# Patient Record
Sex: Female | Born: 1935 | Hispanic: No | Marital: Married | Smoking: Never smoker
Health system: Southern US, Community
[De-identification: ages and names within clinical notes are randomized; demographics above are authoritative.]

## PROBLEM LIST (undated history)

## (undated) DIAGNOSIS — F0390 Unspecified dementia without behavioral disturbance: Secondary | ICD-10-CM

## (undated) DIAGNOSIS — I639 Cerebral infarction, unspecified: Secondary | ICD-10-CM

---

## 2010-07-14 ENCOUNTER — Emergency Department (HOSPITAL_COMMUNITY): Admission: EM | Admit: 2010-07-14 | Discharge: 2010-07-14 | Payer: Self-pay | Admitting: Family Medicine

## 2010-08-04 ENCOUNTER — Ambulatory Visit: Payer: Self-pay | Admitting: Internal Medicine

## 2010-08-04 DIAGNOSIS — M1990 Unspecified osteoarthritis, unspecified site: Secondary | ICD-10-CM

## 2010-08-04 HISTORY — DX: Unspecified osteoarthritis, unspecified site: M19.90

## 2010-08-04 LAB — CONVERTED CEMR LAB
ALT: 12 units/L (ref 0–35)
AST: 23 units/L (ref 0–37)
Albumin: 4 g/dL (ref 3.5–5.2)
Alkaline Phosphatase: 99 units/L (ref 39–117)
Basophils Absolute: 0.1 10*3/uL (ref 0.0–0.1)
Basophils Relative: 1 % (ref 0–1)
Blood in Urine, dipstick: NEGATIVE
Calcium: 9.9 mg/dL (ref 8.4–10.5)
Chloride: 107 meq/L (ref 96–112)
Eosinophils Absolute: 0.4 10*3/uL (ref 0.0–0.7)
Glucose, Urine, Semiquant: NEGATIVE
MCHC: 33.7 g/dL (ref 30.0–36.0)
MCV: 86.1 fL (ref 78.0–100.0)
Neutro Abs: 3.5 10*3/uL (ref 1.7–7.7)
Neutrophils Relative %: 52 % (ref 43–77)
Nitrite: NEGATIVE
Platelets: 269 10*3/uL (ref 150–400)
Potassium: 4.5 meq/L (ref 3.5–5.3)
Protein, U semiquant: NEGATIVE
RBC: 4.59 M/uL (ref 3.87–5.11)
RDW: 13.5 % (ref 11.5–15.5)
Sodium: 139 meq/L (ref 135–145)
Urobilinogen, UA: 0.2
pH: 6.5

## 2010-11-21 NOTE — Assessment & Plan Note (Signed)
Summary: NEW PT LEG PAIN///MC   Vital Signs:  Patient profile:   77 year old female Height:      55.5 inches Weight:      114 pounds Temp:     97.7 degrees F Pulse rate:   75 / minute Pulse rhythm:   regular Resp:     18 per minute BP sitting:   132 / 72  (left arm) Cuff size:   regular  Vitals Entered By: Michelle Nasuti (August 04, 2010 10:15 AM) CC: PT C/O BILATERAL ANKLE PAIN Pain Assessment Patient in pain? yes       Does patient need assistance? Ambulation Normal   CC:  PT C/O BILATERAL ANKLE PAIN.  History of Present Illness: 77 yo female here to establish.  Son interprets.  Concerns:  1.  Bilateral ankle pain:  Has had for 5 years.  No history of injury from what information can be gathered.  See below.  2.  Bilateral Knee Pain:  Also for past 5 years.  Again, no history of acute injury.  Both knees and ankles swell if she walks after a bit.  No erythema.  No other joints bother her.  Has been to Urgent Care--in September.  No xrays done.  Not clear what diagnosis was given.  Was intiated on Tramadol and Naproxen--helps.  Tramadol, however, causes nausea and vomiting--occurs with each dose.  Naproxen alone does not control her pain.  3.  Knot on her head:  Has had for many years--has not enlarged with time.    Current Medications (verified): 1)  Naproxen 500 Mg Tabs (Naproxen) .Marland Kitchen.. 1i Two Times A Day 2)  Tramadol Hcl 50 Mg Tabs (Tramadol Hcl) .Marland Kitchen.. 1 Tab By Mouth Three Times A Day  Allergies (verified): No Known Drug Allergies  Past History:  Past Surgical History: None  Family History: Mother, died 65 yo, febrile illness Father, died 63 yo--no known health concerns Siblings: 3 Sisters and 1 Brother, all living:  one sister with arthritis 7 children:  Son,  71:  Hypertension--he sees Jesse Fall, FNP  Social History: Originally from [place] Lived in [place] for 20 years [place]oved [place] Has not yet been to PHD from what son states--but waiting  to hear about her appt. 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 3 children still in [place], rest of children here in [place]. Tobacco Use:  Hx of smoking--stopped before coming to [place]  Started smoking when 77 yo.  3 cigarettes daily previously Alcohol use:  none Drug use:  none.  Physical Exam  General:  NAD Lungs:  Normal respiratory effort, chest expands symmetrically. Lungs are clear to auscultation, no crackles or wheezes. Heart:  Normal rate and regular rhythm. S1 and S2 normal without gallop, murmur, click, rub or other extra sounds.  Radial pulses normal and equal Abdomen:  soft and non-tender.   Extremities:  Varus deformity of bilateral knees with hypertrophic changes bilaterally  Mild antermedial joint line pain, just adjacent to patella on left.  No effusion.  Full ROM.  No ligamentous laxity  NT about ankles with full ROM and no effusion.   Impression & Recommendations:  Problem # 1:  OSTEOARTHRITIS (ICD-715.90) Stop Tramadol with GI upset. To take Naproxen with food. May take Tylenol in between doses for breakthrough pain The following medications were removed from the medication list:    Tramadol Hcl 50 Mg Tabs (Tramadol hcl) .Marland Kitchen... 1 tab by mouth three times  a day Her updated medication list for this problem includes:    Naproxen 500 Mg Tabs (Naproxen) .Marland Kitchen... 1 tab by mouth two times a day with food.  Problem # 2:  Preventive Health Care (ICD-V70.0) Flu vaccine  Complete Medication List: 1)  Naproxen 500 Mg Tabs (Naproxen) .Marland Kitchen.. 1 tab by mouth two times a day with food.  Other Orders: UA Dipstick w/o Micro (manual) (81191) T-Comprehensive Metabolic Panel [phone]) T-CBC No Diff ([id number]) Influenza Vaccine NON MCR (29528)  Patient Instructions: 1)  May take Tylenol 500 mg up to 3 times daily in between Naproxen if increased pain.  Should keep Tylenol 4 hours apart with dosing. 2)  CPP with Dr. Delrae Alfred in next 4  months Prescriptions: NAPROXEN 500 MG TABS (NAPROXEN) 1 tab by mouth two times a day with food.  #60 x 4   Entered and Authorized by:   Julieanne Manson MD   Signed by:   Julieanne Manson MD on 08/04/2010   Method used:   Electronically to        [facility] [phone]* (retail)       [address]       [place], [place]  [address]       Ph: [phone]       Fax: [phone]   RxID:   [id number]   Laboratory Results   Urine Tests  Date/Time Received: August 04, 2010 Date/Time Reported: 12:35 PM  Routine Urinalysis   Color: lt. yellow Appearance: Clear Glucose: negative   (Normal Range: Negative) Bilirubin: negative   (Normal Range: Negative) Ketone: negative   (Normal Range: Negative) Spec. Gravity: <1.005   (Normal Range: 1.003-1.035) Blood: negative   (Normal Range: Negative) pH: 6.5   (Normal Range: 5.0-8.0) Protein: negative   (Normal Range: Negative) Urobilinogen: 0.2   (Normal Range: 0-1) Nitrite: negative   (Normal Range: Negative) Leukocyte Esterace: negative   (Normal Range: Negative)         Influenza Vaccine    Vaccine Type: Fluvax Non-MCR    Site: right deltoid    Mfr: GlaxoSmithKline    Dose: 0.5 ml    Route: IM    Given by: Michelle Nasuti    Exp. Date: 04/21/2011    Lot #: [id number]    VIS given: 05/16/10 version given August 04, 2010.  Flu Vaccine Consent Questions    Do you have a history of severe allergic reactions to this vaccine? no    Any prior history of allergic reactions to egg and/or gelatin? no    Do you have a sensitivity to the preservative Thimersol? no    Do you have a past history of Guillan-Barre Syndrome? no    Do you currently have an acute febrile illness? no    Have you ever had a severe reaction to latex? no    Vaccine information given and explained to patient? yes    Are you currently pregnant? no

## 2011-02-24 ENCOUNTER — Inpatient Hospital Stay (INDEPENDENT_AMBULATORY_CARE_PROVIDER_SITE_OTHER)
Admission: RE | Admit: 2011-02-24 | Discharge: 2011-02-24 | Disposition: A | Discharge status: Home / Self Care | Payer: Medicaid Other | Source: Ambulatory Visit | Attending: Family Medicine | Admitting: Family Medicine

## 2011-02-24 DIAGNOSIS — M79609 Pain in unspecified limb: Secondary | ICD-10-CM

## 2011-11-23 ENCOUNTER — Emergency Department (INDEPENDENT_AMBULATORY_CARE_PROVIDER_SITE_OTHER)
Admission: EM | Admit: 2011-11-23 | Discharge: 2011-11-23 | Disposition: A | Discharge status: Home / Self Care | Payer: Medicaid Other | Source: Home / Self Care | Attending: Family Medicine | Admitting: Family Medicine

## 2011-11-23 ENCOUNTER — Emergency Department (INDEPENDENT_AMBULATORY_CARE_PROVIDER_SITE_OTHER): Payer: Medicaid Other

## 2011-11-23 ENCOUNTER — Encounter (HOSPITAL_COMMUNITY): Payer: Self-pay | Admitting: Emergency Medicine

## 2011-11-23 DIAGNOSIS — J069 Acute upper respiratory infection, unspecified: Secondary | ICD-10-CM

## 2011-11-23 DIAGNOSIS — M7551 Bursitis of right shoulder: Secondary | ICD-10-CM

## 2011-11-23 DIAGNOSIS — M67919 Unspecified disorder of synovium and tendon, unspecified shoulder: Secondary | ICD-10-CM

## 2011-11-23 DIAGNOSIS — M719 Bursopathy, unspecified: Secondary | ICD-10-CM

## 2011-11-23 MED ORDER — GUAIFENESIN-CODEINE 100-10 MG/5ML PO SYRP: 5.0000 mL | ORAL_SOLUTION | Freq: Three times a day (TID) | ORAL | Status: AC | PRN

## 2011-11-23 MED ORDER — DICLOFENAC SODIUM 1 % TD GEL: 1.0000 "application " | Freq: Four times a day (QID) | TRANSDERMAL | Status: DC

## 2011-11-23 NOTE — ED Provider Notes (Signed)
History     CSN: [id number]  Arrival date & time 11/23/11  1025   First MD Initiated Contact with Patient 11/23/11 1109      Chief Complaint  Patient presents with  . Cough  . Shoulder Pain    (Consider location/radiation/quality/duration/timing/severity/associated sxs/prior treatment) Patient is a 78 y.o. female presenting with cough and shoulder pain. The history is provided by the patient. The history is limited by a language barrier. A language interpreter was used.  Cough This is a new problem. The current episode started more than 1 week ago. The problem occurs constantly. The problem has not changed since onset.The cough is non-productive. There has been no fever. Associated symptoms include rhinorrhea, sore throat and myalgias. Pertinent negatives include no chest pain. She is not a smoker.  Shoulder Pain This is a chronic problem. The current episode started more than 1 week ago (5 months). The problem occurs constantly. The problem has not changed since onset.Pertinent negatives include no chest pain and no abdominal pain.    History reviewed. No pertinent past medical history.  History reviewed. No pertinent past surgical history.  No family history on file.  History  Substance Use Topics  . Smoking status: Never Smoker   . Smokeless tobacco: Not on file  . Alcohol Use: No    OB History    Grav Para Term Preterm Abortions TAB SAB Ect Mult Living                  Review of Systems  Constitutional: Negative.   HENT: Positive for congestion, sore throat, rhinorrhea and postnasal drip.   Respiratory: Positive for cough.   Cardiovascular: Negative for chest pain.  Gastrointestinal: Negative.  Negative for abdominal pain.  Musculoskeletal: Positive for myalgias and joint swelling.    Allergies  Review of patient's allergies indicates no known allergies.  Home Medications   Current Outpatient Rx  Name Route Sig Dispense Refill  . DICLOFENAC SODIUM 1 % TD  GEL Topical Apply 1 application topically 4 (four) times daily. 4gm per dose 100 g 3  . GUAIFENESIN-CODEINE 100-10 MG/5ML PO SYRP Oral Take 5 mLs by mouth 3 (three) times daily as needed for cough. 120 mL 0    BP 153/90  Pulse 79  Temp(Src) 98.1 F (36.7 C) (Oral)  Resp 20  SpO2 96%  Physical Exam  Nursing note and vitals reviewed. Constitutional: She is oriented to person, place, and time. She appears well-developed and well-nourished.  HENT:  Head: Normocephalic.  Right Ear: External ear normal.  Left Ear: External ear normal.  Mouth/Throat: Oropharynx is clear and moist.  Eyes: Pupils are equal, round, and reactive to light.  Neck: Normal range of motion. Neck supple.  Cardiovascular: Normal rate, regular rhythm, normal heart sounds and intact distal pulses.   Pulmonary/Chest: Effort normal and breath sounds normal.  Musculoskeletal: She exhibits tenderness. She exhibits no edema.       Right shoulder: She exhibits decreased range of motion and tenderness. She exhibits no swelling, no effusion and no crepitus.  Lymphadenopathy:    She has no cervical adenopathy.  Neurological: She is alert and oriented to person, place, and time.  Skin: Skin is warm and dry.    ED Course  Procedures (including critical care time)  Labs Reviewed - No data to display Dg Chest 2 View  11/23/2011  *RADIOLOGY REPORT*  Clinical Data: Cough for 1 month.  No shortness of breath or fever.  CHEST - 2 VIEW  Comparison: None.  Findings: Low volume chest.    No airspace disease or effusion identified.  No cavitary lesions are identified.  Tortuous thoracic aorta.  Cardiomegaly is present.  Age indeterminant thoracolumbar junction compression fracture identified on the lateral view. Per CMS PQRS reporting requirements (PQRS Measure 24): Given the patient's age of greater than 50 and the fracture site (hip, distal radius, or spine), the patient should be tested for osteoporosis using DXA, and the appropriate  treatment considered based on the DXA results.  IMPRESSION: Cardiomegaly and low volume chest.  No acute cardiopulmonary disease.  Original Report Authenticated By: Andreas Newport, M.D.   Dg Shoulder Right  11/23/2011  *RADIOLOGY REPORT*  Clinical Data: Right shoulder pain for 5 months.  No injury.  RIGHT SHOULDER - 2+ VIEW  Comparison: None.  Findings: Visualized right chest demonstrates basilar atelectasis. AC joint appears within normal limits.  Internal and external rotation views of the right shoulder appear normal.  The scapular Y view is oblique.  There is no fracture identified.  IMPRESSION: No acute osseous abnormality.  Original Report Authenticated By: Andreas Newport, M.D.     1. URI (upper respiratory infection)   2. Bursitis of shoulder, right       MDM  X-rays reviewed and report per radiologist.         Barkley Bruns, MD 11/23/11 [phone]

## 2011-11-23 NOTE — ED Notes (Signed)
Pt here with dry cough,chills and sore throat that started x 1 mnth and right shoulder pain x 5 mnths according to [place] translation line for [place] language.pt has been in [place] since June 2012

## 2012-01-01 ENCOUNTER — Emergency Department (HOSPITAL_COMMUNITY): Payer: Medicaid Other

## 2012-01-01 ENCOUNTER — Other Ambulatory Visit: Payer: Self-pay

## 2012-01-01 ENCOUNTER — Emergency Department (HOSPITAL_COMMUNITY)
Admission: EM | Admit: 2012-01-01 | Discharge: 2012-01-01 | Disposition: A | Discharge status: Home / Self Care | Payer: Medicaid Other | Attending: Emergency Medicine | Admitting: Emergency Medicine

## 2012-01-01 ENCOUNTER — Encounter (HOSPITAL_COMMUNITY): Payer: Self-pay | Admitting: Family Medicine

## 2012-01-01 DIAGNOSIS — W19XXXA Unspecified fall, initial encounter: Secondary | ICD-10-CM | POA: Insufficient documentation

## 2012-01-01 DIAGNOSIS — S20229A Contusion of unspecified back wall of thorax, initial encounter: Secondary | ICD-10-CM

## 2012-01-01 DIAGNOSIS — M549 Dorsalgia, unspecified: Secondary | ICD-10-CM | POA: Insufficient documentation

## 2012-01-01 DIAGNOSIS — J3489 Other specified disorders of nose and nasal sinuses: Secondary | ICD-10-CM | POA: Insufficient documentation

## 2012-01-01 DIAGNOSIS — Z79899 Other long term (current) drug therapy: Secondary | ICD-10-CM | POA: Insufficient documentation

## 2012-01-01 DIAGNOSIS — N390 Urinary tract infection, site not specified: Secondary | ICD-10-CM | POA: Insufficient documentation

## 2012-01-01 DIAGNOSIS — R229 Localized swelling, mass and lump, unspecified: Secondary | ICD-10-CM | POA: Insufficient documentation

## 2012-01-01 DIAGNOSIS — R55 Syncope and collapse: Secondary | ICD-10-CM | POA: Insufficient documentation

## 2012-01-01 DIAGNOSIS — S5000XA Contusion of unspecified elbow, initial encounter: Secondary | ICD-10-CM | POA: Insufficient documentation

## 2012-01-01 LAB — URINALYSIS, ROUTINE W REFLEX MICROSCOPIC
Bilirubin Urine: NEGATIVE
Glucose, UA: NEGATIVE mg/dL
Hgb urine dipstick: NEGATIVE
Ketones, ur: NEGATIVE mg/dL
Nitrite: NEGATIVE
Protein, ur: 30 mg/dL — AB
Specific Gravity, Urine: 1.01 (ref 1.005–1.030)
Urobilinogen, UA: 0.2 mg/dL (ref 0.0–1.0)
pH: 6.5 (ref 5.0–8.0)

## 2012-01-01 LAB — URINE MICROSCOPIC-ADD ON

## 2012-01-01 MED ORDER — OXYCODONE-ACETAMINOPHEN 5-325 MG PO TABS
2.0000 | ORAL_TABLET | Freq: Once | ORAL | Status: AC
  Administered 2012-01-01: 2 via ORAL
  Filled 2012-01-01: qty 2

## 2012-01-01 MED ORDER — NITROFURANTOIN MONOHYD MACRO 100 MG PO CAPS
100.0000 mg | ORAL_CAPSULE | Freq: Once | ORAL | Status: AC
  Administered 2012-01-01: 100 mg via ORAL
  Filled 2012-01-01: qty 1

## 2012-01-01 MED ORDER — NITROFURANTOIN MONOHYD MACRO 100 MG PO CAPS: 100.0000 mg | ORAL_CAPSULE | Freq: Two times a day (BID) | ORAL | Status: AC

## 2012-01-01 MED ORDER — SODIUM CHLORIDE 0.9 % IV SOLN
INTRAVENOUS | Status: DC
  Administered 2012-01-01: 11:00:00 via INTRAVENOUS

## 2012-01-01 MED ORDER — OXYCODONE-ACETAMINOPHEN 5-325 MG PO TABS: 1.0000 | ORAL_TABLET | Freq: Four times a day (QID) | ORAL | Status: AC | PRN

## 2012-01-01 MED ORDER — MORPHINE SULFATE 4 MG/ML IJ SOLN
4.0000 mg | Freq: Once | INTRAMUSCULAR | Status: AC
  Administered 2012-01-01: 4 mg via INTRAVENOUS
  Filled 2012-01-01: qty 1

## 2012-01-01 MED ORDER — NAPROXEN 500 MG PO TABS: 500.0000 mg | ORAL_TABLET | Freq: Two times a day (BID) | ORAL | Status: AC

## 2012-01-01 NOTE — ED Notes (Signed)
Placed on  bedpan

## 2012-01-01 NOTE — ED Provider Notes (Signed)
History     CSN: [id number]  Arrival date & time 01/01/12  7846   First MD Initiated Contact with Patient 01/01/12 1000      Chief Complaint  Patient presents with  . Fall    (Consider location/radiation/quality/duration/timing/severity/associated sxs/prior treatment) HPI Comments: Also a nonthrombosed. Sounds like it was a mechanical fall. There was question as to whether she had some mild near-syncope and mild headache prior to fall. No chest pain, shortness breath, palpitations.  Patient is a 78 y.o. female presenting with fall. The history is provided by the patient. The history is limited by a language barrier. A language interpreter was used.  Fall The accident occurred less than 1 hour ago. The fall occurred while walking (boarding a bus). She fell from a height of 1 to 2 ft. She landed on concrete. There was no blood loss. The point of impact was the right elbow (back). Pain location: back and R elbow. The pain is moderate. She was ambulatory at the scene. There was no entrapment after the fall. There was no drug use involved in the accident. There was no alcohol use involved in the accident. Pertinent negatives include no visual change, no fever, no numbness, no abdominal pain, no bowel incontinence, no nausea, no vomiting, no headaches and no loss of consciousness. Treatment on scene includes a backboard.    History reviewed. No pertinent past medical history.  History reviewed. No pertinent past surgical history.  History reviewed. No pertinent family history.  History  Substance Use Topics  . Smoking status: Never Smoker   . Smokeless tobacco: Not on file  . Alcohol Use: No    OB History    Grav Para Term Preterm Abortions TAB SAB Ect Mult Living                  Review of Systems  Constitutional: Negative for fever, activity change, appetite change and fatigue.  HENT: Negative for congestion, sore throat, rhinorrhea, neck pain and neck stiffness.     Respiratory: Negative for cough and shortness of breath.   Cardiovascular: Negative for chest pain and palpitations.  Gastrointestinal: Negative for nausea, vomiting, abdominal pain and bowel incontinence.  Genitourinary: Negative for dysuria, urgency, frequency and flank pain.  Musculoskeletal: Positive for back pain and arthralgias. Negative for myalgias.  Neurological: Negative for dizziness, loss of consciousness, syncope, weakness, light-headedness, numbness and headaches.  All other systems reviewed and are negative.    Allergies  Review of patient's allergies indicates no known allergies.  Home Medications   Current Outpatient Rx  Name Route Sig Dispense Refill  . DICLOFENAC SODIUM 1 % TD GEL Topical Apply 1 application topically 4 (four) times daily. 4gm per dose 100 g 3  . NAPROXEN 500 MG PO TABS Oral Take 1 tablet (500 mg total) by mouth 2 (two) times daily. 30 tablet 0  . NITROFURANTOIN MONOHYD MACRO 100 MG PO CAPS Oral Take 1 capsule (100 mg total) by mouth 2 (two) times daily. 10 capsule 0  . OXYCODONE-ACETAMINOPHEN 5-325 MG PO TABS Oral Take 1-2 tablets by mouth every 6 (six) hours as needed for pain. 15 tablet 0    BP 143/77  Pulse 79  Temp(Src) 98.3 F (36.8 C) (Oral)  Resp 24  SpO2 96%  Physical Exam  Nursing note and vitals reviewed. Constitutional: She is oriented to person, place, and time. She appears well-developed and well-nourished.       Appears uncomfortable  HENT:  Head: Normocephalic and atraumatic.  Mouth/Throat: Oropharynx is clear and moist.  Eyes: Conjunctivae and EOM are normal. Pupils are equal, round, and reactive to light.  Neck: Normal range of motion. Neck supple.  Cardiovascular: Normal rate, regular rhythm, normal heart sounds and intact distal pulses.  Exam reveals no gallop and no friction rub.   No murmur heard. Pulmonary/Chest: Effort normal and breath sounds normal. No respiratory distress. She exhibits no tenderness.   Abdominal: Soft. Bowel sounds are normal. There is no tenderness. There is no rebound and no guarding.  Musculoskeletal:       Right elbow: She exhibits decreased range of motion. She exhibits no swelling and no deformity. tenderness (diffusely) found.       Thoracic back: She exhibits tenderness, bony tenderness and pain. She exhibits no spasm.       Lumbar back: She exhibits tenderness, bony tenderness and pain. She exhibits no spasm.       Full rom distally.  Strength 5/5 in all ext.  Reduced rom R elbow.  Strong radial pulses  Neurological: She is alert and oriented to person, place, and time. No cranial nerve deficit.  Skin: Skin is warm and dry. No rash noted.    ED Course  Procedures (including critical care time)   Date: 01/01/2012  Rate: 68  Rhythm: normal sinus rhythm  QRS Axis: normal  Intervals: normal  ST/T Wave abnormalities: normal  Conduction Disutrbances:none  Narrative Interpretation:   Old EKG Reviewed: none available  Labs Reviewed  URINALYSIS, ROUTINE W REFLEX MICROSCOPIC - Abnormal; Notable for the following:    Protein, ur 30 (*)    Leukocytes, UA LARGE (*)    All other components within normal limits  URINE MICROSCOPIC-ADD ON - Abnormal; Notable for the following:    Squamous Epithelial / LPF FEW (*)    Bacteria, UA FEW (*)    All other components within normal limits   Dg Thoracic Spine 2 View  01/01/2012  *RADIOLOGY REPORT*  Clinical Data: Larey Seat.  No reported spine symptoms.  THORACIC SPINE - 2 VIEW  Comparison: Chest dated 11/23/2011.  Findings: Stable 50% T12 vertebral compression deformity.  No acute fracture or subluxation.  Diffuse osteopenia.  IMPRESSION: Stable 50% T12 compression deformity.  No acute fracture.  Per CMS PQRS reporting requirements (PQRS Measure 24): Given the patient's age of greater than 50 and the fracture site (hip, distal radius, or spine), the patient should be tested for osteoporosis using DXA, and the appropriate treatment  considered based on the DXA results.  Original Report Authenticated By: Darrol Angel, M.D.   Dg Lumbar Spine Complete  01/01/2012  *RADIOLOGY REPORT*  Clinical Data: Larey Seat.  No reported spine symptoms.  LUMBAR SPINE - COMPLETE 4+ VIEW  Comparison: Thoracic spine radiographs obtained at the same time.  Findings: Five non-rib bearing lumbar vertebrae.  Minimal dextroconvex rotary scoliosis.  Previously described old T12 compression deformity.  No acute fracture or subluxation.  Mild spur formation at multiple levels.  Atheromatous aortic calcifications.  IMPRESSION:  1.  Previously described old T12 compression deformity. 2.  No acute fracture or subluxation. 3.  Mild degenerative changes.  Original Report Authenticated By: Darrol Angel, M.D.   Dg Elbow Complete Right  01/01/2012  *RADIOLOGY REPORT*  Clinical Data: Fall  RIGHT ELBOW - COMPLETE 3+ VIEW  Comparison: 11/23/2011  Findings: There is no joint effusion.  There is no fracture or subluxation identified.  There are no radio-opaque foreign bodies or soft tissue calcification identified.  No significant arthropathy.  IMPRESSION:  1.  No acute findings.  Original Report Authenticated By: Rosealee Albee, M.D.   Ct Head Wo Contrast  01/01/2012  *RADIOLOGY REPORT*  Clinical Data: Status post fall  CT HEAD WITHOUT CONTRAST  Technique:  Contiguous axial images were obtained from the base of the skull through the vertex without contrast.  Comparison: None  Findings: The brain has a normal appearance without evidence for hemorrhage, infarction, hydrocephalus, or mass lesion.  There is no extra axial fluid collection.  Bubbly opacification of the sphenoid sinus is identified.  The mastoid air cells are clear. The skull is intact.  Within the right scalp there is a subcutaneous nodule which is benign appearing and partially calcified measuring 1 cm, image 21.  IMPRESSION:  1.  No acute intracranial abnormalities. 2.  Mild sphenoid sinus opacification.   Original Report Authenticated By: Rosealee Albee, M.D.     1. UTI (lower urinary tract infection)   2. Fall   3. Elbow contusion   4. Back contusion       MDM  Imaging negative. Consistent with a contusion to the back. She has an old T12 deformity nothing acute. Elbows negative for fracture. Improved after pain medication. Mild urinary tract infection. Will be discharged home with instructions to followup with her primary care physician. Discharged home with Macrobid and pain control. Given apply ice the affected areas.  Provided signs for return        Dayton Bailiff, MD 01/01/12 1420

## 2012-01-01 NOTE — Progress Notes (Signed)
No pcp listed spoke with pt's female family member at bedside.  No listed PCP on pt Medicaid card.  Pt unable to recall name of pcp seen.  Cm discussed importance of pcp with need to have DSS assist with entering chosen pcp in Medicaid system so pt can be seen Female voiced understanding provided with a list of medicaid local pcps

## 2012-01-01 NOTE — ED Notes (Signed)
Off floor for testing 

## 2012-01-01 NOTE — ED Notes (Signed)
Family at bedside for interpretation

## 2012-01-01 NOTE — ED Notes (Signed)
Followed up with patient. Patient still unable to void. RN Stacy aware.

## 2012-01-01 NOTE — ED Notes (Addendum)
Per EMS: Pt was getting off the bus and slipped and fell onto the concrete.  Denies loc. Reports right hand and back pain. Pt on LSB and head blocks. EMS states they could not find a c-collar that would fit the pt.  NAD noted

## 2012-01-01 NOTE — Discharge Instructions (Signed)

## 2012-01-01 NOTE — ED Notes (Signed)
Pt. and family aware need urine sample. Unable to void at this time; advised to call once able to.

## 2012-01-01 NOTE — ED Notes (Signed)
AVW:[id number]<XB> Expected date:01/01/12<BR> Expected time: 9:51 AM<BR> Means of arrival:Ambulance<BR> Comments:<BR> Fall

## 2012-04-06 ENCOUNTER — Encounter (HOSPITAL_COMMUNITY): Payer: Self-pay | Admitting: *Deleted

## 2012-04-06 ENCOUNTER — Emergency Department (HOSPITAL_COMMUNITY)
Admission: EM | Admit: 2012-04-06 | Discharge: 2012-04-06 | Disposition: A | Discharge status: Home / Self Care | Payer: Medicaid Other | Source: Home / Self Care | Attending: Family Medicine | Admitting: Family Medicine

## 2012-04-06 ENCOUNTER — Emergency Department (INDEPENDENT_AMBULATORY_CARE_PROVIDER_SITE_OTHER): Payer: Medicaid Other

## 2012-04-06 DIAGNOSIS — J069 Acute upper respiratory infection, unspecified: Secondary | ICD-10-CM

## 2012-04-06 LAB — POCT RAPID STREP A: Streptococcus, Group A Screen (Direct): NEGATIVE

## 2012-04-06 MED ORDER — BENZONATATE 100 MG PO CAPS: 100.0000 mg | ORAL_CAPSULE | Freq: Three times a day (TID) | ORAL | Status: AC

## 2012-04-06 MED ORDER — GUAIFENESIN-CODEINE 400-10 MG PO TABS: 1.0000 | ORAL_TABLET | Freq: Four times a day (QID) | ORAL | Status: DC | PRN

## 2012-04-06 NOTE — Discharge Instructions (Signed)
No signs of pneumonia in the x-rays. Rapid strep test is negative. Is likely you have a viral respiratory infection. Is very important top keep well hydrated. Take the prescribed medications as instructed. Take ibuprofen over-the-counter 200 mg scheduled every 8 hours for the next 24-48 hours take with food and plenty of liquids as it can upset your stomach, can Tylenol over-the-counter 500 mg every 6 hours as needed for fever or pain Use nasal saline spray at least 3 times a day. (simply saline is over the counter) Return if difficulty breathing or not keeping fluids down.

## 2012-04-06 NOTE — ED Notes (Signed)
TRANSLATED PER SON, PT CO COUGH WITH HA, SORETHROAT SINCE YESTERDAY, DENIES FEVER, HAS NOT TAKEN ANY OTC MEDS.

## 2012-04-06 NOTE — ED Provider Notes (Signed)
History     CSN: [id number]  Arrival date & time 04/06/12  1525   First MD Initiated Contact with Patient 04/06/12 1543      Chief Complaint  Patient presents with  . Cough    (Consider location/radiation/quality/duration/timing/severity/associated sxs/prior treatment) HPI Comments: 78 y/o female no significant PMH here c/o persistent cough, nasal congestion, sore throat , headache and general malaise for 2 days. Subjective fever. Denies shortness of breath or chest pain. Denies chills or rigors. No nausea vomiting or diarrhea. Decreased appetite but keeping fluids well.   History reviewed. No pertinent past medical history.  History reviewed. No pertinent past surgical history.  History reviewed. No pertinent family history.  History  Substance Use Topics  . Smoking status: Never Smoker   . Smokeless tobacco: Not on file  . Alcohol Use: No    OB History    Grav Para Term Preterm Abortions TAB SAB Ect Mult Living                  Review of Systems  Constitutional: Positive for fever and appetite change. Negative for chills.  HENT: Positive for sore throat and rhinorrhea. Negative for ear pain, trouble swallowing, neck pain and neck stiffness.   Respiratory: Positive for cough. Negative for shortness of breath.   Cardiovascular: Negative for chest pain and leg swelling.  Gastrointestinal: Negative for nausea, vomiting, abdominal pain and diarrhea.  Genitourinary: Negative for dysuria.  Skin: Negative for rash.  Neurological: Negative for dizziness and headaches.    Allergies  Review of patient's allergies indicates no known allergies.  Home Medications   Current Outpatient Rx  Name Route Sig Dispense Refill  . BENZONATATE 100 MG PO CAPS Oral Take 1 capsule (100 mg total) by mouth every 8 (eight) hours. 21 capsule 0  . DICLOFENAC SODIUM 1 % TD GEL Topical Apply 1 application topically 4 (four) times daily. 4gm per dose 100 g 3  . GUAIFENESIN-CODEINE 400-10 MG PO  TABS Oral Take 1 tablet by mouth 4 (four) times daily as needed. 20 each 0  . NAPROXEN 500 MG PO TABS Oral Take 1 tablet (500 mg total) by mouth 2 (two) times daily. 30 tablet 0    BP 140/93  Pulse 108  Temp 99.5 F (37.5 C) (Oral)  Resp 18  SpO2 97%  Physical Exam  Nursing note and vitals reviewed. Constitutional: She is oriented to person, place, and time. She appears well-developed and well-nourished. No distress.  HENT:  Head: Normocephalic and atraumatic.  Right Ear: External ear normal.  Left Ear: External ear normal.       Nasal Congestion with erythema and swelling of nasal turbinates, clear rhinorrhea. Pharyngeal erythema no exudates. No uvula deviation. No trismus. TM's with increased vascular markings and some dullness bilaterally no swelling or bulging   Eyes: Conjunctivae and EOM are normal. Pupils are equal, round, and reactive to light. Right eye exhibits no discharge. Left eye exhibits no discharge.  Neck: Normal range of motion. Neck supple. No JVD present.  Cardiovascular: Normal rate, regular rhythm and normal heart sounds.  Exam reveals no friction rub.   No murmur heard. Pulmonary/Chest: Effort normal. No respiratory distress. She has no wheezes. She has no rales. She exhibits no tenderness.       Decreased breath sound at bases bilaterally.  Lymphadenopathy:    She has no cervical adenopathy.  Neurological: She is alert and oriented to person, place, and time.  Skin: No rash noted.  ED Course  Procedures (including critical care time)   Labs Reviewed  POCT RAPID STREP A ([facility] CARE ONLY)  LAB REPORT - SCANNED   Dg Chest 2 View  04/06/2012  *RADIOLOGY REPORT*  Clinical Data: History of cough.  CHEST - 2 VIEW  Comparison: Chest x-ray 11/23/2011.  Findings: Lung volumes are low.  There are bibasilar opacities favored to represent areas of subsegmental atelectasis.  No definite acute consolidative airspace disease.  No pleural effusions.  Mild diffuse  interstitial prominence is similar to the prior study appears to be chronic in this patient.  Mild pulmonary vascular crowding, accentuated by the patient's low lung volumes. No frank pulmonary edema.  Heart size is borderline enlarged. Mediastinal contours are unremarkable.  Atherosclerotic calcifications within the arch of the aorta.  Lateral view again demonstrates two lower thoracic spine compression fractures (likely T11 and T12), which appear unchanged compared to prior study from 11/23/2011.  This is associated with a mild acute kyphotic deformity of the lower thoracic spine.  IMPRESSION: 1.  Low lung volumes with bibasilar subsegmental atelectasis. 2.  Borderline cardiomegaly. 3.  Atherosclerosis. 4.  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  Original Report Authenticated By: Florencia Reasons, M.D.     1. URI, acute       MDM  Rapid strep negative. No signs of consolidation on X rays. Treated symptomatically. Asked to return if worsening or persistent symptoms despite following treatment.          Sharin Grave, MD 04/07/12 1056

## 2019-04-04 DIAGNOSIS — R058 Other specified cough: Secondary | ICD-10-CM | POA: Insufficient documentation

## 2019-04-04 DIAGNOSIS — J4520 Mild intermittent asthma, uncomplicated: Secondary | ICD-10-CM

## 2019-04-04 HISTORY — DX: Mild intermittent asthma, uncomplicated: J45.20

## 2020-03-02 ENCOUNTER — Ambulatory Visit: Payer: Medicare Other

## 2020-03-02 NOTE — Progress Notes (Signed)
   Covid-19 Vaccination Clinic  Name:  Nikitia Asbill    MRN: [id number] DOB: 10-Dec-1932  03/02/2020  Ms. Santiago was observed post Covid-19 immunization for 15 minutes without incident. She was provided with Vaccine Information Sheet and instruction to access the V-Safe system.   Ms. Teagarden was instructed to call 911 with any severe reactions post vaccine: Marland Kitchen Difficulty breathing  . Swelling of face and throat  . A fast heartbeat  . A bad rash all over body  . Dizziness and weakness   Immunizations Administered    Name Date Dose VIS Date Route   Pfizer COVID-19 Vaccine 03/02/2020 12:24 PM 0.3 mL 12/16/2018 Intramuscular   Manufacturer: [employer], [employer]   Lot: [id number]   NDC: [address]-4

## 2020-03-28 ENCOUNTER — Ambulatory Visit: Payer: Medicare Other | Attending: Internal Medicine

## 2020-03-28 DIAGNOSIS — Z23 Encounter for immunization: Secondary | ICD-10-CM

## 2020-03-28 NOTE — Progress Notes (Signed)
   Covid-19 Vaccination Clinic  Name:  Yolanda [name]    MRN: [id number] DOB: 24-Sep-1933  03/28/2020  Yolanda [name] was observed post Covid-19 immunization for 15 minutes without incident. She was provided with Vaccine Information Sheet and instruction to access the V-Safe system.   Yolanda [name] was instructed to call 911 with any severe reactions post vaccine: Marland Kitchen Difficulty breathing  . Swelling of face and throat  . A fast heartbeat  . A bad rash all over body  . Dizziness and weakness   Immunizations Administered    Name Date Dose VIS Date Route   Pfizer COVID-19 Vaccine 03/28/2020  1:59 PM 0.3 mL 12/16/2018 Intramuscular   Manufacturer: [employer], [employer]   Lot: [id number]   NDC: [address]-7

## 2020-10-31 DIAGNOSIS — S52502D Unspecified fracture of the lower end of left radius, subsequent encounter for closed fracture with routine healing: Secondary | ICD-10-CM

## 2020-10-31 HISTORY — DX: Unspecified fracture of the lower end of left radius, subsequent encounter for closed fracture with routine healing: S52.502D

## 2021-03-20 ENCOUNTER — Emergency Department (HOSPITAL_COMMUNITY): Payer: Medicare Other

## 2021-03-20 ENCOUNTER — Emergency Department (HOSPITAL_COMMUNITY)
Admission: EM | Admit: 2021-03-20 | Discharge: 2021-03-21 | Disposition: A | Discharge status: Home / Self Care | Payer: Medicare Other | Attending: Emergency Medicine | Admitting: Emergency Medicine

## 2021-03-20 ENCOUNTER — Encounter (HOSPITAL_COMMUNITY): Payer: Self-pay

## 2021-03-20 ENCOUNTER — Other Ambulatory Visit: Payer: Self-pay

## 2021-03-20 DIAGNOSIS — F0390 Unspecified dementia without behavioral disturbance: Secondary | ICD-10-CM | POA: Diagnosis not present

## 2021-03-20 DIAGNOSIS — S42212A Unspecified displaced fracture of surgical neck of left humerus, initial encounter for closed fracture: Secondary | ICD-10-CM | POA: Diagnosis not present

## 2021-03-20 DIAGNOSIS — Y92009 Unspecified place in unspecified non-institutional (private) residence as the place of occurrence of the external cause: Secondary | ICD-10-CM | POA: Insufficient documentation

## 2021-03-20 DIAGNOSIS — W108XXA Fall (on) (from) other stairs and steps, initial encounter: Secondary | ICD-10-CM | POA: Diagnosis not present

## 2021-03-20 DIAGNOSIS — I671 Cerebral aneurysm, nonruptured: Secondary | ICD-10-CM | POA: Diagnosis not present

## 2021-03-20 DIAGNOSIS — W19XXXA Unspecified fall, initial encounter: Secondary | ICD-10-CM

## 2021-03-20 DIAGNOSIS — S0083XA Contusion of other part of head, initial encounter: Secondary | ICD-10-CM | POA: Insufficient documentation

## 2021-03-20 DIAGNOSIS — S42202A Unspecified fracture of upper end of left humerus, initial encounter for closed fracture: Secondary | ICD-10-CM

## 2021-03-20 DIAGNOSIS — S0990XA Unspecified injury of head, initial encounter: Secondary | ICD-10-CM | POA: Diagnosis present

## 2021-03-20 HISTORY — DX: Unspecified dementia, unspecified severity, without behavioral disturbance, psychotic disturbance, mood disturbance, and anxiety: F03.90

## 2021-03-20 LAB — CBC WITH DIFFERENTIAL/PLATELET
Abs Immature Granulocytes: 0.05 10*3/uL (ref 0.00–0.07)
Basophils Absolute: 0 10*3/uL (ref 0.0–0.1)
Basophils Relative: 1 %
Eosinophils Absolute: 0.4 10*3/uL (ref 0.0–0.5)
Eosinophils Relative: 5 %
HCT: 42.4 % (ref 36.0–46.0)
Hemoglobin: 13.8 g/dL (ref 12.0–15.0)
Immature Granulocytes: 1 %
Lymphocytes Relative: 23 %
Lymphs Abs: 1.9 10*3/uL (ref 0.7–4.0)
MCH: 29 pg (ref 26.0–34.0)
MCHC: 32.5 g/dL (ref 30.0–36.0)
MCV: 89.1 fL (ref 80.0–100.0)
Monocytes Absolute: 0.6 10*3/uL (ref 0.1–1.0)
Monocytes Relative: 7 %
Neutro Abs: 5.5 10*3/uL (ref 1.7–7.7)
Neutrophils Relative %: 63 %
Platelets: 194 10*3/uL (ref 150–400)
RBC: 4.76 MIL/uL (ref 3.87–5.11)
RDW: 13.5 % (ref 11.5–15.5)
WBC: 8.5 10*3/uL (ref 4.0–10.5)
nRBC: 0 % (ref 0.0–0.2)

## 2021-03-20 LAB — BASIC METABOLIC PANEL
Anion gap: 8 (ref 5–15)
BUN: 11 mg/dL (ref 8–23)
CO2: 24 mmol/L (ref 22–32)
Calcium: 9.3 mg/dL (ref 8.9–10.3)
Chloride: 104 mmol/L (ref 98–111)
Creatinine, Ser: 0.76 mg/dL (ref 0.44–1.00)
GFR, Estimated: >60 mL/min (ref >60)
Glucose, Bld: 113 mg/dL — ABNORMAL HIGH (ref 70–99)
Potassium: 3.9 mmol/L (ref 3.5–5.1)
Sodium: 136 mmol/L (ref 135–145)

## 2021-03-20 MED ORDER — FENTANYL CITRATE (PF) 100 MCG/2ML IJ SOLN
50.0000 ug | Freq: Once | INTRAMUSCULAR | Status: AC
  Administered 2021-03-21: 50 ug via INTRAVENOUS
  Filled 2021-03-20: qty 2

## 2021-03-20 NOTE — ED Notes (Signed)
Ortho tech called and paged several time by this RN and Diplomatic Services operational officer, family made aware pending discharge post sling placement with verbalized understanding.

## 2021-03-20 NOTE — ED Notes (Signed)
Audio issues with hospital interpreter daughter-in-law to aid with translation at this time with pt consent.

## 2021-03-20 NOTE — ED Notes (Signed)
Daughter-in-law at bedside at this time, denies pt taking blood thinners.

## 2021-03-20 NOTE — ED Triage Notes (Signed)
EMS arrival from home following fall from porch, down 3 concrete steps. Hit L side, hit hit, blood thinners unknown. Co pain to L upper arm, previous break to upper L arm, possible closed fracture to L wrist, splint to L wrist and C-collar in place.  20 R hand 50 Fentanyl  A&Ox4, per family on scene pt baseline  172/100 100HR 16R 93% cbg 109

## 2021-03-20 NOTE — ED Provider Notes (Signed)
[facility] EMERGENCY DEPARTMENT Provider Note   CSN: [id number] Arrival date & time: 03/20/21  2059     History Chief Complaint  Patient presents with  . Fall    Yolanda [name] is a 87 y.o. female.  Yolanda [name] is a 87 y.o. female with a history of osteoarthritis and dementia, who presents to the emergency department via EMS after unwitnessed fall.  Patient's daughter-in-law is at bedside, difficulty using audio or video interpreter so she aided in interpretation to obtain history.  Patient reported that she tripped falling down 3 concrete steps outside of her house.  Daughter thinks she may have felt a bit dizzy.  Patient struck the left side of her face and head and has swelling and 2 abrasions noted.  Is not sure if she lost consciousness.  Denies jaw pain.  Was placed in c-collar by EMS.  Denies numbness tingling or weakness in extremities.  Complaining primarily of pain throughout her left arm, primarily in her left upper arm and left wrist.  History of previous wrist fracture with surgery after prior fall.  No chest pain, shortness of breath or abdominal pain.  No pain over her hips or lower extremities.  Aside from abrasions to the face, no other lacerations or wounds.  The history is provided by the patient, the EMS personnel and a relative. The history is limited by a language barrier.       Past Medical History:  Diagnosis Date  . Dementia [facility])     Patient Active Problem List   Diagnosis Date Noted  . OSTEOARTHRITIS 08/04/2010    No past surgical history on file.   OB History   No obstetric history on file.     No family history on file.  Social History   Tobacco Use  . Smoking status: Never Smoker  Substance Use Topics  . Alcohol use: No  . Drug use: No    Home Medications Prior to Admission medications   Medication Sig Start Date End Date Taking? Authorizing Provider  HYDROcodone-acetaminophen (NORCO) 5-325 MG tablet Take 1 tablet  by mouth every 6 (six) hours as needed. 03/21/21  Yes Dartha Lodge, PA-C  diclofenac sodium (VOLTAREN) 1 % GEL Apply 1 application topically 4 (four) times daily. 4gm per dose 11/23/11   Linna Hoff, MD  Guaifenesin-Codeine 400-10 MG TABS Take 1 tablet by mouth 4 (four) times daily as needed. 04/06/12   Moreno-Coll, Adlih, MD    Allergies    Patient has no known allergies.  Review of Systems   Review of Systems  Constitutional: Negative for chills and fever.  HENT: Positive for facial swelling.   Eyes: Negative for pain and visual disturbance.  Respiratory: Negative for shortness of breath.   Cardiovascular: Negative for chest pain.  Gastrointestinal: Negative for abdominal pain, nausea and vomiting.  Genitourinary: Negative for dysuria and frequency.  Musculoskeletal: Positive for arthralgias and neck pain. Negative for back pain and joint swelling.  Skin: Positive for wound.  Neurological: Positive for dizziness. Negative for seizures, syncope, weakness and numbness.  All other systems reviewed and are negative.   Physical Exam Updated Vital Signs BP (!) 167/84   Pulse (!) 101   Temp 97.6 F (36.4 C) (Oral)   Resp 17   SpO2 96%   Physical Exam Vitals and nursing note reviewed.  Constitutional:      General: She is not in acute distress.    Appearance: Normal appearance. She is well-developed. She is  not ill-appearing or diaphoretic.  HENT:     Head: Normocephalic.     Comments: Obvious trauma to the left side of the face and scalp with swelling above the left eye and 2 superficial abrasions, no step-off or deformity over the scalp.    Nose:     Comments:   No tenderness, deformity or epistaxis    Mouth/Throat:     Comments: No evidence of trauma to the mouth or jaw, no malocclusion or tenderness, no broken teeth or bleeding Eyes:     General:        Right eye: No discharge.        Left eye: No discharge.     Pupils: Pupils are equal, round, and reactive to light.   Neck:     Comments: C-collar in place, with some mild tenderness, no step off or deformity Cardiovascular:     Rate and Rhythm: Normal rate and regular rhythm.     Heart sounds: Normal heart sounds. No murmur heard. No friction rub. No gallop.   Pulmonary:     Effort: Pulmonary effort is normal. No respiratory distress.     Breath sounds: Normal breath sounds. No wheezing or rales.     Comments: Chest wall without tenderness or deformity, no ecchymosis, breath sounds present and equal bilaterally Abdominal:     General: Bowel sounds are normal. There is no distension.     Palpations: Abdomen is soft. There is no mass.     Tenderness: There is no abdominal tenderness. There is no guarding.     Comments: Abdomen soft, nondistended, nontender to palpation in all quadrants without guarding or peritoneal signs   Musculoskeletal:        General: Swelling and tenderness present.     Cervical back: Neck supple.     Comments: No midline thoracic or lumbar spine tenderness, no step off No tenderness or instability over the pelvis There is tenderness over the proximal left humerus without deformity. Pt able to move shoulder with some pain. Also has pain at the left elbow, wrist and hand with some swelling No tenderness or deformity over the right arm or bilateral lower extremities  Skin:    General: Skin is warm and dry.     Capillary Refill: Capillary refill takes less than 2 seconds.  Neurological:     Mental Status: She is alert and oriented to person, place, and time.     Coordination: Coordination normal.     Comments: Speech is clear, able to follow commands CN III-XII intact Normal strength in upper and lower extremities bilaterally including dorsiflexion and plantar flexion, strong and equal grip strength Sensation normal to light and sharp touch Moves extremities without ataxia, coordination intact  Psychiatric:        Mood and Affect: Mood normal.        Behavior: Behavior normal.      ED Results / Procedures / Treatments   Labs (all labs ordered are listed, but only abnormal results are displayed) Labs Reviewed  BASIC METABOLIC PANEL - Abnormal; Notable for the following components:      Result Value   Glucose, Bld 113 (*)    All other components within normal limits  CBC WITH DIFFERENTIAL/PLATELET    EKG EKG Interpretation  Date/Time:  Monday Mar 20 2021 21:12:26 EDT Ventricular Rate:  97 PR Interval:  164 QRS Duration: 92 QT Interval:  368 QTC Calculation: 468 R Axis:   72 Text Interpretation: Sinus rhythm No significant  change since last tracing Confirmed by Linwood Dibbles [phone]) on 03/20/2021 9:16:45 PM Also confirmed by Linwood Dibbles [phone]), editor Wilson, LaVerne ([id number])  on 03/21/2021 8:38:03 AM   Radiology DG Chest 2 View  Result Date: 03/20/2021 CLINICAL DATA:  87 year old female with fall. EXAM: CHEST - 2 VIEW COMPARISON:  Chest radiograph dated 08/07/2020. FINDINGS: Mild elevation of the right hemidiaphragm. There is diffuse interstitial coarsening. Right lung base atelectasis. No focal consolidation, pleural effusion or pneumothorax. There is stable cardiomegaly. Severe osteopenia with degenerative changes of the spine. Multilevel lower thoracic compression fractures and anterior wedging as seen on the prior radiograph. No definite acute fracture. IMPRESSION: 1. No acute cardiopulmonary process. 2. Stable cardiomegaly. Electronically Signed   By: Elgie Collard M.D.   On: 03/20/2021 22:30   DG Elbow Complete Left  Result Date: 03/20/2021 CLINICAL DATA:  Pain after fall EXAM: LEFT ELBOW - COMPLETE 3+ VIEW; LEFT SHOULDER - 2+ VIEW; LEFT WRIST - COMPLETE 3+ VIEW COMPARISON:  None. FINDINGS: Left shoulder: Frontal and transscapular views of the left shoulder demonstrate a mildly impacted left humeral neck fracture. The medial extent of the fracture line is obscured on the frontal view by overlying clothing artifact. No dislocation. Visualized portions  of the left chest are clear. Left elbow: Frontal, bilateral oblique, lateral views are obtained. Bones are diffusely osteopenic. No acute fracture, subluxation, or dislocation. Mild joint space narrowing. No joint effusion. Left wrist: Frontal, oblique, and lateral views are obtained. Postsurgical changes are seen from previous volar plate and screw fixation across a healed distal radial fracture. 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 Bones are diffusely osteopenic. No acute fracture, subluxation, or dislocation. There is diffuse osteoarthritis throughout the carpus, most pronounced at the radiocarpal joint. Soft tissues are unremarkable. IMPRESSION: 1. Mildly impacted left humeral neck fracture. 2. Diffuse osteopenia. 3. Osteoarthritis of the left elbow and wrist. 4. Chronic posttraumatic and postsurgical changes of the left wrist. No acute bony abnormality. Electronically Signed   By: Sharlet Salina M.D.   On: 03/20/2021 22:33   DG Wrist Complete Left  Result Date: 03/20/2021 CLINICAL DATA:  Pain after fall EXAM: LEFT ELBOW - COMPLETE 3+ VIEW; LEFT SHOULDER - 2+ VIEW; LEFT WRIST - COMPLETE 3+ VIEW COMPARISON:  None. FINDINGS: Left shoulder: Frontal and transscapular views of the left shoulder demonstrate a mildly impacted left humeral neck fracture. The medial extent of the fracture line is obscured on the frontal view by overlying clothing artifact. No dislocation. Visualized portions of the left chest are clear. Left elbow: Frontal, bilateral oblique, lateral views are obtained. Bones are diffusely osteopenic. No acute fracture, subluxation, or dislocation. Mild joint space narrowing. No joint effusion. Left wrist: Frontal, oblique, and lateral views are obtained. Postsurgical changes are seen from previous volar plate and screw fixation across a healed distal radial fracture. 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 Bones are diffusely osteopenic. No acute fracture, subluxation, or  dislocation. There is diffuse osteoarthritis throughout the carpus, most pronounced at the radiocarpal joint. Soft tissues are unremarkable. IMPRESSION: 1. Mildly impacted left humeral neck fracture. 2. Diffuse osteopenia. 3. Osteoarthritis of the left elbow and wrist. 4. Chronic posttraumatic and postsurgical changes of the left wrist. No acute bony abnormality. Electronically Signed   By: Sharlet Salina M.D.   On: 03/20/2021 22:33   CT Head Wo Contrast  Result Date: 03/20/2021 CLINICAL DATA:  Larey Seat downstairs, hit left side of head EXAM: CT HEAD WITHOUT CONTRAST TECHNIQUE: Contiguous axial images were obtained from the  base of the skull through the vertex without intravenous contrast. COMPARISON:  01/01/2012 FINDINGS: Brain: No acute infarct or hemorrhage. Lateral ventricles and midline structures are unremarkable. No acute extra-axial fluid collection. No mass effect. Vascular: There is a 1.3 x 1.1 x 1.1 cm hyperdense structure along the course of the left MCA, consistent with aneurysm. Correlation with CT angiography of the circle of Willis is recommended for further evaluation. Skull: There is significant left supraorbital soft tissue swelling and left frontal scalp hematoma. No underlying skull fracture. The remainder of the calvarium is unremarkable. Sinuses/Orbits: Minimal fluid within the left maxillary sinus. Remaining sinuses are clear. Other: None. IMPRESSION: 1. Left supraorbital soft tissue swelling and left frontal scalp hematoma. 2. No acute intracranial trauma. 3. Left MCA aneurysm, measuring up to 1.3 cm. CT angiography of the circle Willis is recommended for further evaluation. Critical Value/emergent results were called by telephone at the time of interpretation on 03/20/2021 at 10:22 pm to provider [facility] , who verbally acknowledged these results. Electronically Signed   By: Sharlet Salina M.D.   On: 03/20/2021 22:26   CT Cervical Spine Wo Contrast  Result Date: 03/20/2021 CLINICAL  DATA:  Larey Seat, hit left side of head EXAM: CT CERVICAL SPINE WITHOUT CONTRAST TECHNIQUE: Multidetector CT imaging of the cervical spine was performed without intravenous contrast. Multiplanar CT image reconstructions were also generated. COMPARISON:  None. FINDINGS: Alignment: Alignment is grossly anatomic. Skull base and vertebrae: No acute fracture. No primary bone lesion or focal pathologic process. Soft tissues and spinal canal: No prevertebral fluid or swelling. No visible canal hematoma. Disc levels:  No significant spondylosis or facet hypertrophy. Upper chest: Airway is patent.  Lung apices are clear. Other: Reconstructed images demonstrate no additional findings. IMPRESSION: 1. No acute cervical spine fracture. Electronically Signed   By: Sharlet Salina M.D.   On: 03/20/2021 22:25   DG Shoulder Left  Result Date: 03/20/2021 CLINICAL DATA:  Pain after fall EXAM: LEFT ELBOW - COMPLETE 3+ VIEW; LEFT SHOULDER - 2+ VIEW; LEFT WRIST - COMPLETE 3+ VIEW COMPARISON:  None. FINDINGS: Left shoulder: Frontal and transscapular views of the left shoulder demonstrate a mildly impacted left humeral neck fracture. The medial extent of the fracture line is obscured on the frontal view by overlying clothing artifact. No dislocation. Visualized portions of the left chest are clear. Left elbow: Frontal, bilateral oblique, lateral views are obtained. Bones are diffusely osteopenic. No acute fracture, subluxation, or dislocation. Mild joint space narrowing. No joint effusion. Left wrist: Frontal, oblique, and lateral views are obtained. Postsurgical changes are seen from previous volar plate and screw fixation across a healed distal radial fracture. 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 Bones are diffusely osteopenic. No acute fracture, subluxation, or dislocation. There is diffuse osteoarthritis throughout the carpus, most pronounced at the radiocarpal joint. Soft tissues are unremarkable. IMPRESSION: 1. Mildly  impacted left humeral neck fracture. 2. Diffuse osteopenia. 3. Osteoarthritis of the left elbow and wrist. 4. Chronic posttraumatic and postsurgical changes of the left wrist. No acute bony abnormality. Electronically Signed   By: Sharlet Salina M.D.   On: 03/20/2021 22:33   DG Hand Complete Left  Result Date: 03/20/2021 CLINICAL DATA:  87 year old female with fall. EXAM: LEFT HAND - COMPLETE 3+ VIEW COMPARISON:  Left wrist radiograph dated 03/20/2021. FINDINGS: There is a nondisplaced fracture of the base of the proximal phalanx of the fifth digit. No other acute fracture identified. Evaluation for fracture however is limited due to advanced osteopenia. There  is no dislocation. Distal radial fixation hardware. The hardware is intact. The soft tissue swelling of the hand. No soft tissue gas. IMPRESSION: Nondisplaced fracture of the base of the proximal phalanx of the fifth digit. Electronically Signed   By: Elgie CollardArash  Radparvar M.D.   On: 03/20/2021 23:17   CT Maxillofacial Wo Contrast  Result Date: 03/20/2021 CLINICAL DATA:  Larey SeatFell, hit left side of head EXAM: CT MAXILLOFACIAL WITHOUT CONTRAST TECHNIQUE: Multidetector CT imaging of the maxillofacial structures was performed. Multiplanar CT image reconstructions were also generated. COMPARISON:  None. FINDINGS: Osseous: No fracture or mandibular dislocation. No destructive process. Orbits: There is significant left periorbital soft tissue swelling and preseptal edema, consistent with acute trauma. The globes, extraocular muscles, and optic nerve are intact. The right orbit is unremarkable. Sinuses: Minimal fluid within the left maxillary sinus. Mild mucoperiosteal thickening within the anterior ethmoid air cells and right frontal sinus. Soft tissues: There is significant left periorbital soft tissue swelling, as well as a large left frontal scalp hematoma. Limited intracranial: Please refer to CT head report describing probable left MCA aneurysm. No acute  intracranial trauma. IMPRESSION: 1. No acute facial bone fracture. 2. Significant left periorbital and supraorbital soft tissue swelling. 3. Please refer to CT head report describing probable left MCA aneurysm. CT angiography of the circle of Willis recommended. Electronically Signed   By: Sharlet SalinaMichael  Brown M.D.   On: 03/20/2021 22:20    Procedures Procedures   Medications Ordered in ED Medications  fentaNYL (SUBLIMAZE) injection 50 mcg (50 mcg Intravenous Given 03/21/21 0025)    ED Course  I have reviewed the triage vital signs and the nursing notes.  Pertinent labs & imaging results that were available during my care of the patient were reviewed by me and considered in my medical decision making (see chart for details).    MDM Rules/Calculators/A&P                         87 year old female presents after fall down 3 steps on her porch at home.  Trauma to the left side of the face and head with hematoma and 2 superficial abrasions, no obvious deformity of the skull.  Patient alert, oriented with GCS 15.  Placed in c-collar by EMS.  Also complaining of pain throughout the left arm.  Some swelling noted at the left wrist and hand, patient previously had wrist fracture and surgery.  Also reporting pain at the proximal humerus and elbow.  Daughter-in-law is present at bedside, audio and video translator was not connecting so daughter-in-law assisted in translation.  Will evaluate with basic labs, EKG, CT of the head, C-spine and maxillofacial bones and x-rays of the chest, left shoulder, elbow, wrist and hand.  No tenderness or signs of trauma over the chest abdomen or pelvis.  Aside from the neck no midline spinal tenderness.  Patient not on blood thinners.  I have independently ordered, reviewed and interpreted all labs and imaging: CBC: No leukocytosis, normal hemoglobin BMP: No significant electrolyte derangements, normal renal function  EKG: Sinus rhythm with no concerning changes when  compared to previous tracing  CT head/C-spine/maxilofacial: Called by radiologist regarding CT results, no significant traumatic injury, no skull fracture or intracranial bleed, no C-spine fracture or traumatic malalignment, no fracture to the orbits or facial bones.  Radiologist does note a 1.3 cm left MCA aneurysm.  This is chronic and likely unrelated to patient's trauma today, and there are no signs of bleeding from  this aneurysm.  Radiology recommends discussing with patient and family and assessing functional status to determine if they would want further evaluation or intervention on this aneurysm.  X-rays of the left upper extremity show mildly impacted left humeral neck fracture without displacement, as well as a nondisplaced fracture at the base of the fifth proximal phalanx, postsurgical changes noted in the left wrist but no new fracture.  Patient will be placed in a ulnar gutter splint and sling and will follow up with orthopedics.  She may follow-up with me orthopedist at [facility] who did her prior wrist surgery, also given information to follow-up with Dr. Charlann Boxer.  I discussed at length with the patient and her daughter-in-law at bedside the aneurysm noted on her head CT today.  Discussed further evaluation with CTA and potential options for intervention with neurosurgery or interventional radiology.  Patient, daughter-in-law and additional family members discussed this at length over the phone, and the patient very specifically stated that she would not want to intervene on this and she does not want further evaluation.  Given her age and underlying dementia feel this is reasonable.  We will not proceed with CTA today but will provide neurosurgery follow-up if patient and family decide after further discussion they would like further intervention.  Also discussed signs and symptoms of aneurysm rupture that should warrant immediate return to the emergency department.  Discussed at length with  patient and family that if this aneurysm ruptures it can be life-threatening or lead to significant disability, and they understand this.  Will discharge with short course of pain medication which patient has tolerated well in the past with prior injury.  Discussed caution with this medicine and additional supportive care for fractures.  Discharged home in good condition.  Final Clinical Impression(s) / ED Diagnoses Final diagnoses:  Fall, initial encounter  Facial hematoma, initial encounter  Closed fracture of proximal end of left humerus, unspecified fracture morphology, initial encounter  Brain aneurysm    Rx / DC Orders ED Discharge Orders         Ordered    HYDROcodone-acetaminophen (NORCO) 5-325 MG tablet  Every 6 hours PRN        03/21/21 0010           Dartha Lodge, PA-C 03/21/21 1156    Linwood Dibbles, MD 03/21/21 1506

## 2021-03-21 DIAGNOSIS — S0083XA Contusion of other part of head, initial encounter: Secondary | ICD-10-CM | POA: Diagnosis not present

## 2021-03-21 MED ORDER — HYDROCODONE-ACETAMINOPHEN 5-325 MG PO TABS: 1.0000 | ORAL_TABLET | Freq: Four times a day (QID) | ORAL | 0 refills | Status: DC | PRN

## 2021-03-21 NOTE — Progress Notes (Signed)
Orthopedic Tech Progress Note Patient Details:  Yolanda [name] 1932/10/26 [id number]  Ortho Devices Type of Ortho Device: Arm sling,Ulna gutter splint Ortho Device/Splint Location: lue. Ortho Device/Splint Interventions: Ordered,Application,Adjustment   Post Interventions Patient Tolerated: Well Instructions Provided: Care of device,Adjustment of device   Trinna Post 03/21/2021, 1:29 AM

## 2021-03-21 NOTE — Discharge Instructions (Addendum)
You have fractures of your proximal humerus and of your fifth finger.  You will need to remain in splint and sling until you follow-up with orthopedics.  You can follow-up with your orthopedist who previously did your wrist surgery or you can follow-up with Dr. Charlann Boxer.  Please call tomorrow to schedule follow-up appointment.  For pain you can take Tylenol 650 mg every 6 hours, for breakthrough pain you can use prescribed hydrocodone 1 tablet every 6 hours as needed.  This medication can cause drowsiness please use with caution.  You can also apply ice.  Scans of the head, face and neck do not show any fractures or traumatic injury from fall.  Apply ice to swelling over the face and apply antibiotic ointment to abrasions on the face.  Your CT scan did show a 1.3 cm aneurysm in the brain.  After discussing this today you did not want to pursue further evaluation or intervention, but if you would like to follow-up regarding this you can follow-up with Dr. Johnsie Cancel with neurosurgery.  Return for severe headache, seizures, syncope, vomiting, numbness, weakness or any other new or concerning symptoms.  Brain aneurysms can rupture and this can be life-threatening.

## 2021-03-27 DIAGNOSIS — S42202A Unspecified fracture of upper end of left humerus, initial encounter for closed fracture: Secondary | ICD-10-CM

## 2021-03-27 HISTORY — DX: Unspecified fracture of upper end of left humerus, initial encounter for closed fracture: S42.202A

## 2022-04-04 ENCOUNTER — Emergency Department (HOSPITAL_BASED_OUTPATIENT_CLINIC_OR_DEPARTMENT_OTHER)
Admission: EM | Admit: 2022-04-04 | Discharge: 2022-04-04 | Disposition: A | Discharge status: Home / Self Care | Payer: Medicare Other | Attending: Emergency Medicine | Admitting: Emergency Medicine

## 2022-04-04 ENCOUNTER — Encounter (HOSPITAL_BASED_OUTPATIENT_CLINIC_OR_DEPARTMENT_OTHER): Payer: Self-pay | Admitting: Pediatrics

## 2022-04-04 ENCOUNTER — Emergency Department (HOSPITAL_BASED_OUTPATIENT_CLINIC_OR_DEPARTMENT_OTHER): Payer: Medicare Other

## 2022-04-04 ENCOUNTER — Other Ambulatory Visit: Payer: Self-pay

## 2022-04-04 DIAGNOSIS — F0390 Unspecified dementia without behavioral disturbance: Secondary | ICD-10-CM | POA: Insufficient documentation

## 2022-04-04 DIAGNOSIS — K5900 Constipation, unspecified: Secondary | ICD-10-CM | POA: Insufficient documentation

## 2022-04-04 DIAGNOSIS — R1013 Epigastric pain: Secondary | ICD-10-CM

## 2022-04-04 DIAGNOSIS — R079 Chest pain, unspecified: Secondary | ICD-10-CM | POA: Insufficient documentation

## 2022-04-04 LAB — CBC
HCT: 39.5 % (ref 36.0–46.0)
Hemoglobin: 13.2 g/dL (ref 12.0–15.0)
MCH: 29 pg (ref 26.0–34.0)
MCHC: 33.4 g/dL (ref 30.0–36.0)
MCV: 86.8 fL (ref 80.0–100.0)
Platelets: 195 10*3/uL (ref 150–400)
RBC: 4.55 MIL/uL (ref 3.87–5.11)
RDW: 13.5 % (ref 11.5–15.5)
WBC: 5.6 10*3/uL (ref 4.0–10.5)
nRBC: 0 % (ref 0.0–0.2)

## 2022-04-04 LAB — BASIC METABOLIC PANEL
Anion gap: 5 (ref 5–15)
BUN: 10 mg/dL (ref 8–23)
CO2: 24 mmol/L (ref 22–32)
Calcium: 9.1 mg/dL (ref 8.9–10.3)
Chloride: 108 mmol/L (ref 98–111)
Creatinine, Ser: 0.7 mg/dL (ref 0.44–1.00)
GFR, Estimated: >60 mL/min (ref >60)
Glucose, Bld: 95 mg/dL (ref 70–99)
Potassium: 4 mmol/L (ref 3.5–5.1)
Sodium: 137 mmol/L (ref 135–145)

## 2022-04-04 LAB — URINALYSIS, ROUTINE W REFLEX MICROSCOPIC
Bilirubin Urine: NEGATIVE
Glucose, UA: NEGATIVE mg/dL
Hgb urine dipstick: NEGATIVE
Ketones, ur: NEGATIVE mg/dL
Leukocytes,Ua: NEGATIVE
Nitrite: NEGATIVE
Protein, ur: NEGATIVE mg/dL
Specific Gravity, Urine: 1.01 (ref 1.005–1.030)
pH: 6.5 (ref 5.0–8.0)

## 2022-04-04 LAB — D-DIMER, QUANTITATIVE: D-Dimer, Quant: 0.63 ug/mL-FEU — ABNORMAL HIGH (ref 0.00–0.50)

## 2022-04-04 LAB — LIPASE, BLOOD: Lipase: 24 U/L (ref 11–51)

## 2022-04-04 LAB — HEPATIC FUNCTION PANEL
ALT: 23 U/L (ref 0–44)
AST: 27 U/L (ref 15–41)
Albumin: 3.8 g/dL (ref 3.5–5.0)
Alkaline Phosphatase: 69 U/L (ref 38–126)
Bilirubin, Direct: 0.1 mg/dL (ref 0.0–0.2)
Indirect Bilirubin: 1.1 mg/dL — ABNORMAL HIGH (ref 0.3–0.9)
Total Bilirubin: 1.2 mg/dL (ref 0.3–1.2)
Total Protein: 7.6 g/dL (ref 6.5–8.1)

## 2022-04-04 LAB — BRAIN NATRIURETIC PEPTIDE: B Natriuretic Peptide: 16.3 pg/mL (ref 0.0–100.0)

## 2022-04-04 LAB — TROPONIN I (HIGH SENSITIVITY)
Troponin I (High Sensitivity): 3 ng/L (ref <18)
Troponin I (High Sensitivity): 3 ng/L (ref <18)

## 2022-04-04 MED ORDER — ALUM & MAG HYDROXIDE-SIMETH 200-200-20 MG/5ML PO SUSP
30.0000 mL | Freq: Once | ORAL | Status: AC
  Administered 2022-04-04: 30 mL via ORAL
  Filled 2022-04-04: qty 30

## 2022-04-04 MED ORDER — SUCRALFATE 1 G PO TABS: 1.0000 g | ORAL_TABLET | Freq: Three times a day (TID) | ORAL | 0 refills | Status: DC

## 2022-04-04 MED ORDER — PANTOPRAZOLE SODIUM 20 MG PO TBEC: 20.0000 mg | DELAYED_RELEASE_TABLET | Freq: Every day | ORAL | 0 refills | Status: DC

## 2022-04-04 MED ORDER — POLYETHYLENE GLYCOL 3350 17 GM/SCOOP PO POWD: 17.0000 g | Freq: Every day | ORAL | 0 refills | Status: AC

## 2022-04-04 MED ORDER — IOHEXOL 300 MG/ML  SOLN
100.0000 mL | Freq: Once | INTRAMUSCULAR | Status: AC | PRN
  Administered 2022-04-04: 100 mL via INTRAVENOUS

## 2022-04-04 MED ORDER — SODIUM CHLORIDE 0.9 % IV BOLUS
1000.0000 mL | Freq: Once | INTRAVENOUS | Status: AC
  Administered 2022-04-04: 1000 mL via INTRAVENOUS

## 2022-04-04 MED ORDER — FAMOTIDINE IN NACL 20-0.9 MG/50ML-% IV SOLN
20.0000 mg | Freq: Once | INTRAVENOUS | Status: AC
  Administered 2022-04-04: 20 mg via INTRAVENOUS
  Filled 2022-04-04: qty 50

## 2022-04-04 MED ORDER — LIDOCAINE VISCOUS HCL 2 % MT SOLN
15.0000 mL | Freq: Once | OROMUCOSAL | Status: AC
  Administered 2022-04-04: 15 mL via ORAL
  Filled 2022-04-04: qty 15

## 2022-04-04 NOTE — ED Notes (Signed)
Patient transported to CT 

## 2022-04-04 NOTE — ED Triage Notes (Signed)
C/O back & chest pain for the past 2 days; worst today.

## 2022-04-04 NOTE — ED Notes (Signed)
Patient transported to X-ray 

## 2022-04-04 NOTE — ED Provider Notes (Signed)
[place] EMERGENCY DEPARTMENT Provider Note   CSN: [id number] Arrival date & time: 04/04/22  0931     History  Chief Complaint  Patient presents with   Chest Pain    Yolanda [name] is a 88 y.o. female.  Patient as above with significant medical history as below, including dementia, cholecystectomy who presents to the ED with complaint of abdominal complaint.  Patient reports has been having epigastric abdominal discomfort radiation to her back over the past 3 days.  She was seen by PCP at [facility]  Symptoms ongoing over the past 3 days, mildly worsening.  No medications prior to arrival.  Denies alcohol use, no tobacco use, no excessive use of NSAIDs.  No change in bowel or bladder function, no nausea or vomiting.  No BRBPR or melena.  No fevers or chills.  No dyspnea; symptoms worsened with twisting torso or certain positions.  Patient is also concerned that her right ankle has been hurting over the past 2 years.  No recent ankle injuries.  Patient is also concerned that she might be constipated.    Past Medical History:  Diagnosis Date   Dementia ([place])     History reviewed. No pertinent surgical history.   The history is provided by the patient and a relative. The history is limited by a language barrier. A language interpreter was used Art therapist, [place]).  Chest Pain Associated symptoms: abdominal pain   Associated symptoms: no cough, no dysphagia, no fever, no headache, no nausea, no palpitations, no shortness of breath and no vomiting        Home Medications Prior to Admission medications   Medication Sig Start Date End Date Taking? Authorizing Provider  pantoprazole (PROTONIX) 20 MG tablet Take 1 tablet (20 mg total) by mouth daily for 14 days. 04/04/22 04/18/22 Yes Wynona Dove A, DO  polyethylene glycol powder (GLYCOLAX/MIRALAX) 17 GM/SCOOP powder Take 17 g by mouth daily for 7 doses. Stop if you develop diarrhea 04/04/22 04/11/22 Yes  Wynona Dove A, DO  sucralfate (CARAFATE) 1 g tablet Take 1 tablet (1 g total) by mouth 4 (four) times daily -  with meals and at bedtime for 7 days. 04/04/22 04/11/22 Yes Wynona Dove A, DO  diclofenac sodium (VOLTAREN) 1 % GEL Apply 1 application topically 4 (four) times daily. 4gm per dose 11/23/11   Billy Fischer, MD  Guaifenesin-Codeine 400-10 MG TABS Take 1 tablet by mouth 4 (four) times daily as needed. 04/06/12   Moreno-Coll, Adlih, MD  HYDROcodone-acetaminophen (NORCO) 5-325 MG tablet Take 1 tablet by mouth every 6 (six) hours as needed. 03/21/21   Jacqlyn Larsen, PA-C      Allergies    Patient has no known allergies.    Review of Systems   Review of Systems  Constitutional:  Negative for chills and fever.  HENT:  Negative for facial swelling and trouble swallowing.   Eyes:  Negative for photophobia and visual disturbance.  Respiratory:  Negative for cough and shortness of breath.   Cardiovascular:  Positive for chest pain. Negative for palpitations.  Gastrointestinal:  Positive for abdominal pain and constipation. Negative for nausea and vomiting.  Endocrine: Negative for polydipsia and polyuria.  Genitourinary:  Negative for difficulty urinating and hematuria.  Musculoskeletal:  Positive for arthralgias. Negative for gait problem and joint swelling.  Skin:  Negative for pallor and rash.  Neurological:  Negative for syncope and headaches.  Psychiatric/Behavioral:  Negative for agitation and confusion.     Physical  Exam Updated Vital Signs BP (!) 153/106   Pulse 68   Temp 97.8 F (36.6 C) (Oral)   Resp 20   SpO2 98%  Physical Exam Vitals and nursing note reviewed.  Constitutional:      General: She is not in acute distress.    Appearance: Normal appearance. She is well-developed. She is not ill-appearing, toxic-appearing or diaphoretic.  HENT:     Head: Normocephalic and atraumatic. No raccoon eyes, Battle's sign, right periorbital erythema or left periorbital erythema.      Jaw: There is normal jaw occlusion.     Comments: No external evidence of head trauma    Right Ear: External ear normal.     Left Ear: External ear normal.     Nose: Nose normal.     Mouth/Throat:     Mouth: Mucous membranes are moist.  Eyes:     General: No scleral icterus.       Right eye: No discharge.        Left eye: No discharge.  Cardiovascular:     Rate and Rhythm: Normal rate and regular rhythm.     Pulses: Normal pulses.     Heart sounds: Normal heart sounds.  Pulmonary:     Effort: Pulmonary effort is normal. No tachypnea, accessory muscle usage or respiratory distress.     Breath sounds: Normal breath sounds.  Abdominal:     General: Abdomen is flat.     Palpations: Abdomen is soft.     Tenderness: There is abdominal tenderness in the right upper quadrant. There is no guarding or rebound.       Comments: Not peritoneal No external evidence of trauma at site of discomfort  Musculoskeletal:        General: Normal range of motion.     Cervical back: Full passive range of motion without pain and normal range of motion.     Right lower leg: No edema.     Left lower leg: No edema.  Skin:    General: Skin is warm and dry.     Capillary Refill: Capillary refill takes less than 2 seconds.  Neurological:     Mental Status: She is alert and oriented to person, place, and time. Mental status is at baseline.     GCS: GCS eye subscore is 4. GCS verbal subscore is 5. GCS motor subscore is 6.     Comments: At baseline per family at bedside  Psychiatric:        Mood and Affect: Mood normal.        Behavior: Behavior normal.     ED Results / Procedures / Treatments   Labs (all labs ordered are listed, but only abnormal results are displayed) Labs Reviewed  HEPATIC FUNCTION PANEL - Abnormal; Notable for the following components:      Result Value   Indirect Bilirubin 1.1 (*)    All other components within normal limits  D-DIMER, QUANTITATIVE (NOT AT [facility]) - Abnormal;  Notable for the following components:   D-Dimer, Quant 0.63 (*)    All other components within normal limits  BASIC METABOLIC PANEL  CBC  LIPASE, BLOOD  BRAIN NATRIURETIC PEPTIDE  URINALYSIS, ROUTINE W REFLEX MICROSCOPIC  TROPONIN I (HIGH SENSITIVITY)  TROPONIN I (HIGH SENSITIVITY)    EKG EKG Interpretation  Date/Time:  Wednesday April 04 2022 09:43:04 EDT Ventricular Rate:  81 PR Interval:  173 QRS Duration: 91 QT Interval:  407 QTC Calculation: 473 R Axis:   63 Text  Interpretation: Sinus rhythm similar to prior no stemi Interpretation limited secondary to artifact Confirmed by Wynona Dove ([id number]) on 04/04/2022 9:57:18 AM  Radiology No results found.  Procedures Procedures    Medications Ordered in ED Medications  famotidine (PEPCID) IVPB 20 mg premix (0 mg Intravenous Stopped 04/04/22 1227)  sodium chloride 0.9 % bolus 1,000 mL (0 mLs Intravenous Stopped 04/04/22 1227)  alum & mag hydroxide-simeth (MAALOX/MYLANTA) 200-200-20 MG/5ML suspension 30 mL (30 mLs Oral Given 04/04/22 1052)    And  lidocaine (XYLOCAINE) 2 % viscous mouth solution 15 mL (15 mLs Oral Given 04/04/22 1052)  iohexol (OMNIPAQUE) 300 MG/ML solution 100 mL (100 mLs Intravenous Contrast Given 04/04/22 1108)    ED Course/ Medical Decision Making/ A&P                           Medical Decision Making Amount and/or Complexity of Data Reviewed Labs: ordered. Radiology: ordered.  Risk OTC drugs. Prescription drug management.    CC: Epigastric pain, right upper quadrant pain, chest pain  This patient presents to the Emergency Department for the above complaint. This involves an extensive number of treatment options and is a complaint that carries with it a high risk of complications and morbidity. Vital signs were reviewed. Serious etiologies considered.  Differential diagnosis includes but is not exclusive to acute cholecystitis, intrathoracic causes for epigastric abdominal pain, gastritis, duodenitis,  pancreatitis, small bowel or large bowel obstruction, abdominal aortic aneurysm, hernia, gastritis, acs, pe etc.   Record review:  Previous records obtained and reviewed prior visits, prior labs and imaging, home medications  Additional history obtained from family at bedside  Medical and surgical history as noted above.   Work up as above, notable for:  Labs & imaging results that were available during my care of the patient were visualized by me and considered in my medical decision making.   Low risk Wells score, will get D-dimer- this returned at 0.63, age adjusted d-dimer is not positive.   I ordered imaging studies which included chest x-ray, ankle x-ray. CT A/p I visualized the imaging, interpreted images, and I agree with radiologist interpretation.  CT with concern for constipation, aortic atherosclerosis,?  Lobulated hepatic contour with fissural widening, liver enzymes are stable.  sHe is not jaundiced.  Cardiac monitoring reviewed and interpreted personally which shows nsr  Labs reviewed otherwise are stable  Management: GI cocktail, bolus of IV fluids  Reassessment:  Patient is feeling much better after intervention.  She is able to tolerate p.o. intake.  No nausea or vomiting.  No fevers or chills.  She is speaking comfortably on ambient air.  HDS  Admission was considered.   Suspicion for gastritis as etiology of her discomfort.   The patient's chest/epigastric pain is not suggestive of pulmonary embolus, cardiac ischemia, aortic dissection, pericarditis, myocarditis, pulmonary embolism, pneumothorax, pneumonia, Zoster, or esophageal perforation, or other serious etiology.  Historically not abrupt in onset, tearing or ripping, pulses symmetric. EKG nonspecific for ischemia/infarction. No dysrhythmias, brugada, WPW, prolonged QT noted. Trop negative x2, CXR non-acute.   Given the extremely low risk of these diagnoses further testing and evaluation for these possibilities  does not appear to be indicated at this time. Patient in no distress and overall condition improved here in the ED. Detailed discussions were had with the patient regarding current findings, and need for close f/u with PCP or on call doctor. The patient has been instructed to return immediately if the  symptoms worsen in any way for re-evaluation. Patient verbalized understanding and is in agreement with current care plan. All questions answered prior to discharge.           Social determinants of health include -  Social History   Socioeconomic History   Marital status: Married    Spouse name: Not on file   Number of children: Not on file   Years of education: Not on file   Highest education level: Not on file  Occupational History   Not on file  Tobacco Use   Smoking status: Never   Smokeless tobacco: Not on file  Substance and Sexual Activity   Alcohol use: No   Drug use: No   Sexual activity: Not on file  Other Topics Concern   Not on file  Social History Narrative   Not on file   Social Determinants of Health   Financial Resource Strain: Not on file  Food Insecurity: Not on file  Transportation Needs: Not on file  Physical Activity: Not on file  Stress: Not on file  Social Connections: Not on file  Intimate Partner Violence: Not on file      This chart was dictated using voice recognition software.  Despite best efforts to proofread,  errors can occur which can change the documentation meaning.         Final Clinical Impression(s) / ED Diagnoses Final diagnoses:  Epigastric pain  Constipation, unspecified constipation type    Rx / DC Orders ED Discharge Orders          Ordered    sucralfate (CARAFATE) 1 g tablet  3 times daily with meals & bedtime        04/04/22 1439    pantoprazole (PROTONIX) 20 MG tablet  Daily        04/04/22 1439    polyethylene glycol powder (GLYCOLAX/MIRALAX) 17 GM/SCOOP powder  Daily        04/04/22 1440               Jeanell Sparrow, DO 04/06/22 1605

## 2022-04-04 NOTE — ED Notes (Signed)
Patient is triaged using [employer] interpreter: Nepali. Family, daughter-in-law and grandson at bedside. Denies any past medical history and daily medications when asked.

## 2022-04-04 NOTE — Discharge Instructions (Addendum)
??? ?????? ???? ??????? ???? ??????? ????? ?????? ?????? ? ?????, ?? ???? ????? ??? ???? ?????, ???????? ?????, ?? ??????? ????? ??????? ??????? ?????????? ?? ??? ????? (?? ??????? ???????? ?????) ????? ????? ???? ??? ????? ?????????? ??? ???????? ??????? ????????? ??????????:   1. ????? ??? ???? ????? ?? ???? ?? ?????? ????? 2. ?????? ?? ???????? ????? ????? ??? ????? 3. ?????? ?? ????? ?? ????? ?????? 4. ????? ??? 24 ??????? ???? ??????? 5. ??? ????????? ????? ?????? (??????) - ????? ??? ??? ????? ?? ????? ????? ????????? ???? 6. ????? ????? 7. ?????? ?? ?????? ???? 8. ???? ????? ?? ???? ????? 9. ??? ????????? 10. ???? ??? ???? ?? ???????? ???? ???????     ?????? ??????????? ????? ?-? ????? ????? ????? ?????? ????? ????? ???????? ?????? ?????????? ??????????? ED ?? ???????? ??? ????? ?????????? ?? ??? ??????? ????? ?????? ??????????   ???????? ??????????? ????? ???? ???? ???????, ????????, ?? ???????   ????? ???? ??? ???? ?? ????????? ?????????? ????, ?? ??? ????? ???? ??????? ??? ???????? ???????? ??????? ???????????    ?kasmika vibh?gam? ?ja tap?'??k? h?rac?ha garna p?'um?d? khus? l?gy?Philomena Course pani bigran? v? cint?janaka lak?a?ahar?k? l?gi kr?pay? ?p?tak?l?na vibh?gam? pharkanuh?s.

## 2022-04-04 NOTE — ED Notes (Signed)
Pt ambulated to bathroom with stand by assist.  

## 2022-04-29 ENCOUNTER — Other Ambulatory Visit: Payer: Self-pay

## 2022-04-29 ENCOUNTER — Emergency Department (HOSPITAL_BASED_OUTPATIENT_CLINIC_OR_DEPARTMENT_OTHER)
Admission: EM | Admit: 2022-04-29 | Discharge: 2022-04-29 | Disposition: A | Discharge status: Home / Self Care | Payer: Medicare Other | Attending: Emergency Medicine | Admitting: Emergency Medicine

## 2022-04-29 ENCOUNTER — Encounter (HOSPITAL_BASED_OUTPATIENT_CLINIC_OR_DEPARTMENT_OTHER): Payer: Self-pay | Admitting: Emergency Medicine

## 2022-04-29 ENCOUNTER — Emergency Department (HOSPITAL_BASED_OUTPATIENT_CLINIC_OR_DEPARTMENT_OTHER): Payer: Medicare Other

## 2022-04-29 DIAGNOSIS — R079 Chest pain, unspecified: Secondary | ICD-10-CM | POA: Insufficient documentation

## 2022-04-29 DIAGNOSIS — R0602 Shortness of breath: Secondary | ICD-10-CM | POA: Insufficient documentation

## 2022-04-29 LAB — CBC
HCT: 42.7 % (ref 36.0–46.0)
Hemoglobin: 14.2 g/dL (ref 12.0–15.0)
MCH: 29 pg (ref 26.0–34.0)
MCHC: 33.3 g/dL (ref 30.0–36.0)
MCV: 87.1 fL (ref 80.0–100.0)
Platelets: 260 10*3/uL (ref 150–400)
RBC: 4.9 MIL/uL (ref 3.87–5.11)
RDW: 13.6 % (ref 11.5–15.5)
WBC: 7.1 10*3/uL (ref 4.0–10.5)
nRBC: 0 % (ref 0.0–0.2)

## 2022-04-29 LAB — BASIC METABOLIC PANEL
Anion gap: 6 (ref 5–15)
BUN: 11 mg/dL (ref 8–23)
CO2: 25 mmol/L (ref 22–32)
Calcium: 9.8 mg/dL (ref 8.9–10.3)
Chloride: 107 mmol/L (ref 98–111)
Creatinine, Ser: 0.85 mg/dL (ref 0.44–1.00)
GFR, Estimated: >60 mL/min (ref >60)
Glucose, Bld: 101 mg/dL — ABNORMAL HIGH (ref 70–99)
Potassium: 4.1 mmol/L (ref 3.5–5.1)
Sodium: 138 mmol/L (ref 135–145)

## 2022-04-29 LAB — D-DIMER, QUANTITATIVE: D-Dimer, Quant: 0.58 ug/mL-FEU — ABNORMAL HIGH (ref 0.00–0.50)

## 2022-04-29 LAB — TROPONIN I (HIGH SENSITIVITY)
Troponin I (High Sensitivity): 4 ng/L (ref <18)
Troponin I (High Sensitivity): 4 ng/L (ref <18)

## 2022-04-29 NOTE — ED Provider Notes (Signed)
[facility] EMERGENCY DEPARTMENT Provider Note   CSN: [id number] Arrival date & time: 04/29/22  1524     History  Chief Complaint  Patient presents with   Chest Pain    Lynnlee Grothe is a 88 y.o. female.  Presented to department due to concern for chest pain.  Patient reports that she has been having chest pain for about 3 weeks.  In triage had reported that it was radiating however patient denies any radiation to me.  Says that at times it is associated with shortness of breath.  The shortness of breath is worse with exertion.  The pain occurs at times at rest and with exertion.  Currently not having any pain.  Patient worse with certain positions.  She has not noted any other alleviating or aggravating factors.  Has not tried taking any medications for this.  States that she has been to her primary care doctor a few times for the symptoms.  Utilizing Translator throughout visit.  Additional history obtained from granddaughter at bedside who speaks [place].  HPI     Home Medications Prior to Admission medications   Medication Sig Start Date End Date Taking? Authorizing Provider  diclofenac sodium (VOLTAREN) 1 % GEL Apply 1 application topically 4 (four) times daily. 4gm per dose 11/23/11   Linna Hoff, MD  Guaifenesin-Codeine 400-10 MG TABS Take 1 tablet by mouth 4 (four) times daily as needed. 04/06/12   Moreno-Coll, Adlih, MD  HYDROcodone-acetaminophen (NORCO) 5-325 MG tablet Take 1 tablet by mouth every 6 (six) hours as needed. 03/21/21   Dartha Lodge, PA-C  pantoprazole (PROTONIX) 20 MG tablet Take 1 tablet (20 mg total) by mouth daily for 14 days. 04/04/22 04/18/22  Sloan Leiter, DO  sucralfate (CARAFATE) 1 g tablet Take 1 tablet (1 g total) by mouth 4 (four) times daily -  with meals and at bedtime for 7 days. 04/04/22 04/11/22  Sloan Leiter, DO      Allergies    Patient has no known allergies.    Review of Systems   Review of Systems  Constitutional:  Negative  for chills and fever.  HENT:  Negative for ear pain and sore throat.   Eyes:  Negative for pain and visual disturbance.  Respiratory:  Positive for shortness of breath. Negative for cough.   Cardiovascular:  Positive for chest pain. Negative for palpitations.  Gastrointestinal:  Negative for abdominal pain and vomiting.  Genitourinary:  Negative for dysuria and hematuria.  Musculoskeletal:  Negative for arthralgias and back pain.  Skin:  Negative for color change and rash.  Neurological:  Negative for seizures and syncope.  All other systems reviewed and are negative.   Physical Exam Updated Vital Signs BP 136/69   Pulse 78   Temp 98.8 F (37.1 C) (Oral)   Resp 16   Ht 4\' 8"  (1.422 m)   Wt 57.2 kg   SpO2 96%   BMI 28.25 kg/m  Physical Exam Vitals and nursing note reviewed.  Constitutional:      General: She is not in acute distress.    Appearance: She is well-developed.  HENT:     Head: Normocephalic and atraumatic.  Eyes:     Conjunctiva/sclera: Conjunctivae normal.  Cardiovascular:     Rate and Rhythm: Normal rate and regular rhythm.     Heart sounds: No murmur heard. Pulmonary:     Effort: Pulmonary effort is normal. No respiratory distress.     Breath sounds: Normal breath  sounds.  Abdominal:     Palpations: Abdomen is soft.     Tenderness: There is no abdominal tenderness.  Musculoskeletal:        General: No swelling.     Cervical back: Neck supple.  Skin:    General: Skin is warm and dry.     Capillary Refill: Capillary refill takes less than 2 seconds.  Neurological:     General: No focal deficit present.     Mental Status: She is alert.  Psychiatric:        Mood and Affect: Mood normal.     ED Results / Procedures / Treatments   Labs (all labs ordered are listed, but only abnormal results are displayed) Labs Reviewed  BASIC METABOLIC PANEL - Abnormal; Notable for the following components:      Result Value   Glucose, Bld 101 (*)    All other  components within normal limits  D-DIMER, QUANTITATIVE - Abnormal; Notable for the following components:   D-Dimer, Quant 0.58 (*)    All other components within normal limits  CBC  TROPONIN I (HIGH SENSITIVITY)  TROPONIN I (HIGH SENSITIVITY)    EKG EKG Interpretation  Date/Time:  Sunday April 29 2022 15:42:27 EDT Ventricular Rate:  96 PR Interval:  156 QRS Duration: 84 QT Interval:  370 QTC Calculation: 467 R Axis:   68 Text Interpretation: Normal sinus rhythm Normal ECG When compared with ECG of 04-Apr-2022 09:43, no acute changes Confirmed by ,  ([id number]) on 04/29/2022 4:18:14 PM  Radiology DG Chest 2 View  Result Date: 04/29/2022 CLINICAL DATA:  Chest pain EXAM: CHEST - 2 VIEW COMPARISON:  08/07/2020 FINDINGS: Cardiomegaly. Unchanged eventration the anterior right hemidiaphragm both lungs are clear. Multiple wedge deformities of the mid to lower thoracic spine, unchanged, with exaggerated kyphosis. IMPRESSION: Cardiomegaly without acute abnormality of the lungs. Electronically Signed   By: Alex D Bibbey M.D.   On: 04/29/2022 16:23    Procedures Procedures    Medications Ordered in ED Medications - No data to display  ED Course/ Medical Decision Making/ A&P                           Medical Decision Making Amount and/or Complexity of Data Reviewed Labs: ordered. Radiology: ordered.   89 year old lady presented to ER due to concern for chest pain and shortness of breath.  On physical exam she is well-appearing in no distress and has not had any ongoing chest pain or difficulty in breathing.  Her vital signs are within normal limits.  EKG is without acute ischemic change.  Initial troponin normal, D-dimer within normal limits, doubt PE.  Will check repeat troponin.  CXR per my review does not show any acute infiltrate or edema.  Radiologist did comment on some degree of cardiomegaly.  Patient denies any prior history of cardiac disease.  Given age, symptoms today,  feel she would benefit from cardiology follow-up.  Given reassuring work-up, no ongoing pain, do not feel that she needs admission and feel she can follow-up in the outpatient setting.  Will refer to cardiology, additionally advised follow-up with primary care.        Final Clinical Impression(s) / ED Diagnoses Final diagnoses:  Chest pain, unspecified type    Rx / DC Orders ED Discharge Orders          Ordered    Ambulatory referral to Cardiology        07 /09/23 1913  Lucrezia Starch, MD 04/29/22 [id number]

## 2022-04-29 NOTE — ED Notes (Signed)
Patient transported to X-ray 

## 2022-04-29 NOTE — ED Triage Notes (Signed)
Pt speaks Nepali, translator used # [phone] Pt arrives pov, slow gait, c/o left side CP,  radiating to back x 2-3 weeks.  Increased shob with ambulation. Also reports left breast pain.

## 2022-04-29 NOTE — Discharge Instructions (Signed)
Follow-up both with your primary care doctor and with cardiology.  Come back to ER for having worsening chest pain, difficulty breathing or other concerning symptom.  For pain control can try over-the-counter medications such as Tylenol or as needed.

## 2022-05-02 ENCOUNTER — Encounter: Payer: Self-pay | Admitting: *Deleted

## 2022-05-02 ENCOUNTER — Encounter: Payer: Self-pay | Admitting: Cardiology

## 2022-06-29 DIAGNOSIS — F0390 Unspecified dementia without behavioral disturbance: Secondary | ICD-10-CM | POA: Insufficient documentation

## 2022-07-02 NOTE — Progress Notes (Deleted)
Cardiology Office Note:    Date:  07/02/2022   ID:  Yolanda [name], DOB [birth year], MRN [id number]  PCP:  Pcp, No  Cardiologist:  Norman Herrlich, MD   Referring MD: Milagros Loll, MD  ASSESSMENT:    No diagnosis found. PLAN:    In order of problems listed above:  ***  Next appointment   Medication Adjustments/Labs and Tests Ordered: Current medicines are reviewed at length with the patient today.  Concerns regarding medicines are outlined above.  No orders of the defined types were placed in this encounter.  No orders of the defined types were placed in this encounter.    No chief complaint on file. ***  History of Present Illness:    Yolanda [name] is a 88 y.o. female who is being seen today for the evaluation of chest pain at the request of Milagros Loll, MD.  Chest pain was described as nonexertional and positional in nature. She was seen in the emergency room [facility] for chest pain 04/29/2022 her D-dimer was normal for age high-sensitivity troponin initial and repeat both normal with no significant delta and EKG showed sinus rhythm and was normal.  She exhibited no findings of acute coronary syndrome was felt to be appropriate for discharge and outpatient cardiac evaluation.  Chest x-ray 04/04/2022 and 0 04/29/2022 which was normal.  Had no previous cardiology evaluation or diagnostic imaging performed Past Medical History:  Diagnosis Date   Closed fracture of left proximal humerus 03/27/2021   Closed fracture of lower end of left radius with routine healing 10/31/2020   Dementia ([facility])    Mild intermittent asthma without complication 04/04/2019   OSTEOARTHRITIS 08/04/2010   Annotation: Bilateral knees and ankles Qualifier: Diagnosis of  By: Delrae Alfred MD, Elizabeth      No past surgical history on file.  Current Medications: No outpatient medications have been marked as taking for the 07/03/22 encounter (Appointment) with Baldo Daub, MD.      Allergies:   Patient has no known allergies.   Social History   Socioeconomic History   Marital status: Married    Spouse name: Not on file   Number of children: Not on file   Years of education: Not on file   Highest education level: Not on file  Occupational History   Not on file  Tobacco Use   Smoking status: Never   Smokeless tobacco: Not on file  Substance and Sexual Activity   Alcohol use: No   Drug use: No   Sexual activity: Not on file  Other Topics Concern   Not on file  Social History Narrative   Not on file   Social Determinants of Health   Financial Resource Strain: Not on file  Food Insecurity: Not on file  Transportation Needs: Not on file  Physical Activity: Not on file  Stress: Not on file  Social Connections: Not on file     Family History: The patient's ***family history is not on file.  ROS:   ROS Please see the history of present illness.    *** All other systems reviewed and are negative.  EKGs/Labs/Other Studies Reviewed:    The following studies were reviewed today: ***  EKG:  EKG is *** ordered today.  The ekg ordered today is personally reviewed and demonstrates ***  Recent Labs: 04/04/2022: ALT 23; B Natriuretic Peptide 16.3 04/29/2022: BUN 11; Creatinine, Ser 0.85; Hemoglobin 14.2; Platelets 260; Potassium 4.1; Sodium 138  Recent Lipid Panel No results  found for: "CHOL", "TRIG", "HDL", "CHOLHDL", "VLDL", "LDLCALC", "LDLDIRECT"  Physical Exam:    VS:  There were no vitals taken for this visit.    Wt Readings from Last 3 Encounters:  04/29/22 126 lb (57.2 kg)     GEN: *** Well nourished, well developed in no acute distress HEENT: Normal NECK: No JVD; No carotid bruits LYMPHATICS: No lymphadenopathy CARDIAC: ***RRR, no murmurs, rubs, gallops RESPIRATORY:  Clear to auscultation without rales, wheezing or rhonchi  ABDOMEN: Soft, non-tender, non-distended MUSCULOSKELETAL:  No edema; No deformity  SKIN: Warm and  dry NEUROLOGIC:  Alert and oriented x 3 PSYCHIATRIC:  Normal affect     Signed, Norman Herrlich, MD  07/02/2022 11:53 AM    [facility]

## 2022-07-03 ENCOUNTER — Ambulatory Visit: Payer: Medicare Other | Attending: Cardiology | Admitting: Cardiology

## 2022-09-20 IMAGING — DX DG ANKLE COMPLETE 3+V*R*
3 series · 3 of 3 positions shown · non-contrast
Comparison: None Available.

CLINICAL DATA: Pain without trauma

EXAM:
RIGHT ANKLE - COMPLETE 3+ VIEW

[ankle ap]
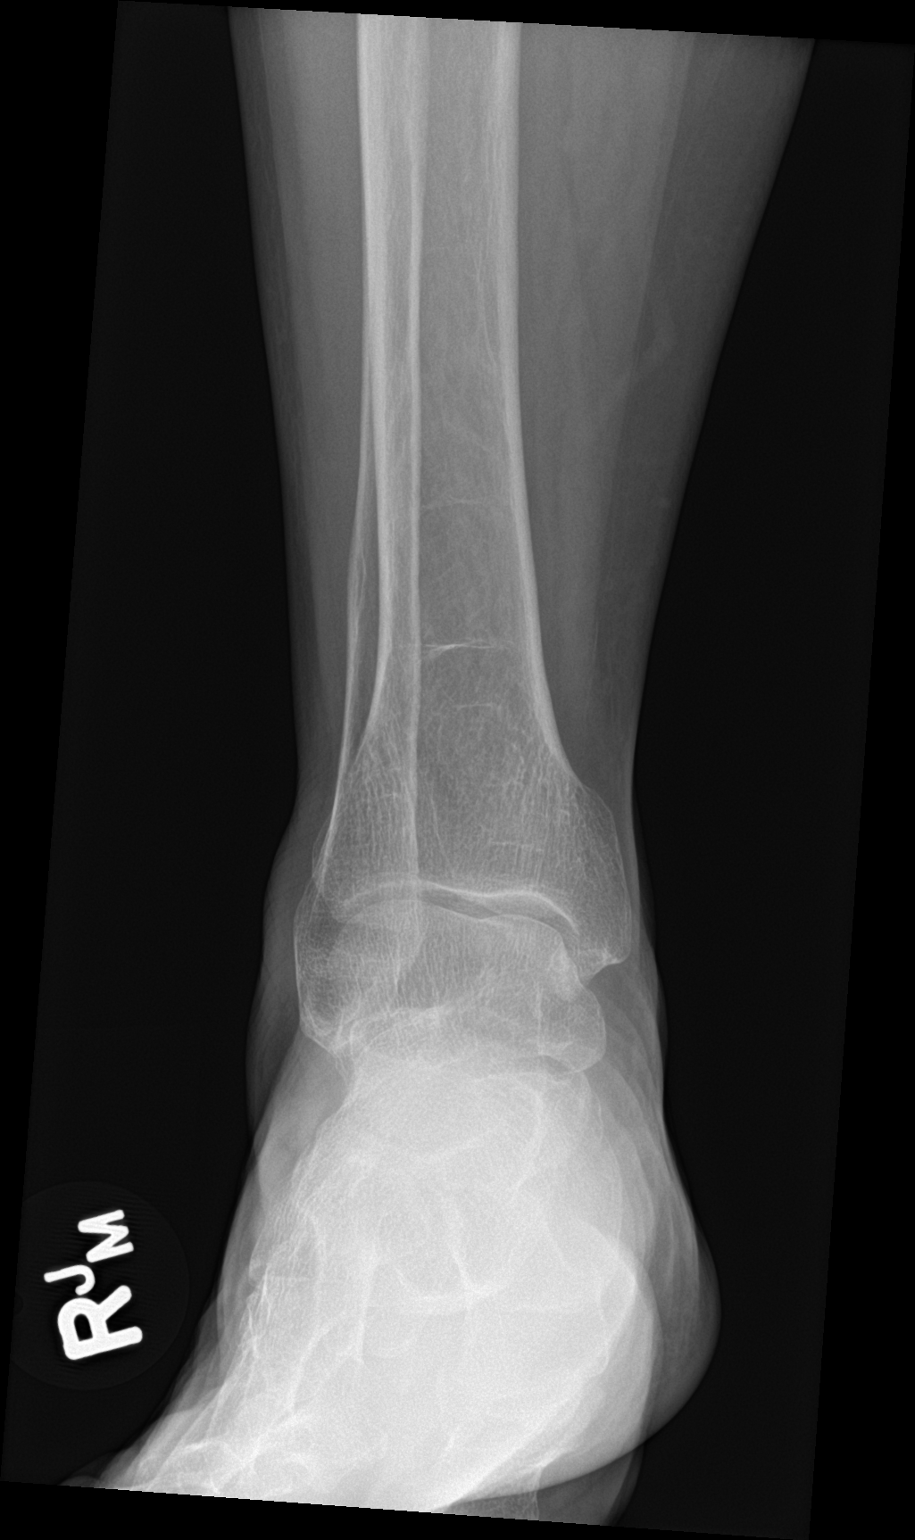

[ankle obl]
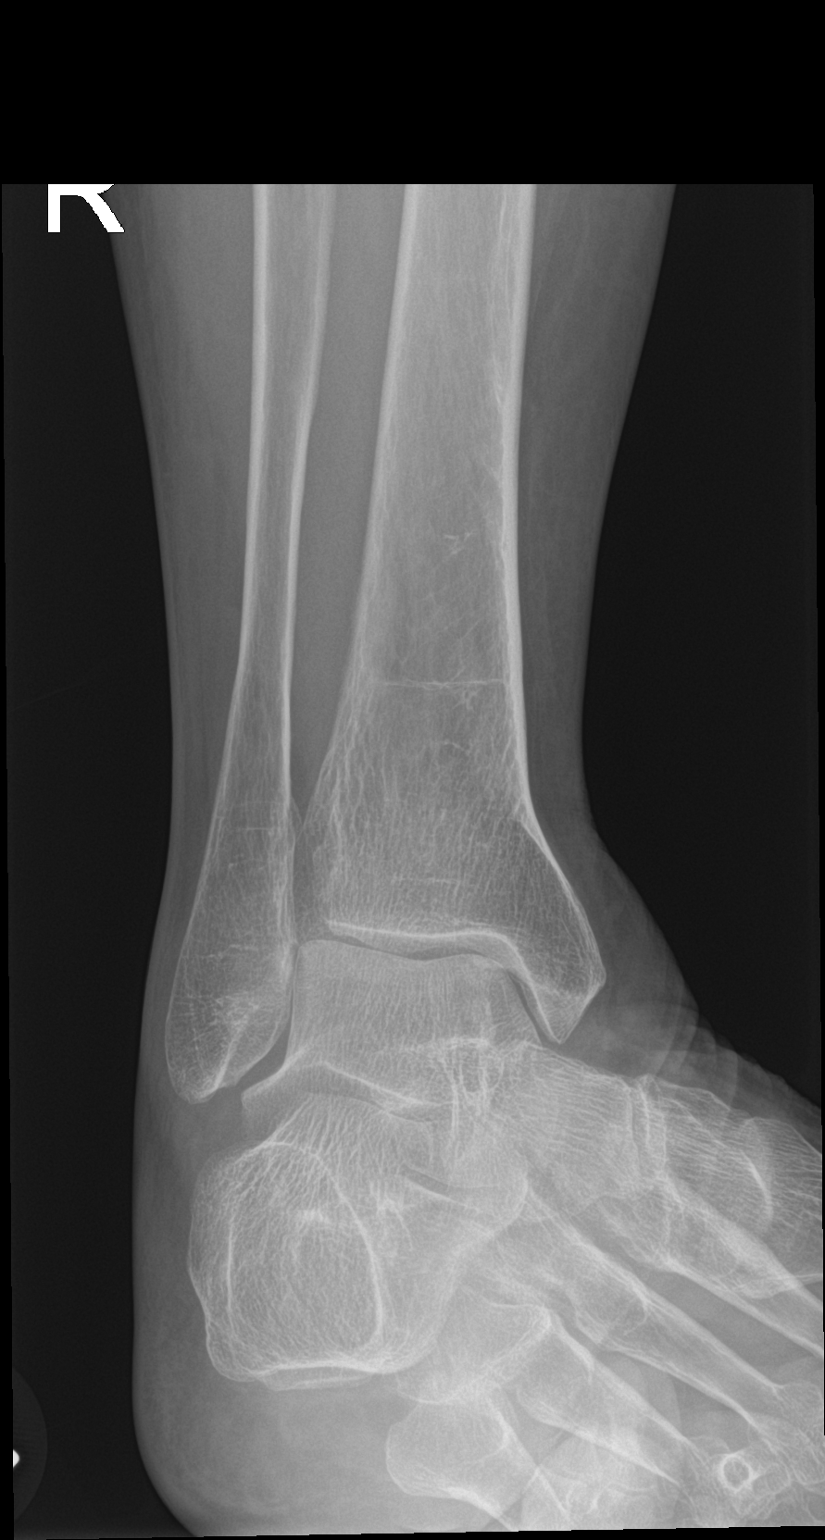

[ankle lat]
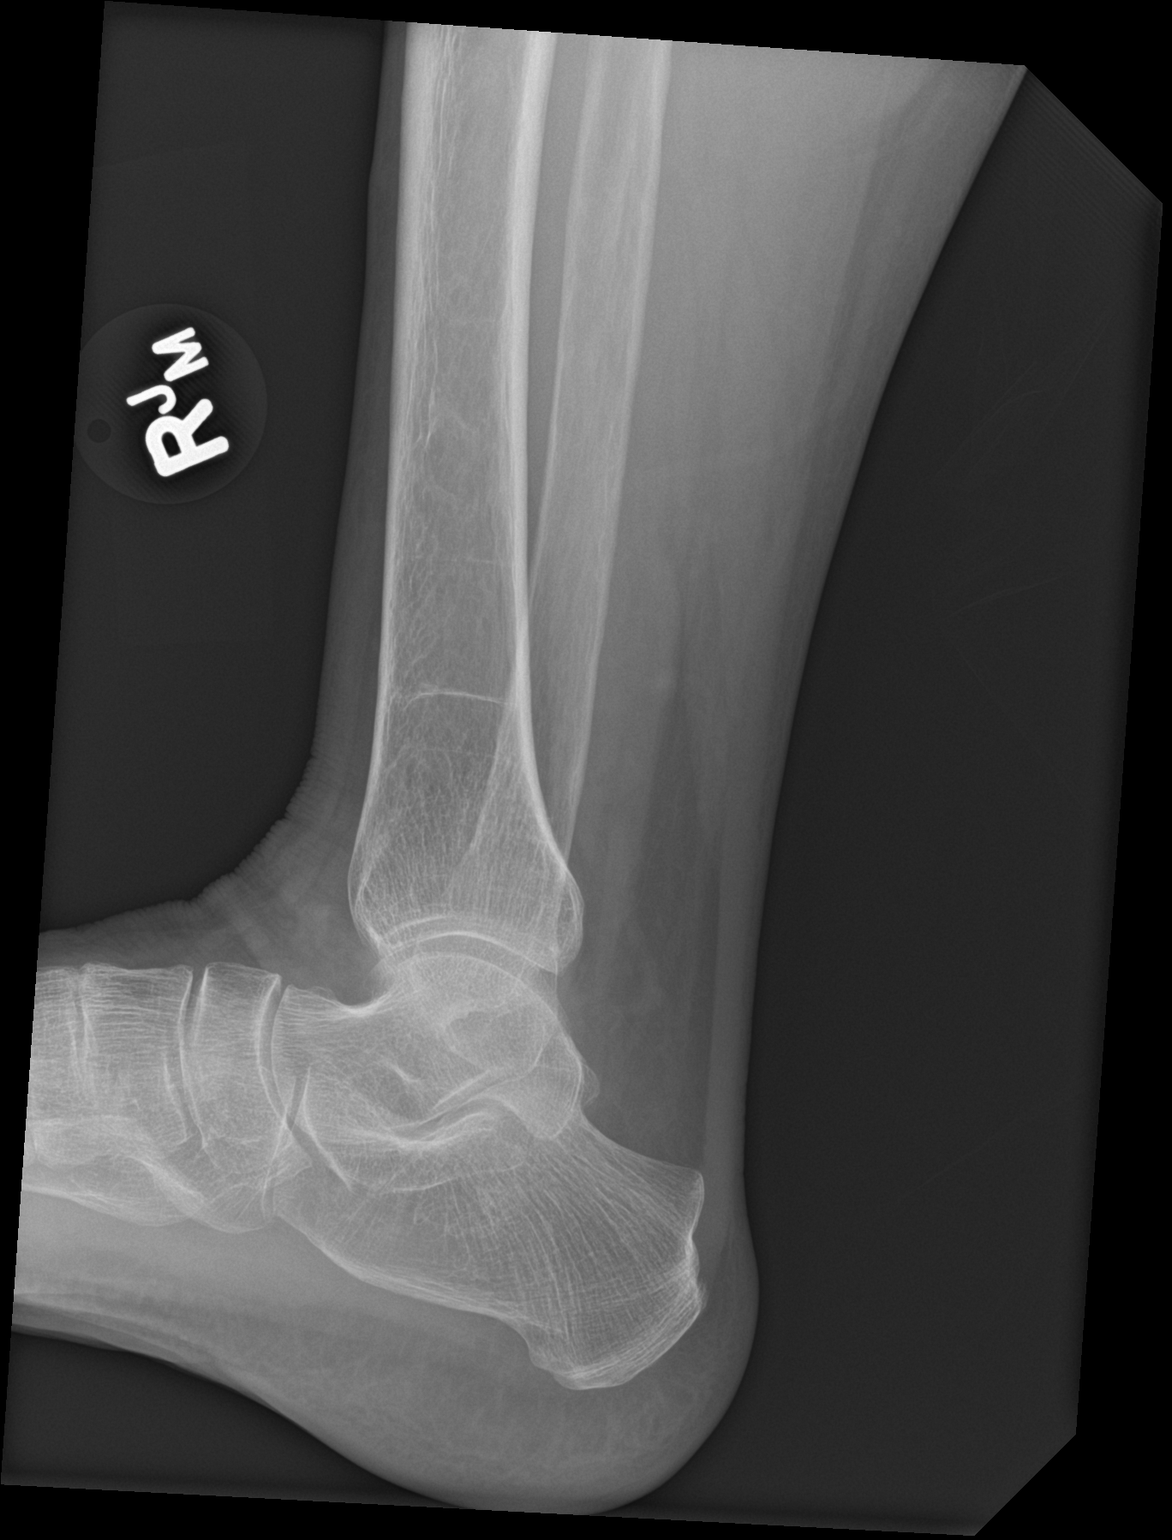

[3 of 3 positions shown; findings below may reference images not displayed]

FINDINGS: No acute fracture or dislocation. Base of fifth metatarsal and talar
dome intact. No significant soft tissue swelling.
IMPRESSION: No acute osseous abnormality.

## 2022-09-20 IMAGING — CT CT ABD-PELV W/ CM
2 of 5 series · 15 of 46 positions shown, 17 images · IV contrast (Omnipaque)
Comparison: None available.

CLINICAL DATA: Epigastric pain to the epigastrium and RIGHT upper
quadrant for 3 days.

EXAM:
CT ABDOMEN AND PELVIS WITH CONTRAST
TECHNIQUE: Multidetector CT imaging of the abdomen and pelvis was performed
using the standard protocol following bolus administration of
intravenous contrast.

[Series 2: axial st · axial · 0.82mm/px · z∈[-427,-47]mm · 12 of 88 slices shown, 14 images]
[im 6/88  soft-tissue]
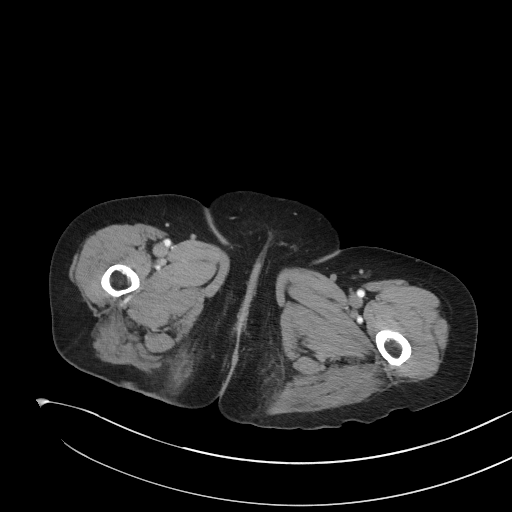
[im 6/88  bone]
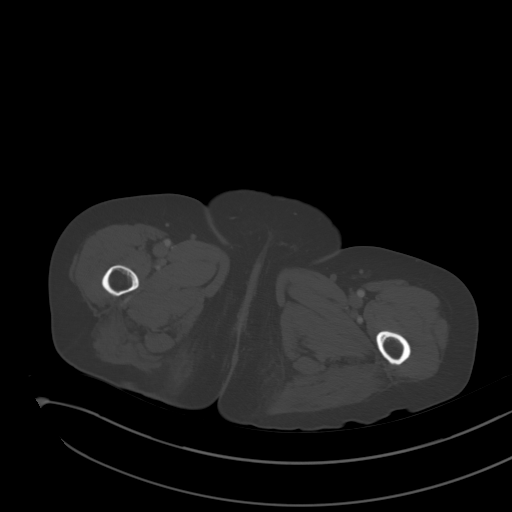
[im 16/88  soft-tissue]
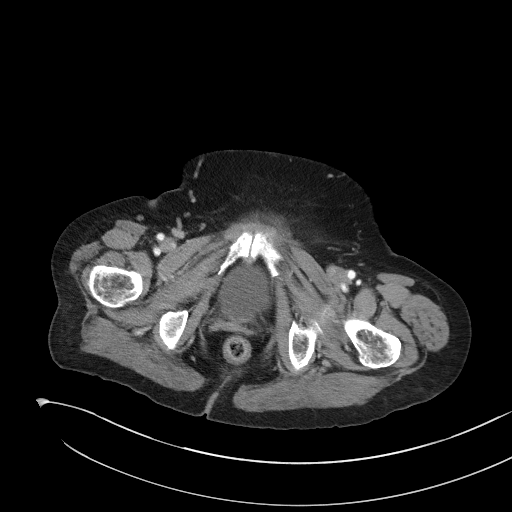
[im 21/88  soft-tissue]
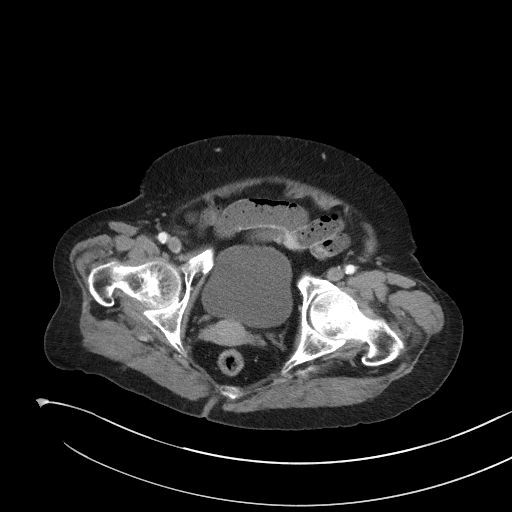
[im 26/88  soft-tissue]
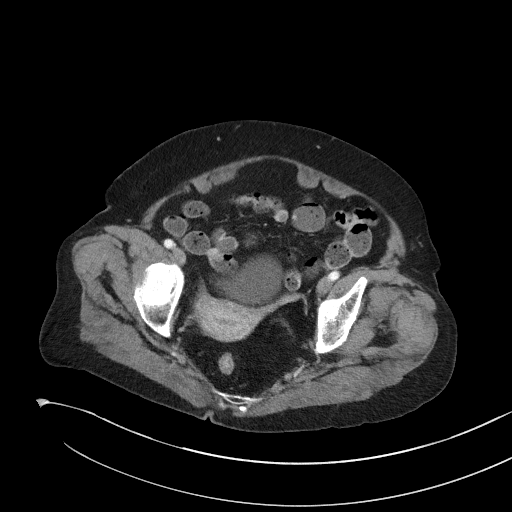
[im 36/88  soft-tissue]
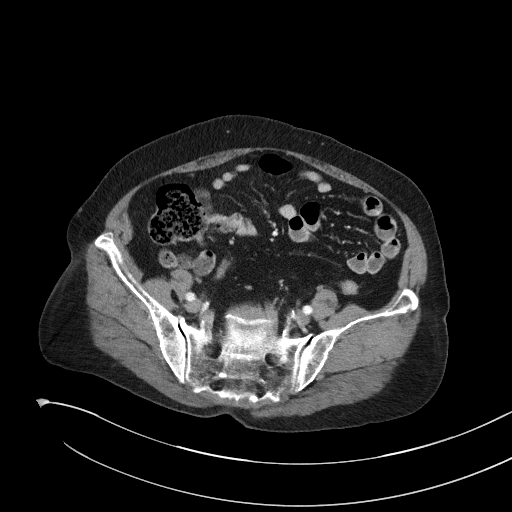
[im 41/88  soft-tissue]
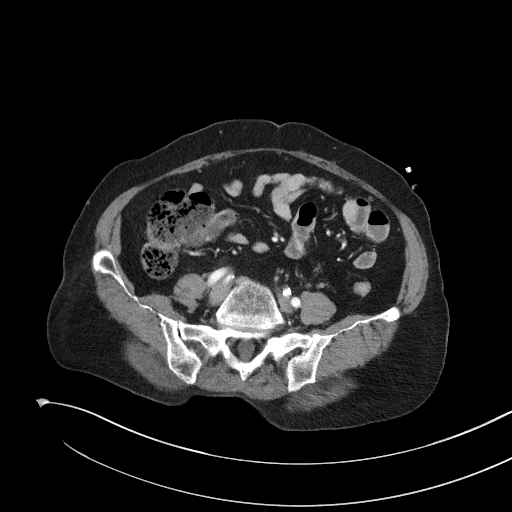
[im 47/88  soft-tissue]
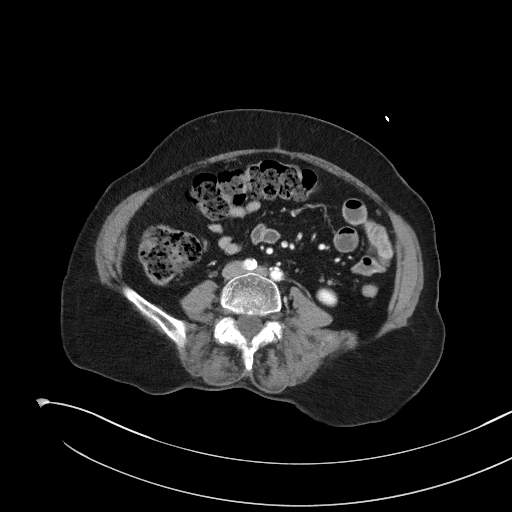
[im 57/88  soft-tissue]
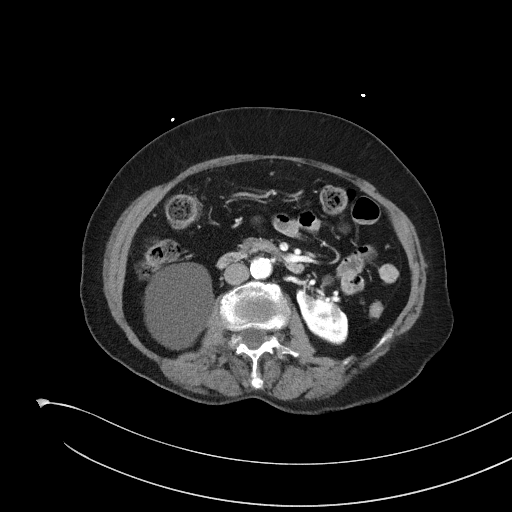
[im 62/88  soft-tissue]
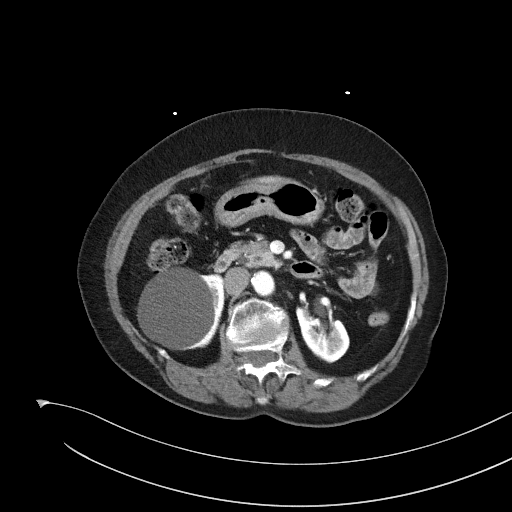
[im 62/88  bone]
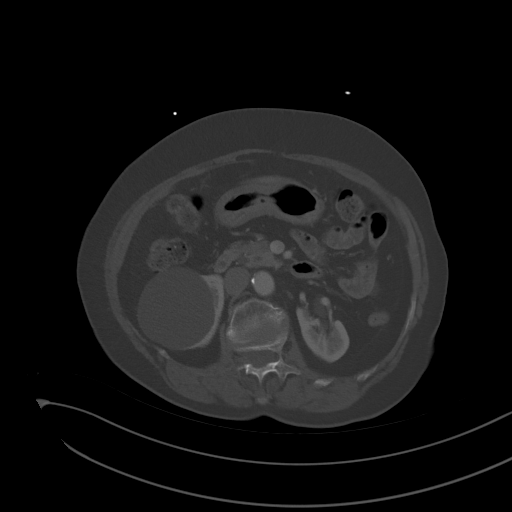
[im 67/88  soft-tissue]
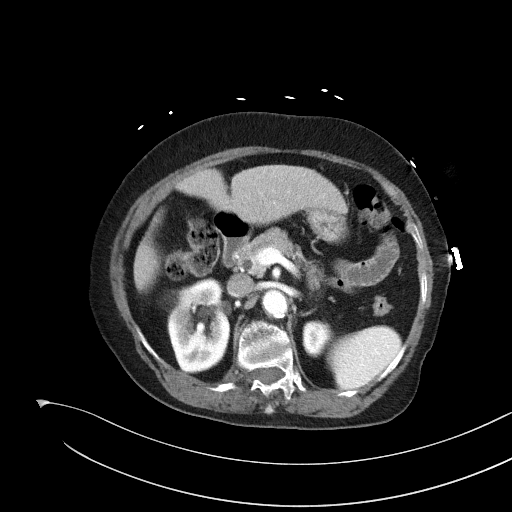
[im 77/88  soft-tissue]
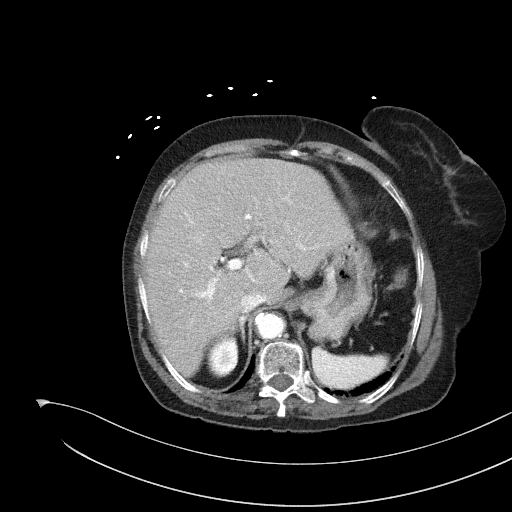
[im 82/88  soft-tissue]
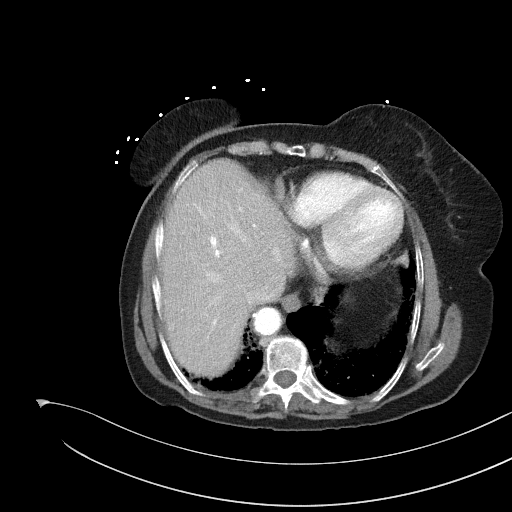

[Series 5: coronal st · coronal · 0.78mm/px · 3 of 90 slices shown]
[im 30/90  soft-tissue]
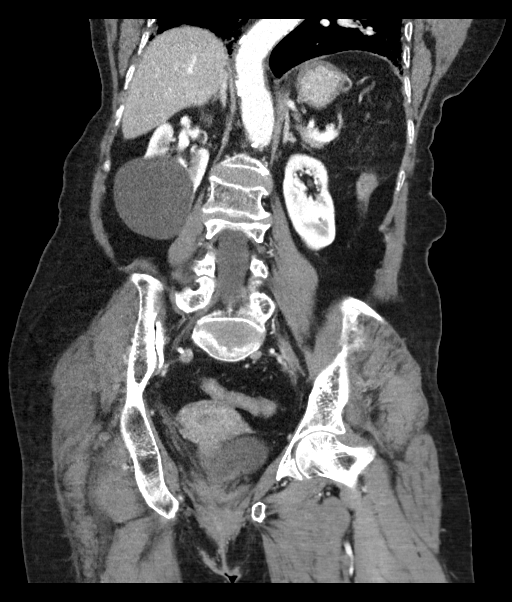
[im 40/90  soft-tissue]
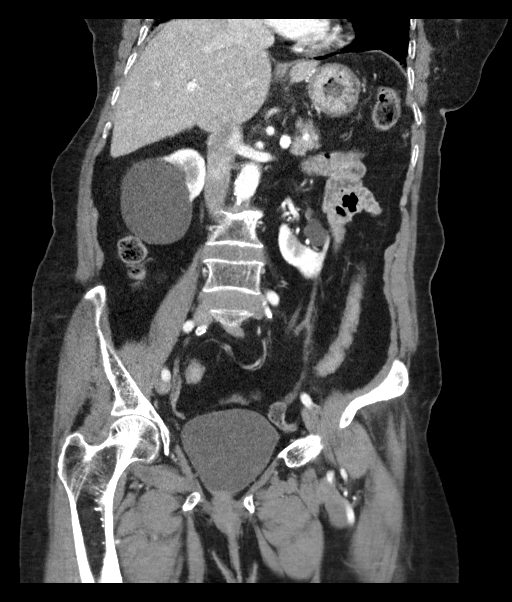
[im 50/90  soft-tissue]
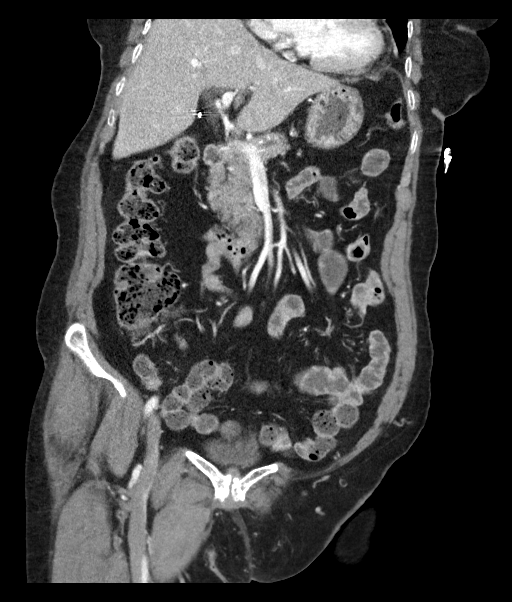

[15 of 46 positions shown; findings below may reference images not displayed]

RADIATION DOSE REDUCTION: This exam was performed according to the
departmental dose-optimization program which includes automated
exposure control, adjustment of the mA and/or kV according to
patient size and/or use of iterative reconstruction technique.

CONTRAST:  100mL OMNIPAQUE IOHEXOL 300 MG/ML  SOLN
FINDINGS: Lower chest: Basilar atelectasis with mild elevation of the RIGHT
hemidiaphragm. Heart size mildly enlarged without pericardial
effusion.

Hepatobiliary: Lobulated hepatic contours with fissural widening.
Post cholecystectomy without biliary duct distension. Portal vein is
patent.

Pancreas: Mild pancreatic atrophy. No ductal dilation or sign of
inflammation.

Spleen: Normal.

Adrenals/Urinary Tract: Adrenal glands are normal.

Symmetric renal enhancement. Large renal cyst, Bosniak category I
arises from the lower pole the RIGHT kidney 7.0 x 6.4 cm, a benign
cyst for which no additional follow-up is recommended in the absence
of localizing symptoms.

Malrotated LEFT kidney with moderate cortical loss. No LEFT-sided
hydronephrosis. No perivesical stranding or bladder wall thickening.

Stomach/Bowel: No perigastric stranding. No small bowel dilation or
signs of inflammation adjacent to small bowel loops. Signs of stool
like material in distal small bowel loops in the ileum. Mild fecal
burden in the ascending colon. No pericolonic stranding. No colonic
dilation. Appendix is normal.

Vascular/Lymphatic:

Aortic atherosclerosis. No sign of aneurysm. Smooth contour of the
IVC. There is no gastrohepatic or hepatoduodenal ligament
lymphadenopathy. No retroperitoneal or mesenteric lymphadenopathy.

No pelvic sidewall lymphadenopathy.

Atherosclerotic changes are moderate at and a mixture of calcified
and noncalcified plaque.

Reproductive: Unremarkable by CT.

Other: No ascites.  No free air.

Musculoskeletal: Osteopenia and spinal degenerative changes.
Compression fractures in the lower thoracic spine are similar to
chest radiograph from Wednesday July, 2020. Also similar to CT imaging
from Saturday March, 2019.
IMPRESSION: 1. No acute abnormality in the abdomen or pelvis.
2. Signs of stool like material in distal small bowel loops in the
ileum. This can be seen in the setting of delayed enteric transit
and or constipation. Only moderate stool burden seen in the proximal
colon on today's study.
3. Lobulated hepatic contours with fissural widening, this may be
indicative of liver disease. Correlate clinically and with
laboratory values.
4. Post cholecystectomy without biliary duct distension.
5. Aortic atherosclerosis.

Aortic Atherosclerosis (M2HJP-FPS.S).

## 2023-06-10 ENCOUNTER — Other Ambulatory Visit: Payer: Self-pay

## 2023-06-10 ENCOUNTER — Inpatient Hospital Stay (HOSPITAL_COMMUNITY): Payer: Medicare Other | Admitting: Registered Nurse

## 2023-06-10 ENCOUNTER — Emergency Department (HOSPITAL_COMMUNITY): Payer: Medicare Other

## 2023-06-10 ENCOUNTER — Encounter (HOSPITAL_COMMUNITY)
Admission: EM | Disposition: A | Discharge status: Rehabilitation Facility | Payer: Self-pay | Source: Home / Self Care | Attending: Internal Medicine

## 2023-06-10 ENCOUNTER — Inpatient Hospital Stay (HOSPITAL_COMMUNITY): Payer: Medicare Other

## 2023-06-10 ENCOUNTER — Inpatient Hospital Stay (HOSPITAL_COMMUNITY)
Admission: EM | Admit: 2023-06-10 | Discharge: 2023-06-27 | DRG: 020 | Disposition: A | Discharge status: Rehabilitation Facility | Payer: Medicare Other | Attending: Internal Medicine | Admitting: Internal Medicine

## 2023-06-10 DIAGNOSIS — R7303 Prediabetes: Secondary | ICD-10-CM | POA: Diagnosis present

## 2023-06-10 DIAGNOSIS — E872 Acidosis, unspecified: Secondary | ICD-10-CM | POA: Diagnosis present

## 2023-06-10 DIAGNOSIS — E876 Hypokalemia: Secondary | ICD-10-CM | POA: Diagnosis present

## 2023-06-10 DIAGNOSIS — R739 Hyperglycemia, unspecified: Secondary | ICD-10-CM | POA: Diagnosis present

## 2023-06-10 DIAGNOSIS — R197 Diarrhea, unspecified: Secondary | ICD-10-CM | POA: Diagnosis not present

## 2023-06-10 DIAGNOSIS — R578 Other shock: Secondary | ICD-10-CM | POA: Diagnosis not present

## 2023-06-10 DIAGNOSIS — D72829 Elevated white blood cell count, unspecified: Secondary | ICD-10-CM | POA: Diagnosis not present

## 2023-06-10 DIAGNOSIS — R339 Retention of urine, unspecified: Secondary | ICD-10-CM | POA: Diagnosis not present

## 2023-06-10 DIAGNOSIS — Z781 Physical restraint status: Secondary | ICD-10-CM | POA: Diagnosis not present

## 2023-06-10 DIAGNOSIS — G9349 Other encephalopathy: Secondary | ICD-10-CM | POA: Diagnosis present

## 2023-06-10 DIAGNOSIS — Z603 Acculturation difficulty: Secondary | ICD-10-CM | POA: Diagnosis present

## 2023-06-10 DIAGNOSIS — R9431 Abnormal electrocardiogram [ECG] [EKG]: Secondary | ICD-10-CM | POA: Diagnosis present

## 2023-06-10 DIAGNOSIS — I5033 Acute on chronic diastolic (congestive) heart failure: Secondary | ICD-10-CM | POA: Diagnosis not present

## 2023-06-10 DIAGNOSIS — I609 Nontraumatic subarachnoid hemorrhage, unspecified: Secondary | ICD-10-CM

## 2023-06-10 DIAGNOSIS — Y92239 Unspecified place in hospital as the place of occurrence of the external cause: Secondary | ICD-10-CM | POA: Diagnosis present

## 2023-06-10 DIAGNOSIS — J4520 Mild intermittent asthma, uncomplicated: Secondary | ICD-10-CM | POA: Diagnosis present

## 2023-06-10 DIAGNOSIS — I6012 Nontraumatic subarachnoid hemorrhage from left middle cerebral artery: Principal | ICD-10-CM | POA: Diagnosis present

## 2023-06-10 DIAGNOSIS — J9601 Acute respiratory failure with hypoxia: Secondary | ICD-10-CM | POA: Diagnosis not present

## 2023-06-10 DIAGNOSIS — J9600 Acute respiratory failure, unspecified whether with hypoxia or hypercapnia: Secondary | ICD-10-CM | POA: Diagnosis not present

## 2023-06-10 DIAGNOSIS — G4700 Insomnia, unspecified: Secondary | ICD-10-CM | POA: Diagnosis not present

## 2023-06-10 DIAGNOSIS — D6489 Other specified anemias: Secondary | ICD-10-CM | POA: Diagnosis present

## 2023-06-10 DIAGNOSIS — D696 Thrombocytopenia, unspecified: Secondary | ICD-10-CM | POA: Diagnosis not present

## 2023-06-10 DIAGNOSIS — R29810 Facial weakness: Secondary | ICD-10-CM | POA: Diagnosis present

## 2023-06-10 DIAGNOSIS — T380X5A Adverse effect of glucocorticoids and synthetic analogues, initial encounter: Secondary | ICD-10-CM | POA: Diagnosis present

## 2023-06-10 DIAGNOSIS — I10 Essential (primary) hypertension: Secondary | ICD-10-CM | POA: Diagnosis not present

## 2023-06-10 DIAGNOSIS — I509 Heart failure, unspecified: Secondary | ICD-10-CM | POA: Diagnosis not present

## 2023-06-10 DIAGNOSIS — I5031 Acute diastolic (congestive) heart failure: Secondary | ICD-10-CM | POA: Diagnosis not present

## 2023-06-10 DIAGNOSIS — R1310 Dysphagia, unspecified: Secondary | ICD-10-CM | POA: Diagnosis present

## 2023-06-10 DIAGNOSIS — R519 Headache, unspecified: Secondary | ICD-10-CM | POA: Diagnosis not present

## 2023-06-10 DIAGNOSIS — I69018 Other symptoms and signs involving cognitive functions following nontraumatic subarachnoid hemorrhage: Secondary | ICD-10-CM | POA: Diagnosis not present

## 2023-06-10 DIAGNOSIS — J4540 Moderate persistent asthma, uncomplicated: Secondary | ICD-10-CM | POA: Diagnosis not present

## 2023-06-10 DIAGNOSIS — J9589 Other postprocedural complications and disorders of respiratory system, not elsewhere classified: Secondary | ICD-10-CM | POA: Diagnosis not present

## 2023-06-10 DIAGNOSIS — I110 Hypertensive heart disease with heart failure: Secondary | ICD-10-CM | POA: Diagnosis present

## 2023-06-10 DIAGNOSIS — R410 Disorientation, unspecified: Secondary | ICD-10-CM | POA: Diagnosis not present

## 2023-06-10 DIAGNOSIS — G8321 Monoplegia of upper limb affecting right dominant side: Secondary | ICD-10-CM | POA: Diagnosis present

## 2023-06-10 DIAGNOSIS — J690 Pneumonitis due to inhalation of food and vomit: Secondary | ICD-10-CM | POA: Diagnosis not present

## 2023-06-10 DIAGNOSIS — Z9181 History of falling: Secondary | ICD-10-CM

## 2023-06-10 DIAGNOSIS — Z79899 Other long term (current) drug therapy: Secondary | ICD-10-CM | POA: Diagnosis not present

## 2023-06-10 DIAGNOSIS — R29722 NIHSS score 22: Secondary | ICD-10-CM | POA: Diagnosis present

## 2023-06-10 DIAGNOSIS — F03A18 Unspecified dementia, mild, with other behavioral disturbance: Secondary | ICD-10-CM | POA: Diagnosis present

## 2023-06-10 DIAGNOSIS — Z9049 Acquired absence of other specified parts of digestive tract: Secondary | ICD-10-CM

## 2023-06-10 HISTORY — PX: RADIOLOGY WITH ANESTHESIA: SHX6223

## 2023-06-10 HISTORY — PX: IR NEURO EACH ADD'L AFTER BASIC UNI LEFT (MS): IMG5373

## 2023-06-10 HISTORY — PX: IR TRANSCATH/EMBOLIZ: IMG695

## 2023-06-10 HISTORY — PX: IR 3D INDEPENDENT WKST: IMG2385

## 2023-06-10 HISTORY — PX: IR ANGIOGRAM FOLLOW UP STUDY: IMG697

## 2023-06-10 LAB — URINALYSIS, ROUTINE W REFLEX MICROSCOPIC
Bilirubin Urine: NEGATIVE
Glucose, UA: 50 mg/dL — AB
Hgb urine dipstick: NEGATIVE
Ketones, ur: NEGATIVE mg/dL
Leukocytes,Ua: NEGATIVE
Nitrite: NEGATIVE
Protein, ur: NEGATIVE mg/dL
Specific Gravity, Urine: 1.033 — ABNORMAL HIGH (ref 1.005–1.030)
pH: 7 (ref 5.0–8.0)

## 2023-06-10 LAB — CBC
HCT: 39.9 % (ref 36.0–46.0)
HCT: 40.7 % (ref 36.0–46.0)
Hemoglobin: 13 g/dL (ref 12.0–15.0)
Hemoglobin: 13.1 g/dL (ref 12.0–15.0)
MCH: 28.4 pg (ref 26.0–34.0)
MCH: 28.7 pg (ref 26.0–34.0)
MCHC: 32.6 g/dL (ref 30.0–36.0)
MCHC: 32.2 g/dL (ref 30.0–36.0)
MCV: 87.3 fL (ref 80.0–100.0)
MCV: 89.1 fL (ref 80.0–100.0)
Platelets: 206 10*3/uL (ref 150–400)
Platelets: 208 10*3/uL (ref 150–400)
RBC: 4.57 MIL/uL (ref 3.87–5.11)
RBC: 4.57 MIL/uL (ref 3.87–5.11)
RDW: 13.4 % (ref 11.5–15.5)
RDW: 13.3 % (ref 11.5–15.5)
WBC: 7.6 10*3/uL (ref 4.0–10.5)
WBC: 11.1 10*3/uL — ABNORMAL HIGH (ref 4.0–10.5)
nRBC: 0 % (ref 0.0–0.2)
nRBC: 0 % (ref 0.0–0.2)

## 2023-06-10 LAB — DIFFERENTIAL
Abs Immature Granulocytes: 0.05 10*3/uL (ref 0.00–0.07)
Basophils Absolute: 0 10*3/uL (ref 0.0–0.1)
Basophils Relative: 1 %
Eosinophils Absolute: 0.5 10*3/uL (ref 0.0–0.5)
Eosinophils Relative: 7 %
Immature Granulocytes: 1 %
Lymphocytes Relative: 49 %
Lymphs Abs: 3.8 10*3/uL (ref 0.7–4.0)
Monocytes Absolute: 0.7 10*3/uL (ref 0.1–1.0)
Monocytes Relative: 9 %
Neutro Abs: 2.6 10*3/uL (ref 1.7–7.7)
Neutrophils Relative %: 33 %

## 2023-06-10 LAB — CK TOTAL AND CKMB (NOT AT ARMC)
CK, MB: 1.9 ng/mL (ref 0.5–5.0)
Total CK: 32 U/L — ABNORMAL LOW (ref 38–234)

## 2023-06-10 LAB — CBG MONITORING, ED: Glucose-Capillary: 191 mg/dL — ABNORMAL HIGH (ref 70–99)

## 2023-06-10 LAB — COMPREHENSIVE METABOLIC PANEL
ALT: 32 U/L (ref 0–44)
AST: 34 U/L (ref 15–41)
Albumin: 3.7 g/dL (ref 3.5–5.0)
Alkaline Phosphatase: 60 U/L (ref 38–126)
Anion gap: 12 (ref 5–15)
BUN: 10 mg/dL (ref 8–23)
CO2: 20 mmol/L — ABNORMAL LOW (ref 22–32)
Calcium: 8.6 mg/dL — ABNORMAL LOW (ref 8.9–10.3)
Chloride: 103 mmol/L (ref 98–111)
Creatinine, Ser: 0.95 mg/dL (ref 0.44–1.00)
GFR, Estimated: 58 mL/min — ABNORMAL LOW (ref >60)
Glucose, Bld: 155 mg/dL — ABNORMAL HIGH (ref 70–99)
Potassium: 4 mmol/L (ref 3.5–5.1)
Sodium: 135 mmol/L (ref 135–145)
Total Bilirubin: 1.3 mg/dL — ABNORMAL HIGH (ref 0.3–1.2)
Total Protein: 7.2 g/dL (ref 6.5–8.1)

## 2023-06-10 LAB — RAPID URINE DRUG SCREEN, HOSP PERFORMED
Amphetamines: NOT DETECTED
Barbiturates: NOT DETECTED
Benzodiazepines: NOT DETECTED
Cocaine: NOT DETECTED
Opiates: NOT DETECTED
Tetrahydrocannabinol: NOT DETECTED

## 2023-06-10 LAB — I-STAT CHEM 8, ED
BUN: 12 mg/dL (ref 8–23)
Calcium, Ion: 1.02 mmol/L — ABNORMAL LOW (ref 1.15–1.40)
Chloride: 104 mmol/L (ref 98–111)
Creatinine, Ser: 0.7 mg/dL (ref 0.44–1.00)
Glucose, Bld: 189 mg/dL — ABNORMAL HIGH (ref 70–99)
HCT: 39 % (ref 36.0–46.0)
Hemoglobin: 13.3 g/dL (ref 12.0–15.0)
Potassium: 4.8 mmol/L (ref 3.5–5.1)
Sodium: 137 mmol/L (ref 135–145)
TCO2: 22 mmol/L (ref 22–32)

## 2023-06-10 LAB — APTT: aPTT: 26 seconds (ref 24–36)

## 2023-06-10 LAB — PROTIME-INR
INR: 1 (ref 0.8–1.2)
Prothrombin Time: 13.4 seconds (ref 11.4–15.2)

## 2023-06-10 LAB — ETHANOL: Alcohol, Ethyl (B): <10 mg/dL (ref <10)

## 2023-06-10 LAB — PHOSPHORUS: Phosphorus: 3.2 mg/dL (ref 2.5–4.6)

## 2023-06-10 LAB — MAGNESIUM: Magnesium: 1.7 mg/dL (ref 1.7–2.4)

## 2023-06-10 SURGERY — IR WITH ANESTHESIA
Anesthesia: General

## 2023-06-10 MED ORDER — CEFAZOLIN SODIUM-DEXTROSE 2-4 GM/100ML-% IV SOLN
INTRAVENOUS | Status: AC
  Filled 2023-06-10: qty 100

## 2023-06-10 MED ORDER — NIMODIPINE 30 MG PO CAPS
60.0000 mg | ORAL_CAPSULE | ORAL | Status: DC
  Filled 2023-06-10 (×6): qty 2

## 2023-06-10 MED ORDER — ACETAMINOPHEN 160 MG/5ML PO SOLN: 650.0000 mg | ORAL | Status: DC | PRN

## 2023-06-10 MED ORDER — ACETAMINOPHEN 10 MG/ML IV SOLN
1000.0000 mg | Freq: Four times a day (QID) | INTRAVENOUS | Status: AC
  Administered 2023-06-10 – 2023-06-11 (×3): 1000 mg via INTRAVENOUS
  Filled 2023-06-10 (×6): qty 100

## 2023-06-10 MED ORDER — IOHEXOL 350 MG/ML SOLN
75.0000 mL | Freq: Once | INTRAVENOUS | Status: AC | PRN
  Administered 2023-06-10: 75 mL via INTRAVENOUS

## 2023-06-10 MED ORDER — LEVETIRACETAM IN NACL 500 MG/100ML IV SOLN
500.0000 mg | Freq: Two times a day (BID) | INTRAVENOUS | Status: DC
  Administered 2023-06-11 – 2023-06-12 (×4): 500 mg via INTRAVENOUS
  Filled 2023-06-10 (×4): qty 100

## 2023-06-10 MED ORDER — ONDANSETRON HCL 4 MG/2ML IJ SOLN
INTRAMUSCULAR | Status: AC
  Administered 2023-06-10: 4 mg
  Filled 2023-06-10: qty 2

## 2023-06-10 MED ORDER — CLEVIDIPINE BUTYRATE 0.5 MG/ML IV EMUL
0.0000 mg/h | INTRAVENOUS | Status: DC
  Administered 2023-06-10 (×2): 2 mg/h via INTRAVENOUS
  Filled 2023-06-10: qty 50
  Filled 2023-06-10: qty 100

## 2023-06-10 MED ORDER — DOCUSATE SODIUM 100 MG PO CAPS
100.0000 mg | ORAL_CAPSULE | Freq: Two times a day (BID) | ORAL | Status: DC
  Filled 2023-06-10: qty 1

## 2023-06-10 MED ORDER — CHLORHEXIDINE GLUCONATE CLOTH 2 % EX PADS
6.0000 | MEDICATED_PAD | Freq: Every day | CUTANEOUS | Status: DC
  Administered 2023-06-11 – 2023-06-26 (×16): 6 via TOPICAL

## 2023-06-10 MED ORDER — PHENYLEPHRINE HCL-NACL 20-0.9 MG/250ML-% IV SOLN
INTRAVENOUS | Status: DC | PRN
  Administered 2023-06-10: 30 ug/min via INTRAVENOUS

## 2023-06-10 MED ORDER — ORAL CARE MOUTH RINSE: 15.0000 mL | OROMUCOSAL | Status: DC | PRN

## 2023-06-10 MED ORDER — ACETAMINOPHEN 325 MG PO TABS: 650.0000 mg | ORAL_TABLET | ORAL | Status: DC | PRN

## 2023-06-10 MED ORDER — LEVETIRACETAM IN NACL 1000 MG/100ML IV SOLN
1000.0000 mg | Freq: Once | INTRAVENOUS | Status: AC
  Administered 2023-06-10: 1000 mg via INTRAVENOUS
  Filled 2023-06-10: qty 100

## 2023-06-10 MED ORDER — ONDANSETRON HCL 4 MG/2ML IJ SOLN: 4.0000 mg | Freq: Four times a day (QID) | INTRAMUSCULAR | Status: DC | PRN

## 2023-06-10 MED ORDER — DEXAMETHASONE SODIUM PHOSPHATE 10 MG/ML IJ SOLN
INTRAMUSCULAR | Status: DC | PRN
  Administered 2023-06-10: 10 mg via INTRAVENOUS

## 2023-06-10 MED ORDER — STROKE: EARLY STAGES OF RECOVERY BOOK
Freq: Once | Status: AC
  Filled 2023-06-10: qty 1

## 2023-06-10 MED ORDER — ONDANSETRON 4 MG PO TBDP: 4.0000 mg | ORAL_TABLET | Freq: Four times a day (QID) | ORAL | Status: DC | PRN

## 2023-06-10 MED ORDER — SODIUM CHLORIDE 0.9 % IV SOLN
INTRAVENOUS | Status: DC | PRN
Start: 2023-06-10 — End: 2023-06-10

## 2023-06-10 MED ORDER — IOHEXOL 300 MG/ML  SOLN
150.0000 mL | Freq: Once | INTRAMUSCULAR | Status: AC | PRN
  Administered 2023-06-10: 75 mL via INTRA_ARTERIAL

## 2023-06-10 MED ORDER — CLEVIDIPINE BUTYRATE 0.5 MG/ML IV EMUL
INTRAVENOUS | Status: AC
  Filled 2023-06-10: qty 50

## 2023-06-10 MED ORDER — PROPOFOL 10 MG/ML IV BOLUS
INTRAVENOUS | Status: DC | PRN
  Administered 2023-06-10: 90 mg via INTRAVENOUS

## 2023-06-10 MED ORDER — FENTANYL CITRATE (PF) 250 MCG/5ML IJ SOLN
INTRAMUSCULAR | Status: DC | PRN
  Administered 2023-06-10: 100 ug via INTRAVENOUS

## 2023-06-10 MED ORDER — PANTOPRAZOLE SODIUM 40 MG PO TBEC
40.0000 mg | DELAYED_RELEASE_TABLET | Freq: Every day | ORAL | Status: DC
  Administered 2023-06-15 – 2023-06-17 (×2): 40 mg via ORAL
  Filled 2023-06-10 (×2): qty 1

## 2023-06-10 MED ORDER — PANTOPRAZOLE SODIUM 40 MG IV SOLR
40.0000 mg | Freq: Every day | INTRAVENOUS | Status: DC
  Administered 2023-06-10 – 2023-06-16 (×6): 40 mg via INTRAVENOUS
  Filled 2023-06-10 (×8): qty 10

## 2023-06-10 MED ORDER — PHENYLEPHRINE 80 MCG/ML (10ML) SYRINGE FOR IV PUSH (FOR BLOOD PRESSURE SUPPORT)
PREFILLED_SYRINGE | INTRAVENOUS | Status: DC | PRN
  Administered 2023-06-10: 160 ug via INTRAVENOUS

## 2023-06-10 MED ORDER — ONDANSETRON HCL 4 MG/2ML IJ SOLN: 4.0000 mg | Freq: Once | INTRAMUSCULAR | Status: DC | PRN

## 2023-06-10 MED ORDER — FENTANYL CITRATE (PF) 100 MCG/2ML IJ SOLN: 25.0000 ug | INTRAMUSCULAR | Status: DC | PRN

## 2023-06-10 MED ORDER — SUGAMMADEX SODIUM 200 MG/2ML IV SOLN
INTRAVENOUS | Status: DC | PRN
  Administered 2023-06-10: 125 mg via INTRAVENOUS

## 2023-06-10 MED ORDER — FENTANYL CITRATE PF 50 MCG/ML IJ SOSY
12.5000 ug | PREFILLED_SYRINGE | Freq: Four times a day (QID) | INTRAMUSCULAR | Status: DC | PRN
  Administered 2023-06-11 (×2): 12.5 ug via INTRAVENOUS
  Filled 2023-06-10 (×2): qty 1

## 2023-06-10 MED ORDER — SODIUM CHLORIDE 0.9 % IV SOLN: INTRAVENOUS | Status: DC

## 2023-06-10 MED ORDER — CLEVIDIPINE BUTYRATE 0.5 MG/ML IV EMUL
INTRAVENOUS | Status: DC | PRN
  Administered 2023-06-10: 2 mg/h via INTRAVENOUS

## 2023-06-10 MED ORDER — NIMODIPINE 6 MG/ML PO SOLN
60.0000 mg | ORAL | Status: DC
  Administered 2023-06-10 – 2023-06-11 (×2): 60 mg
  Filled 2023-06-10 (×6): qty 10

## 2023-06-10 MED ORDER — ONDANSETRON HCL 4 MG/2ML IJ SOLN
INTRAMUSCULAR | Status: DC | PRN
Start: 2023-06-10 — End: 2023-06-10
  Administered 2023-06-10: 4 mg via INTRAVENOUS

## 2023-06-10 MED ORDER — ACETAMINOPHEN 650 MG RE SUPP: 650.0000 mg | RECTAL | Status: DC | PRN

## 2023-06-10 MED ORDER — ROCURONIUM BROMIDE 10 MG/ML (PF) SYRINGE
PREFILLED_SYRINGE | INTRAVENOUS | Status: DC | PRN
  Administered 2023-06-10: 10 mg via INTRAVENOUS
  Administered 2023-06-10: 60 mg via INTRAVENOUS

## 2023-06-10 NOTE — Progress Notes (Signed)
OT Cancellation Note  Patient Details Name: Yolanda [name] MRN: [id number] DOB: [birth year]   Cancelled Treatment:    Reason Eval/Treat Not Completed: Patient not medically ready (new sAH with Bedrest)  Mateo Flow 06/10/2023, 1:09 PM

## 2023-06-10 NOTE — ED Notes (Signed)
Pt had episode of vomiting with this nurse and MD Madilyn Hook at bedside. Zofran given from verbal order at bedside

## 2023-06-10 NOTE — Progress Notes (Signed)
eLink Physician-Brief Progress Note Patient Name: Yolanda [name] DOB: 1933-01-19 MRN: [id number]   Date of Service  06/10/2023  HPI/Events of Note  89 year old female that initially presented with an aneurysmal subarachnoid hemorrhage after code stroke with severe headache and right facial droop that is now status post coiling.  In the ICU for her subarachnoid hemorrhage.  Presents to the ICU on 3 L of oxygen to maintain saturation of 98%.  Briefly on phenylephrine to maintain SBP 100-1 40.  Family at bedside.  Metabolic panel consistent with mild metabolic acidosis and minimal electrolyte derangements.  Mild leukocytosis.  Imaging reviewed consistent with subarachnoid hemorrhage  eICU Interventions  Standard subarachnoid hemorrhage precautions in place-strict blood pressure control, nimotop, hourly neurochecks  Acetaminophen and fentanyl for analgesia as needed  DVT prophylaxis with SCDs GI prophylaxis with famotidine   0255 BP out of range with sleep and nimodipine, will initiate phenylephrine for SBP goal 100-140  Intervention Category Evaluation Type: New Patient Evaluation  [name] 06/10/2023, 11:52 PM

## 2023-06-10 NOTE — Transfer of Care (Signed)
Immediate Anesthesia Transfer of Care Note  Patient: Yolanda [name]  Procedure(s) Performed: IR WITH ANESTHESIA  Patient Location: PACU  Anesthesia Type:General  Level of Consciousness: drowsy  Airway & Oxygen Therapy: Patient Spontanous Breathing and Patient connected to face mask oxygen  Post-op Assessment: Report given to RN and Post -op Vital signs reviewed and stable  Post vital signs: Reviewed and stable  Last Vitals:  Vitals Value Taken Time  BP 135/50 06/10/23 1956  Temp    Pulse 76 06/10/23 1956  Resp 14 06/10/23 1956  SpO2 98 % 06/10/23 1956  Vitals shown include unfiled device data.  Last Pain:  Vitals:   06/10/23 1100  TempSrc: Oral         Complications: No notable events documented.

## 2023-06-10 NOTE — Anesthesia Procedure Notes (Signed)
Procedure Name: Intubation Date/Time: 06/10/2023 5:28 PM  Performed by: Darlina Guys, CRNAPre-anesthesia Checklist: Patient identified, Emergency Drugs available, Suction available, Patient being monitored and Timeout performed Patient Re-evaluated:Patient Re-evaluated prior to induction Oxygen Delivery Method: Circle system utilized Preoxygenation: Pre-oxygenation with 100% oxygen Induction Type: IV induction Ventilation: Mask ventilation without difficulty Laryngoscope Size: Glidescope and 3 Grade View: Grade I Tube type: Oral Tube size: 7.0 mm Number of attempts: 1 Airway Equipment and Method: Stylet and Video-laryngoscopy Placement Confirmation: ETT inserted through vocal cords under direct vision, breath sounds checked- equal and bilateral and CO2 detector Secured at: 21 cm Tube secured with: Tape Dental Injury: Teeth and Oropharynx as per pre-operative assessment  Comments: Upper dentures came out during VL. RN secured them with patient chart. Lower dentures still in mouth. Anesthesia unaware of dentures d/t language barrier

## 2023-06-10 NOTE — Op Note (Signed)
  NEUROSURGERY BRIEF OPERATIVE  NOTE   PREOP DX: Subarachnoid Hemorrhage  POSTOP DX: Same  PROCEDURE: Diagnostic cerebral angiogram  SURGEON: Dr. Lisbeth Renshaw, MD  ANESTHESIA: GETA  APPROACH: Right trans-femoral  EBL: Minimal  SPECIMENS: None  COMPLICATIONS: None  CONDITION: Stable to recovery  FINDINGS (Full report in [facility]): 1. 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

## 2023-06-10 NOTE — Progress Notes (Signed)
PT Cancellation Note  Patient Details Name: Yolanda [name] MRN: [id number] DOB: [birth year]   Cancelled Treatment:    Reason Eval/Treat Not Completed: Medical issues which prohibited therapy. Pt with new SAH, awaiting possible diagnostic angiogram. PT will hold at this time.   Arlyss Gandy 06/10/2023, 10:33 AM

## 2023-06-10 NOTE — ED Notes (Signed)
Per MD Agarwala, Cleviprex drip can be stopped due to recent BP systolic 105-120 at lowest dose

## 2023-06-10 NOTE — Sedation Documentation (Signed)
Pt under care of anesthesia. Please see charting and vitals of CRNA

## 2023-06-10 NOTE — Consult Note (Signed)
Reason for Consult: Subarachnoid hemorrhage Referring Physician: Emergency department  Yolanda [name] is an 89 y.o. female.  HPI: 89 year old female with acute onset of severe headache.  No history of trauma or fall.  Patient with 1 episode of emesis.  Continues to have severe headache and photophobia.  Patient speaks [place].  Her son is available for translation.  CT scan demonstrates evidence of a diffuse subarachnoid hemorrhage more prominent in her left sylvian fissure.  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  Past Medical History:  Diagnosis Date   Closed fracture of left proximal humerus 03/27/2021   Closed fracture of lower end of left radius with routine healing 10/31/2020   Dementia (HCC)    Mild intermittent asthma without complication 04/04/2019   OSTEOARTHRITIS 08/04/2010   Annotation: Bilateral knees and ankles Qualifier: Diagnosis of  By: Delrae Alfred MD, Elizabeth      No past surgical history on file.  No family history on file.  Social History:  reports that she has never smoked. She does not have any smokeless tobacco history on file. She reports that she does not drink alcohol and does not use drugs.  Allergies: No Known Allergies  Medications: I have reviewed the patient's current medications.  Results for orders placed or performed during the hospital encounter of 06/10/23 (from the past 48 hour(s))  CBG monitoring, ED     Status: Abnormal   Collection Time: 06/10/23  6:32 AM  Result Value Ref Range   Glucose-Capillary 191 (H) 70 - 99 mg/dL    Comment: Glucose reference range applies only to samples taken after fasting for at least 8 hours.  Ethanol     Status: None   Collection Time: 06/10/23  6:35 AM  Result Value Ref Range   Alcohol, Ethyl (B) <10 <10 mg/dL    Comment: (NOTE) Lowest detectable limit for serum alcohol is 10 mg/dL.  For medical purposes only. Performed  at [facility], [address], [place], [place] [address]   Protime-INR     Status: None   Collection Time: 06/10/23  6:35 AM  Result Value Ref Range   Prothrombin Time 13.4 11.4 - 15.2 seconds   INR 1.0 0.8 - 1.2    Comment: (NOTE) INR goal varies based on device and disease states. Performed at [facility], [address], [place], [place] [address]   APTT     Status: None   Collection Time: 06/10/23  6:35 AM  Result Value Ref Range   aPTT 26 24 - 36 seconds    Comment: Performed at [facility], [address], [place], [place] [address]  CBC     Status: None   Collection Time: 06/10/23  6:35 AM  Result Value Ref Range   WBC 7.6 4.0 - 10.5 K/uL   RBC 4.57 3.87 - 5.11 MIL/uL   Hemoglobin 13.0 12.0 - 15.0 g/dL   HCT 91.4 78.2 - 95.6 %   MCV 87.3 80.0 - 100.0 fL   MCH 28.4 26.0 - 34.0 pg   MCHC 32.6 30.0 - 36.0 g/dL   RDW 21.3 08.6 - 57.8 %   Platelets 206 150 - 400 K/uL   nRBC 0.0 0.0 - 0.2 %    Comment: Performed at [facility], [address], [place], [place] [address]  Differential     Status: None   Collection Time: 06/10/23  6:35  AM  Result Value Ref Range   Neutrophils Relative % 33 %   Neutro Abs 2.6 1.7 - 7.7 K/uL   Lymphocytes Relative 49 %   Lymphs Abs 3.8 0.7 - 4.0 K/uL   Monocytes Relative 9 %   Monocytes Absolute 0.7 0.1 - 1.0 K/uL   Eosinophils Relative 7 %   Eosinophils Absolute 0.5 0.0 - 0.5 K/uL   Basophils Relative 1 %   Basophils Absolute 0.0 0.0 - 0.1 K/uL   Immature Granulocytes 1 %   Abs Immature Granulocytes 0.05 0.00 - 0.07 K/uL    Comment: Performed at [facility], [address] [place], [place] [address]  I-stat chem 8, ED     Status: Abnormal   Collection Time: 06/10/23  6:36 AM  Result Value Ref Range   Sodium 137 135 - 145 mmol/L   Potassium 4.8 3.5 - 5.1 mmol/L   Chloride 104 98 - 111 mmol/L   BUN 12 8 - 23 mg/dL   Creatinine, Ser 6.04 0.44 - 1.00 mg/dL   Glucose, Bld 540 (H) 70 - 99 mg/dL     Comment: Glucose reference range applies only to samples taken after fasting for at least 8 hours.   Calcium, Ion 1.02 (L) 1.15 - 1.40 mmol/L   TCO2 22 22 - 32 mmol/L   Hemoglobin 13.3 12.0 - 15.0 g/dL   HCT 98.1 19.1 - 47.8 %  Urine rapid drug screen (hosp performed)     Status: None   Collection Time: 06/10/23  7:42 AM  Result Value Ref Range   Opiates NONE DETECTED NONE DETECTED   Cocaine NONE DETECTED NONE DETECTED   Benzodiazepines NONE DETECTED NONE DETECTED   Amphetamines NONE DETECTED NONE DETECTED   Tetrahydrocannabinol NONE DETECTED NONE DETECTED   Barbiturates NONE DETECTED NONE DETECTED    Comment: (NOTE) DRUG SCREEN FOR MEDICAL PURPOSES ONLY.  IF CONFIRMATION IS NEEDED FOR ANY PURPOSE, NOTIFY LAB WITHIN 5 DAYS.  LOWEST DETECTABLE LIMITS FOR URINE DRUG SCREEN Drug Class                     Cutoff (ng/mL) Amphetamine and metabolites    1000 Barbiturate and metabolites    200 Benzodiazepine                 200 Opiates and metabolites        300 Cocaine and metabolites        300 THC                            50 Performed at [facility], [address] [place], [place] [address]   Urinalysis, Routine w reflex microscopic -Urine, Clean Catch     Status: Abnormal   Collection Time: 06/10/23  7:42 AM  Result Value Ref Range   Color, Urine STRAW (A) YELLOW   APPearance CLEAR CLEAR   Specific Gravity, Urine 1.033 (H) 1.005 - 1.030   pH 7.0 5.0 - 8.0   Glucose, UA 50 (A) NEGATIVE mg/dL   Hgb urine dipstick NEGATIVE NEGATIVE   Bilirubin Urine NEGATIVE NEGATIVE   Ketones, ur NEGATIVE NEGATIVE mg/dL   Protein, ur NEGATIVE NEGATIVE mg/dL   Nitrite NEGATIVE NEGATIVE   Leukocytes,Ua NEGATIVE NEGATIVE    Comment: Performed at [facility], [address] [place], [place] [address]    CT ANGIO HEAD NECK W WO CM (CODE STROKE)  Result Date: 06/10/2023  CLINICAL DATA:  Neuro deficit with acute stroke suspected EXAM: CT ANGIOGRAPHY HEAD AND NECK WITH AND  WITHOUT CONTRAST TECHNIQUE: Multidetector CT imaging of the head and neck was performed using the standard protocol during bolus administration of intravenous contrast. Multiplanar CT image reconstructions and MIPs were obtained to evaluate the vascular anatomy. Carotid stenosis measurements (when applicable) are obtained utilizing NASCET criteria, using the distal internal carotid diameter as the denominator. RADIATION DOSE REDUCTION: This exam was performed according to the departmental dose-optimization program which includes automated exposure control, adjustment of the mA and/or kV according to patient size and/or use of iterative reconstruction technique. CONTRAST:  75mL OMNIPAQUE IOHEXOL 350 MG/ML SOLN COMPARISON:  Head CT from earlier today FINDINGS: CTA NECK FINDINGS Aortic arch: Atheromatous plaque with 3 vessel branching. Right carotid system: Atheromatous calcification at the bifurcation without stenosis or ulceration. Left carotid system: Atheromatous calcification at the bifurcation without stenosis or ulceration Vertebral arteries: Proximal subclavian atherosclerosis. The vertebrals are widely patent and smoothly contoured. Skeleton: No acute finding Other neck: No acute or worrisome finding for age. Upper chest: Negative Review of the MIP images confirms the above findings CTA HEAD FINDINGS Anterior circulation: Cerebral aneurysm: *19 mm left MCA bifurcation aneurysm which incorporates the parent vessels. The sac is fairly smooth. *8 mm right MCA bifurcation aneurysm incorporating M2 origins, with mild sac lobulation and calcification. *5 mm right PCOM origin aneurysm, elongated with fairly smooth sac. No flow reducing stenosis or generalized beading. Posterior circulation: Small vertebral and basilar arteries in the setting of fetal type left PCA. There is atheromatous irregularity bilaterally. Some turbulent flow in the left MCA aneurysm with diminished enhancement of left MCA branches compared to  the right. Venous sinuses: As permitted by contrast timing, patent. Anatomic variants: As above Review of the MIP images confirms the above findings IMPRESSION: Three cerebral aneurysms - 19 mm of the left MCA bifurcation, 8 mm at the right MCA bifurcation, and 5 mm at the right PCOM origin. The source of subarachnoid hemorrhage is not certain based on the pattern of blood or sac irregularity. Electronically Signed   By: Tiburcio Pea M.D.   On: 06/10/2023 07:20   CT HEAD CODE STROKE WO CONTRAST  Result Date: 06/10/2023 CLINICAL DATA:  Code stroke.  Awoke with terrible headache. EXAM: CT HEAD WITHOUT CONTRAST TECHNIQUE: Contiguous axial images were obtained from the base of the skull through the vertex without intravenous contrast. RADIATION DOSE REDUCTION: This exam was performed according to the departmental dose-optimization program which includes automated exposure control, adjustment of the mA and/or kV according to patient size and/or use of iterative reconstruction technique. COMPARISON:  03/20/2021 FINDINGS: Brain: Large volume of subarachnoid hemorrhage centered at the basal cisterns and inter hemispheric/sylvian fissures. No hydrocephalus or visible infarct. Vascular: Left MCA aneurysm as isodense defect central to hemorrhage along the lower left sylvian fissure. Skull: Negative for acute finding Sinuses/Orbits: Negative Other: Critical Value/emergent results were called by telephone at the time of interpretation on 06/10/2023 at 6:52 am to provider Dr Derry Lory, who verbally acknowledged these results. IMPRESSION: Large volume aneurysmal pattern subarachnoid hemorrhage which is diffuse. Known large left MCA bifurcation aneurysm. No hydrocephalus. Electronically Signed   By: Tiburcio Pea M.D.   On: 06/10/2023 06:53    Pertinent items noted in HPI and remainder of comprehensive ROS otherwise negative. Blood pressure (!) 123/58, pulse 90, resp. rate 20, height 4\' 8"  (1.422 m), weight 58 kg, SpO2  97%. Patient is somnolent but awakens easily.  She is  oriented to person and place and time.  Her speech is reasonably fluent.  Pupils are 2 mm and symmetric bilaterally.  Gaze is conjugate.  Extraocular movements appear full.  Facial movement and sensation normal bilaterally.  Tongue protrudes to the midline.  Palate elevates symmetrically.  Motor examination of her extremities with 5/5 strength bilaterally.  No pronator drift.  Examination head ears eyes nose throat demonstrates no evidence of trauma.  Oropharynx, nasopharynx and external auditory canals are clear.  Chest and abdomen are benign appearing.  Extremities free from injury or deformity.  Assessment/Plan: Grade 3 subarachnoid hemorrhage likely secondary to MCA aneurysm rupture.  I discussed situation with Dr. Conchita Paris.  He will evaluate for possible cerebral arteriogram and possible intravascular intervention.  Recommend ICU admission.  Begin nimodipine and blood pressure control.  Sherilyn Cooter A Danise Dehne 06/10/2023, 8:26 AM

## 2023-06-10 NOTE — ED Notes (Signed)
Pt to IR with IR staff

## 2023-06-10 NOTE — Consult Note (Addendum)
NEUROLOGY CONSULTATION NOTE   Date of service: June 10, 2023 Patient Name: Yolanda [name] MRN:  [id number] DOB:  26-Sep-1933 Reason for consult: "R facial droop, splitting headache, vomiting" Requesting Provider: No att. providers found _ _ _   _ __   _ __ _ _  __ __   _ __   __ _  History of Present Illness  Yolanda [name] is a 89 y.o. female with PMH significant for dementia, prior hx of falls and fractures, osteoarthritis, who per EMS went to bed at 2200 on 06/10/23 and woke up with splitting headache, R facial droop. Family does not speak english but were able to call 911. She threw up when loading into EMS truck. SBP was in 180s. Brought in as a code stroke.  CT Head with diffuse SAH. 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  LKW: 2200 on 06/10/23 mRS: unclear. tNKASE: not offered 2/2 SAH Thrombectomy: not offered 2/2 SAH NIHSS components Score: Comment  1a Level of Conscious 0[x]  1[]  2[]  3[]      1b LOC Questions 0[]  1[]  2[x]       1c LOC Commands 0[]  1[]  2[x]       2 Best Gaze 0[x]  1[]  2[]       3 Visual 0[x]  1[]  2[]  3[]      4 Facial Palsy 0[]  1[]  2[x]  3[]      5a Motor Arm - left 0[]  1[]  2[x]  3[]  4[]  UN[]    5b Motor Arm - Right 0[]  1[]  2[x]  3[]  4[]  UN[]    6a Motor Leg - Left 0[]  1[]  2[]  3[x]  4[]  UN[]    6b Motor Leg - Right 0[]  1[]  2[]  3[x]  4[]  UN[]    7 Limb Ataxia 0[x]  1[]  2[]  3[]  UN[]     8 Sensory 0[x]  1[]  2[]  UN[]      9 Best Language 0[]  1[]  2[]  3[x]      10 Dysarthria 0[]  1[]  2[x]  UN[]      11 Extinct. and Inattention 0[x]  1[]  2[]       TOTAL:          ROS   Unable to obtain 2/2 language barrier and no nepali interpretor available.  Past History   Past Medical History:  Diagnosis Date   Closed fracture of left proximal humerus 03/27/2021   Closed fracture of lower end of left radius with routine healing 10/31/2020   Dementia (HCC)    Mild intermittent asthma without complication 04/04/2019   OSTEOARTHRITIS 08/04/2010   Annotation:  Bilateral knees and ankles Qualifier: Diagnosis of  By: Delrae Alfred MD, Elizabeth     No past surgical history on file. No family history on file. Social History   Socioeconomic History   Marital status: Married    Spouse name: Not on file   Number of children: Not on file   Years of education: Not on file   Highest education level: Not on file  Occupational History   Not on file  Tobacco Use   Smoking status: Never   Smokeless tobacco: Not on file  Substance and Sexual Activity   Alcohol use: No   Drug use: No   Sexual activity: Not on file  Other Topics Concern   Not on file  Social History Narrative   Not on file   Social Determinants of Health   Financial Resource Strain: Not on file  Food Insecurity: Not on file  Transportation Needs: Not on file  Physical Activity: Not on file  Stress: Not on file  Social Connections:  Not on file   No Known Allergies  Medications  (Not in a hospital admission)    Vitals   Vitals:   06/10/23 0600  Weight: 58 kg     Body mass index is 28.67 kg/m.  Physical Exam   General: appears in pain, eyes closed. HENT: Normal oropharynx and mucosa. Normal external appearance of ears and nose.  Neck: Supple, no pain or tenderness  CV: No JVD. No peripheral edema.  Pulmonary: Symmetric Chest rise. Normal respiratory effort.  Abdomen: Soft to touch, non-tender.  Ext: No cyanosis, edema, or deformity  Skin: No rash. Normal palpation of skin.   Musculoskeletal: Normal digits and nails by inspection. No clubbing.   Neurologic Examination  Mental status/Cognition: Alert, does not make eye contact to voice. Does not answer orientation questions. This could be due to language barrier or due to confusion. Speech/language: no speech, will say ouch to moving her head. Cranial nerves:   CN II Pupils equal and reactive to light, blinks to threat BL   CN III,IV,VI No gaze preference, EOMI intact to dolls eyes   CN V Corneals intact BL   CN  VII R facial asymmetry/droop noted on facial grimace.   CN VIII Does not make eye contact to speech   CN IX & X Unable to assess. Seems to be protecting her airway.   CN XI Head is midline   CN XII Unable to assess.   Sensory/Motor:  Muscle bulk: poor, tone normal BL upper extremities drift down to the bed when held up off the bed. BL lower ext withdraws to pinch.   Coordination/Complex Motor:  Unable to assess.  Labs   CBC:  Recent Labs  Lab 06/10/23 0635 06/10/23 0636  WBC 7.6  --   NEUTROABS 2.6  --   HGB 13.0 13.3  HCT 39.9 39.0  MCV 87.3  --   PLT 206  --     Basic Metabolic Panel:  Lab Results  Component Value Date   NA 137 06/10/2023   K 4.8 06/10/2023   CO2 25 04/29/2022   GLUCOSE 189 (H) 06/10/2023   BUN 12 06/10/2023   CREATININE 0.70 06/10/2023   CALCIUM 9.8 04/29/2022   GFRNONAA >60 04/29/2022   Lipid Panel: No results found for: "LDLCALC" HgbA1c: No results found for: "HGBA1C" Urine Drug Screen: No results found for: "LABOPIA", "COCAINSCRNUR", "LABBENZ", "AMPHETMU", "THCU", "LABBARB"  Alcohol Level No results found for: "ETH"  CT Head without contrast(Personally reviewed): Diffuse SAH  CT angio Head and Neck with contrast(Personally reviewed): L MCA bifurcation aneurysm, R MCA aneurysm along with a Pcomm aneurysm.   Impression   Yolanda [name] is a 89 y.o. female with PMH significant for dementia, prior hx of falls and fractures, osteoarthritis who presents with splitting headache and R facial droop. Went to bed at 2200 and woke up at 0600 with these symptoms. Found to have diffuse SAH and mulitple aneurysm with the largest at L MCA bifurcation. Unclear which aneurysm ruptured with the noted extensive diffuse SAH. Recommendations  - SBP below 140 - consult Neurosurgery for cerebral angiogram and potential clipping/coiling. - Zofran PRN for nausea. - we will be available on an as needed basis but do not plan to actively follow up. Please reach  out to [place] with any questions or concerns or if you would like neurology team to reengage and re-evaluate the patient. ______________________________________________________________________  Plan discussed with Dr. Madilyn Hook with the ED team.  This patient is critically ill and at  significant risk of neurological worsening, death and care requires constant monitoring of vital signs, hemodynamics,respiratory and cardiac monitoring, neurological assessment, discussion with family, other specialists and medical decision making of high complexity. I spent 40 minutes of neurocritical care time  in the care of  this patient. This was time spent independent of any time provided by nurse practitioner or PA.  Erick Blinks [facility] 06/10/2023  7:07 AM  Thank you for the opportunity to take part in the care of this patient. If you have any further questions, please contact the neurology consultation attending.  Signed,  Erick Blinks [facility] _ _ _   _ __   _ __ _ _  __ __   _ __   __ _

## 2023-06-10 NOTE — ED Triage Notes (Signed)
Pt BIBGEMS from home after waking up this AM at 0600 with a sudden severe headache. Last known well 2200 06/09/2023. Pt had one episode of emesis en route. Pt was cool, pale, and diaphoretic upon EMS arrival. Pt has decreased sensation on the right sided facial droop, and weak on the right side.  170/90  Bilateral 20 g IVs

## 2023-06-10 NOTE — ED Provider Notes (Signed)
Patient signed out to me at 0700 pending neurosurgery consultation. In short this is an 89 year old Nepali speaking female with mild dementia who presented to the emergency department with headache, R-sided facial droop and nausea. LKW 10 PM. Initially made a stroke alert and found to have large SAH with multiple aneurysms. Neurology recommended neurosurgery consult for management. Cleviprex ordered for BP control, she was given Zofran for nausea and Keppra for seizure prophylaxis. Son at bedside now.  7:40 AM I spoke with Meghan PA with neurosurgery who will evaluate the patient. Recommended admission to ICU.  Clinical Course as of 06/10/23 0824  Mon Jun 10, 2023  [id number] I spoke with Huntley Dec from [facility] who will evaluate the patient for admission. [VK]    Clinical Course User Index [VK] Rexford Maus, DO      Rexford Maus, [place] 06/10/23 [phone]

## 2023-06-10 NOTE — Sedation Documentation (Signed)
Pt still intubated, unable to assess neuro status

## 2023-06-10 NOTE — Anesthesia Preprocedure Evaluation (Addendum)
Anesthesia Evaluation  Patient identified by MRN, date of birth, ID band Patient unresponsive  General Assessment Comment:Language barrier, son consented  Reviewed: Allergy & Precautions, H&P , NPO status , Patient's Chart, lab work & pertinent test results  History of Anesthesia Complications Negative for: history of anesthetic complications  Airway Mallampati: III  TM Distance: >3 FB Neck ROM: Full    Dental no notable dental hx.    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  Family did not notice any associated facial droop or weakness however upon admission, patient was noted to have right-sided weakness and right-sided facial droop.    GI/Hepatic negative GI ROS, Neg liver ROS,,,  Endo/Other  negative endocrine ROS    Renal/GU negative Renal ROS  negative genitourinary   Musculoskeletal  (+) Arthritis ,    Abdominal   Peds negative pediatric ROS (+)  Hematology negative hematology ROS (+) Hb 13.3   Anesthesia Other Findings   Reproductive/Obstetrics negative OB ROS                             Anesthesia Physical Anesthesia Plan  ASA: 4  Anesthesia Plan: General   Post-op Pain Management: Minimal or no pain anticipated   Induction: Intravenous  PONV Risk Score and Plan: Treatment may vary due to age or medical condition and Ondansetron  Airway Management Planned: Oral ETT  Additional Equipment: Arterial line  Intra-op Plan:   Post-operative Plan: Possible Post-op intubation/ventilation  Informed Consent: I have reviewed the patients History and Physical, chart, labs and discussed the procedure including the risks, benefits and alternatives for the proposed  anesthesia with the patient or authorized representative who has indicated his/her understanding and acceptance.     Dental advisory given and Consent reviewed with POA  Plan Discussed with: CRNA  Anesthesia Plan Comments:         Anesthesia Quick Evaluation

## 2023-06-10 NOTE — H&P (Signed)
NAMELenora [name], MRN:  [id number], DOB:  10-Jun-1933, LOS: 0 ADMISSION DATE:  06/10/2023, CONSULTATION DATE:  06/10/2023 REFERRING MD:  Ivan Croft, CHIEF COMPLAINT:  Subarachnoid Hemorrhage with multiple aneurysms/ HTN   History of Present Illness:  89 year old female with history of mild asthma and HTN presents 06/10/2023 to the ED with acute onset of severe headache. Pt. Was last known normal at 10 PM 8/18. She awoke at 6 am 06/10/2023 with severe HA. No history of trauma or fall. Patient with several  episodes of emesis requiring antiemetics. Initial NIH score on arrival to the ED was 22, since arriving to the ED patient is much more alert, following commands. She initially had right sided weakness , that is less evident on my exam. Right facial droop.  Initial Hunt Hess Score is 3. Continues to have severe headache and photophobia. Patient speaks [place]., her son translates.   CT scan demonstrates evidence of a diffuse Grade 3 subarachnoid hemorrhage more prominent in her left sylvian fissure. 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   Neurosurgery has been consulted to manage subarachnoid bleed and aneurysm management. PCCM have been asked to admit and manage ICU care.   Pertinent  Medical History   Past Medical History:  Diagnosis Date   Closed fracture of left proximal humerus 03/27/2021   Closed fracture of lower end of left radius with routine healing 10/31/2020   Dementia (HCC)    Mild intermittent asthma without complication 04/04/2019   OSTEOARTHRITIS 08/04/2010   Annotation: Bilateral knees and ankles Qualifier: Diagnosis of  By: Delrae Alfred MD, [facility] Events: Including procedures, antibiotic start and stop dates in addition to other pertinent events   06/10/2023 Admission   Interim History / Subjective:  Pt. Is stable , on Cleviprex gtt.  BP is 129/54, RR 22 Pt.  Has blanket over her head and she is   Objective   Blood pressure (!) 129/54, pulse 88, resp. rate (!) 22, height 4\' 8"  (1.422 m), weight 58 kg, SpO2 94%.        Intake/Output Summary (Last 24 hours) at 06/10/2023 0844 Last data filed at 06/10/2023 0813 Gross per 24 hour  Intake 100 ml  Output --  Net 100 ml   Filed Weights   06/10/23 0600 06/10/23 0725  Weight: 58 kg 58 kg    Examination: General: Elderly female supine on stretcher with blanket pulled over her head. She is in NAD HENT:  NCAT, MM pink and dry, no LAD, Pupils 2 mm and reactive, right facial droop.   Lungs:  Bilateral chest excursion , Clear, diminished per bases Cardiovascular: S1, S2, RRR No MRG Abdomen:  Soft, NT, ND, BS +, Body mass index is 28.67 kg/m.  Extremities:  No obvious deformities, Brisk cap refill  Neuro:  MAE x 4, No drift , equal strength, no obvious deformities, Follows commands, answers questions appropriately GU:  Not assessed  Labs assessed in ED 8/19 WBC 7.6/ HGB 13/ PLT 206 PT 13.4/ INR 1.0 Ethanol < 10, CBG 191 Na 137/ K 4.8/ Cl 104/BUN 12/ Creatinine 0.70/ Calcium 1.02 Urine Drug Screen negative UA > Straw colored, SG 1.033, Glucose 50, Negative Nitrites  Resolved Hospital Problem list     Assessment & Plan:  Grade 3 subarachnoid hemorrhage likely secondary to MCA aneurysm rupture.  High risk vasospasm  HTN Plan Continue Cleviprex BP Goal per Neurosurgery <  140 systolic Cerebral arteriogram  and possible Intravascular intervention per neurosurgery who have seen patient  Trend Mag and Phos Q 1 Neuro exams  Seizure precautions  Aspiration precautions  Continue Keppra BID CBC daily  Fentanyl low dose prn severe pain  IV Acetaminophen for mild pain   Code discussion by Dr. Ivan Croft in ED with Son, Full Code  Airway Management  Currently on RA with adequate sats Potential for intubation for surgery Plan Implement Vent orders as needed   Nausea  Plan  Zofran as needed      Best Practice (right click and "Reselect all SmartList Selections" daily)   Diet/type: NPO DVT prophylaxis: SCD GI prophylaxis: PPI Lines: PIV Foley:  N/A Code Status:  full code Last date of multidisciplinary goals of care discussion [8/19]  Labs   CBC: Recent Labs  Lab 06/10/23 0635 06/10/23 0636  WBC 7.6  --   NEUTROABS 2.6  --   HGB 13.0 13.3  HCT 39.9 39.0  MCV 87.3  --   PLT 206  --     Basic Metabolic Panel: Recent Labs  Lab 06/10/23 0636  NA 137  K 4.8  CL 104  GLUCOSE 189*  BUN 12  CREATININE 0.70   GFR: Estimated Creatinine Clearance: 34.5 mL/min (by C-G formula based on SCr of 0.7 mg/dL). Recent Labs  Lab 06/10/23 0635  WBC 7.6    Liver Function Tests: No results for input(s): "AST", "ALT", "ALKPHOS", "BILITOT", "PROT", "ALBUMIN" in the last 168 hours. No results for input(s): "LIPASE", "AMYLASE" in the last 168 hours. No results for input(s): "AMMONIA" in the last 168 hours.  ABG    Component Value Date/Time   TCO2 22 06/10/2023 0636     Coagulation Profile: Recent Labs  Lab 06/10/23 0635  INR 1.0    Cardiac Enzymes: No results for input(s): "CKTOTAL", "CKMB", "CKMBINDEX", "TROPONINI" in the last 168 hours.  HbA1C: No results found for: "HGBA1C"  CBG: Recent Labs  Lab 06/10/23 0632  GLUCAP 191*    Review of Systems:   Through her son as Nurse, learning disability, headache with light sensitivity, nausea.  Past Medical History:  She,  has a past medical history of Closed fracture of left proximal humerus (03/27/2021), Closed fracture of lower end of left radius with routine healing (10/31/2020), Dementia (HCC), Mild intermittent asthma without complication (04/04/2019), and OSTEOARTHRITIS (08/04/2010).   Surgical History:  No past surgical history on file.   Social History:   reports that she has never smoked. She does not have any smokeless tobacco history on file. She reports that she does not drink alcohol and does not use drugs.    Family History:  Her family history is not on file.   Allergies No Known Allergies   Home Medications  Prior to Admission medications   Medication Sig Start Date End Date Taking? Authorizing Provider  Cholecalciferol (VITAMIN D3) 1.25 MG (50000 UT) CAPS Take 1 capsule by mouth once a week. 11/08/21   [provider]  diclofenac sodium (VOLTAREN) 1 % GEL Apply 1 application topically 4 (four) times daily. 4gm per dose 11/23/11   Linna Hoff, MD  Guaifenesin-Codeine 400-10 MG TABS Take 1 tablet by mouth 4 (four) times daily as needed. 04/06/12   Moreno-Coll, Adlih, MD  HYDROcodone-acetaminophen (NORCO) 5-325 MG tablet Take 1 tablet by mouth every 6 (six) hours as needed. 03/21/21   Dartha Lodge, PA-C  pantoprazole (PROTONIX) 20 MG tablet Take 1 tablet (20 mg total) by mouth daily for  14 days. 04/04/22 05/02/22  Sloan Leiter, DO  sucralfate (CARAFATE) 1 g tablet Take 1 tablet (1 g total) by mouth 4 (four) times daily -  with meals and at bedtime for 7 days. 04/04/22 05/02/22  Sloan Leiter, DO     Critical care time: 45 minutes   Bevelyn Ngo, MSN, AGACNP-BC [facility] Pulmonary/Critical Care Medicine See Amion for personal pager PCCM on call pager [phone]  06/10/2023 9:46 AM

## 2023-06-10 NOTE — ED Provider Notes (Signed)
[place] EMERGENCY DEPARTMENT AT [facility] Provider Note   CSN: [id number] Arrival date & time: 06/10/23  0630  An emergency department physician performed an initial assessment on this suspected stroke patient at [phone].  History  Chief Complaint  Patient presents with   Code Stroke    Oleva Kipp is a 89 y.o. female.  The history is provided by the EMS personnel and medical records. A language interpreter was used.  Anquanette Biondo is a 89 y.o. female who presents to the Emergency Department complaining of code stroke.  Level 5 caveat due to language barrier and altered mental status.  History is provided by EMS.  She presents to the emergency department as a code stroke, last known well at 10 PM.  She speaks [place] and lives with family who also do not speak [place].  History was very limited by EMS.  Code stroke was activated due to right-sided weakness.  She did have 1 episode of emesis for EMS on transfer to the truck.     Home Medications Prior to Admission medications   Medication Sig Start Date End Date Taking? Authorizing Provider  Cholecalciferol (VITAMIN D3) 1.25 MG (50000 UT) CAPS Take 1 capsule by mouth once a week. 11/08/21   [provider]  diclofenac sodium (VOLTAREN) 1 % GEL Apply 1 application topically 4 (four) times daily. 4gm per dose 11/23/11   Linna Hoff, MD  Guaifenesin-Codeine 400-10 MG TABS Take 1 tablet by mouth 4 (four) times daily as needed. 04/06/12   Moreno-Coll, Adlih, MD  HYDROcodone-acetaminophen (NORCO) 5-325 MG tablet Take 1 tablet by mouth every 6 (six) hours as needed. 03/21/21   Dartha Lodge, PA-C  pantoprazole (PROTONIX) 20 MG tablet Take 1 tablet (20 mg total) by mouth daily for 14 days. 04/04/22 05/02/22  Sloan Leiter, DO  sucralfate (CARAFATE) 1 g tablet Take 1 tablet (1 g total) by mouth 4 (four) times daily -  with meals and at bedtime for 7 days. 04/04/22 05/02/22  Sloan Leiter, DO      Allergies    Patient has no  known allergies.    Review of Systems   Review of Systems  Unable to perform ROS: Mental status change    Physical Exam Updated Vital Signs BP (!) 166/81   Pulse 69   Resp (!) 22   Ht 4\' 8"  (1.422 m)   Wt 58 kg   SpO2 95%   BMI 28.67 kg/m  Physical Exam Vitals and nursing note reviewed.  Constitutional:      Appearance: She is well-developed.  HENT:     Head: Normocephalic and atraumatic.  Cardiovascular:     Rate and Rhythm: Normal rate and regular rhythm.  Pulmonary:     Effort: Pulmonary effort is normal. No respiratory distress.  Abdominal:     Palpations: Abdomen is soft.     Tenderness: There is no abdominal tenderness. There is no guarding or rebound.  Musculoskeletal:        General: No tenderness.  Skin:    General: Skin is warm and dry.  Neurological:     Mental Status: She is alert.     Comments: Right-sided facial droop.  Moaning.  Looks around the room.  Moves all 4 extremities purposefully.     ED Results / Procedures / Treatments   Labs (all labs ordered are listed, but only abnormal results are displayed) Labs Reviewed  I-STAT CHEM 8, ED - Abnormal; Notable for the following  components:      Result Value   Glucose, Bld 189 (*)    Calcium, Ion 1.02 (*)    All other components within normal limits  CBG MONITORING, ED - Abnormal; Notable for the following components:   Glucose-Capillary 191 (*)    All other components within normal limits  CBC  DIFFERENTIAL  ETHANOL  PROTIME-INR  APTT  COMPREHENSIVE METABOLIC PANEL  RAPID URINE DRUG SCREEN, HOSP PERFORMED  URINALYSIS, ROUTINE W REFLEX MICROSCOPIC    EKG EKG Interpretation Date/Time:  Monday June 10 2023 07:02:55 EDT Ventricular Rate:  69 PR Interval:  200 QRS Duration:  100 QT Interval:  456 QTC Calculation: 489 R Axis:   53  Text Interpretation: Sinus rhythm Borderline ST elevation, inferior leads Borderline prolonged QT interval No significant change since last tracing Confirmed  by Elayne Snare ([id number]) on 06/10/2023 7:25:43 AM  Radiology CT ANGIO HEAD NECK W WO CM (CODE STROKE)  Result Date: 06/10/2023 CLINICAL DATA:  Neuro deficit with acute stroke suspected EXAM: CT ANGIOGRAPHY HEAD AND NECK WITH AND WITHOUT CONTRAST TECHNIQUE: Multidetector CT imaging of the head and neck was performed using the standard protocol during bolus administration of intravenous contrast. Multiplanar CT image reconstructions and MIPs were obtained to evaluate the vascular anatomy. Carotid stenosis measurements (when applicable) are obtained utilizing NASCET criteria, using the distal internal carotid diameter as the denominator. RADIATION DOSE REDUCTION: This exam was performed according to the departmental dose-optimization program which includes automated exposure control, adjustment of the mA and/or kV according to patient size and/or use of iterative reconstruction technique. CONTRAST:  75mL OMNIPAQUE IOHEXOL 350 MG/ML SOLN COMPARISON:  Head CT from earlier today FINDINGS: CTA NECK FINDINGS Aortic arch: Atheromatous plaque with 3 vessel branching. Right carotid system: Atheromatous calcification at the bifurcation without stenosis or ulceration. Left carotid system: Atheromatous calcification at the bifurcation without stenosis or ulceration Vertebral arteries: Proximal subclavian atherosclerosis. The vertebrals are widely patent and smoothly contoured. Skeleton: No acute finding Other neck: No acute or worrisome finding for age. Upper chest: Negative Review of the MIP images confirms the above findings CTA HEAD FINDINGS Anterior circulation: Cerebral aneurysm: *19 mm left MCA bifurcation aneurysm which incorporates the parent vessels. The sac is fairly smooth. *8 mm right MCA bifurcation aneurysm incorporating M2 origins, with mild sac lobulation and calcification. *5 mm right PCOM origin aneurysm, elongated with fairly smooth sac. No flow reducing stenosis or generalized beading. Posterior  circulation: Small vertebral and basilar arteries in the setting of fetal type left PCA. There is atheromatous irregularity bilaterally. Some turbulent flow in the left MCA aneurysm with diminished enhancement of left MCA branches compared to the right. Venous sinuses: As permitted by contrast timing, patent. Anatomic variants: As above Review of the MIP images confirms the above findings IMPRESSION: Three cerebral aneurysms - 19 mm of the left MCA bifurcation, 8 mm at the right MCA bifurcation, and 5 mm at the right PCOM origin. The source of subarachnoid hemorrhage is not certain based on the pattern of blood or sac irregularity. Electronically Signed   By: Tiburcio Pea M.D.   On: 06/10/2023 07:20   CT HEAD CODE STROKE WO CONTRAST  Result Date: 06/10/2023 CLINICAL DATA:  Code stroke.  Awoke with terrible headache. EXAM: CT HEAD WITHOUT CONTRAST TECHNIQUE: Contiguous axial images were obtained from the base of the skull through the vertex without intravenous contrast. RADIATION DOSE REDUCTION: This exam was performed according to the departmental dose-optimization program which includes automated exposure control, adjustment of  the mA and/or kV according to patient size and/or use of iterative reconstruction technique. COMPARISON:  03/20/2021 FINDINGS: Brain: Large volume of subarachnoid hemorrhage centered at the basal cisterns and inter hemispheric/sylvian fissures. No hydrocephalus or visible infarct. Vascular: Left MCA aneurysm as isodense defect central to hemorrhage along the lower left sylvian fissure. Skull: Negative for acute finding Sinuses/Orbits: Negative Other: Critical Value/emergent results were called by telephone at the time of interpretation on 06/10/2023 at 6:52 am to provider Dr Derry Lory, who verbally acknowledged these results. IMPRESSION: Large volume aneurysmal pattern subarachnoid hemorrhage which is diffuse. Known large left MCA bifurcation aneurysm. No hydrocephalus. Electronically  Signed   By: Tiburcio Pea M.D.   On: 06/10/2023 06:53    Procedures Procedures   CRITICAL CARE Performed by: Tilden Fossa   Total critical care time: 40 minutes  Critical care time was exclusive of separately billable procedures and treating other patients.  Critical care was necessary to treat or prevent imminent or life-threatening deterioration.  Critical care was time spent personally by me on the following activities: development of treatment plan with patient and/or surrogate as well as nursing, discussions with consultants, evaluation of patient's response to treatment, examination of patient, obtaining history from patient or surrogate, ordering and performing treatments and interventions, ordering and review of laboratory studies, ordering and review of radiographic studies, pulse oximetry and re-evaluation of patient's condition.  Medications Ordered in ED Medications  clevidipine (CLEVIPREX) infusion 0.5 mg/mL (2 mg/hr Intravenous New Bag/Given 06/10/23 0732)  levETIRAcetam (KEPPRA) IVPB 1000 mg/100 mL premix (has no administration in time range)  iohexol (OMNIPAQUE) 350 MG/ML injection 75 mL (75 mLs Intravenous Contrast Given 06/10/23 0700)  ondansetron (ZOFRAN) 4 MG/2ML injection (4 mg  Given 06/10/23 0865)    ED Course/ Medical Decision Making/ A&P                                 Medical Decision Making Amount and/or Complexity of Data Reviewed Labs: ordered. Radiology: ordered.  Risk Prescription drug management. Decision regarding hospitalization.   Patient here as a code stroke activated by EMS.  She does have right-sided facial droop.  She does have a large subarachnoid hemorrhage.  CTA demonstrates multiple large aneurysms.  She is currently protecting her airway.  Language barrier does make her examination challenging.  After patient's initial assessment patient's son arrived to the bedside.  With patient's son at the bedside she is able to follow commands  better.  She did state to him that she is cold but does not speak much.  Neurosurgery consulted for additional management of subarachnoid hemorrhage and aneurysms.  Patient started on Keppra for seizure prophylaxis, Cleviprex for blood pressure control.        Final Clinical Impression(s) / ED Diagnoses Final diagnoses:  SAH (subarachnoid hemorrhage) (HCC)    Rx / DC Orders ED Discharge Orders     None         Tilden Fossa, MD 06/10/23 [phone]

## 2023-06-10 NOTE — Progress Notes (Addendum)
NEUROSURGERY PROGRESS NOTE   History reviewed in the EMR and with patient's family at bedside.  Briefly, patient had sudden onset of severe headache this morning.  Family did not notice any associated facial droop or weakness however upon admission, patient was noted to have right-sided weakness and right-sided facial droop.  Per the patient's son, she does not have a known history of hypertension or diabetes.  No history of previous heart attack or stroke.  She does have some orthopedic issues and history of cholecystectomy.  He reports a history of dementia.  No other major surgeries.  There is no known family history of intracranial aneurysms.  EXAM:  BP (!) 140/74   Pulse 77   Resp 17   Ht 4\' 8"  (1.422 m)   Wt 58 kg   SpO2 94%   BMI 28.67 kg/m   Awake, alert,  Speech fluent, non-English speaker CN grossly intact, no obvious facial droop MAE well  IMAGING: CT head as well as CT angiogram were both personally reviewed.  CT head does demonstrate diffuse basal subarachnoid hemorrhage although there is slightly asymmetric subarachnoid hemorrhage over the left cerebral convexity.  No hydrocephalus.  CT angiogram reveals the presence of a partially calcified large left middle cerebral artery aneurysm.  There is also a wider based right middle cerebral artery aneurysm measuring 7 or 8 mm incorporating the origin of one of the M2 segments.  There is a smaller inferiorly directed right posterior communicating artery aneurysm as well.  IMPRESSION:  89 y.o. 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  While the patient does have bilateral MCA aneurysms, there is slightly asymmetric subarachnoid hemorrhage over the left hemisphere, and her initial presenting exam with right-sided facial droop and mild right weakness suggest left-sided pathology.  PLAN: - Admit to neuro ICU - SBP control < - Begin routine SAH pathway including Nimotop 60mg  Q 4 Hrs -  Will make arrangements for diagnostic angiogram possible endovascular treatment of LMCA aneurysm later this pm  I have reviewed the situation with the patient's son who does speak [place].  I have reviewed the imaging findings.  We discussed treatment options moving forward including the possibility of observation and supportive care without active aneurysm treatment given the age of this patient.  I did review with him the risk of aneurysm rerupture however without aneurysm treatment.  We also discussed the option for attempted endovascular treatment of her aneurysm.  I reviewed with them the potential for serious morbidity/mortality with endovascular treatment however treatment of her aneurysm if successful can obviate the possibility of aneurysm rerupture.  Questions from her family were answered.  They would like some time to review our discussion before agreeing to treatment of her aneurysm.  In the meantime I will make arrangements for possible treatment.    ADDENDUM: I have had another lengthy discussion with the patient's family through our online medical interpreter.  We have again reviewed the treatment options moving forward.  Ultimately, I did recommend proceeding with attempt at endovascular treatment of her ruptured left middle cerebral aneurysm as the overall risk of periprocedural serious morbidity or mortality is somewhere in the single digit percent range, while the risk of aneurysm rerupture which would likely be fatal in this 89 year old patient is up to 50% in the next 2 weeks.  All of their questions were answered.  The patient son did provide consent to proceed as above.   Lisbeth Renshaw, MD [facility]

## 2023-06-10 NOTE — Code Documentation (Signed)
Responded to Code Stroke called at 0617 for headache and R sided FD, [id number]. Pt arrived at 0630, CBG-191, NIH-21, CT head-SAH. TNK not given-SAH. CTA pending. Plan neurosurgery consult.

## 2023-06-10 NOTE — Anesthesia Procedure Notes (Signed)
Arterial Line Insertion Start/End8/19/2024 5:34 PM, 06/10/2023 5:42 PM Performed by: Val Eagle, MD, anesthesiologist  Patient location: OOR procedure area. Preanesthetic checklist: patient identified, IV checked, site marked, risks and benefits discussed, surgical consent, monitors and equipment checked, pre-op evaluation, timeout performed and anesthesia consent Left, radial was placed Catheter size: 20 G Hand hygiene performed  and maximum sterile barriers used   Attempts: 1 Procedure performed without using ultrasound guided technique. Following insertion, dressing applied and Biopatch. Post procedure assessment: normal and unchanged  Patient tolerated the procedure well with no immediate complications.

## 2023-06-11 ENCOUNTER — Inpatient Hospital Stay (HOSPITAL_COMMUNITY): Payer: Medicare Other

## 2023-06-11 ENCOUNTER — Encounter (HOSPITAL_COMMUNITY): Payer: Self-pay | Admitting: Neurosurgery

## 2023-06-11 ENCOUNTER — Other Ambulatory Visit: Payer: Self-pay

## 2023-06-11 DIAGNOSIS — I609 Nontraumatic subarachnoid hemorrhage, unspecified: Secondary | ICD-10-CM | POA: Diagnosis not present

## 2023-06-11 DIAGNOSIS — J9601 Acute respiratory failure with hypoxia: Secondary | ICD-10-CM

## 2023-06-11 HISTORY — PX: IR ANGIO INTRA EXTRACRAN SEL INTERNAL CAROTID UNI L MOD SED: IMG5361

## 2023-06-11 LAB — BASIC METABOLIC PANEL
Anion gap: 10 (ref 5–15)
Anion gap: 8 (ref 5–15)
BUN: 10 mg/dL (ref 8–23)
BUN: 12 mg/dL (ref 8–23)
CO2: 20 mmol/L — ABNORMAL LOW (ref 22–32)
CO2: 17 mmol/L — ABNORMAL LOW (ref 22–32)
Calcium: 8 mg/dL — ABNORMAL LOW (ref 8.9–10.3)
Calcium: 7.2 mg/dL — ABNORMAL LOW (ref 8.9–10.3)
Chloride: 109 mmol/L (ref 98–111)
Chloride: 114 mmol/L — ABNORMAL HIGH (ref 98–111)
Creatinine, Ser: 0.88 mg/dL (ref 0.44–1.00)
Creatinine, Ser: 0.94 mg/dL (ref 0.44–1.00)
GFR, Estimated: >60 mL/min (ref >60)
GFR, Estimated: 58 mL/min — ABNORMAL LOW (ref >60)
Glucose, Bld: 173 mg/dL — ABNORMAL HIGH (ref 70–99)
Glucose, Bld: 180 mg/dL — ABNORMAL HIGH (ref 70–99)
Potassium: 3.3 mmol/L — ABNORMAL LOW (ref 3.5–5.1)
Potassium: 3.5 mmol/L (ref 3.5–5.1)
Sodium: 139 mmol/L (ref 135–145)
Sodium: 139 mmol/L (ref 135–145)

## 2023-06-11 LAB — MAGNESIUM
Magnesium: 1.8 mg/dL (ref 1.7–2.4)
Magnesium: 2.5 mg/dL — ABNORMAL HIGH (ref 1.7–2.4)

## 2023-06-11 LAB — CBC
HCT: 38.2 % (ref 36.0–46.0)
Hemoglobin: 12.7 g/dL (ref 12.0–15.0)
MCH: 28.8 pg (ref 26.0–34.0)
MCHC: 33.2 g/dL (ref 30.0–36.0)
MCV: 86.6 fL (ref 80.0–100.0)
Platelets: 241 10*3/uL (ref 150–400)
RBC: 4.41 MIL/uL (ref 3.87–5.11)
RDW: 13.8 % (ref 11.5–15.5)
WBC: 13.2 10*3/uL — ABNORMAL HIGH (ref 4.0–10.5)
nRBC: 0 % (ref 0.0–0.2)

## 2023-06-11 LAB — LIPID PANEL
Cholesterol: 188 mg/dL (ref 0–200)
HDL: 27 mg/dL — ABNORMAL LOW (ref >40)
LDL Cholesterol: 132 mg/dL — ABNORMAL HIGH (ref 0–99)
Total CHOL/HDL Ratio: 7 RATIO
Triglycerides: 147 mg/dL (ref <150)
VLDL: 29 mg/dL (ref 0–40)

## 2023-06-11 LAB — GLUCOSE, CAPILLARY
Glucose-Capillary: 171 mg/dL — ABNORMAL HIGH (ref 70–99)
Glucose-Capillary: 215 mg/dL — ABNORMAL HIGH (ref 70–99)
Glucose-Capillary: 196 mg/dL — ABNORMAL HIGH (ref 70–99)

## 2023-06-11 LAB — BRAIN NATRIURETIC PEPTIDE: B Natriuretic Peptide: 672.5 pg/mL — ABNORMAL HIGH (ref 0.0–100.0)

## 2023-06-11 LAB — PHOSPHORUS: Phosphorus: 2.8 mg/dL (ref 2.5–4.6)

## 2023-06-11 LAB — MRSA NEXT GEN BY PCR, NASAL: MRSA by PCR Next Gen: NOT DETECTED

## 2023-06-11 MED ORDER — MIDAZOLAM HCL 2 MG/2ML IJ SOLN: 4.0000 mg | INTRAMUSCULAR | Status: AC

## 2023-06-11 MED ORDER — MIDAZOLAM HCL 2 MG/2ML IJ SOLN
INTRAMUSCULAR | Status: AC
  Filled 2023-06-11: qty 2

## 2023-06-11 MED ORDER — FUROSEMIDE 10 MG/ML IJ SOLN
20.0000 mg | Freq: Once | INTRAMUSCULAR | Status: AC
  Administered 2023-06-11: 20 mg via INTRAVENOUS
  Filled 2023-06-11: qty 2

## 2023-06-11 MED ORDER — QUETIAPINE FUMARATE 25 MG PO TABS
50.0000 mg | ORAL_TABLET | Freq: Once | ORAL | Status: AC
  Administered 2023-06-11: 50 mg via ORAL
  Filled 2023-06-11: qty 2

## 2023-06-11 MED ORDER — FENTANYL CITRATE PF 50 MCG/ML IJ SOSY: 100.0000 ug | PREFILLED_SYRINGE | INTRAMUSCULAR | Status: AC

## 2023-06-11 MED ORDER — ACETAMINOPHEN 325 MG PO TABS
ORAL_TABLET | ORAL | Status: AC
  Filled 2023-06-11: qty 2

## 2023-06-11 MED ORDER — QUETIAPINE FUMARATE 25 MG PO TABS: 25.0000 mg | ORAL_TABLET | Freq: Every day | ORAL | Status: DC

## 2023-06-11 MED ORDER — NIMODIPINE 6 MG/ML PO SOLN
60.0000 mg | ORAL | Status: DC
  Administered 2023-06-11: 60 mg via ORAL
  Filled 2023-06-11 (×2): qty 10

## 2023-06-11 MED ORDER — PHENYLEPHRINE CONCENTRATED 100MG/250ML (0.4 MG/ML) INFUSION SIMPLE
0.0000 ug/min | INTRAVENOUS | Status: DC
  Administered 2023-06-11: 90 ug/min via INTRAVENOUS
  Filled 2023-06-11: qty 250

## 2023-06-11 MED ORDER — FENTANYL BOLUS VIA INFUSION
25.0000 ug | INTRAVENOUS | Status: DC | PRN
  Administered 2023-06-12: 75 ug via INTRAVENOUS
  Administered 2023-06-12: 100 ug via INTRAVENOUS
  Administered 2023-06-12: 50 ug via INTRAVENOUS
  Administered 2023-06-12 (×2): 100 ug via INTRAVENOUS
  Administered 2023-06-12: 50 ug via INTRAVENOUS
  Administered 2023-06-13: 25 ug via INTRAVENOUS
  Administered 2023-06-13: 50 ug via INTRAVENOUS
  Administered 2023-06-13: 25 ug via INTRAVENOUS

## 2023-06-11 MED ORDER — FENTANYL CITRATE PF 50 MCG/ML IJ SOSY
PREFILLED_SYRINGE | INTRAMUSCULAR | Status: AC
  Filled 2023-06-11: qty 2

## 2023-06-11 MED ORDER — SODIUM CHLORIDE 0.9 % IV SOLN: 250.0000 mL | INTRAVENOUS | Status: DC

## 2023-06-11 MED ORDER — MIDAZOLAM HCL 2 MG/2ML IJ SOLN
INTRAMUSCULAR | Status: AC
  Administered 2023-06-11: 4 mg via INTRAVENOUS
  Filled 2023-06-11: qty 2

## 2023-06-11 MED ORDER — ROCURONIUM BROMIDE 10 MG/ML (PF) SYRINGE
PREFILLED_SYRINGE | INTRAVENOUS | Status: AC
  Administered 2023-06-11: 60 mg via INTRAVENOUS
  Filled 2023-06-11: qty 10

## 2023-06-11 MED ORDER — NIMODIPINE 6 MG/ML PO SOLN
30.0000 mg | ORAL | Status: DC
  Administered 2023-06-11 (×8): 30 mg via ORAL
  Filled 2023-06-11 (×4): qty 10

## 2023-06-11 MED ORDER — NOREPINEPHRINE 4 MG/250ML-% IV SOLN
0.0000 ug/min | INTRAVENOUS | Status: DC
  Administered 2023-06-11: 10 ug/min via INTRAVENOUS
  Filled 2023-06-11: qty 250

## 2023-06-11 MED ORDER — MAGNESIUM SULFATE 2 GM/50ML IV SOLN
2.0000 g | Freq: Once | INTRAVENOUS | Status: AC
  Administered 2023-06-11: 2 g via INTRAVENOUS
  Filled 2023-06-11: qty 50

## 2023-06-11 MED ORDER — POLYETHYLENE GLYCOL 3350 17 G PO PACK
17.0000 g | PACK | Freq: Every day | ORAL | Status: DC
  Administered 2023-06-12: 17 g
  Filled 2023-06-11: qty 1

## 2023-06-11 MED ORDER — PHENYLEPHRINE HCL-NACL 20-0.9 MG/250ML-% IV SOLN
25.0000 ug/min | INTRAVENOUS | Status: DC
  Administered 2023-06-11: 185 ug/min via INTRAVENOUS
  Administered 2023-06-11: 25 ug/min via INTRAVENOUS
  Administered 2023-06-11: 165 ug/min via INTRAVENOUS
  Administered 2023-06-11: 155 ug/min via INTRAVENOUS
  Administered 2023-06-11: 115 ug/min via INTRAVENOUS
  Administered 2023-06-11: 125 ug/min via INTRAVENOUS
  Filled 2023-06-11 (×2): qty 250
  Filled 2023-06-11: qty 500
  Filled 2023-06-11 (×2): qty 250
  Filled 2023-06-11: qty 500

## 2023-06-11 MED ORDER — FENTANYL 2500MCG IN NS 250ML (10MCG/ML) PREMIX INFUSION
INTRAVENOUS | Status: AC
  Administered 2023-06-11: 50 ug/h via INTRAVENOUS
  Filled 2023-06-11: qty 250

## 2023-06-11 MED ORDER — MIDAZOLAM HCL 2 MG/2ML IJ SOLN
1.0000 mg | INTRAMUSCULAR | Status: DC | PRN
  Administered 2023-06-12 (×2): 2 mg via INTRAVENOUS
  Filled 2023-06-11 (×2): qty 2

## 2023-06-11 MED ORDER — IOHEXOL 300 MG/ML  SOLN
100.0000 mL | Freq: Once | INTRAMUSCULAR | Status: AC | PRN
  Administered 2023-06-10: 18 mL via INTRA_ARTERIAL

## 2023-06-11 MED ORDER — NOREPINEPHRINE 16 MG/250ML-% IV SOLN
0.0000 ug/min | INTRAVENOUS | Status: DC
  Administered 2023-06-12: 16 ug/min via INTRAVENOUS
  Filled 2023-06-11 (×2): qty 250

## 2023-06-11 MED ORDER — NIMODIPINE 30 MG PO CAPS
30.0000 mg | ORAL_CAPSULE | ORAL | Status: DC
  Administered 2023-06-12: 30 mg via ORAL

## 2023-06-11 MED ORDER — FENTANYL CITRATE PF 50 MCG/ML IJ SOSY: 25.0000 ug | PREFILLED_SYRINGE | Freq: Once | INTRAMUSCULAR | Status: DC

## 2023-06-11 MED ORDER — DEXMEDETOMIDINE HCL IN NACL 400 MCG/100ML IV SOLN
0.0000 ug/kg/h | INTRAVENOUS | Status: DC
  Administered 2023-06-11: 0.4 ug/kg/h via INTRAVENOUS
  Filled 2023-06-11: qty 100

## 2023-06-11 MED ORDER — FENTANYL CITRATE PF 50 MCG/ML IJ SOSY
PREFILLED_SYRINGE | INTRAMUSCULAR | Status: AC
  Administered 2023-06-11: 100 ug via INTRAVENOUS
  Filled 2023-06-11: qty 2

## 2023-06-11 MED ORDER — NIMODIPINE 6 MG/ML PO SOLN
30.0000 mg | ORAL | Status: DC
  Administered 2023-06-12 – 2023-06-13 (×15): 30 mg
  Filled 2023-06-11 (×11): qty 10

## 2023-06-11 MED ORDER — POTASSIUM CHLORIDE 10 MEQ/100ML IV SOLN
10.0000 meq | INTRAVENOUS | Status: AC
  Administered 2023-06-11 (×4): 10 meq via INTRAVENOUS
  Filled 2023-06-11 (×4): qty 100

## 2023-06-11 MED ORDER — NIMODIPINE 30 MG PO CAPS: 30.0000 mg | ORAL_CAPSULE | ORAL | Status: DC

## 2023-06-11 MED ORDER — SODIUM CHLORIDE 0.9 % IV BOLUS
500.0000 mL | Freq: Once | INTRAVENOUS | Status: AC
  Administered 2023-06-11: 500 mL via INTRAVENOUS

## 2023-06-11 MED ORDER — QUETIAPINE FUMARATE 25 MG PO TABS: 25.0000 mg | ORAL_TABLET | Freq: Two times a day (BID) | ORAL | Status: DC

## 2023-06-11 MED ORDER — NOREPINEPHRINE 4 MG/250ML-% IV SOLN
2.0000 ug/min | INTRAVENOUS | Status: DC
  Administered 2023-06-11: 10 ug/min via INTRAVENOUS
  Administered 2023-06-11: 2 ug/min via INTRAVENOUS
  Filled 2023-06-11 (×2): qty 250

## 2023-06-11 MED ORDER — FENTANYL CITRATE PF 50 MCG/ML IJ SOSY
25.0000 ug | PREFILLED_SYRINGE | INTRAMUSCULAR | Status: DC | PRN
  Filled 2023-06-11: qty 1

## 2023-06-11 MED ORDER — DOCUSATE SODIUM 50 MG/5ML PO LIQD
100.0000 mg | Freq: Two times a day (BID) | ORAL | Status: DC
  Administered 2023-06-12 (×2): 100 mg
  Filled 2023-06-11 (×2): qty 10

## 2023-06-11 MED ORDER — MIDAZOLAM HCL 2 MG/2ML IJ SOLN: 2.0000 mg | Freq: Once | INTRAMUSCULAR | Status: DC | PRN

## 2023-06-11 MED ORDER — ACETAMINOPHEN 650 MG RE SUPP: 650.0000 mg | Freq: Four times a day (QID) | RECTAL | Status: DC | PRN

## 2023-06-11 MED ORDER — FENTANYL 2500MCG IN NS 250ML (10MCG/ML) PREMIX INFUSION
25.0000 ug/h | INTRAVENOUS | Status: DC
  Administered 2023-06-13: 25 ug/h via INTRAVENOUS
  Administered 2023-06-14: 50 ug/h via INTRAVENOUS
  Filled 2023-06-11 (×3): qty 250

## 2023-06-11 MED ORDER — MIDAZOLAM HCL 2 MG/2ML IJ SOLN
2.0000 mg | Freq: Once | INTRAMUSCULAR | Status: AC
  Administered 2023-06-11: 2 mg via INTRAVENOUS

## 2023-06-11 MED ORDER — ROCURONIUM BROMIDE 10 MG/ML (PF) SYRINGE: 60.0000 mg | PREFILLED_SYRINGE | INTRAVENOUS | Status: AC

## 2023-06-11 MED ORDER — VASOPRESSIN 20 UNITS/100 ML INFUSION FOR SHOCK
0.0000 [IU]/min | INTRAVENOUS | Status: DC
  Administered 2023-06-11 – 2023-06-12 (×2): 0.03 [IU]/min via INTRAVENOUS
  Filled 2023-06-11 (×2): qty 100

## 2023-06-11 MED ORDER — ACETAMINOPHEN 325 MG PO TABS
650.0000 mg | ORAL_TABLET | Freq: Four times a day (QID) | ORAL | Status: DC | PRN
  Administered 2023-06-11 – 2023-06-14 (×2): 650 mg via ORAL
  Filled 2023-06-11: qty 2

## 2023-06-11 MED ORDER — NIMODIPINE 30 MG PO CAPS: 60.0000 mg | ORAL_CAPSULE | ORAL | Status: DC

## 2023-06-11 MED ORDER — ALBUTEROL SULFATE (2.5 MG/3ML) 0.083% IN NEBU
2.5000 mg | INHALATION_SOLUTION | RESPIRATORY_TRACT | Status: DC | PRN
  Administered 2023-06-11 – 2023-06-12 (×2): 2.5 mg via RESPIRATORY_TRACT
  Filled 2023-06-11 (×2): qty 3

## 2023-06-11 MED ORDER — SODIUM CHLORIDE 0.9 % IV SOLN: INTRAVENOUS | Status: DC

## 2023-06-11 NOTE — Progress Notes (Signed)
PT Cancellation Note  Patient Details Name: Odelia Sigur MRN: [id number] DOB: 28-Oct-1932   Cancelled Treatment:    Reason Eval/Treat Not Completed: Patient not medically ready (started on pressors). Will follow up at later time.   Lyanne Co, PT  Acute Rehab Services Secure chat preferred Office [phone]    Lawana Chambers Thornton Dohrmann 06/11/2023, 10:10 AM

## 2023-06-11 NOTE — Progress Notes (Signed)
Attempted to use the interpreter to complete an assessment on the patient. When asking the patient her name the son at bedside responds with the patient's name and stated that the patient in unable to say her name. Son was educated on the assessment and that staff needs to be able to see what the patient is able or unable to do, including answering questions. Patient unable to answer assessment questions or follow commands but according to the family is able to tell them her needs, although patient will not respond to the interpreter.   Neurosurgery aware, do not fell that a head CT is necessary at this time.

## 2023-06-11 NOTE — Progress Notes (Signed)
Frequent communication with nursing over the course of the afternoon. Progressive agitation over the course of the afternoon despite adding seroquel, fentanyl prn. She did respond somewhat to fentanyl. However despite this precedex was started. She is directable but not able to be still enough for TCDs. Will trial versed 2 mg x1 before procedures only to help facilitate care.   Additional Cc time 25 minutes.  Durel Salts, MD Pulmonary and Critical Care Medicine [facility] 06/11/2023 3:51 PM Pager: see AMION  If no response to pager, please call critical care on call (see AMION) until 7pm After 7:00 pm call Elink

## 2023-06-11 NOTE — Progress Notes (Signed)
 An USGPIV (ultrasound guided PIV) has been placed for short-term vasopressor infusion. A correctly placed ivWatch must be used when administering Vasopressors. Should this treatment be needed beyond 24 hours, central line access should be obtained.  It will be the responsibility of the bedside nurse to follow best practice to prevent extravasations.

## 2023-06-11 NOTE — Progress Notes (Signed)
Spoke with Primary nurse about PICC placement and made her aware that we will not be able to place PICC tonight that we will attempt placement on 8/21. If central access is needed before PICC can be placed would recommend central line placement.

## 2023-06-11 NOTE — Procedures (Signed)
Intubation Procedure Note  Yolanda [name]  [id number]  1933/08/10  Date:06/11/23  Time:11:47 PM   Provider Performing:Chauna Osoria W Mikey Bussing    Procedure: Intubation (31500)  Indication(s) Respiratory Failure  Consent Risks of the procedure as well as the alternatives and risks of each were explained to the patient and/or caregiver.  Consent for the procedure was obtained and is signed in the bedside chart   Anesthesia Versed, Fentanyl, and Rocuronium   Time Out Verified patient identification, verified procedure, site/side was marked, verified correct patient position, special equipment/implants available, medications/allergies/relevant history reviewed, required imaging and test results available.   Sterile Technique Usual hand hygeine, masks, and gloves were used   Procedure Description Patient positioned in bed supine.  Sedation given as noted above.  Patient was intubated with endotracheal tube using Glidescope.  View was Grade 1 full glottis .  Number of attempts was 1.  Colorimetric CO2 detector was consistent with tracheal placement.   Complications/Tolerance None; patient tolerated the procedure well. Chest X-ray is ordered to verify placement.   EBL Minimal   Specimen(s) None   Joneen Roach, AGACNP-BC [facility]  See Amion for personal pager PCCM on call pager [phone] until 7pm. Please call Elink 7p-7a. [phone]  06/11/2023 11:47 PM

## 2023-06-11 NOTE — Progress Notes (Signed)
OT Cancellation Note  Patient Details Name: Alante Reategui MRN: [id number] DOB: January 13, 1933   Cancelled Treatment:    Reason Eval/Treat Not Completed: Patient not medically ready (Dicussed with nsg. Started on pressors. Will follow up at a later time.)  [facility] 06/11/2023, 10:07 AM Luisa Dago, OT/L   Acute OT Clinical Specialist Acute Rehabilitation Services Pager [phone] Office [phone]

## 2023-06-11 NOTE — Anesthesia Postprocedure Evaluation (Signed)
Anesthesia Post Note  Patient: Yolanda [name]  Procedure(s) Performed: IR WITH ANESTHESIA     Patient location during evaluation: PACU Anesthesia Type: General Level of consciousness: awake and alert Pain management: pain level controlled Vital Signs Assessment: post-procedure vital signs reviewed and stable Respiratory status: spontaneous breathing, nonlabored ventilation, respiratory function stable and patient connected to nasal cannula oxygen Cardiovascular status: blood pressure returned to baseline and stable Postop Assessment: no apparent nausea or vomiting Anesthetic complications: no  No notable events documented.  Last Vitals:  Vitals:   06/11/23 0030 06/11/23 0100  BP: 125/60 (!) 113/59  Pulse: 70 72  Resp: 19 20  Temp:    SpO2: 92% 98%    Last Pain:  Vitals:   06/11/23 0000  TempSrc: Oral  PainSc:                  Kennieth Rad

## 2023-06-11 NOTE — Progress Notes (Signed)
  NEUROSURGERY PROGRESS NOTE   No issues overnight. Pt remains restless, with HA but improved from yesterday. Has required vasopressor support overnight.  EXAM:  BP (!) 95/53   Pulse 70   Temp (!) 96.5 F (35.8 C) (Axillary) Comment: Reported to the nurse, warm blankets applied  Resp 20   Ht 4\' 8"  (1.422 m)   Wt 56.2 kg   SpO2 90%   BMI 27.78 kg/m   Awake, alert, oriented x 2-3 Speech fluent in [place] CN grossly intact  MAE well  IMPRESSION:  89 y.o. female SAH d#2 s/p subtotal coil embolization of large LMCA aneurysm.   PLAN: - Cont supportive care per PCCM - TCD tomorrow - Cont Nimotop 30 q2 Hrs   Lisbeth Renshaw, MD [facility]

## 2023-06-11 NOTE — Evaluation (Signed)
Speech Language Pathology Evaluation Patient Details Name: Yolanda [name] MRN: [id number] DOB: Aug 03, 1933 Today's Date: 06/11/2023 Time: 1145-1200 SLP Time Calculation (min) (ACUTE ONLY): 15 min  Problem List:  Patient Active Problem List   Diagnosis Date Noted   Subarachnoid hemorrhage (HCC) 06/10/2023   Dementia (HCC) 06/29/2022   Closed fracture of left proximal humerus 03/27/2021   Closed fracture of lower end of left radius with routine healing 10/31/2020   Dry cough 04/04/2019   Mild intermittent asthma without complication 04/04/2019   OSTEOARTHRITIS 08/04/2010   Past Medical History:  Past Medical History:  Diagnosis Date   Closed fracture of left proximal humerus 03/27/2021   Closed fracture of lower end of left radius with routine healing 10/31/2020   Dementia (HCC)    Mild intermittent asthma without complication 04/04/2019   OSTEOARTHRITIS 08/04/2010   Annotation: Bilateral knees and ankles Qualifier: Diagnosis of  By: Delrae Alfred MD, Lanora Manis     Past Surgical History:  Past Surgical History:  Procedure Laterality Date   IR 3D INDEPENDENT WKST  06/10/2023   IR ANGIOGRAM FOLLOW UP STUDY  06/10/2023   IR ANGIOGRAM FOLLOW UP STUDY  06/10/2023   IR NEURO EACH ADD'L AFTER BASIC UNI LEFT (MS)  06/10/2023   IR TRANSCATH/EMBOLIZ  06/10/2023   RADIOLOGY WITH ANESTHESIA N/A 06/10/2023   Procedure: IR WITH ANESTHESIA;  Surgeon: Lisbeth Renshaw, MD;  Location: [facility] OR;  Service: Radiology;  Laterality: N/A;   HPI:  89 year old female that initially presented with an aneurysmal subarachnoid hemorrhage after code stroke with severe headache and right facial droop that is now status post coiling.   Assessment / Plan / Recommendation Clinical Impression  Cognitive-linguistic evaluation complete but limited due to restlessness secondary to pain. Rn in room and providing pain medication by the end of the session. Patient with notable impairments in the areas of attention, awareness, short  term recall, and problem solving. Requiring bilateral mittens for safety. Requiring information to be repeated multiple times during conversation. Family does report baseline memory deficits due to "old age" but reports that current behavior (mostly moaning and decreased attention) are new since admission. Patient will benefit from ongoing SLP services to address new cognitive deficits.    SLP Assessment  SLP Recommendation/Assessment: Patient needs continued Speech Lanaguage Pathology Services SLP Visit Diagnosis: Dysphagia, unspecified (R13.10);Cognitive communication deficit (R41.841)    Recommendations for follow up therapy are one component of a multi-disciplinary discharge planning process, led by the attending physician.  Recommendations may be updated based on patient status, additional functional criteria and insurance authorization.    Follow Up Recommendations   (TBD)    Assistance Recommended at Discharge  Frequent or constant Supervision/Assistance  Functional Status Assessment Patient has had a recent decline in their functional status and demonstrates the ability to make significant improvements in function in a reasonable and predictable amount of time.  Frequency and Duration min 2x/week  2 weeks      SLP Evaluation Cognition  Overall Cognitive Status: Impaired/Different from baseline Arousal/Alertness: Awake/alert Orientation Level: Oriented to person;Oriented to place;Disoriented to situation;Disoriented to time Attention: Focused Focused Attention: Impaired Focused Attention Impairment: Verbal basic (per family, repetition of information was necessary at home prior to admission) Memory: Impaired Memory Impairment: Retrieval deficit Awareness: Impaired Awareness Impairment: Intellectual impairment Problem Solving: Impaired Problem Solving Impairment: Verbal basic;Functional basic Behaviors: Restless Safety/Judgment: Impaired (bilateral mittins in place)        Comprehension  Auditory Comprehension Overall Auditory Comprehension: Impaired Yes/No Questions: Not tested Commands: Impaired One  Step Basic Commands: 50-74% accurate Visual Recognition/Discrimination Discrimination: Not tested Reading Comprehension Reading Status: Not tested    Expression Expression Primary Mode of Expression: Verbal Verbal Expression Overall Verbal Expression: Appears within functional limits for tasks assessed (per family)   Oral / Motor  Oral Motor/Sensory Function Overall Oral Motor/Sensory Function: Within functional limits Motor Speech Overall Motor Speech: Appears within functional limits for tasks assessed           Yolanda Lango MA, CCC-SLP  Yolanda [name] Yolanda [name] 06/11/2023, 12:17 PM

## 2023-06-11 NOTE — Progress Notes (Signed)
eLink Physician-Brief Progress Note Patient Name: Yolanda [name] DOB: 04-11-1933 MRN: [id number]   Date of Service  06/11/2023  HPI/Events of Note  Patient with persistent respiratory difficulties, and now less responsive than earlier.  eICU Interventions  Will ask ground crew to evaluate patient for intubation for airway protection, ABG, post-intubation head CT given change in mental status.        Thomasene Lot Chelesea Weiand 06/11/2023, 11:05 PM

## 2023-06-11 NOTE — Evaluation (Signed)
Clinical/Bedside Swallow Evaluation Patient Details  Name: Ruqiya Auster MRN: [id number] Date of Birth: 1933/08/24  Today's Date: 06/11/2023 Time: SLP Start Time (ACUTE ONLY): 1130 SLP Stop Time (ACUTE ONLY): 1145 SLP Time Calculation (min) (ACUTE ONLY): 15 min  Past Medical History:  Past Medical History:  Diagnosis Date   Closed fracture of left proximal humerus 03/27/2021   Closed fracture of lower end of left radius with routine healing 10/31/2020   Dementia (HCC)    Mild intermittent asthma without complication 04/04/2019   OSTEOARTHRITIS 08/04/2010   Annotation: Bilateral knees and ankles Qualifier: Diagnosis of  By: Delrae Alfred MD, Lanora Manis     Past Surgical History:  Past Surgical History:  Procedure Laterality Date   IR 3D INDEPENDENT WKST  06/10/2023   IR ANGIOGRAM FOLLOW UP STUDY  06/10/2023   IR ANGIOGRAM FOLLOW UP STUDY  06/10/2023   IR NEURO EACH ADD'L AFTER BASIC UNI LEFT (MS)  06/10/2023   IR TRANSCATH/EMBOLIZ  06/10/2023   RADIOLOGY WITH ANESTHESIA N/A 06/10/2023   Procedure: IR WITH ANESTHESIA;  Surgeon: Lisbeth Renshaw, MD;  Location: [facility] OR;  Service: Radiology;  Laterality: N/A;   HPI:  89 year old female that initially presented with an aneurysmal subarachnoid hemorrhage after code stroke with severe headache and right facial droop that is now status post coiling.    Assessment / Plan / Recommendation  Clinical Impression  Patient presents with a functional appearing oropharyngeal dysphagia. Oral phase mildly prolonged with with mild diffuse oral residuals post swallow with solids only, likely a combination of missing upper dentition and significant distractability (pain). No overt indication of aspiration. Per family, no swallowing difficulty at baseline. Will resume a po diet and f/u for tolerance and potential to advance solids. SLP Visit Diagnosis: Dysphagia, unspecified (R13.10)    Aspiration Risk  Mild aspiration risk    Diet Recommendation Dysphagia 3 (Mech  soft);Thin liquid    Liquid Administration via: Cup;Straw Medication Administration: Whole meds with puree Supervision: Staff to assist with self feeding;Full supervision/cueing for compensatory strategies Compensations: Slow rate;Small sips/bites;Minimize environmental distractions Postural Changes: Seated upright at 90 degrees    Other  Recommendations Oral Care Recommendations: Oral care BID    Recommendations for follow up therapy are one component of a multi-disciplinary discharge planning process, led by the attending physician.  Recommendations may be updated based on patient status, additional functional criteria and insurance authorization.  Follow up Recommendations No SLP follow up         Functional Status Assessment Patient has had a recent decline in their functional status and demonstrates the ability to make significant improvements in function in a reasonable and predictable amount of time.  Frequency and Duration min 1 x/week  1 week       Prognosis Prognosis for improved oropharyngeal function: Good      Swallow Study   General HPI: 89 year old female that initially presented with an aneurysmal subarachnoid hemorrhage after code stroke with severe headache and right facial droop that is now status post coiling. Type of Study: Bedside Swallow Evaluation Previous Swallow Assessment: none Diet Prior to this Study: NPO (except meds) Temperature Spikes Noted: No Respiratory Status: Room air History of Recent Intubation: No Behavior/Cognition: Alert;Doesn't follow directions (distracted by pain, restless) Oral Cavity Assessment: Within Functional Limits Oral Care Completed by SLP: Recent completion by staff Oral Cavity - Dentition: Dentures, bottom Vision: Functional for self-feeding Self-Feeding Abilities: Able to feed self Patient Positioning: Upright in bed (although continuously moving, rolling to the side) Baseline Vocal Quality:  Normal Volitional Cough:  Cognitively unable to elicit Volitional Swallow: Unable to elicit    Oral/Motor/Sensory Function Overall Oral Motor/Sensory Function: Within functional limits   Ice Chips Ice chips: Within functional limits Presentation: Spoon   Thin Liquid Thin Liquid: Within functional limits Presentation: Cup;Straw    Nectar Thick Nectar Thick Liquid: Not tested   Honey Thick Honey Thick Liquid: Not tested   Puree Puree: Within functional limits Presentation: Spoon   Solid     Solid: Impaired Presentation: Self Fed Oral Phase Impairments: Impaired mastication Oral Phase Functional Implications: Prolonged oral transit (likely related to decreased attention)     Ferdinand Lango MA, CCC-SLP  Ashish Rossetti Meryl 06/11/2023,12:09 PM

## 2023-06-11 NOTE — Progress Notes (Signed)
eLink Physician-Brief Progress Note Patient Name: Yolanda [name] DOB: 1932/12/23 MRN: [id number]   Date of Service  06/11/2023  HPI/Events of Note  CXR suggestive of pulmonary edema, patient s/p CVA with IR intervention, per bedside RN patient is maintaining her saturation at 96 % and is on triple H therapy for possible vasospasm.  eICU Interventions  Will check BNP and CVP to try to establish volume status, bedside RN to clarify with neurosurgery if ongoing fluids are indicated for treatment of suspected vasospasm.        Migdalia Dk 06/11/2023, 8:56 PM

## 2023-06-11 NOTE — Progress Notes (Addendum)
LB PCCM PROGRESS NOTE  S: 89 year old female admitted for [facility] s/p MCA aneurysm rupture. Has been on pressors to maintain SBP goal 130-150, but requirement has been increasing over the course of the evening. She has also been struggling with agitation today and mental status has been slowly declining. Unable to get imaging due to agitation. Now she is in obvious respiratory distress and requiring 15L to maintain sats in the 90s. She is not protecting her airway and is pooling secretions in the posterior pharynx.   O: BP (!) 120/102   Pulse 66   Temp (!) 96.4 F (35.8 C) (Axillary)   Resp 13   Ht 4\' 8"  (1.422 m)   Wt 56.2 kg   SpO2 98%   BMI 27.78 kg/m   General:  elderly female  Neuro:  Responds to pain only.  HEENT:  [place]/AT, PERRL, no JVD Cardiovascular:  RRR, no MRG Lungs:  Pooling secretions in posterior pharynx. Prolonged expiratory phase, no wheeze.  Abdomen:  Soft, non-tender, non-distended Musculoskeletal:  No acute deformity Skin:  Intact, MMM   A/P:  Acute respiratory failure with hypoxia: Requiring 15L [place] to maintain sats in 90s. Not protecting airway. CXR looks like edema. I suspect she is aspirating as well.  - Intubate now - Lung protective ventilation strategy - VAP bundle - CXR and ABG - Will hold diuresis for now considering her pressor requirment - Start ceftriaxone for aspiration coverage - Fentanyl for RASS goal 0 to -1.  - PRN versed for breakthrough agitation  SAH secondary to ruptured MCA aneurysm.  - Waning mental status throughout the night.  - Vasospasm watch - CT head STAT - SBP goal 130-150   Critical care time independent of procedures: 36 minutes.   Joneen Roach, AGACNP-BC [facility]  See Amion for personal pager PCCM on call pager [phone] until 7pm. Please call Elink 7p-7a. [phone]  06/12/2023 12:02 AM

## 2023-06-11 NOTE — Progress Notes (Signed)
TCD study attempted, however patient agitated and unable to stay still even with additional sedation.  Dahlia Client, RN made aware.  We will re-attempt study tomorrow.   06/11/2023 4:24 PM Yolanda [name] RVT, RDMS

## 2023-06-11 NOTE — Progress Notes (Signed)
NAMEAdaria [name], MRN:  [id number], DOB:  December 26, 1932, LOS: 1 ADMISSION DATE:  06/10/2023, CONSULTATION DATE:  06/10/2023 REFERRING MD:  Ivan Croft, CHIEF COMPLAINT:  Subarachnoid Hemorrhage with multiple aneurysms/ HTN   History of Present Illness:  89 year old female with history of mild asthma and HTN presents 06/10/2023 to the ED with acute onset of severe headache. Pt. Was last known normal at 10 PM 8/18. She awoke at 6 am 06/10/2023 with severe HA. No history of trauma or fall. Patient with several  episodes of emesis requiring antiemetics. Initial NIH score on arrival to the ED was 22, since arriving to the ED patient is much more alert, following commands. She initially had right sided weakness , that is less evident on my exam. Right facial droop.  Initial Hunt Hess Score is 3. Continues to have severe headache and photophobia. Patient speaks [place]., her son translates.   CT scan demonstrates evidence of a diffuse Grade 3 subarachnoid hemorrhage more prominent in her left sylvian fissure. 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   Neurosurgery has been consulted to manage subarachnoid bleed and aneurysm management. PCCM have been asked to admit and manage ICU care.   Pertinent  Medical History   Past Medical History:  Diagnosis Date   Closed fracture of left proximal humerus 03/27/2021   Closed fracture of lower end of left radius with routine healing 10/31/2020   Dementia (HCC)    Mild intermittent asthma without complication 04/04/2019   OSTEOARTHRITIS 08/04/2010   Annotation: Bilateral knees and ankles Qualifier: Diagnosis of  By: Delrae Alfred MD, [facility] Events: Including procedures, antibiotic start and stop dates in addition to other pertinent events   06/10/2023 Admission   Interim History / Subjective:  Patient awake Some confusion but follows commands; weaker  on right than left BP soft after receiving nimotop on neo  Objective   Blood pressure 93/66, pulse (!) 53, temperature 98.1 F (36.7 C), temperature source Oral, resp. rate 17, height 4\' 8"  (1.422 m), weight 56.2 kg, SpO2 95%.        Intake/Output Summary (Last 24 hours) at 06/11/2023 0715 Last data filed at 06/11/2023 0600 Gross per 24 hour  Intake 3627.59 ml  Output 2135 ml  Net 1492.59 ml   Filed Weights   06/10/23 0600 06/10/23 0725 06/11/23 0000  Weight: 58 kg 58 kg 56.2 kg    Examination: General: elderly female in NAD HEENT: MM pink/moist; [place] in place Neuro: confused but able to state name; able to follow some commands; MAE rue slightly weaker than left CV: s1s2, RRR, no m/r/g PULM:  dim clear BS bilaterally; [place] 2L GI: soft, bsx4 active  Extremities: warm/dry, no edema  Skin: no rashes or lesions   Resolved Hospital Problem list     Assessment & Plan:  Grade 3 subarachnoid hemorrhage likely secondary to MCA aneurysm rupture.  High risk vasospasm  HTN Plan: -NSG following; appreciate recs -further imaging per nsg -frequent neuro checks -limit sedating meds -Keppra bid -will switch neo to levo for SBP goal 130-150 -nimotop dose frequency changed to q2 -Continue neuroprotective measures- normothermia, euglycemia, HOB greater than 30, head in neutral alignment, normocapnia, normoxia.  -PT/OT/SLP  Hypotension: was initially on cleviprex but since starting nimotop has become more hypotensive requiring neo Plan: -will change neo to levo for sbp goal as above  Nausea  Plan: -Zofran as needed  Hypokalemia Hypomagnesemia Plan: -replete k and mag -trend electrolytes    Best Practice (right click and "Reselect all SmartList Selections" daily)   Diet/type: NPO w/ oral meds; slp for swallow eval later today DVT prophylaxis: SCD GI prophylaxis: PPI Lines: PIV Foley:  Yes, and it is still needed Code Status:  full code Last date of multidisciplinary  goals of care discussion [8/20 updated family at bedside using translater]  Labs   CBC: Recent Labs  Lab 06/10/23 0635 06/10/23 0636 06/10/23 0800 06/11/23 0457  WBC 7.6  --  11.1* 13.2*  NEUTROABS 2.6  --   --   --   HGB 13.0 13.3 13.1 12.7  HCT 39.9 39.0 40.7 38.2  MCV 87.3  --  89.1 86.6  PLT 206  --  208 241    Basic Metabolic Panel: Recent Labs  Lab 06/10/23 0636 06/10/23 0800 06/11/23 0457  NA 137 135 139  K 4.8 4.0 3.3*  CL 104 103 109  CO2  --  20* 20*  GLUCOSE 189* 155* 173*  BUN 12 10 10   CREATININE 0.70 0.95 0.88  CALCIUM  --  8.6* 8.0*  MG  --  1.7 1.8  PHOS  --  3.2 2.8   GFR: Estimated Creatinine Clearance: 30.9 mL/min (by C-G formula based on SCr of 0.88 mg/dL). Recent Labs  Lab 06/10/23 0635 06/10/23 0800 06/11/23 0457  WBC 7.6 11.1* 13.2*    Liver Function Tests: Recent Labs  Lab 06/10/23 0800  AST 34  ALT 32  ALKPHOS 60  BILITOT 1.3*  PROT 7.2  ALBUMIN 3.7   No results for input(s): "LIPASE", "AMYLASE" in the last 168 hours. No results for input(s): "AMMONIA" in the last 168 hours.  ABG    Component Value Date/Time   TCO2 22 06/10/2023 0636     Coagulation Profile: Recent Labs  Lab 06/10/23 0635  INR 1.0    Cardiac Enzymes: Recent Labs  Lab 06/10/23 0800  CKTOTAL 32*  CKMB 1.9    HbA1C: No results found for: "HGBA1C"  CBG: Recent Labs  Lab 06/10/23 0632  GLUCAP 191*    Review of Systems:   Through her son as Nurse, learning disability, headache with light sensitivity, nausea.  Past Medical History:  She,  has a past medical history of Closed fracture of left proximal humerus (03/27/2021), Closed fracture of lower end of left radius with routine healing (10/31/2020), Dementia (HCC), Mild intermittent asthma without complication (04/04/2019), and OSTEOARTHRITIS (08/04/2010).   Surgical History:   Past Surgical History:  Procedure Laterality Date   IR 3D INDEPENDENT WKST  06/10/2023   IR ANGIOGRAM FOLLOW UP STUDY   06/10/2023   IR ANGIOGRAM FOLLOW UP STUDY  06/10/2023   IR NEURO EACH ADD'L AFTER BASIC UNI LEFT (MS)  06/10/2023   IR TRANSCATH/EMBOLIZ  06/10/2023   RADIOLOGY WITH ANESTHESIA N/A 06/10/2023   Procedure: IR WITH ANESTHESIA;  Surgeon: Lisbeth Renshaw, MD;  Location: [facility] OR;  Service: Radiology;  Laterality: N/A;     Social History:   reports that she has never smoked. She does not have any smokeless tobacco history on file. She reports that she does not drink alcohol and does not use drugs.   Family History:  Her family history is not on file.   Allergies No Known Allergies   Home Medications  Prior to Admission medications   Medication Sig Start Date End Date Taking? Authorizing Provider  Cholecalciferol (VITAMIN D3) 1.25 MG (50000 UT) CAPS Take 1 capsule by mouth once  a week. 11/08/21   [provider]  diclofenac sodium (VOLTAREN) 1 % GEL Apply 1 application topically 4 (four) times daily. 4gm per dose 11/23/11   Linna Hoff, MD  Guaifenesin-Codeine 400-10 MG TABS Take 1 tablet by mouth 4 (four) times daily as needed. 04/06/12   Moreno-Coll, Adlih, MD  HYDROcodone-acetaminophen (NORCO) 5-325 MG tablet Take 1 tablet by mouth every 6 (six) hours as needed. 03/21/21   Dartha Lodge, PA-C  pantoprazole (PROTONIX) 20 MG tablet Take 1 tablet (20 mg total) by mouth daily for 14 days. 04/04/22 05/02/22  Sloan Leiter, DO  sucralfate (CARAFATE) 1 g tablet Take 1 tablet (1 g total) by mouth 4 (four) times daily -  with meals and at bedtime for 7 days. 04/04/22 05/02/22  Sloan Leiter, DO     Critical care time: 35 minutes    JD Anselm Lis [facility] 06/11/2023, 8:06 AM  Please see [email or web address] for pager details.  From 7A-7P if no response, please call [phone]. After hours, please call ELink [phone].

## 2023-06-11 NOTE — Progress Notes (Signed)
Patient started on vasopressor overnight for blood pressure dropping into the 80s systolic.   Approximately 20 minutes after 0600 Nimotop administration, patient's blood pressure dropped again into the 80s systolic.   This RN noted that patient's blood pressure also dropped during initial Nimotop administration in the ED on 8/19 at about 1100.   CCM Elink RN notified about trend with Nimotop administration and blood pressure drop.  Vasopressor still infusing and titrated as needed.

## 2023-06-11 NOTE — Progress Notes (Addendum)
0730 SBP remains below goal despite neo gtt infusing and titrated; paged CCM MD and Nundkumar MD, orders received for NS bolus and change nimotop to 30mg  q2h.  0800 rounds levo gtt added with goal of titrating off neo gtt, *attempt to keep SBP 130-150 though >100 okay as long as no neuro deficits.  1100 loss of PIV x2 including ultrasound guided IV. PICC order placed per CCM. 1300 pt restless throughout morning/shift thus far, CCM PA to bedside; scheduled seroquel added, qtc 477. Family remains at bedside during shift, education discussions regarding delirium, SAH, safety, medications, BP.  1500 precedex gtt order obtained.  1600 2mg  versed x1 given to attempt TCD study 1830 pt quite drowsy though still restless, requiring constant stimulation to answer questions and to take nimotop. RUE noted to be weaker than before.CCM and neurosx PA made aware.

## 2023-06-11 NOTE — Procedures (Signed)
Central Venous Catheter Insertion Procedure Note  Yolanda [name]  [id number]  1933-06-11  Date:06/11/23  Time:7:46 PM   Provider Performing:Sharrod Achille Lacretia Nicks Mikey Bussing   Procedure: Insertion of Non-tunneled Central Venous [phone]) with US guidance ([id number])   Indication(s) Medication administration  Consent Risks of the procedure as well as the alternatives and risks of each were explained to the patient and/or caregiver.  Consent for the procedure was obtained and is signed in the bedside chart  Anesthesia Topical only with 1% lidocaine   Timeout Verified patient identification, verified procedure, site/side was marked, verified correct patient position, special equipment/implants available, medications/allergies/relevant history reviewed, required imaging and test results available.  Sterile Technique Maximal sterile technique including full sterile barrier drape, hand hygiene, sterile gown, sterile gloves, mask, hair covering, sterile ultrasound probe cover (if used).  Procedure Description Area of catheter insertion was cleaned with chlorhexidine and draped in sterile fashion.  With real-time ultrasound guidance a central venous catheter was placed into the left internal jugular vein. Nonpulsatile blood flow and easy flushing noted in all ports.  The catheter was sutured in place and sterile dressing applied.  Complications/Tolerance None; patient tolerated the procedure well. Chest X-ray is ordered to verify placement for internal jugular or subclavian cannulation.   Chest x-ray is not ordered for femoral cannulation.  EBL Minimal  Specimen(s) None    Yolanda [name], AGACNP-BC [facility]  See Amion for personal pager PCCM on call pager [phone] until 7pm. Please call Elink 7p-7a. [phone]  06/11/2023 7:46 PM

## 2023-06-12 ENCOUNTER — Inpatient Hospital Stay (HOSPITAL_COMMUNITY): Payer: Medicare Other

## 2023-06-12 ENCOUNTER — Other Ambulatory Visit (HOSPITAL_COMMUNITY): Payer: Medicare Other

## 2023-06-12 DIAGNOSIS — I509 Heart failure, unspecified: Secondary | ICD-10-CM

## 2023-06-12 DIAGNOSIS — I609 Nontraumatic subarachnoid hemorrhage, unspecified: Secondary | ICD-10-CM | POA: Diagnosis not present

## 2023-06-12 DIAGNOSIS — J9600 Acute respiratory failure, unspecified whether with hypoxia or hypercapnia: Secondary | ICD-10-CM | POA: Diagnosis not present

## 2023-06-12 LAB — BASIC METABOLIC PANEL
Anion gap: 9 (ref 5–15)
Anion gap: 15 (ref 5–15)
BUN: 10 mg/dL (ref 8–23)
BUN: 11 mg/dL (ref 8–23)
CO2: 18 mmol/L — ABNORMAL LOW (ref 22–32)
CO2: 21 mmol/L — ABNORMAL LOW (ref 22–32)
Calcium: 7.1 mg/dL — ABNORMAL LOW (ref 8.9–10.3)
Calcium: 7.9 mg/dL — ABNORMAL LOW (ref 8.9–10.3)
Chloride: 109 mmol/L (ref 98–111)
Chloride: 103 mmol/L (ref 98–111)
Creatinine, Ser: 0.85 mg/dL (ref 0.44–1.00)
Creatinine, Ser: 0.81 mg/dL (ref 0.44–1.00)
GFR, Estimated: >60 mL/min (ref >60)
GFR, Estimated: >60 mL/min (ref >60)
Glucose, Bld: 241 mg/dL — ABNORMAL HIGH (ref 70–99)
Glucose, Bld: 119 mg/dL — ABNORMAL HIGH (ref 70–99)
Potassium: 3.6 mmol/L (ref 3.5–5.1)
Potassium: 3.4 mmol/L — ABNORMAL LOW (ref 3.5–5.1)
Sodium: 136 mmol/L (ref 135–145)
Sodium: 139 mmol/L (ref 135–145)

## 2023-06-12 LAB — ECHOCARDIOGRAM COMPLETE
Area-P 1/2: 2.63 cm2
Height: 56 in
MV M vel: 1.34 m/s
MV Peak grad: 7.1 mmHg
S' Lateral: 2.3 cm
Weight: 1982.38 oz

## 2023-06-12 LAB — POCT I-STAT 7, (LYTES, BLD GAS, ICA,H+H)
Acid-base deficit: 11 mmol/L — ABNORMAL HIGH (ref 0.0–2.0)
Acid-base deficit: 8 mmol/L — ABNORMAL HIGH (ref 0.0–2.0)
Acid-base deficit: 9 mmol/L — ABNORMAL HIGH (ref 0.0–2.0)
Bicarbonate: 18.6 mmol/L — ABNORMAL LOW (ref 20.0–28.0)
Bicarbonate: 20 mmol/L (ref 20.0–28.0)
Bicarbonate: 20.3 mmol/L (ref 20.0–28.0)
Calcium, Ion: 1.05 mmol/L — ABNORMAL LOW (ref 1.15–1.40)
Calcium, Ion: 1.07 mmol/L — ABNORMAL LOW (ref 1.15–1.40)
Calcium, Ion: 1.09 mmol/L — ABNORMAL LOW (ref 1.15–1.40)
HCT: 35 % — ABNORMAL LOW (ref 36.0–46.0)
HCT: 36 % (ref 36.0–46.0)
HCT: 37 % (ref 36.0–46.0)
Hemoglobin: 11.9 g/dL — ABNORMAL LOW (ref 12.0–15.0)
Hemoglobin: 12.2 g/dL (ref 12.0–15.0)
Hemoglobin: 12.6 g/dL (ref 12.0–15.0)
O2 Saturation: 92 %
O2 Saturation: 93 %
O2 Saturation: 95 %
Patient temperature: 97
Patient temperature: 97
Patient temperature: 98.2
Potassium: 3.7 mmol/L (ref 3.5–5.1)
Potassium: 3.7 mmol/L (ref 3.5–5.1)
Potassium: 3.8 mmol/L (ref 3.5–5.1)
Sodium: 140 mmol/L (ref 135–145)
Sodium: 141 mmol/L (ref 135–145)
Sodium: 141 mmol/L (ref 135–145)
TCO2: 20 mmol/L — ABNORMAL LOW (ref 22–32)
TCO2: 22 mmol/L (ref 22–32)
TCO2: 22 mmol/L (ref 22–32)
pCO2 arterial: 40.2 mmHg (ref 32–48)
pCO2 arterial: 53.8 mmHg — ABNORMAL HIGH (ref 32–48)
pCO2 arterial: 66 mmHg (ref 32–48)
pH, Arterial: 7.09 — CL (ref 7.35–7.45)
pH, Arterial: 7.173 — CL (ref 7.35–7.45)
pH, Arterial: 7.272 — ABNORMAL LOW (ref 7.35–7.45)
pO2, Arterial: 81 mmHg — ABNORMAL LOW (ref 83–108)
pO2, Arterial: 84 mmHg (ref 83–108)
pO2, Arterial: 88 mmHg (ref 83–108)

## 2023-06-12 LAB — HEPATIC FUNCTION PANEL
ALT: 56 U/L — ABNORMAL HIGH (ref 0–44)
AST: 31 U/L (ref 15–41)
Albumin: 3.2 g/dL — ABNORMAL LOW (ref 3.5–5.0)
Alkaline Phosphatase: 52 U/L (ref 38–126)
Bilirubin, Direct: 0.2 mg/dL (ref 0.0–0.2)
Indirect Bilirubin: 1.5 mg/dL — ABNORMAL HIGH (ref 0.3–0.9)
Total Bilirubin: 1.7 mg/dL — ABNORMAL HIGH (ref 0.3–1.2)
Total Protein: 6.6 g/dL (ref 6.5–8.1)

## 2023-06-12 LAB — MAGNESIUM: Magnesium: 2.1 mg/dL (ref 1.7–2.4)

## 2023-06-12 LAB — AMMONIA: Ammonia: 23 umol/L (ref 9–35)

## 2023-06-12 LAB — GLUCOSE, CAPILLARY
Glucose-Capillary: 119 mg/dL — ABNORMAL HIGH (ref 70–99)
Glucose-Capillary: 121 mg/dL — ABNORMAL HIGH (ref 70–99)
Glucose-Capillary: 134 mg/dL — ABNORMAL HIGH (ref 70–99)
Glucose-Capillary: 160 mg/dL — ABNORMAL HIGH (ref 70–99)
Glucose-Capillary: 176 mg/dL — ABNORMAL HIGH (ref 70–99)
Glucose-Capillary: 177 mg/dL — ABNORMAL HIGH (ref 70–99)
Glucose-Capillary: 219 mg/dL — ABNORMAL HIGH (ref 70–99)

## 2023-06-12 LAB — CBC
HCT: 34.6 % — ABNORMAL LOW (ref 36.0–46.0)
Hemoglobin: 11.2 g/dL — ABNORMAL LOW (ref 12.0–15.0)
MCH: 29.3 pg (ref 26.0–34.0)
MCHC: 32.4 g/dL (ref 30.0–36.0)
MCV: 90.6 fL (ref 80.0–100.0)
Platelets: 231 10*3/uL (ref 150–400)
RBC: 3.82 MIL/uL — ABNORMAL LOW (ref 3.87–5.11)
RDW: 14.3 % (ref 11.5–15.5)
WBC: 20 10*3/uL — ABNORMAL HIGH (ref 4.0–10.5)
nRBC: 0 % (ref 0.0–0.2)

## 2023-06-12 LAB — PROCALCITONIN: Procalcitonin: 0.38 ng/mL

## 2023-06-12 LAB — HEMOGLOBIN A1C
Hgb A1c MFr Bld: 5.8 % — ABNORMAL HIGH (ref 4.8–5.6)
Mean Plasma Glucose: 119.76 mg/dL

## 2023-06-12 LAB — PHOSPHORUS: Phosphorus: 2.2 mg/dL — ABNORMAL LOW (ref 2.5–4.6)

## 2023-06-12 MED ORDER — SODIUM CHLORIDE 0.9 % IV SOLN
3.0000 g | Freq: Four times a day (QID) | INTRAVENOUS | Status: DC
  Administered 2023-06-12 – 2023-06-13 (×4): 3 g via INTRAVENOUS
  Filled 2023-06-12 (×4): qty 8

## 2023-06-12 MED ORDER — FUROSEMIDE 10 MG/ML IJ SOLN
40.0000 mg | Freq: Once | INTRAMUSCULAR | Status: AC
  Administered 2023-06-12: 40 mg via INTRAVENOUS
  Filled 2023-06-12: qty 4

## 2023-06-12 MED ORDER — POTASSIUM CHLORIDE 20 MEQ PO PACK: 40.0000 meq | PACK | Freq: Once | ORAL | Status: DC

## 2023-06-12 MED ORDER — ORAL CARE MOUTH RINSE: 15.0000 mL | OROMUCOSAL | Status: DC | PRN

## 2023-06-12 MED ORDER — POTASSIUM CHLORIDE 20 MEQ PO PACK
60.0000 meq | PACK | Freq: Once | ORAL | Status: AC
  Administered 2023-06-12: 60 meq
  Filled 2023-06-12: qty 3

## 2023-06-12 MED ORDER — POTASSIUM PHOSPHATES 15 MMOLE/5ML IV SOLN
15.0000 mmol | Freq: Once | INTRAVENOUS | Status: AC
  Administered 2023-06-12: 15 mmol via INTRAVENOUS
  Filled 2023-06-12: qty 5

## 2023-06-12 MED ORDER — OSMOLITE 1.2 CAL PO LIQD
1000.0000 mL | ORAL | Status: DC
  Administered 2023-06-12 – 2023-06-20 (×8): 1000 mL
  Filled 2023-06-12: qty 1000

## 2023-06-12 MED ORDER — CALCIUM GLUCONATE-NACL 1-0.675 GM/50ML-% IV SOLN
1.0000 g | Freq: Once | INTRAVENOUS | Status: AC
  Administered 2023-06-12: 1000 mg via INTRAVENOUS
  Filled 2023-06-12: qty 50

## 2023-06-12 MED ORDER — ORAL CARE MOUTH RINSE
15.0000 mL | OROMUCOSAL | Status: DC
  Administered 2023-06-12 – 2023-06-14 (×28): 15 mL via OROMUCOSAL

## 2023-06-12 MED ORDER — PROSOURCE TF20 ENFIT COMPATIBL EN LIQD
60.0000 mL | Freq: Every day | ENTERAL | Status: DC
  Administered 2023-06-12 – 2023-06-25 (×14): 60 mL
  Filled 2023-06-12 (×14): qty 60

## 2023-06-12 MED ORDER — INSULIN ASPART 100 UNIT/ML IJ SOLN
0.0000 [IU] | INTRAMUSCULAR | Status: DC
  Administered 2023-06-12: 2 [IU] via SUBCUTANEOUS
  Administered 2023-06-13 (×5): 1 [IU] via SUBCUTANEOUS
  Administered 2023-06-14: 2 [IU] via SUBCUTANEOUS
  Administered 2023-06-14 – 2023-06-18 (×15): 1 [IU] via SUBCUTANEOUS
  Administered 2023-06-18: 2 [IU] via SUBCUTANEOUS
  Administered 2023-06-18 – 2023-06-20 (×7): 1 [IU] via SUBCUTANEOUS
  Administered 2023-06-20: 2 [IU] via SUBCUTANEOUS
  Administered 2023-06-20 – 2023-06-23 (×6): 1 [IU] via SUBCUTANEOUS
  Administered 2023-06-24: 2 [IU] via SUBCUTANEOUS
  Administered 2023-06-25 – 2023-06-26 (×2): 1 [IU] via SUBCUTANEOUS

## 2023-06-12 NOTE — Progress Notes (Signed)
Pt transported on ventilator from 4N31 to CT and back with no complications. RN x2 @ RT at bedside.

## 2023-06-12 NOTE — Progress Notes (Signed)
OT Cancellation Note  Patient Details Name: Yolanda [name] MRN: [id number] DOB: May 19, 1933   Cancelled Treatment:    Reason Eval/Treat Not Completed: Patient not medically ready;Fatigue/lethargy limiting ability to participate Patient currently only responding to pain, RN requesting OT hold off until patient is more alert to participate. OT will follow back to complete evaluation when appropriate.   Pollyann Glen E. Fraya Ueda, OTR/L Acute Rehabilitation Services [phone]   Cherlyn Cushing 06/12/2023, 2:47 PM

## 2023-06-12 NOTE — Progress Notes (Signed)
Pharmacy Antibiotic Note  Yolanda [name] is a 89 y.o. female admitted on 06/10/2023 with pneumonia.  Pharmacy has been consulted for Unasyn dosing.  Plan: Unasyn 3g Q6H.  Follow culture data for de-escalation.  Monitor renal function for dose adjustments as indicated.   Height: 4\' 8"  (142.2 cm) Weight: 56.2 kg (123 lb 14.4 oz) IBW/kg (Calculated) : 36.3  Temp (24hrs), Avg:97.5 F (36.4 C), Min:96.4 F (35.8 C), Max:99.2 F (37.3 C)  Recent Labs  Lab 06/10/23 0635 06/10/23 0636 06/10/23 0800 06/11/23 0457 06/11/23 1927 06/12/23 0813  WBC 7.6  --  11.1* 13.2*  --  20.0*  CREATININE  --  0.70 0.95 0.88 0.94 0.85    Estimated Creatinine Clearance: 32 mL/min (by C-G formula based on SCr of 0.85 mg/dL).    No Known Allergies  Thank you for allowing pharmacy to be a part of this patient's care.  Estill Batten, PharmD, BCCCP  06/12/2023 10:37 AM

## 2023-06-12 NOTE — Progress Notes (Signed)
SLP Cancellation Note  Patient Details Name: Yolanda [name] MRN: [id number] DOB: 1933/04/04   Cancelled treatment:       Reason Eval/Treat Not Completed: Medical issues which prohibited therapy. Pt now intubated and sedated. Please reconsult when pt medically able to participate in SLP services.  Clyde Canterbury, M.S., CCC-SLP Speech-Language Pathologist Secure Chat Preferred  O: [phone]  Woodroe Chen 06/12/2023, 8:07 AM

## 2023-06-12 NOTE — Progress Notes (Addendum)
PT Cancellation Note  Patient Details Name: Yolanda [name] MRN: [id number] DOB: [birth year]   Cancelled Treatment:    Reason Eval/Treat Not Completed: (P) Patient not medically ready. Pt intubated last night and currently sedated. Will plan to follow-up later as able.   Addendum 14:28 - RN requesting PT continue to hold off today as pt is not following commands and only responding to pain. Will plan to follow-up tomorrow as able.   Raymond Gurney, PT, DPT Acute Rehabilitation Services  Office: [phone]    Yolanda [name] 06/12/2023, 8:07 AM

## 2023-06-12 NOTE — Progress Notes (Signed)
Patient minimally responds to pain and appears sedated but continues to set off the vent, per Dr. Warrick Parisian increase sedation.

## 2023-06-12 NOTE — Progress Notes (Signed)
eLink Physician-Brief Progress Note Patient Name: Yolanda [name] DOB: 1933-04-18 MRN: [id number]   Date of Service  06/12/2023  HPI/Events of Note  CT head result reviewed. BNP 672,  Mg++ 2.5, BMP reviewed.  eICU Interventions          Migdalia Dk 06/12/2023, 2:34 AM

## 2023-06-12 NOTE — Progress Notes (Addendum)
NAMEHala [name], MRN:  [id number], DOB:  06-12-1933, LOS: 2 ADMISSION DATE:  06/10/2023, CONSULTATION DATE:  06/10/2023 REFERRING MD:  Ivan Croft, CHIEF COMPLAINT:  Subarachnoid Hemorrhage with multiple aneurysms/ HTN   History of Present Illness:  89 year old female with history of mild asthma and HTN presents 06/10/2023 to the ED with acute onset of severe headache. Pt. Was last known normal at 10 PM 8/18. She awoke at 6 am 06/10/2023 with severe HA. No history of trauma or fall. Patient with several  episodes of emesis requiring antiemetics. Initial NIH score on arrival to the ED was 22, since arriving to the ED patient is much more alert, following commands. She initially had right sided weakness , that is less evident on my exam. Right facial droop.  Initial Hunt Hess Score is 3. Continues to have severe headache and photophobia. Patient speaks [place]., her son translates.   CT scan demonstrates evidence of a diffuse Grade 3 subarachnoid hemorrhage more prominent in her left sylvian fissure. 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   Neurosurgery has been consulted to manage subarachnoid bleed and aneurysm management. PCCM have been asked to admit and manage ICU care.   Pertinent  Medical History   Past Medical History:  Diagnosis Date   Closed fracture of left proximal humerus 03/27/2021   Closed fracture of lower end of left radius with routine healing 10/31/2020   Dementia (HCC)    Mild intermittent asthma without complication 04/04/2019   OSTEOARTHRITIS 08/04/2010   Annotation: Bilateral knees and ankles Qualifier: Diagnosis of  By: Delrae Alfred MD, [facility] Events: Including procedures, antibiotic start and stop dates in addition to other pertinent events   06/10/2023 Admission [facility]  Interim History / Subjective:  Yesterday patient w/ increased confusion and delirium  requiring precedex and seroquel. Overnight worsening mentation requiring intubation for airway protection CT head appears similar to previous CT head showing similar distribution SAH Glucose range 176-219 35 mcg levo and 0.03 vaso; SBP 138  Objective   Blood pressure (!) 112/57, pulse 80, temperature 98.2 F (36.8 C), temperature source Axillary, resp. rate 10, height 4\' 8"  (1.422 m), weight 56.2 kg, SpO2 97%. CVP:  [12 mmHg-38 mmHg] 29 mmHg  Vent Mode: PSV;CPAP FiO2 (%):  [60 %] 60 % Set Rate:  [18 bmp-24 bmp] 24 bmp Vt Set:  [320 mL] 320 mL PEEP:  [5 cmH20] 5 cmH20 Pressure Support:  [20 cmH20] 20 cmH20 Plateau Pressure:  [14 cmH20] 14 cmH20   Intake/Output Summary (Last 24 hours) at 06/12/2023 0705 Last data filed at 06/12/2023 0600 Gross per 24 hour  Intake 5707.54 ml  Output 1525 ml  Net 4182.54 ml   Filed Weights   06/10/23 0600 06/10/23 0725 06/11/23 0000  Weight: 58 kg 58 kg 56.2 kg    Examination: General:  critically ill elderly appearing female on mech vent HEENT: MM pink/moist; ETT in place Neuro: on 25 mcg fent opens eyes to voice; not following commands; responds to painful stimuli in all extremities but much weaker on RUE; PERRL CV: s1s2, RRR, no m/r/g PULM:  dim clear BS bilaterally; on mech vent PSV GI: soft, bsx4 active  Extremities: warm/dry, no edema  Skin: no rashes or lesions   Resolved Hospital Problem list     Assessment & Plan:  Grade 3 subarachnoid hemorrhage likely secondary to MCA aneurysm rupture.  Acute encephalopathy: delirium;  hx of dementia per family High risk vasospasm  HTN Plan: -NSG following; appreciate recs -CT head overnight w/ no significant change from prior CT; further imaging per nsg -frequent neuro checks -limit sedating meds -Keppra bid -SBP  goal per NSG; cont pressors -cont nimotop -Continue neuroprotective measures- normothermia, euglycemia, HOB greater than 30, head in neutral alignment, normocapnia, normoxia.   -PT/OT/SLP  Acute respiratory failure: intubated for poor gcs Pulmonary edema: elevated BNP Plan: -PSV trials during day as tolerated; hold on extubation until mental status improved -Wean PEEP/FiO2 for SpO2 >92% -VAP bundle in place -Daily SAT and SBT -PAD protocol in place -wean sedation for RASS goal 0 to -1 -Follow intermittent CXR and ABG PRN -check echo -lasix x1 today; monitor UOP  Hypotension: since starting nimotop has become more hypotensive requiring increasing pressors Plan: -will reach out to NSG for SBP goal range -cont levo and vaso -will start on unasyn for possible aspiration and check trach aspirate -check pct -check echo  Hyperglycemia Plan: -check A1c -ssi and cbg monitoring  Nausea  Plan: -hold on zofran given qtc prolongation overnight  Hypokalemia Hypomagnesemia Hypophosphatemia Plan: -replet k phos -trend bmp, phosph and mag    Best Practice (right click and "Reselect all SmartList Selections" daily)   Diet/type: NPO w/ oral meds; slp for swallow eval later today DVT prophylaxis: SCD GI prophylaxis: PPI Lines: CVL Foley:  Yes, and it is still needed Code Status:  full code Last date of multidisciplinary goals of care discussion [8/20 updated son at bedside ]  Labs   CBC: Recent Labs  Lab 06/10/23 0635 06/10/23 0636 06/10/23 0800 06/11/23 0457 06/12/23 0049 06/12/23 0242 06/12/23 0530  WBC 7.6  --  11.1* 13.2*  --   --   --   NEUTROABS 2.6  --   --   --   --   --   --   HGB 13.0   < > 13.1 12.7 12.6 12.2 11.9*  HCT 39.9   < > 40.7 38.2 37.0 36.0 35.0*  MCV 87.3  --  89.1 86.6  --   --   --   PLT 206  --  208 241  --   --   --    < > = values in this interval not displayed.    Basic Metabolic Panel: Recent Labs  Lab 06/10/23 0636 06/10/23 0800 06/11/23 0457 06/11/23 1927 06/12/23 0049 06/12/23 0242 06/12/23 0530  NA 137 135 139 139 141 141 140  K 4.8 4.0 3.3* 3.5 3.7 3.8 3.7  CL 104 103 109 114*  --   --   --    CO2  --  20* 20* 17*  --   --   --   GLUCOSE 189* 155* 173* 180*  --   --   --   BUN 12 10 10 12   --   --   --   CREATININE 0.70 0.95 0.88 0.94  --   --   --   CALCIUM  --  8.6* 8.0* 7.2*  --   --   --   MG  --  1.7 1.8 2.5*  --   --   --   PHOS  --  3.2 2.8  --   --   --   --    GFR: Estimated Creatinine Clearance: 28.9 mL/min (by C-G formula based on SCr of 0.94 mg/dL). Recent Labs  Lab 06/10/23 0635 06/10/23 0800 06/11/23 0457  WBC 7.6 11.1* 13.2*  Liver Function Tests: Recent Labs  Lab 06/10/23 0800  AST 34  ALT 32  ALKPHOS 60  BILITOT 1.3*  PROT 7.2  ALBUMIN 3.7   No results for input(s): "LIPASE", "AMYLASE" in the last 168 hours. No results for input(s): "AMMONIA" in the last 168 hours.  ABG    Component Value Date/Time   PHART 7.272 (L) 06/12/2023 0530   PCO2ART 40.2 06/12/2023 0530   PO2ART 88 06/12/2023 0530   HCO3 18.6 (L) 06/12/2023 0530   TCO2 20 (L) 06/12/2023 0530   ACIDBASEDEF 8.0 (H) 06/12/2023 0530   O2SAT 95 06/12/2023 0530     Coagulation Profile: Recent Labs  Lab 06/10/23 0635  INR 1.0    Cardiac Enzymes: Recent Labs  Lab 06/10/23 0800  CKTOTAL 32*  CKMB 1.9    HbA1C: No results found for: "HGBA1C"  CBG: Recent Labs  Lab 06/11/23 0839 06/11/23 1115 06/11/23 2005 06/12/23 0008 06/12/23 0359  GLUCAP 171* 215* 196* 176* 160*    Review of Systems:   Through her son as Nurse, learning disability, headache with light sensitivity, nausea.  Past Medical History:  She,  has a past medical history of Closed fracture of left proximal humerus (03/27/2021), Closed fracture of lower end of left radius with routine healing (10/31/2020), Dementia (HCC), Mild intermittent asthma without complication (04/04/2019), and OSTEOARTHRITIS (08/04/2010).   Surgical History:   Past Surgical History:  Procedure Laterality Date   IR 3D INDEPENDENT WKST  06/10/2023   IR ANGIO INTRA EXTRACRAN SEL INTERNAL CAROTID UNI L MOD SED  06/11/2023   IR ANGIOGRAM FOLLOW  UP STUDY  06/10/2023   IR ANGIOGRAM FOLLOW UP STUDY  06/10/2023   IR NEURO EACH ADD'L AFTER BASIC UNI LEFT (MS)  06/10/2023   IR TRANSCATH/EMBOLIZ  06/10/2023   RADIOLOGY WITH ANESTHESIA N/A 06/10/2023   Procedure: IR WITH ANESTHESIA;  Surgeon: Lisbeth Renshaw, MD;  Location: [facility] OR;  Service: Radiology;  Laterality: N/A;     Social History:   reports that she has never smoked. She does not have any smokeless tobacco history on file. She reports that she does not drink alcohol and does not use drugs.   Family History:  Her family history is not on file.   Allergies No Known Allergies   Home Medications  Prior to Admission medications   Medication Sig Start Date End Date Taking? Authorizing Provider  Cholecalciferol (VITAMIN D3) 1.25 MG (50000 UT) CAPS Take 1 capsule by mouth once a week. 11/08/21   [provider]  diclofenac sodium (VOLTAREN) 1 % GEL Apply 1 application topically 4 (four) times daily. 4gm per dose 11/23/11   Linna Hoff, MD  Guaifenesin-Codeine 400-10 MG TABS Take 1 tablet by mouth 4 (four) times daily as needed. 04/06/12   Moreno-Coll, Adlih, MD  HYDROcodone-acetaminophen (NORCO) 5-325 MG tablet Take 1 tablet by mouth every 6 (six) hours as needed. 03/21/21   Dartha Lodge, PA-C  pantoprazole (PROTONIX) 20 MG tablet Take 1 tablet (20 mg total) by mouth daily for 14 days. 04/04/22 05/02/22  Sloan Leiter, DO  sucralfate (CARAFATE) 1 g tablet Take 1 tablet (1 g total) by mouth 4 (four) times daily -  with meals and at bedtime for 7 days. 04/04/22 05/02/22  Sloan Leiter, DO     Critical care time: 40 minutes    JD Anselm Lis [place] [facility] 06/12/2023, 7:05 AM  Please see [email or web address] for pager details.  From 7A-7P if no response, please call [phone]. After  hours, please call ELink [phone].

## 2023-06-12 NOTE — Progress Notes (Signed)
  NEUROSURGERY PROGRESS NOTE   Patient with increasing oxygen requirement ultimately requiring intubation overnight.  Patient now off vasopressor support with normotension.  EXAM:  BP (!) 114/58   Pulse 82   Temp 98.8 F (37.1 C) (Axillary)   Resp 18   Ht 4\' 8"  (1.422 m)   Wt 56.2 kg   SpO2 96%   BMI 27.78 kg/m   Opens eyes to voice Localizes LUE/LLE Minimal movement RUE/RLE to stimulation Right groin site with ecchymosis, no hematoma  IMAGING: CT head this a.m. Personally reviewed and demonstrates significant beam hardening artifact from left MCA coiling.  No increase in subarachnoid hemorrhage to suggest rehemorrhage.  No hydrocephalus.  IMPRESSION:  89 y.o. female SAH d#2 s/p subtotal coil embolization of large left MCA aneurysm, worsening pulmonary status overnight now with possible right hemiparesis. While there is concern for possible aneurysm thrombosis and associated occlusion or partial occlusion of the M2 segments originating from the base of the aneurysm, I do not see any reasonable intervention that can treat this (ie thrombolytics, mechanical thrombectomy, or endarterectomy/bypass) as the risk profile is not tolerable. Overall, I suspect that the prognosis for good recovery for an 89yo patient presenting as grade 3 SAH is quite poor.  PLAN: - Would cont supportive care per PCCM - Maintain normotension. Pt not yet in spas window, and at 89yo, suspect her risk of spasm is on the lower side   Lisbeth Renshaw, MD [facility]

## 2023-06-12 NOTE — Progress Notes (Signed)
eLink Physician-Brief Progress Note Patient Name: Yolanda [name] DOB: 17-Nov-1932 MRN: [id number]   Date of Service  06/12/2023  HPI/Events of Note  ABG and X-Rays reviewed,   eICU Interventions  Respiratory rate increased to 24, repeat ABG in one hour, CXR and KUB show OG and ETT in good position. CT head to follow up stroke .        Knut Rondinelli U Merrisa Skorupski 06/12/2023, 1:14 AM

## 2023-06-12 NOTE — Progress Notes (Addendum)
Transcranial Doppler  Date POD PCO2 HCT BP  MCA ACA PCA OPHT SIPH VERT Basilar  8/21 SW     Right  Left   57  71   47 37     37 21      56  20   24  13    *  *   *           Right  Left                                            Right  Left                                             Right  Left                                             Right  Left                                            Right  Left                                            Right  Left                                        MCA = Middle Cerebral Artery      OPHT = Opthalmic Artery     BASILAR = Basilar Artery   ACA = Anterior Cerebral Artery     SIPH = Carotid Siphon PCA = Posterior Cerebral Artery   VERT = Verterbral Artery                   Normal MCA = 62+\-12 ACA = 50+\-12 PCA = 42+\-23

## 2023-06-12 NOTE — Procedures (Signed)
Cortrak  Tube Type:  Cortrak - 43 inches Tube Location:  Left nare Initial Placement:  Stomach Secured by: Bridle Technique Used to Measure Tube Placement:  Marking at nare/corner of mouth Cortrak Secured At:  60 cm   Cortrak Tube Team Note:  Consult received to place a Cortrak feeding tube.   X-ray is required, abdominal x-ray has been ordered by the Cortrak team. Please confirm tube placement before using the Cortrak tube.   If the tube becomes dislodged please keep the tube and contact the Cortrak team at [email or web address] for replacement.  If after hours and replacement cannot be delayed, place a NG tube and confirm placement with an abdominal x-ray.   Betsey Holiday MS, RD, LDN Please refer to [facility] for RD and/or RD on-call/weekend/after hours pager

## 2023-06-12 NOTE — Progress Notes (Signed)
  Echocardiogram 2D Echocardiogram has been performed.  Maren Reamer 06/12/2023, 12:04 PM

## 2023-06-12 NOTE — Progress Notes (Signed)
Initial Nutrition Assessment  DOCUMENTATION CODES:   Not applicable  INTERVENTION:   Initiate tube feeding via Cortrak tube: Osmolite 1.2 at 25 ml/h and increase by 10 ml every 8 hours to goal rate of 45 ml/hr (1080 ml per day) Prosource TF20 60 ml daily  Provides 1376 kcal, 80 gm protein, 875 ml free water daily   NUTRITION DIAGNOSIS:   Inadequate oral intake related to inability to eat as evidenced by NPO status.  GOAL:   Patient will meet greater than or equal to 90% of their needs  MONITOR:   TF tolerance  REASON FOR ASSESSMENT:   Consult Enteral/tube feeding initiation and management  ASSESSMENT:   Yolanda [name] with PMH of dementia admitted with [facility] s/p L MCA aneurysm coil embolization.    Yolanda [name] discussed during ICU rounds and with RN. Per MD Yolanda [name] at high risk for cerebral vasospasm, requiring vasopressors for SBP 130-150, remains on nimetop.  Yolanda [name] agitated, required intubation overnight.   8/19 - s/p coil embolization of large L MCA aneurysm  8/20 - intubated late evening of 8/20 8/21 - s/p cortrak placement; per xray tube in stomach  Medications reviewed and include: colace, SSI, nimotop, protonix, miralax NS @ 75 ml/hr 15 mmol Kphos x 1   Labs reviewed: PO4 2.2 CBG's: 121-134  NUTRITION - FOCUSED PHYSICAL EXAM:  Deferred, Yolanda [name] getting ECHO at time of visit  Diet Order:   Diet Order     None       EDUCATION NEEDS:   No education needs have been identified at this time  Skin:  Skin Assessment: Reviewed RN Assessment  Last BM:  8/19  Height:   Ht Readings from Last 1 Encounters:  06/10/23 4\' 8"  (1.422 m)    Weight:   Wt Readings from Last 1 Encounters:  06/11/23 56.2 kg   BMI:  Body mass index is 27.78 kg/m.  Estimated Nutritional Needs:   Kcal:  1200-1400  Protein:  70-85 grams  Fluid:  > 1.5 L/day  Cammy Copa., RD, LDN, CNSC See AMiON for contact information

## 2023-06-13 ENCOUNTER — Inpatient Hospital Stay (HOSPITAL_COMMUNITY): Payer: Medicare Other

## 2023-06-13 DIAGNOSIS — I609 Nontraumatic subarachnoid hemorrhage, unspecified: Secondary | ICD-10-CM | POA: Diagnosis not present

## 2023-06-13 DIAGNOSIS — J9601 Acute respiratory failure with hypoxia: Secondary | ICD-10-CM

## 2023-06-13 LAB — CBC
HCT: 30.3 % — ABNORMAL LOW (ref 36.0–46.0)
Hemoglobin: 9.9 g/dL — ABNORMAL LOW (ref 12.0–15.0)
MCH: 28.6 pg (ref 26.0–34.0)
MCHC: 32.7 g/dL (ref 30.0–36.0)
MCV: 87.6 fL (ref 80.0–100.0)
Platelets: 127 10*3/uL — ABNORMAL LOW (ref 150–400)
RBC: 3.46 MIL/uL — ABNORMAL LOW (ref 3.87–5.11)
RDW: 14.2 % (ref 11.5–15.5)
WBC: 14 10*3/uL — ABNORMAL HIGH (ref 4.0–10.5)
nRBC: 0 % (ref 0.0–0.2)

## 2023-06-13 LAB — BASIC METABOLIC PANEL
Anion gap: 8 (ref 5–15)
Anion gap: 7 (ref 5–15)
BUN: 16 mg/dL (ref 8–23)
BUN: 18 mg/dL (ref 8–23)
CO2: 20 mmol/L — ABNORMAL LOW (ref 22–32)
CO2: 24 mmol/L (ref 22–32)
Calcium: 7.6 mg/dL — ABNORMAL LOW (ref 8.9–10.3)
Calcium: 7.3 mg/dL — ABNORMAL LOW (ref 8.9–10.3)
Chloride: 109 mmol/L (ref 98–111)
Chloride: 108 mmol/L (ref 98–111)
Creatinine, Ser: 0.78 mg/dL (ref 0.44–1.00)
Creatinine, Ser: 0.71 mg/dL (ref 0.44–1.00)
GFR, Estimated: >60 mL/min (ref >60)
GFR, Estimated: >60 mL/min (ref >60)
Glucose, Bld: 147 mg/dL — ABNORMAL HIGH (ref 70–99)
Glucose, Bld: 145 mg/dL — ABNORMAL HIGH (ref 70–99)
Potassium: 3.9 mmol/L (ref 3.5–5.1)
Potassium: 3.5 mmol/L (ref 3.5–5.1)
Sodium: 137 mmol/L (ref 135–145)
Sodium: 139 mmol/L (ref 135–145)

## 2023-06-13 LAB — GLUCOSE, CAPILLARY
Glucose-Capillary: 148 mg/dL — ABNORMAL HIGH (ref 70–99)
Glucose-Capillary: 161 mg/dL — ABNORMAL HIGH (ref 70–99)
Glucose-Capillary: 199 mg/dL — ABNORMAL HIGH (ref 70–99)
Glucose-Capillary: 156 mg/dL — ABNORMAL HIGH (ref 70–99)
Glucose-Capillary: 151 mg/dL — ABNORMAL HIGH (ref 70–99)

## 2023-06-13 LAB — MAGNESIUM
Magnesium: 2 mg/dL (ref 1.7–2.4)
Magnesium: 2 mg/dL (ref 1.7–2.4)

## 2023-06-13 LAB — PHOSPHORUS
Phosphorus: 1.8 mg/dL — ABNORMAL LOW (ref 2.5–4.6)
Phosphorus: 2.8 mg/dL (ref 2.5–4.6)
Phosphorus: 2.8 mg/dL (ref 2.5–4.6)

## 2023-06-13 MED ORDER — BANATROL TF EN LIQD
60.0000 mL | Freq: Two times a day (BID) | ENTERAL | Status: DC
  Administered 2023-06-13 – 2023-06-19 (×14): 60 mL
  Filled 2023-06-13 (×14): qty 60

## 2023-06-13 MED ORDER — POTASSIUM CHLORIDE 20 MEQ PO PACK
40.0000 meq | PACK | Freq: Once | ORAL | Status: AC
  Administered 2023-06-13: 40 meq
  Filled 2023-06-13: qty 2

## 2023-06-13 MED ORDER — FUROSEMIDE 10 MG/ML IJ SOLN
40.0000 mg | Freq: Once | INTRAMUSCULAR | Status: AC
  Administered 2023-06-13: 40 mg via INTRAVENOUS
  Filled 2023-06-13: qty 4

## 2023-06-13 MED ORDER — NIMODIPINE 30 MG PO CAPS
60.0000 mg | ORAL_CAPSULE | ORAL | Status: DC
  Administered 2023-06-22 – 2023-06-24 (×3): 60 mg via ORAL
  Filled 2023-06-13 (×7): qty 2

## 2023-06-13 MED ORDER — NIMODIPINE 6 MG/ML PO SOLN
60.0000 mg | ORAL | Status: DC
  Administered 2023-06-13 – 2023-06-26 (×76): 60 mg
  Filled 2023-06-13 (×79): qty 10

## 2023-06-13 MED ORDER — NIMODIPINE 30 MG PO CAPS: 60.0000 mg | ORAL_CAPSULE | ORAL | Status: DC

## 2023-06-13 MED ORDER — CALCIUM GLUCONATE-NACL 2-0.675 GM/100ML-% IV SOLN
2.0000 g | Freq: Once | INTRAVENOUS | Status: AC
  Administered 2023-06-13: 2000 mg via INTRAVENOUS
  Filled 2023-06-13: qty 100

## 2023-06-13 MED ORDER — NIMODIPINE 6 MG/ML PO SOLN: 60.0000 mg | ORAL | Status: DC

## 2023-06-13 MED ORDER — POTASSIUM PHOSPHATES 15 MMOLE/5ML IV SOLN
30.0000 mmol | Freq: Once | INTRAVENOUS | Status: AC
  Administered 2023-06-13: 30 mmol via INTRAVENOUS
  Filled 2023-06-13: qty 10

## 2023-06-13 MED ORDER — LEVETIRACETAM 500 MG PO TABS
500.0000 mg | ORAL_TABLET | Freq: Two times a day (BID) | ORAL | Status: AC
  Administered 2023-06-13 – 2023-06-17 (×10): 500 mg
  Filled 2023-06-13 (×10): qty 1

## 2023-06-13 NOTE — Progress Notes (Signed)
NAMEGlorietta [name], MRN:  [id number], DOB:  Aug 22, 1933, LOS: 3 ADMISSION DATE:  06/10/2023, CONSULTATION DATE:  06/10/2023 REFERRING MD:  Ivan Croft, CHIEF COMPLAINT:  Subarachnoid Hemorrhage with multiple aneurysms/ HTN   History of Present Illness:  89 year old female with history of mild asthma and HTN presents 06/10/2023 to the ED with acute onset of severe headache. Pt. Was last known normal at 10 PM 8/18. She awoke at 6 am 06/10/2023 with severe HA. No history of trauma or fall. Patient with several  episodes of emesis requiring antiemetics. Initial NIH score on arrival to the ED was 22, since arriving to the ED patient is much more alert, following commands. She initially had right sided weakness , that is less evident on my exam. Right facial droop.  Initial Hunt Hess Score is 3. Continues to have severe headache and photophobia. Patient speaks [place]., her son translates.   CT scan demonstrates evidence of a diffuse Grade 3 subarachnoid hemorrhage more prominent in her left sylvian fissure. 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   Neurosurgery has been consulted to manage subarachnoid bleed and aneurysm management. PCCM have been asked to admit and manage ICU care.   Pertinent  Medical History   Past Medical History:  Diagnosis Date   Closed fracture of left proximal humerus 03/27/2021   Closed fracture of lower end of left radius with routine healing 10/31/2020   Dementia (HCC)    Mild intermittent asthma without complication 04/04/2019   OSTEOARTHRITIS 08/04/2010   Annotation: Bilateral knees and ankles Qualifier: Diagnosis of  By: Delrae Alfred MD, [address] [facility] Events: Including procedures, antibiotic start and stop dates in addition to other pertinent events   06/10/2023 Admission [facility] 8/20 intubated due to worsening AMS and requiring levo for sbp goal 130-150 8/21 weaned  off levo  Interim History / Subjective:   Off pressors yesterday CVP 14; UOP last 24 hours 3.3 L: CXR slight improvement in aeration but pulmonary edema remains present On psv 10/5 50% sats 91% Per nurse patient more awake today; opens eyes to voice; following some commands; PERRL  Objective   Blood pressure (!) 118/48, pulse 82, temperature 99.6 F (37.6 C), temperature source Axillary, resp. rate (!) 23, height 4\' 8"  (1.422 m), weight 56.2 kg, SpO2 94%. CVP:  [6 mmHg-24 mmHg] 14 mmHg  Vent Mode: PSV;CPAP FiO2 (%):  [40 %-60 %] 60 % PEEP:  [5 cmH20] 5 cmH20 Pressure Support:  [15 cmH20] 15 cmH20   Intake/Output Summary (Last 24 hours) at 06/13/2023 0865 Last data filed at 06/13/2023 0600 Gross per 24 hour  Intake 2372.25 ml  Output 3325 ml  Net -952.75 ml   Filed Weights   06/10/23 0600 06/10/23 0725 06/11/23 0000  Weight: 58 kg 58 kg 56.2 kg    Examination: General:  critically ill elderly appearing female on mech vent HEENT: MM pink/moist; ETT in place Neuro: on 25 mcg fent; Per nurse patient more awake today; opens eyes to voice; following some commands; PERRL CV: s1s2, RRR, no m/r/g PULM:  dim rhonchi BS bilaterally; on mech vent PSV GI: soft, bsx4 active  Extremities: warm/dry, no edema  Skin: no rashes or lesions   Resolved Hospital Problem list     Assessment & Plan:  Grade 3 subarachnoid hemorrhage likely secondary to MCA aneurysm rupture.  Acute encephalopathy: delirium; hx of dementia per family High risk vasospasm  HTN Plan: -NSG following; appreciate recs -frequent neuro checks -limit sedating meds -Keppra bid; seizure precautions -SBP goal per NSG -cont nimotop -Continue neuroprotective measures- normothermia, euglycemia, HOB greater than 30, head in neutral alignment, normocapnia, normoxia.  -PT/OT/SLP when appropriate  Acute respiratory failure: intubated for poor gcs Plan: -PSV trials during day as tolerated; will give lasix today given  pulmonary edema and cvp 14. Will likely hold on extubation given increased o2 requirements -Wean PEEP/FiO2 for SpO2 >92% -VAP bundle in place -Daily SAT and SBT -PAD protocol in place -wean sedation for RASS goal 0 to -1 -Follow intermittent CXR and ABG PRN -check echo -consider dc'ing unasyn given pct 0.38; follow trach aspirate  Hypotension: since starting nimotop has become more hypotensive requiring increasing pressors Plan: -improved -sbp goals per nsg  Acute Diastolic CHF: echo 8/21 LVEF 70-75%; grade 1 diastolic dysfunction Pulmonary edema: elevated BNP Plan: -trend cvp and uop -lasix x1 today -daily weights  Hyperglycemia Prediabetes -A1c 5.8 Plan: -ssi and cbg monitoring  Nausea  Plan: -hold on zofran given qtc prolongation  Hypokalemia Hypomagnesemia Hypophosphatemia Plan: -replet k phos -trend bmp, phosph and mag  Anemia  Thrombocytopenia Plan: -trend cbc  Best Practice (right click and "Reselect all SmartList Selections" daily)   Diet/type: tubefeeds DVT prophylaxis: SCD GI prophylaxis: PPI Lines: CVL Foley:  Yes, and it is still needed Code Status:  full code Last date of multidisciplinary goals of care discussion [8/21 updated grandson and granddaughter at bedside ]  Labs   CBC: Recent Labs  Lab 06/10/23 0635 06/10/23 0636 06/10/23 0800 06/11/23 0457 06/12/23 0049 06/12/23 0242 06/12/23 0530 06/12/23 0813 06/13/23 0240  WBC 7.6  --  11.1* 13.2*  --   --   --  20.0* 14.0*  NEUTROABS 2.6  --   --   --   --   --   --   --   --   HGB 13.0   < > 13.1 12.7 12.6 12.2 11.9* 11.2* 9.9*  HCT 39.9   < > 40.7 38.2 37.0 36.0 35.0* 34.6* 30.3*  MCV 87.3  --  89.1 86.6  --   --   --  90.6 87.6  PLT 206  --  208 241  --   --   --  231 127*   < > = values in this interval not displayed.    Basic Metabolic Panel: Recent Labs  Lab 06/10/23 0800 06/11/23 0457 06/11/23 1927 06/12/23 0049 06/12/23 0242 06/12/23 0530 06/12/23 0813  06/12/23 1453 06/13/23 0240  NA 135 139 139   < > 141 140 136 139 137  K 4.0 3.3* 3.5   < > 3.8 3.7 3.6 3.4* 3.9  CL 103 109 114*  --   --   --  109 103 109  CO2 20* 20* 17*  --   --   --  18* 21* 20*  GLUCOSE 155* 173* 180*  --   --   --  241* 119* 147*  BUN 10 10 12   --   --   --  10 11 16   CREATININE 0.95 0.88 0.94  --   --   --  0.85 0.81 0.78  CALCIUM 8.6* 8.0* 7.2*  --   --   --  7.1* 7.9* 7.6*  MG 1.7 1.8 2.5*  --   --   --  2.1  --  2.0  PHOS 3.2 2.8  --   --   --   --  2.2*  --  1.8*   < > = values in this interval not displayed.   GFR: Estimated Creatinine Clearance: 34 mL/min (by C-G formula based on SCr of 0.78 mg/dL). Recent Labs  Lab 06/10/23 0800 06/11/23 0457 06/12/23 0813 06/12/23 1052 06/13/23 0240  PROCALCITON  --   --   --  0.38  --   WBC 11.1* 13.2* 20.0*  --  14.0*    Liver Function Tests: Recent Labs  Lab 06/10/23 0800 06/12/23 1052  AST 34 31  ALT 32 56*  ALKPHOS 60 52  BILITOT 1.3* 1.7*  PROT 7.2 6.6  ALBUMIN 3.7 3.2*   No results for input(s): "LIPASE", "AMYLASE" in the last 168 hours. Recent Labs  Lab 06/12/23 1052  AMMONIA 23    ABG    Component Value Date/Time   PHART 7.272 (L) 06/12/2023 0530   PCO2ART 40.2 06/12/2023 0530   PO2ART 88 06/12/2023 0530   HCO3 18.6 (L) 06/12/2023 0530   TCO2 20 (L) 06/12/2023 0530   ACIDBASEDEF 8.0 (H) 06/12/2023 0530   O2SAT 95 06/12/2023 0530     Coagulation Profile: Recent Labs  Lab 06/10/23 0635  INR 1.0    Cardiac Enzymes: Recent Labs  Lab 06/10/23 0800  CKTOTAL 32*  CKMB 1.9    HbA1C: Hgb A1c MFr Bld  Date/Time Value Ref Range Status  06/12/2023 08:13 AM 5.8 (H) 4.8 - 5.6 % Final    Comment:    (NOTE) Pre diabetes:          5.7%-6.4%  Diabetes:              >6.4%  Glycemic control for   <7.0% adults with diabetes     CBG: Recent Labs  Lab 06/12/23 1121 06/12/23 1520 06/12/23 1948 06/12/23 2356 06/13/23 0354  GLUCAP 134* 121* 119* 177* 148*    Review of  Systems:   Through her son as Nurse, learning disability, headache with light sensitivity, nausea.  Past Medical History:  She,  has a past medical history of Closed fracture of left proximal humerus (03/27/2021), Closed fracture of lower end of left radius with routine healing (10/31/2020), Dementia (HCC), Mild intermittent asthma without complication (04/04/2019), and OSTEOARTHRITIS (08/04/2010).   Surgical History:   Past Surgical History:  Procedure Laterality Date   IR 3D INDEPENDENT WKST  06/10/2023   IR ANGIO INTRA EXTRACRAN SEL INTERNAL CAROTID UNI L MOD SED  06/11/2023   IR ANGIOGRAM FOLLOW UP STUDY  06/10/2023   IR ANGIOGRAM FOLLOW UP STUDY  06/10/2023   IR NEURO EACH ADD'L AFTER BASIC UNI LEFT (MS)  06/10/2023   IR TRANSCATH/EMBOLIZ  06/10/2023   RADIOLOGY WITH ANESTHESIA N/A 06/10/2023   Procedure: IR WITH ANESTHESIA;  Surgeon: Lisbeth Renshaw, MD;  Location: [facility] OR;  Service: Radiology;  Laterality: N/A;     Social History:   reports that she has never smoked. She does not have any smokeless tobacco history on file. She reports that she does not drink alcohol and does not use drugs.   Family History:  Her family history is not on file.   Allergies No Known Allergies   Home Medications  Prior to Admission medications   Medication Sig Start Date End Date Taking? Authorizing Provider  Cholecalciferol (VITAMIN D3) 1.25 MG (50000 UT) CAPS Take 1 capsule by mouth once a week. 11/08/21   [provider]  diclofenac sodium (VOLTAREN) 1 % GEL Apply 1 application topically 4 (four) times daily. 4gm per dose 11/23/11   Linna Hoff, MD  Guaifenesin-Codeine  400-10 MG TABS Take 1 tablet by mouth 4 (four) times daily as needed. 04/06/12   Moreno-Coll, Adlih, MD  HYDROcodone-acetaminophen (NORCO) 5-325 MG tablet Take 1 tablet by mouth every 6 (six) hours as needed. 03/21/21   Dartha Lodge, PA-C  pantoprazole (PROTONIX) 20 MG tablet Take 1 tablet (20 mg total) by mouth daily for 14 days. 04/04/22  05/02/22  Sloan Leiter, DO  sucralfate (CARAFATE) 1 g tablet Take 1 tablet (1 g total) by mouth 4 (four) times daily -  with meals and at bedtime for 7 days. 04/04/22 05/02/22  Sloan Leiter, DO     Critical care time: 45 minutes    JD Anselm Lis [facility] 06/13/2023, 7:07 AM  Please see [email or web address] for pager details.  From 7A-7P if no response, please call [phone]. After hours, please call ELink [phone].

## 2023-06-13 NOTE — Evaluation (Signed)
Occupational Therapy Evaluation Patient Details Name: Yolanda [name] MRN: [id number] DOB: 25-Jul-1933 Today's Date: 06/13/2023   History of Present Illness Pt is an 89 y.o. female admitted with left SAH.  She underwent s/p subtotal coil embolization of large left MCA aneurysm on 8/19.  PMH left humerus and radial fracture, dementia, and asthma.   Clinical Impression   Pt currently still on the vent and restless without sedation.  Not consistently following one step commands and inconsistent with head nods "yes/no" to basic questions.  Total assist +2 for rolling in bed, supine to sit, sit to supine and for sitting balance EOB.  BP in sitting at 139/47 with HR increasing from low 80s at rest up to 102.  Pt's granddaughter present for session and reports that patient lives with family and they can provide 24 hour support.  Feel she will benefit from acute care OT at this time to progress cognition and participation/independence with basic ADL tasks.  Patient will benefit from intensive inpatient follow up therapy, >3 hours/day post acute stay.        If plan is discharge home, recommend the following: A little help with walking and/or transfers;Two people to help with walking and/or transfers;Two people to help with bathing/dressing/bathroom;Direct supervision/assist for medications management;Assistance with feeding;Assist for transportation;Assistance with cooking/housework;Direct supervision/assist for financial management;Supervision due to cognitive status    Functional Status Assessment  Patient has had a recent decline in their functional status and demonstrates the ability to make significant improvements in function in a reasonable and predictable amount of time.  Equipment Recommendations  Other (comment) (TBD next venue of care)    Recommendations for Other Services Rehab consult     Precautions / Restrictions Precautions Precautions: Fall Restrictions Weight Bearing Restrictions:  No      Mobility Bed Mobility Overal bed mobility: Needs Assistance Bed Mobility: Supine to Sit, Sit to Supine, Rolling Rolling: Total assist, +2 for safety/equipment   Supine to sit: +2 for safety/equipment, Total assist, +2 for physical assistance Sit to supine: Total assist, +2 for safety/equipment, +2 for physical assistance   General bed mobility comments: Pt needs assist with all aspects of rolling and transitions to and from the EOB.    Transfers                          Balance Overall balance assessment: Needs assistance Sitting-balance support: Single extremity supported, Feet supported Sitting balance-Leahy Scale: Zero Sitting balance - Comments: Pt needs total assist for static sitting EOB       Standing balance comment: Did not attempt this session                           ADL either performed or assessed with clinical judgement   ADL Overall ADL's : Needs assistance/impaired     Grooming: Sitting;Total assistance Grooming Details (indicate cue type and reason): simulated EOB                             Functional mobility during ADLs: +2 for physical assistance;Total assistance (for transition to EOB) General ADL Comments: Pt needed total +2 for supine to sit with total assist for static sitting balance secondary to retropulsion.  Pt nodding head "yes/no" to general basic questions but only correct 1/3 questions asked.  Pt's granddaughter present for session and able to translate.  HR in the 80s at  rest, increasing up to 104 sitting EOB.  BP in sitting at 139/47.     Vision   Additional Comments: Pt sedated, able to open eyes and maintain EOB.  Will asses further in treatment     Perception Perception: Not tested       Praxis Praxis: Not tested       Pertinent Vitals/Pain Pain Assessment Pain Assessment: Faces Facial Expression: Relaxed, neutral Body Movements: Restlessness Muscle Tension: Relaxed Pain  Intervention(s): Monitored during session, Limited activity within patient's tolerance, Repositioned     Extremity/Trunk Assessment Upper Extremity Assessment Upper Extremity Assessment: RUE deficits/detail RUE Deficits / Details: Pt with no active movement noted in the RUE.  Slight flexor tone noted when therapist held the RUE at 90 degrees and there was passive resistance with return to her side.  No tone noted in elbow, wrist, or digits, but slight edema noted in the hand. RUE Sensation:  (unable to determine) RUE Coordination: decreased fine motor;decreased gross motor   Lower Extremity Assessment Lower Extremity Assessment: Defer to PT evaluation   Cervical / Trunk Assessment Cervical / Trunk Assessment:  (flexed posture in sitting)   Communication Communication Communication: Other (comment) (still ventilated) Cueing Techniques: Verbal cues;Tactile cues   Cognition   Behavior During Therapy: Restless Overall Cognitive Status: Impaired/Different from baseline                                 General Comments: Pt still on the vent and slightly resless when off sedation.  She was able to shake head "yes/no" to therapist's questions but only correct with 1/5.  Pt's granddaughter used for translation.  Not following one step commands at this time.  Will continue to assess in functional treatment sessions.     General Comments               Home Living Family/patient expects to be discharged to:: Private residence Living Arrangements: Children (son and daughter and grandkids) Available Help at Discharge: Family;Available 24 hours/day Type of Home: House Home Access: Level entry     Home Layout: Two level;Able to live on main level with bedroom/bathroom     Bathroom Shower/Tub: Tub/shower unit;Walk-in shower   Bathroom Toilet: Standard     Home Equipment: Cane - single point;Shower seat   Additional Comments: granddaughter providing info and not fully  sure on info      Prior Functioning/Environment Prior Level of Function : Needs assist             Mobility Comments: No AD, mod I ADLs Comments: Needs intermittent assistance for dressing and ADLs        OT Problem List: Decreased strength;Decreased knowledge of use of DME or AE;Impaired tone;Decreased range of motion;Decreased coordination;Decreased cognition;Decreased activity tolerance;Impaired balance (sitting and/or standing);Decreased safety awareness;Pain;Impaired UE functional use      OT Treatment/Interventions: Self-care/ADL training;Patient/family education;Balance training;Neuromuscular education;Therapeutic activities;DME and/or AE instruction;Cognitive remediation/compensation;Therapeutic exercise    OT Goals(Current goals can be found in the care plan section) Acute Rehab OT Goals Patient Stated Goal: None stated during session OT Goal Formulation: Patient unable to participate in goal setting Time For Goal Achievement: 06/27/23 Potential to Achieve Goals: Good  OT Frequency: Min 1X/week    Co-evaluation PT/OT/SLP Co-Evaluation/Treatment: Yes     OT goals addressed during session: ADL's and self-care      AM-PAC OT "6 Clicks" Daily Activity     Outcome Measure Help from  another person eating meals?: Total Help from another person taking care of personal grooming?: Total Help from another person toileting, which includes using toliet, bedpan, or urinal?: Total Help from another person bathing (including washing, rinsing, drying)?: Total Help from another person to put on and taking off regular upper body clothing?: Total Help from another person to put on and taking off regular lower body clothing?: Total 6 Click Score: 6   End of Session Nurse Communication: Other (comment) (progress of session)  Activity Tolerance: Other (comment) (restlessness and medical issues) Patient left: in bed;with call bell/phone within reach;with bed alarm set;with  family/visitor present  OT Visit Diagnosis: Unsteadiness on feet (R26.81);Other abnormalities of gait and mobility (R26.89);Muscle weakness (generalized) (M62.81);Hemiplegia and hemiparesis;Other symptoms and signs involving cognitive function;Cognitive communication deficit (R41.841) Symptoms and signs involving cognitive functions: Nontraumatic SAH Hemiplegia - Right/Left: Right Hemiplegia - dominant/non-dominant: Dominant Hemiplegia - caused by: Nontraumatic SAH                Time: 6213-0865 OT Time Calculation (min): 35 min Charges:  OT General Charges $OT Visit: 1 Visit OT Evaluation $OT Eval High Complexity: 1 High  Perrin Maltese, OTR/L Acute Rehabilitation Services  Office [phone] 06/13/2023

## 2023-06-13 NOTE — Evaluation (Signed)
Physical Therapy Evaluation Patient Details Name: Yolanda [name] MRN: [id number] DOB: [birth year] Today's Date: 06/13/2023  History of Present Illness  Pt is an 89 y.o. female who presented 06/10/23 with acute onset of severe headache and R-sided weakness. CT scan demonstrates evidence of a diffuse subarachnoid hemorrhage more prominent in her left sylvian fissure. 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 S/p coil embolization of L MCA aneurysm 8/19. Intubated 8/20. Cortrak placed 8/21. PMH: dementia, asthma, OA, HTN   Clinical Impression  Pt presents with condition above and deficits mentioned below, see PT Problem List. PTA, she was independent not utilizing an AD to mobilize, living with family in a 2-level house with a level entry. She can stay on the main level of the house. Currently, pt is demonstrating deficits in balance, activity tolerance, power, cognition, and strength, sensation, and proprioception in her R extremities. Pt tends to push herself posteriorly and to the R when sitting up, needing total assist for sitting balance and her knees blocked to prevent her from sliding herself off the EOB. She is also restless and impulsive to reach and pull at her lines with her L UE, needing blocking of her L UE often throughout session for her safety. She did not follow cues consistently (granddaughter provided interpretation), thus she required total assist x2 for all bed mobility aspects. Pt has had a drastic functional decline and could greatly benefit from intensive inpatient rehab, > 3 hours/day, provided she begins to be able to participate better. Will continue to follow acutely.        If plan is discharge home, recommend the following: Two people to help with walking and/or transfers;Two people to help with bathing/dressing/bathroom;Assistance with cooking/housework;Direct supervision/assist for  medications management;Direct supervision/assist for financial management;Assistance with feeding;Assist for transportation   Can travel by private vehicle        Equipment Recommendations Other (comment) (defer to next venue of care)  Recommendations for Other Services  Rehab consult    Functional Status Assessment Patient has had a recent decline in their functional status and demonstrates the ability to make significant improvements in function in a reasonable and predictable amount of time.     Precautions / Restrictions Precautions Precautions: Fall Precaution Comments: bil wrist restraints and L mitten; SBP goal 120-140; ETT; A-line; cortrak Restrictions Weight Bearing Restrictions: No      Mobility  Bed Mobility Overal bed mobility: Needs Assistance Bed Mobility: Supine to Sit, Sit to Supine, Rolling Rolling: Total assist, +2 for safety/equipment   Supine to sit: +2 for safety/equipment, Total assist, +2 for physical assistance, HOB elevated Sit to supine: Total assist, +2 for physical assistance, +2 for safety/equipment, HOB elevated   General bed mobility comments: Pt not following commands to manage legs on/off EOB and impulsive to reach and pull at tubes/lines with L UE, thereby needing total assist x2 for all bed mobility and to block her L UE for her safety    Transfers                   General transfer comment: deferred for safety due to strong retropulsion and pt impulsivity    Ambulation/Gait               General Gait Details: deferred for safety due to strong retropulsion and pt impulsivity  Stairs            Wheelchair Mobility  Tilt Bed    Modified Rankin (Stroke Patients Only) Modified Rankin (Stroke Patients Only) Pre-Morbid Rankin Score: Slight disability Modified Rankin: Severe disability     Balance Overall balance assessment: Needs assistance Sitting-balance support: Single extremity supported, Feet  supported Sitting balance-Leahy Scale: Zero Sitting balance - Comments: Pt with strong retropulsion sitting EOB and pushing self to R with L UE, total assist for static sitting balance and blocking of her knees to ensure she did not slide off EOB Postural control: Posterior lean, Right lateral lean     Standing balance comment: deferred for pt safety this session                             Pertinent Vitals/Pain Pain Assessment Pain Assessment: Faces Faces Pain Scale: Hurts little more Pain Location: generalized Pain Descriptors / Indicators: Discomfort, Grimacing, Guarding Pain Intervention(s): Limited activity within patient's tolerance, Monitored during session, Repositioned    Home Living Family/patient expects to be discharged to:: Private residence Living Arrangements: Children (son and daughter and grandkids) Available Help at Discharge: Family;Available 24 hours/day Type of Home: House Home Access: Level entry       Home Layout: Two level;Able to live on main level with bedroom/bathroom Home Equipment: Gilmer Mor - single point;Shower seat Additional Comments: granddaughter providing info and not fully sure on info    Prior Function Prior Level of Function : Needs assist             Mobility Comments: No AD, mod I ADLs Comments: Needs intermittent assistance for dressing and ADLs     Extremity/Trunk Assessment   Upper Extremity Assessment Upper Extremity Assessment: Defer to OT evaluation RUE Deficits / Details: Pt with no active movement noted in the RUE.  Slight flexor tone noted when therapist held the RUE at 90 degrees and there was passive resistance with return to her side.  No tone noted in elbow, wrist, or digits, but slight edema noted in the hand. RUE Sensation:  (unable to determine) RUE Coordination: decreased fine motor;decreased gross motor    Lower Extremity Assessment Lower Extremity Assessment: RLE deficits/detail RLE Deficits /  Details: Likely poor sensation/proprioception as pt would often get her R leg stuck under her L and seemed unaware of it, but difficult to formally assess while she is intubated and with impaired cognition; activation of quads noted to resist therapist PROM at times, but overall weak compared to L    Cervical / Trunk Assessment Cervical / Trunk Assessment: Other exceptions Cervical / Trunk Exceptions: short stature  Communication   Communication Communication: Other (comment) (intubated) Cueing Techniques: Verbal cues;Tactile cues  Cognition Arousal: Alert, Lethargic, Suspect due to medications (alert and restless initially, lethargic and sleeping after RN administered sedatives at end of session) Behavior During Therapy: Restless, Impulsive Overall Cognitive Status: Impaired/Different from baseline Area of Impairment: Following commands, Safety/judgement, Awareness, Problem solving, Attention                   Current Attention Level: Focused   Following Commands: Follows one step commands inconsistently, Follows one step commands with increased time Safety/Judgement: Decreased awareness of safety, Decreased awareness of deficits Awareness: Intellectual Problem Solving: Slow processing, Difficulty sequencing, Requires verbal cues, Requires tactile cues, Decreased initiation General Comments: Upon arrival, pt restless with legs off R side of bed and head towards L EOB and inferior in bed while intubated on vent. Pt becoming increasingly restless as she is stimulated with mobility.  Poor awareness of her safety, demonstrating strong retropulsion in sitting EOB and reaching for tubes/lines with L UE, needing blocking to ensure pt safety throughout. Does not follow simple cues consistently, only shaking head yes/no to questions 1/5x correctly.        General Comments General comments (skin integrity, edema, etc.): BP 133/57 supine, 139/47 sitting EOB, 117/46 supine end of session; SpO2  stable, ETT 50% FiO2 PEEP 8    Exercises     Assessment/Plan    PT Assessment Patient needs continued PT services  PT Problem List Decreased strength;Decreased activity tolerance;Decreased balance;Decreased mobility;Decreased coordination;Decreased cognition;Decreased safety awareness;Decreased knowledge of use of DME;Cardiopulmonary status limiting activity;Impaired sensation       PT Treatment Interventions DME instruction;Gait training;Functional mobility training;Therapeutic activities;Therapeutic exercise;Balance training;Neuromuscular re-education;Cognitive remediation;Patient/family education    PT Goals (Current goals can be found in the Care Plan section)  Acute Rehab PT Goals Patient Stated Goal: did not state; family hopeful for pt to improve PT Goal Formulation: With patient/family Time For Goal Achievement: 06/27/23 Potential to Achieve Goals: Fair    Frequency Min 1X/week     Co-evaluation PT/OT/SLP Co-Evaluation/Treatment: Yes Reason for Co-Treatment: Necessary to address cognition/behavior during functional activity;For patient/therapist safety;To address functional/ADL transfers PT goals addressed during session: Mobility/safety with mobility;Balance OT goals addressed during session: ADL's and self-care       AM-PAC PT "6 Clicks" Mobility  Outcome Measure Help needed turning from your back to your side while in a flat bed without using bedrails?: Total Help needed moving from lying on your back to sitting on the side of a flat bed without using bedrails?: Total Help needed moving to and from a bed to a chair (including a wheelchair)?: Total Help needed standing up from a chair using your arms (e.g., wheelchair or bedside chair)?: Total Help needed to walk in hospital room?: Total Help needed climbing 3-5 steps with a railing? : Total 6 Click Score: 6    End of Session Equipment Utilized During Treatment: Oxygen Activity Tolerance: Treatment limited  secondary to agitation;Other (comment) (restlessness) Patient left: in bed;with call bell/phone within reach;with bed alarm set;with family/visitor present;with restraints reapplied Nurse Communication: Mobility status;Other (comment) (restraints applied (no mitten on R - RN ok with this), pt restlessness) PT Visit Diagnosis: Muscle weakness (generalized) (M62.81);Difficulty in walking, not elsewhere classified (R26.2);Other symptoms and signs involving the nervous system (R29.898)    Time: 9937-1696 PT Time Calculation (min) (ACUTE ONLY): 37 min   Charges:   PT Evaluation $PT Eval Moderate Complexity: 1 Mod   PT General Charges $$ ACUTE PT VISIT: 1 Visit         Virgil Benedict Harlow Ohms), PT, DPT Acute Rehabilitation Services  Office: [phone]   Jewel Baize 06/13/2023, 1:56 PM

## 2023-06-13 NOTE — Progress Notes (Signed)
Nutrition Follow-up  DOCUMENTATION CODES:   Not applicable  INTERVENTION:   Tube feeding via Cortrak tube: Osmolite 1.2 @ 45 ml/hr (1080 ml per day) Prosource TF20 60 ml daily  Provides 1376 kcal, 80 gm protein, 875 ml free water daily  Add banatrol BID  NUTRITION DIAGNOSIS:   Inadequate oral intake related to inability to eat as evidenced by NPO status. Ongoing.   GOAL:   Patient will meet greater than or equal to 90% of their needs Met with TF at goal  MONITOR:   TF tolerance  REASON FOR ASSESSMENT:   Consult Enteral/tube feeding initiation and management  ASSESSMENT:   Pt with PMH of dementia admitted with [facility] s/p L MCA aneurysm coil embolization.    Pt discussed during ICU rounds and with RN. Pt remains intubated. Spoke with son at bedside. Pt eats very well at home.  NFPE completed, no depletions identified.  Per RN pt having diarrhea, pt remains on Nimotop.  Pt requiring phos supplementation   8/19 - s/p coil embolization of large L MCA aneurysm  8/20 - intubated late evening of 8/20 8/21 - s/p cortrak placement; per xray tube in stomach  Medications reviewed and include: colace, SSI, nimotop, protonix, miralax NS @ 75 ml/hr Fentanyl  30 mmol Kphos x 1   Labs reviewed: PO4 1.8 CBG's: 119-199  NUTRITION - FOCUSED PHYSICAL EXAM:  Complete, no depletions   Diet Order:   Diet Order     None       EDUCATION NEEDS:   No education needs have been identified at this time  Skin:  Skin Assessment: Reviewed RN Assessment  Last BM:  8/22 x 6  Height:   Ht Readings from Last 1 Encounters:  06/10/23 4\' 8"  (1.422 m)    Weight:   Wt Readings from Last 1 Encounters:  06/11/23 56.2 kg   BMI:  Body mass index is 27.78 kg/m.  Estimated Nutritional Needs:   Kcal:  1200-1400  Protein:  70-85 grams  Fluid:  > 1.5 L/day  Cammy Copa., RD, LDN, CNSC See AMiON for contact information

## 2023-06-13 NOTE — Progress Notes (Signed)
  NEUROSURGERY PROGRESS NOTE   No issues overnight.  EXAM:  BP (!) 132/52   Pulse 80   Temp 99.7 F (37.6 C) (Axillary)   Resp (!) 24   Ht 4\' 8"  (1.422 m)   Wt 56.2 kg   SpO2 92%   BMI 27.78 kg/m   Eyes open spontaneously Breathing over vent  Follows commands LUE/BLE No voluntary movement RUE Right groin site soft  TCD: Date POD PCO2 HCT BP   MCA ACA PCA OPHT SIPH VERT Basilar  8/21 SW         Right  Left   57  71   47 37      37 21        56  20   24  13    *  *       IMPRESSION:  89 y.o. female SAH d#3 s/p subtotal coil embolization of large left MCA aneurysm, neurologically stable with RUE monoparesis. Pulmonary status appears stable. Nonetheless, I suspect overall prognosis for good recovery for an 89yo patient presenting as grade 3 SAH is poor.  PLAN: - Would cont supportive care per PCCM - Maintain normotension. Pt not yet in spas window, and at 89yo, suspect her risk of spasm is on the lower side  I reviewed the situation and the above with family at bedside. All questions answered.   Lisbeth Renshaw, MD [facility]

## 2023-06-13 NOTE — Progress Notes (Signed)
   Inpatient Rehab Admissions Coordinator :  Per therapy recommendations patient was screened for CIR candidacy by Ottie Glazier RN MSN. Patient is not yet at a level to tolerate the intensity required to pursue a CIR admit . Remains on Vent. The CIR admissions team will follow and monitor for progress and place a Rehab Consult order if felt to be appropriate. Please contact me with any questions.  Ottie Glazier RN MSN Admissions Coordinator [phone]

## 2023-06-14 ENCOUNTER — Inpatient Hospital Stay (HOSPITAL_COMMUNITY): Payer: Medicare Other

## 2023-06-14 DIAGNOSIS — I609 Nontraumatic subarachnoid hemorrhage, unspecified: Secondary | ICD-10-CM | POA: Diagnosis not present

## 2023-06-14 LAB — BLOOD GAS, ARTERIAL
Acid-Base Excess: 5.1 mmol/L — ABNORMAL HIGH (ref 0.0–2.0)
Bicarbonate: 27.8 mmol/L (ref 20.0–28.0)
O2 Saturation: 97.6 %
Patient temperature: 37.6
pCO2 arterial: 35 mmHg (ref 32–48)
pH, Arterial: 7.51 — ABNORMAL HIGH (ref 7.35–7.45)
pO2, Arterial: 128 mmHg — ABNORMAL HIGH (ref 83–108)

## 2023-06-14 LAB — CULTURE, RESPIRATORY W GRAM STAIN
Culture: NORMAL
Gram Stain: NONE SEEN

## 2023-06-14 LAB — BASIC METABOLIC PANEL
Anion gap: 11 (ref 5–15)
BUN: 17 mg/dL (ref 8–23)
CO2: 24 mmol/L (ref 22–32)
Calcium: 7.9 mg/dL — ABNORMAL LOW (ref 8.9–10.3)
Chloride: 105 mmol/L (ref 98–111)
Creatinine, Ser: 0.82 mg/dL (ref 0.44–1.00)
GFR, Estimated: >60 mL/min (ref >60)
Glucose, Bld: 150 mg/dL — ABNORMAL HIGH (ref 70–99)
Potassium: 3.5 mmol/L (ref 3.5–5.1)
Sodium: 140 mmol/L (ref 135–145)

## 2023-06-14 LAB — HEPATIC FUNCTION PANEL
ALT: 33 U/L (ref 0–44)
AST: 15 U/L (ref 15–41)
Albumin: 2.7 g/dL — ABNORMAL LOW (ref 3.5–5.0)
Alkaline Phosphatase: 53 U/L (ref 38–126)
Bilirubin, Direct: 0.2 mg/dL (ref 0.0–0.2)
Indirect Bilirubin: 1.1 mg/dL — ABNORMAL HIGH (ref 0.3–0.9)
Total Bilirubin: 1.3 mg/dL — ABNORMAL HIGH (ref 0.3–1.2)
Total Protein: 6.1 g/dL — ABNORMAL LOW (ref 6.5–8.1)

## 2023-06-14 LAB — CBC
HCT: 30.3 % — ABNORMAL LOW (ref 36.0–46.0)
Hemoglobin: 9.7 g/dL — ABNORMAL LOW (ref 12.0–15.0)
MCH: 28.4 pg (ref 26.0–34.0)
MCHC: 32 g/dL (ref 30.0–36.0)
MCV: 88.9 fL (ref 80.0–100.0)
Platelets: 149 10*3/uL — ABNORMAL LOW (ref 150–400)
RBC: 3.41 MIL/uL — ABNORMAL LOW (ref 3.87–5.11)
RDW: 14.6 % (ref 11.5–15.5)
WBC: 13.3 10*3/uL — ABNORMAL HIGH (ref 4.0–10.5)
nRBC: 0 % (ref 0.0–0.2)

## 2023-06-14 LAB — MAGNESIUM: Magnesium: 2 mg/dL (ref 1.7–2.4)

## 2023-06-14 LAB — GLUCOSE, CAPILLARY
Glucose-Capillary: 126 mg/dL — ABNORMAL HIGH (ref 70–99)
Glucose-Capillary: 156 mg/dL — ABNORMAL HIGH (ref 70–99)
Glucose-Capillary: 157 mg/dL — ABNORMAL HIGH (ref 70–99)
Glucose-Capillary: 173 mg/dL — ABNORMAL HIGH (ref 70–99)
Glucose-Capillary: 189 mg/dL — ABNORMAL HIGH (ref 70–99)
Glucose-Capillary: 190 mg/dL — ABNORMAL HIGH (ref 70–99)

## 2023-06-14 LAB — PHOSPHORUS: Phosphorus: 2.1 mg/dL — ABNORMAL LOW (ref 2.5–4.6)

## 2023-06-14 MED ORDER — ACETAMINOPHEN 650 MG RE SUPP: 650.0000 mg | Freq: Four times a day (QID) | RECTAL | Status: DC | PRN

## 2023-06-14 MED ORDER — DEXAMETHASONE SODIUM PHOSPHATE 4 MG/ML IJ SOLN
4.0000 mg | Freq: Four times a day (QID) | INTRAMUSCULAR | Status: AC
  Administered 2023-06-14 – 2023-06-16 (×8): 4 mg via INTRAVENOUS
  Filled 2023-06-14 (×8): qty 1

## 2023-06-14 MED ORDER — ORAL CARE MOUTH RINSE: 15.0000 mL | OROMUCOSAL | Status: DC | PRN

## 2023-06-14 MED ORDER — HALOPERIDOL LACTATE 5 MG/ML IJ SOLN: 2.0000 mg | Freq: Four times a day (QID) | INTRAMUSCULAR | Status: DC | PRN

## 2023-06-14 MED ORDER — HALOPERIDOL LACTATE 5 MG/ML IJ SOLN
2.0000 mg | Freq: Once | INTRAMUSCULAR | Status: AC
  Administered 2023-06-14: 2 mg via INTRAVENOUS

## 2023-06-14 MED ORDER — FUROSEMIDE 10 MG/ML IJ SOLN
40.0000 mg | Freq: Once | INTRAMUSCULAR | Status: AC
  Administered 2023-06-14: 40 mg via INTRAVENOUS
  Filled 2023-06-14: qty 4

## 2023-06-14 MED ORDER — HALOPERIDOL LACTATE 5 MG/ML IJ SOLN
INTRAMUSCULAR | Status: AC
  Filled 2023-06-14: qty 1

## 2023-06-14 MED ORDER — OLANZAPINE 10 MG IM SOLR
2.5000 mg | Freq: Once | INTRAMUSCULAR | Status: AC | PRN
  Administered 2023-06-14: 2.5 mg via INTRAMUSCULAR
  Filled 2023-06-14: qty 10

## 2023-06-14 MED ORDER — DEXMEDETOMIDINE HCL IN NACL 400 MCG/100ML IV SOLN
0.0000 ug/kg/h | INTRAVENOUS | Status: AC
  Administered 2023-06-15 (×2): 0.3 ug/kg/h via INTRAVENOUS
  Filled 2023-06-14: qty 100
  Filled 2023-06-14: qty 200

## 2023-06-14 MED ORDER — ORAL CARE MOUTH RINSE
15.0000 mL | OROMUCOSAL | Status: DC
  Administered 2023-06-14 – 2023-06-27 (×52): 15 mL via OROMUCOSAL

## 2023-06-14 MED ORDER — RACEPINEPHRINE HCL 2.25 % IN NEBU
0.5000 mL | INHALATION_SOLUTION | Freq: Once | RESPIRATORY_TRACT | Status: AC
  Administered 2023-06-14: 0.5 mL via RESPIRATORY_TRACT
  Filled 2023-06-14 (×2): qty 0.5

## 2023-06-14 MED ORDER — STERILE WATER FOR INJECTION IJ SOLN
INTRAMUSCULAR | Status: AC
  Administered 2023-06-15: 10 mL
  Filled 2023-06-14: qty 10

## 2023-06-14 MED ORDER — ACETAMINOPHEN 325 MG PO TABS
650.0000 mg | ORAL_TABLET | Freq: Four times a day (QID) | ORAL | Status: DC | PRN
  Administered 2023-06-15 – 2023-06-19 (×9): 650 mg
  Filled 2023-06-14 (×9): qty 2

## 2023-06-14 MED ORDER — POTASSIUM PHOSPHATES 15 MMOLE/5ML IV SOLN
15.0000 mmol | Freq: Once | INTRAVENOUS | Status: AC
  Administered 2023-06-14: 15 mmol via INTRAVENOUS
  Filled 2023-06-14: qty 5

## 2023-06-14 NOTE — Progress Notes (Signed)
Patient extubated per MD order at 1052. RN and family at bedside. Patient suctioned before and after extubation. Tolerated well. Vitals stable. Placed on 4L nasal cannula.

## 2023-06-14 NOTE — Plan of Care (Signed)
  Problem: Education: Goal: Knowledge of disease or condition will improve Outcome: Not Progressing Goal: Knowledge of secondary prevention will improve (MUST DOCUMENT ALL) Outcome: Not Progressing Goal: Knowledge of patient specific risk factors will improve (Mark N/A or DELETE if not current risk factor) Outcome: Not Progressing   

## 2023-06-14 NOTE — Progress Notes (Signed)
She has developed increased work of breathing.  Maintaining oxygenation.  Has stridor on auscultation.  Will give her racemic epinephrine nebulizer treatment, start decadron 4 mg q6h x 8 doses, and try on Bipap.  Will also give 40 mg lasix IV x one.  Updated her family at bedside and explained if these measure don't work, then she might need reintubation.  CC time 30 minutes  Coralyn Helling, MD [facility] Pulmonary/Critical Care Pager - [phone] or [phone] 06/14/2023, 4:47 PM

## 2023-06-14 NOTE — Progress Notes (Signed)
PT Cancellation Note  Patient Details Name: Yolanda [name] MRN: [id number] DOB: [birth year]   Cancelled Treatment:    Reason Eval/Treat Not Completed: Medical issues which prohibited therapy. Pt just extubated. PT to re-attempt as time allows.   Ilda Foil 06/14/2023, 10:57 AM

## 2023-06-14 NOTE — Progress Notes (Signed)
SLP Cancellation Note  Patient Details Name: Yolanda [name] MRN: [id number] DOB: 1933-01-14   Cancelled treatment:       Reason Eval/Treat Not Completed: Medical issues which prohibited therapy. Respiratory function and attention not yet appropriate for PO trials. Will f/u.    Yolanda [name], [name] 06/14/2023, 2:37 PM

## 2023-06-14 NOTE — Progress Notes (Signed)
  NEUROSURGERY PROGRESS NOTE   No issues overnight. Extubated today.  EXAM:  BP (!) 138/91   Pulse 87   Temp 98.9 F (37.2 C) (Axillary)   Resp (!) 25   Ht 4\' 8"  (1.422 m)   Wt 59.7 kg   SpO2 92%   BMI 29.51 kg/m   Awake, alert Follows commands LUE/BLE No voluntary movement RUE Right groin site soft  TCD: Date POD PCO2 HCT BP   MCA ACA PCA OPHT SIPH VERT Basilar  8/21 SW         Right  Left   57  71   47 37      37 21        56  20   24  13    *  *   *        8/23 SW         Right  Left    74   32    48   70    24   35    36   24    26   47    *   *    *         IMPRESSION:  89 y.o. female SAH d#4 s/p subtotal coil embolization of large left MCA aneurysm, neurologically stable with RUE monoparesis. Pulmonary status appears stable. Nonetheless, I suspect overall prognosis for good recovery for an 89yo patient presenting as grade 3 SAH is poor. TCD normal for now  PLAN: - Would cont supportive care per PCCM - Maintain normotension.  - Cont Nimotop - Would get PT/OT  I reviewed the situation and the above with family at bedside. All questions answered.   Lisbeth Renshaw, MD [facility]

## 2023-06-14 NOTE — Progress Notes (Signed)
eLink Physician-Brief Progress Note Patient Name: Yolanda [name] DOB: 06/20/1933 MRN: [id number]   Date of Service  06/14/2023  HPI/Events of Note  Patient with severe agitated delirium while on BIPAP. Haldol has apparently been ineffective.  eICU Interventions  Haldol discontinued. Trial of Zyprexa. If Zyprexa fails to control delirium will trial low dose Precedex gtt. ABG to r/o hypoxemia / hypercapnia contributing to delirium.        Migdalia Dk 06/14/2023, 8:34 PM

## 2023-06-14 NOTE — Progress Notes (Addendum)
Transcranial Doppler   Date POD PCO2 HCT BP   MCA ACA PCA OPHT SIPH VERT Basilar  8/21 SW         Right  Left   57  71   47 37      37 21        56  20   24  13    *  *   *        8/23 SW         Right  Left    74   32    48   70    24   35    36   24    26   47    *   *    *                 Right  Left                                                                 Right  Left                                                                 Right  Left                                                               Right  Left                                                               Right  Left                                                       MCA = Middle Cerebral Artery      OPHT = Opthalmic Artery     BASILAR = Basilar Artery   ACA = Anterior Cerebral Artery     SIPH = Carotid Siphon PCA = Posterior Cerebral Artery   VERT = Verterbral Artery                    Normal MCA = 62+\-12 ACA = 50+\-12 PCA = 42+\-23

## 2023-06-14 NOTE — Progress Notes (Signed)
   06/14/23 2000  Provider Notification  Provider Name/Title PCCM Dr Organ  Date Provider Notified 06/14/23  Time Provider Notified 2000  Method of Notification Page  Notification Reason Other (Comment) (Concerned if patient is suitable for BiPAP. Patient is disoriented *4, flaccid on right side, has mitt on left upper which is weak. She also has tube feeds going, and risk for aspiration. I am concerned  she is unable to take off bipap if needed.)  Provider response See new orders  Date of Provider Response 06/14/23  Time of Provider Response 2020

## 2023-06-14 NOTE — Progress Notes (Signed)
NAMEGerry [name], MRN:  [id number], DOB:  [birth year], LOS: 4 ADMISSION DATE:  06/10/2023, CONSULTATION DATE:  06/10/2023 REFERRING MD:  Ivan Croft, CHIEF COMPLAINT:  Subarachnoid Hemorrhage with multiple aneurysms/ HTN   History of Present Illness:  89 year old female with history of mild asthma and HTN presents 06/10/2023 to the ED with acute onset of severe headache. Pt. Was last known normal at 10 PM 8/18. She awoke at 6 am 06/10/2023 with severe HA. No history of trauma or fall. Patient with several  episodes of emesis requiring antiemetics. Initial NIH score on arrival to the ED was 22, since arriving to the ED patient is much more alert, following commands. She initially had right sided weakness , that is less evident on my exam. Right facial droop.  Initial Hunt Hess Score is 3. Continues to have severe headache and photophobia. Patient speaks [place]., her son translates.   CT scan demonstrates evidence of a diffuse Grade 3 subarachnoid hemorrhage more prominent in her left sylvian fissure. 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   Neurosurgery has been consulted to manage subarachnoid bleed and aneurysm management. PCCM have been asked to admit and manage ICU care.   Pertinent  Medical History   Past Medical History:  Diagnosis Date   Closed fracture of left proximal humerus 03/27/2021   Closed fracture of lower end of left radius with routine healing 10/31/2020   Dementia (HCC)    Mild intermittent asthma without complication 04/04/2019   OSTEOARTHRITIS 08/04/2010   Annotation: Bilateral knees and ankles Qualifier: Diagnosis of  By: Delrae Alfred MD, [facility] Events: Including procedures, antibiotic start and stop dates in addition to other pertinent events   06/10/2023 Admission [facility] 8/20 intubated due to worsening AMS and requiring levo for sbp goal 130-150 8/21 weaned  off levo 8/23 extubate  Interim History / Subjective:  Tolerating SBT.  Objective   Blood pressure (!) 113/49, pulse 82, temperature 99 F (37.2 C), temperature source Axillary, resp. rate 14, height 4\' 8"  (1.422 m), weight 59.7 kg, SpO2 91%. CVP:  [8 mmHg-29 mmHg] 13 mmHg  Vent Mode: CPAP;PSV FiO2 (%):  [50 %] 50 % PEEP:  [8 cmH20] 8 cmH20 Pressure Support:  [10 cmH20] 10 cmH20 Plateau Pressure:  [18 cmH20] 18 cmH20   Intake/Output Summary (Last 24 hours) at 06/14/2023 0850 Last data filed at 06/14/2023 0600 Gross per 24 hour  Intake 3336.64 ml  Output 4770 ml  Net -1433.36 ml   Filed Weights   06/10/23 0725 06/11/23 0000 06/14/23 0500  Weight: 58 kg 56.2 kg 59.7 kg    Examination:  General - alert Eyes - pupils reactive ENT - ETT in place Cardiac - regular rate/rhythm, no murmur Chest - equal breath sounds b/l, no wheezing or rales Abdomen - soft, non tender, + bowel sounds Extremities - no cyanosis, clubbing, or edema Skin - no rashes Neuro - follows commands  Resolved Hospital Problem list     Assessment & Plan:  Grade 3 subarachnoid hemorrhage likely secondary to MCA aneurysm rupture.  Acute encephalopathy: delirium; hx of dementia per family High risk vasospasm  HTN Plan: -NSG following; appreciate recs -frequent neuro checks -limit sedating meds -Keppra bid; seizure precautions -SBP goal per NSG -cont nimotop -Continue neuroprotective measures- normothermia, euglycemia, HOB greater than 30, head in neutral alignment, normocapnia, normoxia.  -PT/OT/SLP when appropriate  Acute respiratory failure: intubated for  poor gcs Plan: - proceed with extubation 8/23 - goal SpO2 > 92% - bronchial hygiene  Hypotension: since starting nimotop has become more hypotensive requiring increasing pressors Plan: - resolved  Acute Diastolic CHF: echo 8/21 LVEF 70-75%; grade 1 diastolic dysfunction Pulmonary edema: elevated BNP Plan: - monitor  hemodynamics  Hyperglycemia Prediabetes -A1c 5.8 Plan: -ssi and cbg monitoring  Nausea  Plan: -hold on zofran given qtc prolongation  Hypokalemia Hypomagnesemia Hypophosphatemia Plan: -replet k phos -trend bmp, phosph and mag  Anemia  Thrombocytopenia Plan: -trend cbc  Best Practice (right click and "Reselect all SmartList Selections" daily)   Diet/type: tubefeeds DVT prophylaxis: SCD GI prophylaxis: PPI Lines: CVL Foley:  Yes, and it is still needed Code Status:  full code Last date of multidisciplinary goals of care discussion [8/21 updated husband and granddaughter at bedside ]  Labs   CBC: Recent Labs  Lab 06/10/23 0635 06/10/23 0636 06/10/23 0800 06/11/23 0457 06/12/23 0049 06/12/23 0242 06/12/23 0530 06/12/23 0813 06/13/23 0240 06/14/23 0447  WBC 7.6  --  11.1* 13.2*  --   --   --  20.0* 14.0* 13.3*  NEUTROABS 2.6  --   --   --   --   --   --   --   --   --   HGB 13.0   < > 13.1 12.7   < > 12.2 11.9* 11.2* 9.9* 9.7*  HCT 39.9   < > 40.7 38.2   < > 36.0 35.0* 34.6* 30.3* 30.3*  MCV 87.3  --  89.1 86.6  --   --   --  90.6 87.6 88.9  PLT 206  --  208 241  --   --   --  231 127* 149*   < > = values in this interval not displayed.    Basic Metabolic Panel: Recent Labs  Lab 06/11/23 1927 06/12/23 0049 06/12/23 0813 06/12/23 1453 06/13/23 0240 06/13/23 1549 06/13/23 1800 06/14/23 0447  NA 139   < > 136 139 137 139  --  140  K 3.5   < > 3.6 3.4* 3.9 3.5  --  3.5  CL 114*  --  109 103 109 108  --  105  CO2 17*  --  18* 21* 20* 24  --  24  GLUCOSE 180*  --  241* 119* 147* 145*  --  150*  BUN 12  --  10 11 16 18   --  17  CREATININE 0.94  --  0.85 0.81 0.78 0.71  --  0.82  CALCIUM 7.2*  --  7.1* 7.9* 7.6* 7.3*  --  7.9*  MG 2.5*  --  2.1  --  2.0  --  2.0 2.0  PHOS  --   --  2.2*  --  1.8* 2.8 2.8 2.1*   < > = values in this interval not displayed.   GFR: Estimated Creatinine Clearance: 34.2 mL/min (by C-G formula based on SCr of 0.82  mg/dL). Recent Labs  Lab 06/11/23 0457 06/12/23 0813 06/12/23 1052 06/13/23 0240 06/14/23 0447  PROCALCITON  --   --  0.38  --   --   WBC 13.2* 20.0*  --  14.0* 13.3*    Liver Function Tests: Recent Labs  Lab 06/10/23 0800 06/12/23 1052 06/14/23 0447  AST 34 31 15  ALT 32 56* 33  ALKPHOS 60 52 53  BILITOT 1.3* 1.7* 1.3*  PROT 7.2 6.6 6.1*  ALBUMIN 3.7 3.2* 2.7*   No results for  input(s): "LIPASE", "AMYLASE" in the last 168 hours. Recent Labs  Lab 06/12/23 1052  AMMONIA 23    ABG    Component Value Date/Time   PHART 7.272 (L) 06/12/2023 0530   PCO2ART 40.2 06/12/2023 0530   PO2ART 88 06/12/2023 0530   HCO3 18.6 (L) 06/12/2023 0530   TCO2 20 (L) 06/12/2023 0530   ACIDBASEDEF 8.0 (H) 06/12/2023 0530   O2SAT 95 06/12/2023 0530     Coagulation Profile: Recent Labs  Lab 06/10/23 0635  INR 1.0    Cardiac Enzymes: Recent Labs  Lab 06/10/23 0800  CKTOTAL 32*  CKMB 1.9    HbA1C: Hgb A1c MFr Bld  Date/Time Value Ref Range Status  06/12/2023 08:13 AM 5.8 (H) 4.8 - 5.6 % Final    Comment:    (NOTE) Pre diabetes:          5.7%-6.4%  Diabetes:              >6.4%  Glycemic control for   <7.0% adults with diabetes     CBG: Recent Labs  Lab 06/13/23 1525 06/13/23 2100 06/14/23 0016 06/14/23 0411 06/14/23 0800  GLUCAP 156* 151* 173* 189* 156*   Critical care time: 33 minutes  Coralyn Helling, MD [place] Pulmonary/Critical Care Pager - [phone] or [phone] 06/14/2023, 8:52 AM

## 2023-06-14 NOTE — Progress Notes (Signed)
[facility] ADULT ICU REPLACEMENT PROTOCOL   The patient does apply for the [facility] Adult ICU Electrolyte Replacment Protocol based on the criteria listed below:   1.Exclusion criteria: TCTS, ECMO, Dialysis, and Myasthenia Gravis patients 2. Is GFR >/= 30 ml/min? Yes.    Patient's GFR today is >60 3. Is SCr </= 2? Yes.   Patient's SCr is 0.82 mg/dL 4. Did SCr increase >/= 0.5 in 24 hours? No. 5.Pt's weight >40kg  Yes.   6. Abnormal electrolyte(s): phos 2.1, potassium 3.5  7. Electrolytes replaced per protocol 8.  Call MD STAT for K+ </= 2.5, Phos </= 1, or Mag </= 1 Physician:  Dr. Matt Holmes 06/14/2023 6:12 AM

## 2023-06-15 DIAGNOSIS — I609 Nontraumatic subarachnoid hemorrhage, unspecified: Secondary | ICD-10-CM | POA: Diagnosis not present

## 2023-06-15 DIAGNOSIS — J9589 Other postprocedural complications and disorders of respiratory system, not elsewhere classified: Secondary | ICD-10-CM

## 2023-06-15 DIAGNOSIS — R410 Disorientation, unspecified: Secondary | ICD-10-CM | POA: Diagnosis not present

## 2023-06-15 LAB — GLUCOSE, CAPILLARY
Glucose-Capillary: 133 mg/dL — ABNORMAL HIGH (ref 70–99)
Glucose-Capillary: 141 mg/dL — ABNORMAL HIGH (ref 70–99)
Glucose-Capillary: 145 mg/dL — ABNORMAL HIGH (ref 70–99)
Glucose-Capillary: 156 mg/dL — ABNORMAL HIGH (ref 70–99)
Glucose-Capillary: 179 mg/dL — ABNORMAL HIGH (ref 70–99)
Glucose-Capillary: 186 mg/dL — ABNORMAL HIGH (ref 70–99)
Glucose-Capillary: 199 mg/dL — ABNORMAL HIGH (ref 70–99)

## 2023-06-15 LAB — BASIC METABOLIC PANEL
Anion gap: 16 — ABNORMAL HIGH (ref 5–15)
BUN: 23 mg/dL (ref 8–23)
CO2: 25 mmol/L (ref 22–32)
Calcium: 8.2 mg/dL — ABNORMAL LOW (ref 8.9–10.3)
Chloride: 103 mmol/L (ref 98–111)
Creatinine, Ser: 0.83 mg/dL (ref 0.44–1.00)
GFR, Estimated: >60 mL/min (ref >60)
Glucose, Bld: 174 mg/dL — ABNORMAL HIGH (ref 70–99)
Potassium: 3.5 mmol/L (ref 3.5–5.1)
Sodium: 144 mmol/L (ref 135–145)

## 2023-06-15 LAB — CBC
HCT: 30 % — ABNORMAL LOW (ref 36.0–46.0)
Hemoglobin: 9.7 g/dL — ABNORMAL LOW (ref 12.0–15.0)
MCH: 28.9 pg (ref 26.0–34.0)
MCHC: 32.3 g/dL (ref 30.0–36.0)
MCV: 89.3 fL (ref 80.0–100.0)
Platelets: 160 10*3/uL (ref 150–400)
RBC: 3.36 MIL/uL — ABNORMAL LOW (ref 3.87–5.11)
RDW: 14.3 % (ref 11.5–15.5)
WBC: 10.3 10*3/uL (ref 4.0–10.5)
nRBC: 0 % (ref 0.0–0.2)

## 2023-06-15 LAB — PHOSPHORUS: Phosphorus: 3.2 mg/dL (ref 2.5–4.6)

## 2023-06-15 LAB — MAGNESIUM: Magnesium: 2.2 mg/dL (ref 1.7–2.4)

## 2023-06-15 NOTE — Evaluation (Signed)
Clinical/Bedside Swallow Evaluation Patient Details  Name: Yolanda [name] MRN: [id number] Date of Birth: 05-24-1933  Today's Date: 06/15/2023 Time: SLP Start Time (ACUTE ONLY): 1135 SLP Stop Time (ACUTE ONLY): 1152 SLP Time Calculation (min) (ACUTE ONLY): 17 min  Past Medical History:  Past Medical History:  Diagnosis Date   Closed fracture of left proximal humerus 03/27/2021   Closed fracture of lower end of left radius with routine healing 10/31/2020   Dementia (HCC)    Mild intermittent asthma without complication 04/04/2019   OSTEOARTHRITIS 08/04/2010   Annotation: Bilateral knees and ankles Qualifier: Diagnosis of  By: Delrae Alfred MD, Lanora Manis     Past Surgical History:  Past Surgical History:  Procedure Laterality Date   IR 3D INDEPENDENT WKST  06/10/2023   IR ANGIO INTRA EXTRACRAN SEL INTERNAL CAROTID UNI L MOD SED  06/11/2023   IR ANGIOGRAM FOLLOW UP STUDY  06/10/2023   IR ANGIOGRAM FOLLOW UP STUDY  06/10/2023   IR NEURO EACH ADD'L AFTER BASIC UNI LEFT (MS)  06/10/2023   IR TRANSCATH/EMBOLIZ  06/10/2023   RADIOLOGY WITH ANESTHESIA N/A 06/10/2023   Procedure: IR WITH ANESTHESIA;  Surgeon: Lisbeth Renshaw, MD;  Location: [facility] OR;  Service: Radiology;  Laterality: N/A;   HPI:  89 year old female with history of mild asthma and HTN presents 06/10/2023 to the ED with acute onset of severe headache.  CT scan demonstrates evidence of a diffuse Grade 3 subarachnoid hemorrhage more prominent in her left sylvian fissure. 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 Pt was intubated 8/21-8/23.  Pt completed a BSE on 8/20, but was consulted for re-evaluation secondary to acute respiratory distress and intubation.    Assessment / Plan / Recommendation  Clinical Impression  Pt was seen for a clinical swallow reassessment post acute respiratory distress and intubation, and she presents with suspected oropharyngeal  dysphagia.  Attempted to use Publishing copy, but none were available at the time of this evaluation.  Granddaughter was present and willing to translate.  Pt was lethargic throughout this evaluation, but was able to attend to SLP and granddaughter and intermittently followed simple commands.  Pt was seen with trials of ice chips and thin liquids.  She refused puree on this date.  She exhibited mildly prolonged AP transit of ice chips and multiple swallows per bolus were noted with thin liquid via straw sip.  However, no overt s/sx of aspiration were observed.   Pt was additionally noted to be mildly impulsive, attempting to take multiple large straw sips of liquid despite verbal and tactile cues.  Pt was noted to have a hoarse and breathy vocal quality upon SLP arrival with congested sounding respirations.  Recommend continuation of NPO with ice chips and small sips of water PRN following thorough oral care ONLY with nursing and only if pt is awake/alert and requesting PO.  SLP will f/u closely to determine readiness for clinical diet initiation vs instrumental swallow study.  SLP Visit Diagnosis: Dysphagia, oropharyngeal phase (R13.12)    Aspiration Risk  Moderate aspiration risk    Diet Recommendation NPO;Ice chips PRN after oral care    Liquid Administration via: Spoon Medication Administration: Via alternative means Supervision: Staff to assist with self feeding;Full supervision/cueing for compensatory strategies Compensations: Slow rate;Small sips/bites;Minimize environmental distractions    Other  Recommendations Oral Care Recommendations: Oral care QID;Oral care prior to ice chip/H20 Caregiver Recommendations: Remove water pitcher;Have oral suction available    Recommendations  for follow up therapy are one component of a multi-disciplinary discharge planning process, led by the attending physician.  Recommendations may be updated based on patient status, additional functional criteria and  insurance authorization.  Follow up Recommendations Skilled nursing-short term rehab (<3 hours/day)      Assistance Recommended at Discharge    Functional Status Assessment Patient has had a recent decline in their functional status and demonstrates the ability to make significant improvements in function in a reasonable and predictable amount of time.  Frequency and Duration min 2x/week  2 weeks       Prognosis Prognosis for improved oropharyngeal function: Fair Barriers to Reach Goals: Cognitive deficits      Swallow Study   General Date of Onset: 06/15/23 HPI: 89 year old female with history of mild asthma and HTN presents 06/10/2023 to the ED with acute onset of severe headache.  CT scan demonstrates evidence of a diffuse Grade 3 subarachnoid hemorrhage more prominent in her left sylvian fissure. 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 Pt was intubated 8/21-8/23.  Pt completed a BSE on 8/20, but was consulted for re-evaluation secondary to acute respiratory distress and intubation. Type of Study: Bedside Swallow Evaluation Previous Swallow Assessment: See HPI Diet Prior to this Study: NPO;Cortrak/Small bore NG tube Temperature Spikes Noted: Yes Respiratory Status: Nasal cannula History of Recent Intubation: Yes Total duration of intubation (days): 2 days Date extubated: 06/14/23 Behavior/Cognition: Cooperative;Lethargic/Drowsy Oral Cavity Assessment: Dry Oral Care Completed by SLP: Recent completion by staff Oral Cavity - Dentition: Edentulous Self-Feeding Abilities: Needs assist Patient Positioning: Upright in bed Baseline Vocal Quality: Hoarse;Breathy Volitional Cough: Cognitively unable to elicit Volitional Swallow: Unable to elicit    Oral/Motor/Sensory Function Overall Oral Motor/Sensory Function:  (Pt with difficulty following commands for OME)   Ice Chips Ice chips: Within  functional limits Presentation: Spoon   Thin Liquid Thin Liquid: Impaired Presentation: Straw;Spoon Pharyngeal  Phase Impairments: Multiple swallows    Nectar Thick Nectar Thick Liquid: Not tested   Honey Thick Honey Thick Liquid: Not tested   Puree Puree:  (Pt refused) Presentation: Spoon   Solid     Solid: Not tested     Eino Farber, M.S., CCC-SLP Acute Rehabilitation Services Office: [phone]  Shanon Rosser Spence Soberano 06/15/2023,12:04 PM

## 2023-06-15 NOTE — Plan of Care (Signed)
  Problem: Education: Goal: Knowledge of disease or condition will improve Outcome: Not Progressing Goal: Knowledge of secondary prevention will improve (MUST DOCUMENT ALL) Outcome: Not Progressing Goal: Knowledge of patient specific risk factors will improve Loraine Leriche N/A or DELETE if not current risk factor) Outcome: Not Progressing   Problem: Spontaneous Subarachnoid Hemorrhage Tissue Perfusion: Goal: Complications of Spontaneous Subarachnoid Hemorrhage will be minimized Outcome: Not Progressing   Problem: Coping: Goal: Will verbalize positive feelings about self Outcome: Not Progressing Goal: Will identify appropriate support needs Outcome: Not Progressing   Problem: Health Behavior/Discharge Planning: Goal: Ability to manage health-related needs will improve Outcome: Not Progressing Goal: Goals will be collaboratively established with patient/family Outcome: Not Progressing   Problem: Self-Care: Goal: Ability to participate in self-care as condition permits will improve Outcome: Not Progressing Goal: Verbalization of feelings and concerns over difficulty with self-care will improve Outcome: Not Progressing Goal: Ability to communicate needs accurately will improve Outcome: Not Progressing   Problem: Nutrition: Goal: Risk of aspiration will decrease Outcome: Not Progressing Goal: Dietary intake will improve Outcome: Not Progressing

## 2023-06-15 NOTE — Progress Notes (Signed)
Has had some desaturation this morning, but will arouse to stimuli and regard the examiner.  Localizes briskly on the left but does not move her right side.  Following

## 2023-06-15 NOTE — Progress Notes (Signed)
Bipap not indicated at this time. Patient is resting comfortably on 6L [place]. Satting 100%.

## 2023-06-15 NOTE — Progress Notes (Signed)
Yolanda [name], MRN:  [id number], DOB:  November 21, 1932, LOS: 5 ADMISSION DATE:  06/10/2023, CONSULTATION DATE:  06/10/2023 REFERRING MD:  Ivan Croft, CHIEF COMPLAINT:  Subarachnoid Hemorrhage with multiple aneurysms/ HTN   History of Present Illness:  89 year old female with history of mild asthma and HTN presents 06/10/2023 to the ED with acute onset of severe headache. Pt. Was last known normal at 10 PM 8/18. She awoke at 6 am 06/10/2023 with severe HA. No history of trauma or fall. Patient with several  episodes of emesis requiring antiemetics. Initial NIH score on arrival to the ED was 22, since arriving to the ED patient is much more alert, following commands. She initially had right sided weakness , that is less evident on my exam. Right facial droop.  Initial Hunt Hess Score is 3. Continues to have severe headache and photophobia. Patient speaks [place]., her son translates.   CT scan demonstrates evidence of a diffuse Grade 3 subarachnoid hemorrhage more prominent in her left sylvian fissure. 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   Neurosurgery has been consulted to manage subarachnoid bleed and aneurysm management. PCCM have been asked to admit and manage ICU care.   Pertinent  Medical History   Past Medical History:  Diagnosis Date   Closed fracture of left proximal humerus 03/27/2021   Closed fracture of lower end of left radius with routine healing 10/31/2020   Dementia (HCC)    Mild intermittent asthma without complication 04/04/2019   OSTEOARTHRITIS 08/04/2010   Annotation: Bilateral knees and ankles Qualifier: Diagnosis of  By: Delrae Alfred MD, [facility] Events: Including procedures, antibiotic start and stop dates in addition to other pertinent events   06/10/2023 Admission [facility] 8/20 intubated due to worsening AMS and requiring levo for sbp goal 130-150 8/21 weaned  off levo 8/23 extubate, stridor >> racemic epi/decadron/bipap 8/24 agitated >> transiently on precedex  Interim History / Subjective:  Needed precedex overnight.  Off this AM.  Off Bipap.  Objective   Blood pressure (!) 141/62, pulse 71, temperature 98.8 F (37.1 C), temperature source Axillary, resp. rate 20, height 4\' 8"  (1.422 m), weight 59.7 kg, SpO2 98%. CVP:  [13 mmHg-18 mmHg] 18 mmHg  Vent Mode: BIPAP FiO2 (%):  [45 %-60 %] 45 % PEEP:  [5 cmH20-6 cmH20] 5 cmH20   Intake/Output Summary (Last 24 hours) at 06/15/2023 0753 Last data filed at 06/15/2023 0600 Gross per 24 hour  Intake 3218.22 ml  Output 2265 ml  Net 953.22 ml   Filed Weights   06/10/23 0725 06/11/23 0000 06/14/23 0500  Weight: 58 kg 56.2 kg 59.7 kg    Examination:  General - somnolent Eyes - pupils reactive ENT - no sinus tenderness, no stridor Cardiac - regular rate/rhythm, no murmur Chest - equal breath sounds b/l, no wheezing or rales Abdomen - soft, non tender, + bowel sounds Extremities - Lt hand swollen Skin - no rashes Neuro - not following commands  Resolved Hospital Problem list   Hypotension from sedation, Acute pulmonary edema  Assessment & Plan:   Grade 3 SAH 2nd to MCA aneurysm rupture. - neurosurgery following - f/u TCD - continue nimotop - keppra for seizure prophylaxis  Acute delirium. - precedex as needed - reorient as able  Post-extubation stridor. - improved 8/24 - complete course of decadron  Acute on chronic HFpEF. - monitor BP  Steroid induced hyperglycemia. -  SSI  Anemia of critical illness. - f/u CBC  Best Practice (right click and "Reselect all SmartList Selections" daily)   Diet/type: tubefeeds DVT prophylaxis: SCD GI prophylaxis: PPI Lines: CVL Foley:  Yes, and it is still needed Code Status:  full code Last date of multidisciplinary goals of care discussion [updated family at bedside on 8/24]  Labs       Latest Ref Rng & Units 06/15/2023     4:45 AM 06/14/2023    4:47 AM 06/13/2023    3:49 PM  CMP  Glucose 70 - 99 mg/dL 191  478  295   BUN 8 - 23 mg/dL 23  17  18    Creatinine 0.44 - 1.00 mg/dL 6.21  3.08  6.57   Sodium 135 - 145 mmol/L 144  140  139   Potassium 3.5 - 5.1 mmol/L 3.5  3.5  3.5   Chloride 98 - 111 mmol/L 103  105  108   CO2 22 - 32 mmol/L 25  24  24    Calcium 8.9 - 10.3 mg/dL 8.2  7.9  7.3   Total Protein 6.5 - 8.1 g/dL  6.1    Total Bilirubin 0.3 - 1.2 mg/dL  1.3    Alkaline Phos 38 - 126 U/L  53    AST 15 - 41 U/L  15    ALT 0 - 44 U/L  33         Latest Ref Rng & Units 06/15/2023    4:45 AM 06/14/2023    4:47 AM 06/13/2023    2:40 AM  CBC  WBC 4.0 - 10.5 K/uL 10.3  13.3  14.0   Hemoglobin 12.0 - 15.0 g/dL 9.7  9.7  9.9   Hematocrit 36.0 - 46.0 % 30.0  30.3  30.3   Platelets 150 - 400 K/uL 160  149  127     ABG    Component Value Date/Time   PHART 7.51 (H) 06/14/2023 2047   PCO2ART 35 06/14/2023 2047   PO2ART 128 (H) 06/14/2023 2047   HCO3 27.8 06/14/2023 2047   TCO2 20 (L) 06/12/2023 0530   ACIDBASEDEF 8.0 (H) 06/12/2023 0530   O2SAT 97.6 06/14/2023 2047    CBG (last 3)  Recent Labs    06/14/23 1909 06/15/23 0006 06/15/23 0357  GLUCAP 190* 133* 156*    Critical care time: 32 minutes  Coralyn Helling, MD [place] Pulmonary/Critical Care Pager - [phone] or [phone] 06/15/2023, 7:53 AM

## 2023-06-16 DIAGNOSIS — R410 Disorientation, unspecified: Secondary | ICD-10-CM | POA: Diagnosis not present

## 2023-06-16 DIAGNOSIS — I609 Nontraumatic subarachnoid hemorrhage, unspecified: Secondary | ICD-10-CM | POA: Diagnosis not present

## 2023-06-16 LAB — BASIC METABOLIC PANEL
Anion gap: 5 (ref 5–15)
BUN: 25 mg/dL — ABNORMAL HIGH (ref 8–23)
CO2: 24 mmol/L (ref 22–32)
Calcium: 8.4 mg/dL — ABNORMAL LOW (ref 8.9–10.3)
Chloride: 113 mmol/L — ABNORMAL HIGH (ref 98–111)
Creatinine, Ser: 0.62 mg/dL (ref 0.44–1.00)
GFR, Estimated: >60 mL/min (ref >60)
Glucose, Bld: 158 mg/dL — ABNORMAL HIGH (ref 70–99)
Potassium: 3.5 mmol/L (ref 3.5–5.1)
Sodium: 142 mmol/L (ref 135–145)

## 2023-06-16 LAB — GLUCOSE, CAPILLARY
Glucose-Capillary: 153 mg/dL — ABNORMAL HIGH (ref 70–99)
Glucose-Capillary: 152 mg/dL — ABNORMAL HIGH (ref 70–99)
Glucose-Capillary: 144 mg/dL — ABNORMAL HIGH (ref 70–99)
Glucose-Capillary: 170 mg/dL — ABNORMAL HIGH (ref 70–99)
Glucose-Capillary: 145 mg/dL — ABNORMAL HIGH (ref 70–99)

## 2023-06-16 MED ORDER — POTASSIUM CHLORIDE 10 MEQ/100ML IV SOLN
10.0000 meq | INTRAVENOUS | Status: AC
  Administered 2023-06-16 (×4): 10 meq via INTRAVENOUS
  Filled 2023-06-16 (×4): qty 100

## 2023-06-16 MED ORDER — DEXMEDETOMIDINE HCL IN NACL 400 MCG/100ML IV SOLN
0.0000 ug/kg/h | INTRAVENOUS | Status: DC
  Administered 2023-06-16: 0.3 ug/kg/h via INTRAVENOUS
  Filled 2023-06-16: qty 100

## 2023-06-16 MED ORDER — OXYCODONE HCL 5 MG PO TABS
5.0000 mg | ORAL_TABLET | Freq: Four times a day (QID) | ORAL | Status: DC | PRN
  Filled 2023-06-16 (×2): qty 1

## 2023-06-16 MED ORDER — RAMELTEON 8 MG PO TABS
8.0000 mg | ORAL_TABLET | Freq: Every day | ORAL | Status: DC
  Administered 2023-06-16 – 2023-06-19 (×4): 8 mg
  Filled 2023-06-16 (×5): qty 1

## 2023-06-16 MED ORDER — TRAZODONE HCL 50 MG PO TABS
50.0000 mg | ORAL_TABLET | Freq: Every day | ORAL | Status: DC
  Administered 2023-06-16 – 2023-06-19 (×4): 50 mg
  Filled 2023-06-16 (×4): qty 1

## 2023-06-16 MED ORDER — OXYCODONE HCL 5 MG PO TABS
5.0000 mg | ORAL_TABLET | Freq: Four times a day (QID) | ORAL | Status: DC | PRN
  Administered 2023-06-16 – 2023-06-19 (×9): 5 mg
  Filled 2023-06-16 (×11): qty 1

## 2023-06-16 MED ORDER — SERTRALINE HCL 20 MG/ML PO CONC
25.0000 mg | Freq: Every day | ORAL | Status: DC
  Administered 2023-06-16 – 2023-06-19 (×4): 25 mg
  Filled 2023-06-16 (×5): qty 1.25

## 2023-06-16 NOTE — Progress Notes (Signed)
1820- Pt getting increasingly agitated, restless, and tachypneic. Precedex gtt restarted at 1600. Pt also complaining of headache. PRN tylenol and oxy given. BP starting to increase. SBP 170s, goal < 150. CCM MD paged, will continue to monitor.   1845- Pt resting comfortably at this time, SBP 150s currently. Will make night shift RN aware of pt agitation.

## 2023-06-16 NOTE — Progress Notes (Signed)
[facility] ADULT ICU REPLACEMENT PROTOCOL   The patient does apply for the [facility] Adult ICU Electrolyte Replacment Protocol based on the criteria listed below:   1.Exclusion criteria: TCTS, ECMO, Dialysis, and Myasthenia Gravis patients 2. Is GFR >/= 30 ml/min? Yes.    Patient's GFR today is >60 3. Is SCr </= 2? Yes.   Patient's SCr is 0.62 mg/dL 4. Did SCr increase >/= 0.5 in 24 hours? No. 5.Pt's weight >40kg  Yes.   6. Abnormal electrolyte(s): K  7. Electrolytes replaced per protocol 8.  Call MD STAT for K+ </= 2.5, Phos </= 1, or Mag </= 1 Physician:  Minna Antis Hendrix Console 06/16/2023 6:00 AM

## 2023-06-16 NOTE — Progress Notes (Signed)
She opens her eyes to voice.  Remains right densely hemiparetic.  Localizes on the left.  This is stable.  Continue current management

## 2023-06-16 NOTE — Progress Notes (Signed)
NAMEDoloris Justis, MRN:  [id number], DOB:  [birth year], LOS: 6 ADMISSION DATE:  06/10/2023, CONSULTATION DATE:  06/10/2023 REFERRING Yolanda [name]:  Yolanda [name], CHIEF COMPLAINT:  Subarachnoid Hemorrhage with multiple aneurysms/ HTN   History of Present Illness:  89 year old female with history of mild asthma and HTN presents 06/10/2023 to the ED with acute onset of severe headache. Pt. Was last known normal at 10 PM 8/18. She awoke at 6 am 06/10/2023 with severe HA. No history of trauma or fall. Patient with several  episodes of emesis requiring antiemetics. Initial NIH score on arrival to the ED was 22, since arriving to the ED patient is much more alert, following commands. She initially had right sided weakness , that is less evident on my exam. Right facial droop.  Initial Hunt Hess Score is 3. Continues to have severe headache and photophobia. Patient speaks [place]., her son translates.   CT scan demonstrates evidence of a diffuse Grade 3 subarachnoid hemorrhage more prominent in her left sylvian fissure. 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   Neurosurgery has been consulted to manage subarachnoid bleed and aneurysm management. PCCM have been asked to admit and manage ICU care.   Pertinent  Medical History   Past Medical History:  Diagnosis Date   Closed fracture of left proximal humerus 03/27/2021   Closed fracture of lower end of left radius with routine healing 10/31/2020   Dementia (HCC)    Mild intermittent asthma without complication 04/04/2019   OSTEOARTHRITIS 08/04/2010   Annotation: Bilateral knees and ankles Qualifier: Diagnosis of  By: Delrae Alfred Yolanda [name], [facility] Events: Including procedures, antibiotic start and stop dates in addition to other pertinent events   06/10/2023 Admission [facility] 8/20 intubated due to worsening AMS and requiring levo for sbp goal 130-150 8/21 weaned  off levo 8/23 extubate, stridor >> racemic epi/decadron/bipap 8/24 agitated >> transiently on precedex 8/25 back on precedex  Interim History / Subjective:  C/o headache.  Agitated overnight and started back on precedex.  Objective   Blood pressure (!) 151/66, pulse 78, temperature 98 F (36.7 C), temperature source Axillary, resp. rate (!) 23, height 4\' 8"  (1.422 m), weight 59.7 kg, SpO2 99%.        Intake/Output Summary (Last 24 hours) at 06/16/2023 4259 Last data filed at 06/16/2023 0800 Gross per 24 hour  Intake 2923.5 ml  Output 1650 ml  Net 1273.5 ml   Filed Weights   06/10/23 0725 06/11/23 0000 06/14/23 0500  Weight: 58 kg 56.2 kg 59.7 kg    Examination:  General - alert Eyes - pupils reactive ENT - no sinus tenderness, no stridor Cardiac - regular rate/rhythm, no murmur Chest - equal breath sounds b/l, no wheezing or rales Abdomen - soft, non tender, + bowel sounds Extremities - mild swelling Lt hand Skin - no rashes Neuro - follows commands  Resolved Hospital Problem list   Hypotension from sedation, Acute pulmonary edema, Post-extubation stridor  Assessment & Plan:   Grade 3 SAH 2nd to MCA aneurysm rupture. - neurosurgery following - f/u TCD - continue nimotop - keppra for seizure prophylaxis - continue IV fluids - prn analgesia for pain control  Acute delirium. - wean precedex to keep RASS 0  - add zoloft  - trazodone, ramelteon at night - reorient as able  Acute on chronic HFpEF. - monitor BP  Steroid induced hyperglycemia. - SSI  Anemia of critical illness. - f/u CBC intermittently  Best Practice (right click and "Reselect all SmartList Selections" daily)   Diet/type: tubefeeds DVT prophylaxis: SCD GI prophylaxis: PPI Lines: CVL Foley:  Yes, and it is still needed Code Status:  full code Last date of multidisciplinary goals of care discussion [updated family at bedside on 8/24]  Labs       Latest Ref Rng & Units 06/16/2023     3:48 AM 06/15/2023    4:45 AM 06/14/2023    4:47 AM  CMP  Glucose 70 - 99 mg/dL 161  096  045   BUN 8 - 23 mg/dL 25  23  17    Creatinine 0.44 - 1.00 mg/dL 4.09  8.11  9.14   Sodium 135 - 145 mmol/L 142  144  140   Potassium 3.5 - 5.1 mmol/L 3.5  3.5  3.5   Chloride 98 - 111 mmol/L 113  103  105   CO2 22 - 32 mmol/L 24  25  24    Calcium 8.9 - 10.3 mg/dL 8.4  8.2  7.9   Total Protein 6.5 - 8.1 g/dL   6.1   Total Bilirubin 0.3 - 1.2 mg/dL   1.3   Alkaline Phos 38 - 126 U/L   53   AST 15 - 41 U/L   15   ALT 0 - 44 U/L   33        Latest Ref Rng & Units 06/15/2023    4:45 AM 06/14/2023    4:47 AM 06/13/2023    2:40 AM  CBC  WBC 4.0 - 10.5 K/uL 10.3  13.3  14.0   Hemoglobin 12.0 - 15.0 g/dL 9.7  9.7  9.9   Hematocrit 36.0 - 46.0 % 30.0  30.3  30.3   Platelets 150 - 400 K/uL 160  149  127     ABG    Component Value Date/Time   PHART 7.51 (H) 06/14/2023 2047   PCO2ART 35 06/14/2023 2047   PO2ART 128 (H) 06/14/2023 2047   HCO3 27.8 06/14/2023 2047   TCO2 20 (L) 06/12/2023 0530   ACIDBASEDEF 8.0 (H) 06/12/2023 0530   O2SAT 97.6 06/14/2023 2047    CBG (last 3)  Recent Labs    06/15/23 2340 06/16/23 0344 06/16/23 0822  GLUCAP 199* 153* 152*    Signature:  Yolanda [name], Yolanda [name] [place] Pul[facility]/Critical Care Pager - [phone] or [phone] 06/16/2023, 9:03 AM

## 2023-06-16 NOTE — Progress Notes (Signed)
eLink Physician-Brief Progress Note Patient Name: Yolanda [name] DOB: 1933-07-04 MRN: [id number]   Date of Service  06/16/2023  HPI/Events of Note  Issues with delirium (not new). RN notes she was on precedex till around midnight and then was stopped. Now restless and agitated again. Prior order had expired. Was given haldol last 8/23 but then had some prolonged Qtc so that was discontinued too. Do not see a recent EKG. Restless on camera.   eICU Interventions  Resume precedex. HR is in 80s  Get a 12 lead EKG when she calms down to document Qtc so that haldol can be added later.  D/w RN      Intervention Category Major Interventions: Delirium, psychosis, severe agitation - evaluation and management  Oretha Milch 06/16/2023, 6:16 AM

## 2023-06-16 NOTE — Progress Notes (Signed)
Speech Language Pathology Treatment: Dysphagia  Patient Details Name: Yolanda [name] MRN: [id number] DOB: 03/21/1933 Today's Date: 06/16/2023 Time: 4098-1191 SLP Time Calculation (min) (ACUTE ONLY): 15 min  Assessment / Plan / Recommendation Clinical Impression  Patient seen by SLP for skilled treatment focused on dysphagia goals. Her daughter was in the room and provided basic level interpreting. Prior to giving PO's, SLP and RN repositioned patient in bed. Patient moving around in bed and c/o headache. She was receptive to PO's of ice chips and water. She was resistant to SLP feeding her and so with cueing from SLP and daughter, patient able to hold cup of water and drink via straw sips. Initially, she attempted to hand cup to her right hand but after several attempts, she did cease this action. With straw sips of water, patient with suspected delayed swallow initiation, however no overt s/s aspiration. Patient with dry sounding throat clearing at times. SLP discussed plan with patient and daughter regarding PO's with recommendation to continue with water, ice but that she was not ready for other PO's. Patient telling SLP  that she was not hungry and did not want anything to eat. SLP will continue to follow for PO readiness, with potential need for objective swallow study.    HPI HPI: 89 year old female with history of mild asthma and HTN presents 06/10/2023 to the ED with acute onset of severe headache.  CT scan demonstrates evidence of a diffuse Grade 3 subarachnoid hemorrhage more prominent in her left sylvian fissure. 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 Pt was intubated 8/21-8/23.  Pt completed a BSE on 8/20, but was consulted for re-evaluation secondary to acute respiratory distress and intubation.      SLP Plan  Continue with current plan of care      Recommendations for follow up therapy are one  component of a multi-disciplinary discharge planning process, led by the attending physician.  Recommendations may be updated based on patient status, additional functional criteria and insurance authorization.    Recommendations  Diet recommendations: NPO Medication Administration: Via alternative means Compensations: Slow rate;Small sips/bites;Minimize environmental distractions                  Oral care QID;Oral care prior to ice chip/H20     Dysphagia, oropharyngeal phase (R13.12)     Continue with current plan of care     Angela Nevin, MA, CCC-SLP Speech Therapy

## 2023-06-16 NOTE — Plan of Care (Signed)
  Problem: Self-Care: Goal: Ability to participate in self-care as condition permits will improve Outcome: Progressing Goal: Ability to communicate needs accurately will improve Outcome: Progressing   Problem: Nutrition: Goal: Risk of aspiration will decrease Outcome: Progressing Goal: Dietary intake will improve Outcome: Progressing   Problem: Education: Goal: Knowledge of disease or condition will improve Outcome: Not Progressing Goal: Knowledge of secondary prevention will improve (MUST DOCUMENT ALL) Outcome: Not Progressing Goal: Knowledge of patient specific risk factors will improve Loraine Leriche N/A or DELETE if not current risk factor) Outcome: Not Progressing   Problem: Spontaneous Subarachnoid Hemorrhage Tissue Perfusion: Goal: Complications of Spontaneous Subarachnoid Hemorrhage will be minimized Outcome: Not Progressing   Problem: Coping: Goal: Will verbalize positive feelings about self Outcome: Not Progressing Goal: Will identify appropriate support needs Outcome: Not Progressing   Problem: Health Behavior/Discharge Planning: Goal: Ability to manage health-related needs will improve Outcome: Not Progressing Goal: Goals will be collaboratively established with patient/family Outcome: Not Progressing   Problem: Self-Care: Goal: Verbalization of feelings and concerns over difficulty with self-care will improve Outcome: Not Progressing   Problem: Safety: Goal: Non-violent Restraint(s) Outcome: Not Progressing

## 2023-06-17 ENCOUNTER — Inpatient Hospital Stay (HOSPITAL_COMMUNITY): Payer: Medicare Other

## 2023-06-17 DIAGNOSIS — I5033 Acute on chronic diastolic (congestive) heart failure: Secondary | ICD-10-CM

## 2023-06-17 DIAGNOSIS — J4540 Moderate persistent asthma, uncomplicated: Secondary | ICD-10-CM | POA: Diagnosis not present

## 2023-06-17 DIAGNOSIS — I609 Nontraumatic subarachnoid hemorrhage, unspecified: Secondary | ICD-10-CM | POA: Diagnosis not present

## 2023-06-17 DIAGNOSIS — R410 Disorientation, unspecified: Secondary | ICD-10-CM | POA: Diagnosis not present

## 2023-06-17 LAB — CBC
HCT: 33.1 % — ABNORMAL LOW (ref 36.0–46.0)
Hemoglobin: 10.2 g/dL — ABNORMAL LOW (ref 12.0–15.0)
MCH: 28.2 pg (ref 26.0–34.0)
MCHC: 30.8 g/dL (ref 30.0–36.0)
MCV: 91.4 fL (ref 80.0–100.0)
Platelets: 209 10*3/uL (ref 150–400)
RBC: 3.62 MIL/uL — ABNORMAL LOW (ref 3.87–5.11)
RDW: 14.1 % (ref 11.5–15.5)
WBC: 11.5 10*3/uL — ABNORMAL HIGH (ref 4.0–10.5)
nRBC: 0 % (ref 0.0–0.2)

## 2023-06-17 LAB — GLUCOSE, CAPILLARY
Glucose-Capillary: 129 mg/dL — ABNORMAL HIGH (ref 70–99)
Glucose-Capillary: 147 mg/dL — ABNORMAL HIGH (ref 70–99)
Glucose-Capillary: 149 mg/dL — ABNORMAL HIGH (ref 70–99)
Glucose-Capillary: 156 mg/dL — ABNORMAL HIGH (ref 70–99)
Glucose-Capillary: 174 mg/dL — ABNORMAL HIGH (ref 70–99)
Glucose-Capillary: 179 mg/dL — ABNORMAL HIGH (ref 70–99)
Glucose-Capillary: 93 mg/dL (ref 70–99)

## 2023-06-17 LAB — BASIC METABOLIC PANEL
Anion gap: 12 (ref 5–15)
BUN: 23 mg/dL (ref 8–23)
CO2: 22 mmol/L (ref 22–32)
Calcium: 8.2 mg/dL — ABNORMAL LOW (ref 8.9–10.3)
Chloride: 108 mmol/L (ref 98–111)
Creatinine, Ser: 0.58 mg/dL (ref 0.44–1.00)
GFR, Estimated: >60 mL/min (ref >60)
Glucose, Bld: 126 mg/dL — ABNORMAL HIGH (ref 70–99)
Potassium: 3.7 mmol/L (ref 3.5–5.1)
Sodium: 142 mmol/L (ref 135–145)

## 2023-06-17 MED ORDER — FUROSEMIDE 10 MG/ML IJ SOLN
40.0000 mg | Freq: Once | INTRAMUSCULAR | Status: AC
  Administered 2023-06-17: 40 mg via INTRAVENOUS
  Filled 2023-06-17: qty 4

## 2023-06-17 MED ORDER — ARFORMOTEROL TARTRATE 15 MCG/2ML IN NEBU
15.0000 ug | INHALATION_SOLUTION | Freq: Two times a day (BID) | RESPIRATORY_TRACT | Status: DC
  Administered 2023-06-17 – 2023-06-27 (×19): 15 ug via RESPIRATORY_TRACT
  Filled 2023-06-17 (×20): qty 2

## 2023-06-17 MED ORDER — BUDESONIDE 0.5 MG/2ML IN SUSP
0.5000 mg | Freq: Two times a day (BID) | RESPIRATORY_TRACT | Status: DC
  Administered 2023-06-17 – 2023-06-27 (×19): 0.5 mg via RESPIRATORY_TRACT
  Filled 2023-06-17 (×20): qty 2

## 2023-06-17 NOTE — Progress Notes (Signed)
NAMEDanayah [name], MRN:  [id number], DOB:  1932/12/20, LOS: 7 ADMISSION DATE:  06/10/2023, CONSULTATION DATE:  06/10/2023 REFERRING MD:  Ivan Croft, CHIEF COMPLAINT:  Headache, vomiting  History of Present Illness:  89 yo [place] female presented to ER with sudden onset of severe headache, nausea and vomiting.  Associated with Rt side weakness, photophobia and facial droop.  CT head showed grade 3 SAH, and CTA showed Lt MCA aneurysm, Rt MCA aneurysm, and Rt PCA aneurysm.  Neurosurgery consulted and PCCM asked to admit to ICU.  Pertinent  Medical History  Dementia, Asthma, Arthritis, HTN  Significant Hospital Events: Including procedures, antibiotic start and stop dates in addition to other pertinent events   06/10/2023 [facility] 8/20 intubated due to worsening AMS and requiring levo for sbp goal 130-150 8/21 weaned off levo 8/23 extubate, stridor >> racemic epi/decadron/bipap 8/24 agitated >> transiently on precedex 8/25 back on precedex  Interim History / Subjective:  On and off precedex.  Back on this morning.  Objective   Blood pressure (!) 109/52, pulse (!) 57, temperature 99.1 F (37.3 C), temperature source Axillary, resp. rate 16, height 4\' 8"  (1.422 m), weight 63.2 kg, SpO2 95%.        Intake/Output Summary (Last 24 hours) at 06/17/2023 0759 Last data filed at 06/17/2023 1660 Gross per 24 hour  Intake 2922.24 ml  Output 2100 ml  Net 822.24 ml   Filed Weights   06/11/23 0000 06/14/23 0500 06/17/23 0500  Weight: 56.2 kg 59.7 kg 63.2 kg    Examination:  General - sedated Eyes - pupils reactive ENT - no sinus tenderness, no stridor Cardiac - regular rate/rhythm, no murmur Chest - b/l rhonchi Abdomen - soft, non tender, + bowel sounds Extremities - 1+ edema Skin - no rashes Neuro - moves extremities, follows commands Psych - normal mood and behavior  Resolved Hospital Problem list   Hypotension from sedation, Acute pulmonary edema, Post-extubation  stridor  Assessment & Plan:   Grade 3 SAH 2nd to MCA aneurysm rupture. - neurosurgery following - f/u TCD - continue nimotop - keppra for seizure prophylaxis - prn analgesia for pain control  Acute delirium. - wean precedex to keep RASS 0  - continue zoloft, trazodone, ramelteon - reorient as able  Acute on chronic HFpEF. Prolonged QTc. - Echo 8/21 >> EF 70 to 75%, grade 1 DD - lasix 40 mg IV x one on 8/26 - f/u CXR  Asthma. - pulmicort, brovana - prn albuterol  Steroid induced hyperglycemia. - improved now that she has completed course of decadron - SSI  Anemia of critical illness. - f/u CBC intermittently  Best Practice (right click and "Reselect all SmartList Selections" daily)   Diet/type: tubefeeds DVT prophylaxis: SCD GI prophylaxis: PPI Lines: CVL Foley:  Yes, and it is still needed Code Status:  full code Last date of multidisciplinary goals of care discussion [updated family at bedside on 8/26]  Labs       Latest Ref Rng & Units 06/16/2023    3:48 AM 06/15/2023    4:45 AM 06/14/2023    4:47 AM  CMP  Glucose 70 - 99 mg/dL 630  160  109   BUN 8 - 23 mg/dL 25  23  17    Creatinine 0.44 - 1.00 mg/dL 3.23  5.57  3.22   Sodium 135 - 145 mmol/L 142  144  140   Potassium 3.5 - 5.1 mmol/L 3.5  3.5  3.5   Chloride 98 - 111 mmol/L  113  103  105   CO2 22 - 32 mmol/L 24  25  24    Calcium 8.9 - 10.3 mg/dL 8.4  8.2  7.9   Total Protein 6.5 - 8.1 g/dL   6.1   Total Bilirubin 0.3 - 1.2 mg/dL   1.3   Alkaline Phos 38 - 126 U/L   53   AST 15 - 41 U/L   15   ALT 0 - 44 U/L   33        Latest Ref Rng & Units 06/15/2023    4:45 AM 06/14/2023    4:47 AM 06/13/2023    2:40 AM  CBC  WBC 4.0 - 10.5 K/uL 10.3  13.3  14.0   Hemoglobin 12.0 - 15.0 g/dL 9.7  9.7  9.9   Hematocrit 36.0 - 46.0 % 30.0  30.3  30.3   Platelets 150 - 400 K/uL 160  149  127     ABG    Component Value Date/Time   PHART 7.51 (H) 06/14/2023 2047   PCO2ART 35 06/14/2023 2047   PO2ART 128  (H) 06/14/2023 2047   HCO3 27.8 06/14/2023 2047   TCO2 20 (L) 06/12/2023 0530   ACIDBASEDEF 8.0 (H) 06/12/2023 0530   O2SAT 97.6 06/14/2023 2047    CBG (last 3)  Recent Labs    06/16/23 2348 06/17/23 0353 06/17/23 0739  GLUCAP 174* 93 156*    Signature:  Coralyn Helling, MD [place] [facility] Pager - [phone] or [phone] 06/17/2023, 7:59 AM

## 2023-06-17 NOTE — Progress Notes (Signed)
Speech Language Pathology Treatment: Dysphagia  Patient Details Name: Yolanda [name] MRN: [id number] DOB: Mar 17, 1933 Today's Date: 06/17/2023 Time: 4259-5638 SLP Time Calculation (min) (ACUTE ONLY): 15 min  Assessment / Plan / Recommendation Clinical Impression  Pt demonstrates ongoing difficulty sitting upright; pt evaluated sidelying in a recliner. She was attentive, took sips from a straw with visual cues, no signs of aspiration with consecutive sips. There is mild right labial weakness but no overt oral residue or spillage. When given teaspoons of puree pt had brief oral hold, then manipulated and swallowed without residue. Suggested allowing family to bring pudding type texture from home, like rice pudding. Told granddaughter she could have the type of food her family would feed a baby without teeth. Will also initiate a puree/thin diet in the hospital and f/u for tolerance. No great effort on speech/language this session. Will f/u.   HPI HPI: 89 year old female with history of mild asthma and HTN presents 06/10/2023 to the ED with acute onset of severe headache.  CT scan demonstrates evidence of a diffuse Grade 3 subarachnoid hemorrhage more prominent in her left sylvian fissure. 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 Pt was intubated 8/21-8/23.  Pt completed a BSE on 8/20, but was consulted for re-evaluation secondary to acute respiratory distress and intubation.      SLP Plan  Continue with current plan of care      Recommendations for follow up therapy are one component of a multi-disciplinary discharge planning process, led by the attending physician.  Recommendations may be updated based on patient status, additional functional criteria and insurance authorization.    Recommendations  Diet recommendations: Dysphagia 1 (puree);Thin liquid Liquids provided via: Straw Medication Administration:  Via alternative means Supervision: Full supervision/cueing for compensatory strategies Compensations: Slow rate;Small sips/bites;Minimize environmental distractions Postural Changes and/or Swallow Maneuvers:  (elevated side lying ok)                      Frequent or constant Supervision/Assistance Dysphagia, oropharyngeal phase (R13.12)     Continue with current plan of care     Leean Amezcua, [name]  06/17/2023, 1:55 PM

## 2023-06-17 NOTE — Progress Notes (Signed)
  NEUROSURGERY PROGRESS NOTE   No issues overnight.  EXAM:  BP (!) 109/52   Pulse 66   Temp 98.1 F (36.7 C) (Axillary)   Resp 18   Ht 4\' 8"  (1.422 m)   Wt 63.2 kg   SpO2 95%   BMI 31.24 kg/m   Awake, alert On low-dose sedation, Localizes LUE, moves LLE spontaneously No voluntary movement RUE Right groin site soft  TCD: Date POD PCO2 HCT BP   MCA ACA PCA OPHT SIPH VERT Basilar  8/21 SW         Right  Left   57  71   47 37      37 21        56  20   24  13    *  *   *        8/23 SW         Right  Left    74   32    48   70    24   35    36   24    26   47    *   *    *         IMPRESSION:  89 y.o. female SAH d#7 s/p subtotal coil embolization of large left MCA aneurysm, neurologically stable with RUE monoparesis. Pulmonary status appears stable. Nonetheless, I suspect overall prognosis for good recovery for an 89yo patient presenting as grade 3 SAH is poor.  No clinical spasm  PLAN: - cont supportive care per PCCM. - Maintain normotension.  - Cont Nimotop - TCD today  I reviewed the situation and the above with family at bedside. All questions answered.   Lisbeth Renshaw, MD [facility]

## 2023-06-17 NOTE — Progress Notes (Signed)
Nutrition Follow-up  DOCUMENTATION CODES:   Not applicable  INTERVENTION:  -Continue Osmolite 1.2 @ 45 ml/hr (1080 ml per day) Prosource TF20 60 ml daily Provides 1376 kcal, 80 gm protein, 875 ml free water daily banatrol BID  - Continue Dys 1 diet, thin liquids.   NUTRITION DIAGNOSIS:   Inadequate oral intake related to inability to eat as evidenced by NPO status.  GOAL:   Patient will meet greater than or equal to 90% of their needs - Met, with tube feed.   MONITOR:   TF tolerance  REASON FOR ASSESSMENT:   Consult Enteral/tube feeding initiation and management  ASSESSMENT:   Pt with PMH of dementia admitted with [facility] s/p L MCA aneurysm coil embolization.  Meds reviewed: sliding scale insulin. Labs reviewed: WDL.   Pt is currently Oriented x1 and has been extubated to nasal cannula since last assessment. Pt remains with Cortrak is in place. Diet just got advanced today to Dys 1 diet today. RN reports that the family is planning to bring in pureed foods from home. TF are still running as ordered. RN reports that the pt is tolerating well with no issues. RD will continue to monitor PO intakes and TF tolerance.   Diet Order:   Diet Order             DIET - DYS 1 Fluid consistency: Thin  Diet effective now                   EDUCATION NEEDS:   No education needs have been identified at this time  Skin:  Skin Assessment: Reviewed RN Assessment  Last BM:  8/25 - type 7  Height:   Ht Readings from Last 1 Encounters:  06/10/23 4\' 8"  (1.422 m)    Weight:   Wt Readings from Last 1 Encounters:  06/17/23 63.2 kg    Ideal Body Weight:     BMI:  Body mass index is 31.24 kg/m.  Estimated Nutritional Needs:   Kcal:  1200-1400  Protein:  70-85 grams  Fluid:  > 1.5 L/day  Bethann Humble, RD, LDN, CNSC.

## 2023-06-17 NOTE — Progress Notes (Signed)
Physical Therapy Treatment Patient Details Name: Yolanda [name] MRN: [id number] DOB: June 02, 1933 Today's Date: 06/17/2023   History of Present Illness Pt is an 89 y.o. female who presented 06/10/23 with acute onset of severe headache and R-sided weakness. CT scan demonstrates evidence of a diffuse subarachnoid hemorrhage more prominent in her left sylvian fissure. 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 S/p coil embolization of L MCA aneurysm 8/19. Intubated 8/20. Cortrak placed 8/21. PMH: dementia, asthma, OA, HTN    PT Comments  The pt was agreeable to session, video interpreter used for Nepali language. The pt was lethargic upon arrival, but did answer interpreter through session and attempt to follow commands intermittently. Better command following with whole-body movements ("stand up") than isolated movements ("lift your left foot") at this time. Pt asking for restraints to be loosened, poor insight to deficits and safety. Pt needing maxA of 2 to rise to standing, assist to position bilateral feet and block R knee as well as to maintain upright once standing. Pt then assisted to reposition in chair, needing totalA of 2 due to poor command following. Mobility limited by headache, VSS. Will continue to progress mobility and balance as able, continue to recommend intensive therapies when medically appropriate.     If plan is discharge home, recommend the following: Two people to help with walking and/or transfers;Two people to help with bathing/dressing/bathroom;Assistance with cooking/housework;Direct supervision/assist for medications management;Direct supervision/assist for financial management;Assistance with feeding;Assist for transportation   Can travel by private vehicle        Equipment Recommendations  Other (comment) (defer to next venue of care)    Recommendations for Other Services        Precautions / Restrictions Precautions Precautions: Fall Precaution Comments: bil wrist restraints and L mitten; SBP goal <150; cortrak Restrictions Weight Bearing Restrictions: No     Mobility  Bed Mobility Overal bed mobility: Needs Assistance             General bed mobility comments: pt OOB in recliner at start and end of session    Transfers Overall transfer level: Needs assistance Equipment used: 2 person hand held assist Transfers: Sit to/from Stand Sit to Stand: Max assist, +2 safety/equipment, +2 physical assistance           General transfer comment: maxA of 2 with assist to block R knee and position bilateral feet prior to stand. assist at hips to extend. completed x2    Ambulation/Gait               General Gait Details: deferred due to difficulty maintaining standing     Modified Rankin (Stroke Patients Only) Modified Rankin (Stroke Patients Only) Pre-Morbid Rankin Score: Slight disability Modified Rankin: Severe disability     Balance Overall balance assessment: Needs assistance Sitting-balance support: Single extremity supported, Feet supported Sitting balance-Leahy Scale: Zero Sitting balance - Comments: Pt with strong retropulsion sitting EOB and pushing self to R with L UE, total assist for static sitting balance and blocking of her knees to ensure she did not slide off EOB Postural control: Posterior lean, Right lateral lean Standing balance support: Bilateral upper extremity supported, During functional activity Standing balance-Leahy Scale: Zero Standing balance comment: dependent on maxA of 2 and blocking R knee                            Cognition Arousal:  Lethargic (pt moaning and lethargic but did improve with mobility) Behavior During Therapy: Restless, Impulsive Overall Cognitive Status: Impaired/Different from baseline Area of Impairment: Following commands, Safety/judgement, Awareness, Problem solving,  Attention                   Current Attention Level: Focused   Following Commands: Follows one step commands inconsistently, Follows one step commands with increased time Safety/Judgement: Decreased awareness of safety, Decreased awareness of deficits Awareness: Intellectual Problem Solving: Slow processing, Difficulty sequencing, Requires verbal cues, Requires tactile cues, Decreased initiation General Comments: pt in R sidelying in recliner, moaning/mumbling to answer interpreter, but following commands intermittently. better command following with whole body movements (stand up) compared to isoalted (lift your left foot). pt able to communicate with interpreter with delay. uses simple-one-word answers mostly.        Exercises      General Comments General comments (skin integrity, edema, etc.): VSS on 10L O2      Pertinent Vitals/Pain Pain Assessment Pain Assessment: Faces Faces Pain Scale: Hurts little more Pain Location: generalized Pain Descriptors / Indicators: Discomfort, Grimacing, Guarding, Headache Pain Intervention(s): Limited activity within patient's tolerance, Monitored during session, Repositioned     PT Goals (current goals can now be found in the care plan section) Acute Rehab PT Goals Patient Stated Goal: did not state; family hopeful for pt to improve PT Goal Formulation: With patient/family Time For Goal Achievement: 06/27/23 Potential to Achieve Goals: Fair Progress towards PT goals: Progressing toward goals    Frequency    Min 1X/week      PT Plan      Co-evaluation PT/OT/SLP Co-Evaluation/Treatment: Yes Reason for Co-Treatment: Necessary to address cognition/behavior during functional activity;For patient/therapist safety;To address functional/ADL transfers PT goals addressed during session: Mobility/safety with mobility;Balance        AM-PAC PT "6 Clicks" Mobility   Outcome Measure  Help needed turning from your back to your  side while in a flat bed without using bedrails?: Total Help needed moving from lying on your back to sitting on the side of a flat bed without using bedrails?: Total Help needed moving to and from a bed to a chair (including a wheelchair)?: Total Help needed standing up from a chair using your arms (e.g., wheelchair or bedside chair)?: Total Help needed to walk in hospital room?: Total Help needed climbing 3-5 steps with a railing? : Total 6 Click Score: 6    End of Session Equipment Utilized During Treatment: Oxygen Activity Tolerance: Patient tolerated treatment well Patient left: with family/visitor present;with restraints reapplied;in chair;with call bell/phone within reach;with chair alarm set Nurse Communication: Mobility status PT Visit Diagnosis: Muscle weakness (generalized) (M62.81);Difficulty in walking, not elsewhere classified (R26.2);Other symptoms and signs involving the nervous system (R29.898)     Time: 1610-9604 PT Time Calculation (min) (ACUTE ONLY): 36 min  Charges:    $Therapeutic Activity: 8-22 mins PT General Charges $$ ACUTE PT VISIT: 1 Visit                     Vickki Muff, PT, DPT   Acute Rehabilitation Department Office [phone] Secure Chat Communication Preferred   Ronnie Derby 06/17/2023, 1:31 PM

## 2023-06-17 NOTE — Progress Notes (Signed)
Occupational Therapy Treatment Patient Details Name: Yolanda [name] MRN: [id number] DOB: 11-Jun-1933 Today's Date: 06/17/2023   History of present illness Pt is an 89 y.o. female who presented 06/10/23 with acute onset of severe headache and R-sided weakness. CT scan demonstrates evidence of a diffuse subarachnoid hemorrhage more prominent in her left sylvian fissure. 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 S/p coil embolization of L MCA aneurysm 8/19. Intubated 8/20. Cortrak placed 8/21. PMH: dementia, asthma, OA, HTN   OT comments  Patient with incremental progress towards goals. Nepali interpreter used throughout with daughter and granddaughter present. Patient remains disoriented, confused, and poor insight into deficits. Patient also with waxing and waning participation, however hard to determine if this is more cognition or understanding the interpreter. Patient with need for max A of 2 to stand from EOB with poor activity tolerance. Patient with increased headache in standing, and total A of 2 to reposition in chair (no command following after two standing trials). OT will continue to follow, however may benefit from decreased level of rehab due to current level and overall progression.       If plan is discharge home, recommend the following:  A little help with walking and/or transfers;Two people to help with walking and/or transfers;Two people to help with bathing/dressing/bathroom;Direct supervision/assist for medications management;Assistance with feeding;Assist for transportation;Assistance with cooking/housework;Direct supervision/assist for financial management;Supervision due to cognitive status   Equipment Recommendations  Other (comment) (Defer to next venue)    Recommendations for Other Services      Precautions / Restrictions Precautions Precautions: Fall Precaution Comments: bil wrist  restraints and L mitten; SBP goal <150; cortrak Restrictions Weight Bearing Restrictions: No       Mobility Bed Mobility Overal bed mobility: Needs Assistance             General bed mobility comments: pt OOB in recliner at start and end of session    Transfers Overall transfer level: Needs assistance Equipment used: 2 person hand held assist Transfers: Sit to/from Stand Sit to Stand: Max assist, +2 safety/equipment, +2 physical assistance           General transfer comment: maxA of 2 with assist to block R knee and position bilateral feet prior to stand. assist at hips to extend. completed x2     Balance Overall balance assessment: Needs assistance Sitting-balance support: Single extremity supported, Feet supported Sitting balance-Leahy Scale: Zero Sitting balance - Comments: Pt with strong retropulsion sitting EOB and pushing self to R with L UE, total assist for static sitting balance and blocking of her knees to ensure she did not slide off EOB Postural control: Posterior lean, Right lateral lean Standing balance support: Bilateral upper extremity supported, During functional activity Standing balance-Leahy Scale: Zero Standing balance comment: dependent on maxA of 2 and blocking R knee                           ADL either performed or assessed with clinical judgement   ADL Overall ADL's : Needs assistance/impaired Eating/Feeding: Minimal assistance;Sitting   Grooming: Minimal assistance;Sitting;Wash/dry hands;Wash/dry face               Lower Body Dressing: Total assistance;Sitting/lateral leans   Toilet Transfer: Maximal assistance;+2 for physical assistance;+2 for safety/equipment Toilet Transfer Details (indicate cue type and reason): simulated with sit<>stands         Functional mobility  during ADLs: Maximal assistance;+2 for physical assistance;+2 for safety/equipment;Cueing for sequencing;Cueing for safety General ADL Comments:  Patient continues to require significant assist for all aspects of care    Extremity/Trunk Assessment              Vision       Perception     Praxis      Cognition Arousal: Lethargic (pt moaning and lethargic but did improve with mobility) Behavior During Therapy: Restless, Impulsive Overall Cognitive Status: Impaired/Different from baseline Area of Impairment: Following commands, Safety/judgement, Awareness, Problem solving, Attention                   Current Attention Level: Focused   Following Commands: Follows one step commands inconsistently, Follows one step commands with increased time Safety/Judgement: Decreased awareness of safety, Decreased awareness of deficits Awareness: Intellectual Problem Solving: Slow processing, Difficulty sequencing, Requires verbal cues, Requires tactile cues, Decreased initiation General Comments: pt in R sidelying in recliner, moaning/mumbling to answer interpreter, but following commands intermittently. better command following with whole body movements (stand up) compared to isoalted (lift your left foot). pt able to communicate with interpreter with delay. uses simple-one-word answers mostly.        Exercises      Shoulder Instructions       General Comments VSS on 10L O2    Pertinent Vitals/ Pain       Pain Assessment Pain Assessment: Faces Faces Pain Scale: Hurts little more Pain Location: generalized Pain Descriptors / Indicators: Discomfort, Grimacing, Guarding, Headache Pain Intervention(s): Monitored during session, Limited activity within patient's tolerance, Repositioned  Home Living                                          Prior Functioning/Environment              Frequency  Min 1X/week        Progress Toward Goals  OT Goals(current goals can now be found in the care plan section)  Progress towards OT goals: Progressing toward goals  Acute Rehab OT Goals Patient  Stated Goal: unable to state OT Goal Formulation: Patient unable to participate in goal setting Time For Goal Achievement: 06/27/23 Potential to Achieve Goals: Fair  Plan      Co-evaluation      Reason for Co-Treatment: Necessary to address cognition/behavior during functional activity;For patient/therapist safety;To address functional/ADL transfers PT goals addressed during session: Mobility/safety with mobility;Balance        AM-PAC OT "6 Clicks" Daily Activity     Outcome Measure   Help from another person eating meals?: A Little Help from another person taking care of personal grooming?: A Little Help from another person toileting, which includes using toliet, bedpan, or urinal?: Total Help from another person bathing (including washing, rinsing, drying)?: Total Help from another person to put on and taking off regular upper body clothing?: A Lot Help from another person to put on and taking off regular lower body clothing?: Total 6 Click Score: 11    End of Session    OT Visit Diagnosis: Unsteadiness on feet (R26.81);Other abnormalities of gait and mobility (R26.89);Muscle weakness (generalized) (M62.81);Hemiplegia and hemiparesis;Other symptoms and signs involving cognitive function;Cognitive communication deficit (R41.841) Symptoms and signs involving cognitive functions: Nontraumatic SAH Hemiplegia - Right/Left: Right Hemiplegia - dominant/non-dominant: Dominant Hemiplegia - caused by: Nontraumatic SAH   Activity Tolerance  Patient Left     Nurse Communication          Time: 8119-1478 OT Time Calculation (min): 36 min  Charges: OT General Charges $OT Visit: 1 Visit OT Treatments $Self Care/Home Management : 8-22 mins  Pollyann Glen E. Baldomero Mirarchi, OTR/L Acute Rehabilitation Services [phone]   Cherlyn Cushing 06/17/2023, 3:52 PM

## 2023-06-17 NOTE — TOC Initial Note (Signed)
Transition of Care [facility]) - Initial/Assessment Note    Patient Details  Name: Yolanda [name] MRN: [id number] Date of Birth: [birth year]  Transition of Care [facility]) CM/SW Contact:    Mearl Latin, LCSW Phone Number: 06/17/2023, 5:42 PM  Clinical Narrative:                 TOC following to determine discharge venue. Patient was admitted from home with support of sons.     Barriers to Discharge: Continued Medical Work up   Patient Goals and CMS Choice            Expected Discharge Plan and Services In-house Referral: Clinical Social Work     Living arrangements for the past 2 months: Single Family Home                                      Prior Living Arrangements/Services Living arrangements for the past 2 months: Single Family Home Lives with:: Adult Children Patient language and need for interpreter reviewed:: Yes (Speaks Nepali)        Need for Family Participation in Patient Care: Yes (Comment) Care giver support system in place?: Yes (comment)   Criminal Activity/Legal Involvement Pertinent to Current Situation/Hospitalization: No - Comment as needed  Activities of Daily Living      Permission Sought/Granted                  Emotional Assessment Appearance:: Appears stated age Attitude/Demeanor/Rapport: Unable to Assess Affect (typically observed): Unable to Assess Orientation: : Oriented to Self, Oriented to Place Alcohol / Substance Use: Not Applicable Psych Involvement: No (comment)  Admission diagnosis:  Subarachnoid hemorrhage (HCC) [I60.9] SAH (subarachnoid hemorrhage) (HCC) [I60.9] Patient Active Problem List   Diagnosis Date Noted   Acute respiratory failure with hypoxia (HCC) 06/11/2023   Subarachnoid hemorrhage (HCC) 06/10/2023   Dementia (HCC) 06/29/2022   Closed fracture of left proximal humerus 03/27/2021   Closed fracture of lower end of left radius with routine healing 10/31/2020   Dry cough 04/04/2019   Mild intermittent  asthma without complication 04/04/2019   OSTEOARTHRITIS 08/04/2010   PCP:  Pcp, No Pharmacy:   [id number] - [place] ([place]), [place] - [address]  ([place]) [place] [address] Phone: [phone] Fax: [phone]  [id number] [place], [place] - [address] AT [facility] OF [address] & [facility] [facility] [address] [facility] [address] [place] [address] Phone: [phone] Fax: [phone]  [id number] - [place], [place] - [address] AT [facility] [address] [place] [address] Phone: [phone] Fax: [phone]     Social Determinants of Health (SDOH) Social History: SDOH Screenings   Tobacco Use: Unknown (06/10/2023)   SDOH Interventions:     Readmission Risk Interventions     No data to display

## 2023-06-18 ENCOUNTER — Inpatient Hospital Stay (HOSPITAL_COMMUNITY): Payer: Medicare Other

## 2023-06-18 DIAGNOSIS — I609 Nontraumatic subarachnoid hemorrhage, unspecified: Secondary | ICD-10-CM | POA: Diagnosis not present

## 2023-06-18 LAB — BASIC METABOLIC PANEL
Anion gap: 12 (ref 5–15)
BUN: 19 mg/dL (ref 8–23)
CO2: 28 mmol/L (ref 22–32)
Calcium: 8.6 mg/dL — ABNORMAL LOW (ref 8.9–10.3)
Chloride: 100 mmol/L (ref 98–111)
Creatinine, Ser: 0.58 mg/dL (ref 0.44–1.00)
GFR, Estimated: >60 mL/min (ref >60)
Glucose, Bld: 133 mg/dL — ABNORMAL HIGH (ref 70–99)
Potassium: 3.6 mmol/L (ref 3.5–5.1)
Sodium: 140 mmol/L (ref 135–145)

## 2023-06-18 LAB — GLUCOSE, CAPILLARY
Glucose-Capillary: 125 mg/dL — ABNORMAL HIGH (ref 70–99)
Glucose-Capillary: 142 mg/dL — ABNORMAL HIGH (ref 70–99)
Glucose-Capillary: 170 mg/dL — ABNORMAL HIGH (ref 70–99)
Glucose-Capillary: 181 mg/dL — ABNORMAL HIGH (ref 70–99)
Glucose-Capillary: 183 mg/dL — ABNORMAL HIGH (ref 70–99)
Glucose-Capillary: 193 mg/dL — ABNORMAL HIGH (ref 70–99)

## 2023-06-18 LAB — CBC
HCT: 36.1 % (ref 36.0–46.0)
Hemoglobin: 11.9 g/dL — ABNORMAL LOW (ref 12.0–15.0)
MCH: 29.5 pg (ref 26.0–34.0)
MCHC: 33 g/dL (ref 30.0–36.0)
MCV: 89.4 fL (ref 80.0–100.0)
Platelets: 255 10*3/uL (ref 150–400)
RBC: 4.04 MIL/uL (ref 3.87–5.11)
RDW: 13.5 % (ref 11.5–15.5)
WBC: 14.5 10*3/uL — ABNORMAL HIGH (ref 4.0–10.5)
nRBC: 0 % (ref 0.0–0.2)

## 2023-06-18 MED ORDER — POTASSIUM CHLORIDE 20 MEQ PO PACK
40.0000 meq | PACK | Freq: Four times a day (QID) | ORAL | Status: AC
  Administered 2023-06-18 (×2): 40 meq
  Filled 2023-06-18 (×2): qty 2

## 2023-06-18 MED ORDER — BISOPROLOL FUMARATE 5 MG PO TABS
5.0000 mg | ORAL_TABLET | Freq: Every day | ORAL | Status: DC
  Administered 2023-06-18 – 2023-06-19 (×2): 5 mg
  Filled 2023-06-18 (×3): qty 1

## 2023-06-18 MED ORDER — HEPARIN SODIUM (PORCINE) 5000 UNIT/ML IJ SOLN
5000.0000 [IU] | Freq: Three times a day (TID) | INTRAMUSCULAR | Status: DC
  Administered 2023-06-18 – 2023-06-27 (×29): 5000 [IU] via SUBCUTANEOUS
  Filled 2023-06-18 (×29): qty 1

## 2023-06-18 MED ORDER — BISOPROLOL FUMARATE 5 MG PO TABS: 5.0000 mg | ORAL_TABLET | Freq: Every day | ORAL | Status: DC

## 2023-06-18 MED ORDER — FUROSEMIDE 10 MG/ML IJ SOLN
40.0000 mg | Freq: Once | INTRAMUSCULAR | Status: AC
  Administered 2023-06-18: 40 mg via INTRAVENOUS
  Filled 2023-06-18: qty 4

## 2023-06-18 MED ORDER — LABETALOL HCL 5 MG/ML IV SOLN
5.0000 mg | INTRAVENOUS | Status: DC | PRN
  Administered 2023-06-18: 5 mg via INTRAVENOUS
  Filled 2023-06-18: qty 4

## 2023-06-18 NOTE — Progress Notes (Signed)
Speech Language Pathology Treatment: Dysphagia;Cognitive-Linquistic  Patient Details Name: Yolanda [name] MRN: [id number] DOB: May 23, 1933 Today's Date: 06/18/2023 Time: 1100-1120 SLP Time Calculation (min) (ACUTE ONLY): 20 min  Assessment / Plan / Recommendation Clinical Impression  SLP used video interpreter for Nepali, but device did not stay charged even when plugged in prior to and during session. Session focused on oral care, hand over hand assist for basic self care. Pts speech clearer after oral care; intelligible in stating her name, counting to three naming book, spoon and water. Dysarthria evident however. Pt responded in phrase level to son when talking about food. Pt is able to sip water and eat pudding/puree when side lying though she is not hungry. Demonstrated safe feeding methods to son. Will need ongoing interventions as activity level improves.   HPI HPI: 89 year old female with history of mild asthma and HTN presents 06/10/2023 to the ED with acute onset of severe headache.  CT scan demonstrates evidence of a diffuse Grade 3 subarachnoid hemorrhage more prominent in her left sylvian fissure. 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 Pt was intubated 8/21-8/23.  Pt completed a BSE on 8/20, but was consulted for re-evaluation secondary to acute respiratory distress and intubation.      SLP Plan  Continue with current plan of care      Recommendations for follow up therapy are one component of a multi-disciplinary discharge planning process, led by the attending physician.  Recommendations may be updated based on patient status, additional functional criteria and insurance authorization.    Recommendations  Diet recommendations: Dysphagia 1 (puree);Thin liquid Liquids provided via: Cup;Straw Medication Administration: Via alternative means Supervision: Trained caregiver to feed patient;Full  supervision/cueing for compensatory strategies Compensations: Slow rate;Small sips/bites;Minimize environmental distractions                              Continue with current plan of care     Yolanda [name], [name]  06/18/2023, 1:49 PM

## 2023-06-18 NOTE — Progress Notes (Signed)
Physical Therapy Treatment Patient Details Name: Yolanda [name] MRN: [id number] DOB: 01-17-1933 Today's Date: 06/18/2023   History of Present Illness Pt is an 89 y.o. female who presented 06/10/23 with acute onset of severe headache and R-sided weakness. CT scan demonstrates evidence of a diffuse subarachnoid hemorrhage more prominent in her left sylvian fissure. 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 S/p coil embolization of L MCA aneurysm 8/19. Intubated 8/20. Cortrak placed 8/21. PMH: dementia, asthma, OA, HTN    PT Comments  Pt received in bed with family present. Both interpreter computers on unit broken today so session instructions kept very basic and daughter translated. Therapist also used visual and tactile cues. Pt following basic commands and verbalized one word answers to daughter's questions. Pt came to EOB with max A. Pt stood from bed with mod A +2 and was able to take pivot steps to chair with mod A +2, no knee buckling today. However, pt fatigues very quickly and could not tolerate ambulation progression. Patient will benefit from intensive inpatient follow up therapy, >3 hours/day. PT will continue to follow.     If plan is discharge home, recommend the following: Two people to help with walking and/or transfers;Two people to help with bathing/dressing/bathroom;Assistance with cooking/housework;Direct supervision/assist for medications management;Direct supervision/assist for financial management;Assistance with feeding;Assist for transportation   Can travel by private vehicle        Equipment Recommendations  Other (comment) (defer to next venue of care)    Recommendations for Other Services Rehab consult     Precautions / Restrictions Precautions Precautions: Fall Precaution Comments: posey, L mitten,  SBP goal <150; cortrak Restrictions Weight Bearing Restrictions: No      Mobility  Bed Mobility Overal bed mobility: Needs Assistance Bed Mobility: Supine to Sit, Rolling Rolling: Max assist   Supine to sit: Max assist Sit to supine: Total assist, +2 for physical assistance, +2 for safety/equipment, HOB elevated   General bed mobility comments: pt likes to cross LE's in bed, max A for LE's off EOB and elevation of trunk    Transfers Overall transfer level: Needs assistance Equipment used: 2 person hand held assist Transfers: Sit to/from Stand, Bed to chair/wheelchair/BSC Sit to Stand: +2 safety/equipment, +2 physical assistance, Mod assist   Step pivot transfers: Mod assist, +2 physical assistance       General transfer comment: pt without R knee buckle today, able to take pivot steps bed to chair. Has difficulty getting hips to back of chair with stand to sit    Ambulation/Gait               General Gait Details: deferred due to quick fatigue in standing   Stairs             Wheelchair Mobility     Tilt Bed    Modified Rankin (Stroke Patients Only) Modified Rankin (Stroke Patients Only) Pre-Morbid Rankin Score: Slight disability Modified Rankin: Severe disability     Balance Overall balance assessment: Needs assistance Sitting-balance support: Single extremity supported, Feet supported Sitting balance-Leahy Scale: Poor Sitting balance - Comments: pt maintaining sitting with min A, tended to lean fwd today, not pushing with LUE today Postural control: Right lateral lean, Other (comment) (anterior) Standing balance support: Bilateral upper extremity supported, During functional activity Standing balance-Leahy Scale: Poor Standing balance comment: max A needed to maintain standing but pt without knee buckling  Cognition Arousal: Alert Behavior During Therapy: Restless Overall Cognitive Status: Impaired/Different from baseline Area of Impairment: Following commands,  Safety/judgement, Awareness, Problem solving, Attention                   Current Attention Level: Focused   Following Commands: Follows one step commands inconsistently, Follows one step commands with increased time Safety/Judgement: Decreased awareness of safety, Decreased awareness of deficits Awareness: Intellectual Problem Solving: Slow processing, Difficulty sequencing, Requires verbal cues, Requires tactile cues, Decreased initiation General Comments: pt followed simple instructions on daughter's command and with visual and tactile cues        Exercises      General Comments General comments (skin integrity, edema, etc.): VSS, family present. Pt with L lean in chair end of session, pillows used to prop pt in midline      Pertinent Vitals/Pain Pain Assessment Pain Assessment: Faces Faces Pain Scale: Hurts little more Breathing: normal Negative Vocalization: repeated troubled calling out, loud moaning/groaning, crying Facial Expression: facial grimacing Body Language: rigid, fists clenched, knees up, pushing/pulling away, strikes out Consolability: distracted or reassured by voice/touch PAINAD Score: 7 Pain Location: generalized Pain Descriptors / Indicators: Discomfort, Grimacing Pain Intervention(s): Limited activity within patient's tolerance, Monitored during session    Home Living                          Prior Function            PT Goals (current goals can now be found in the care plan section) Acute Rehab PT Goals Patient Stated Goal: did not state; family hopeful for pt to improve PT Goal Formulation: With patient/family Time For Goal Achievement: 06/27/23 Potential to Achieve Goals: Fair Progress towards PT goals: Progressing toward goals    Frequency    Min 1X/week      PT Plan      Co-evaluation PT/OT/SLP Co-Evaluation/Treatment: Yes Reason for Co-Treatment: Necessary to address cognition/behavior during functional  activity;For patient/therapist safety;To address functional/ADL transfers PT goals addressed during session: Mobility/safety with mobility;Balance OT goals addressed during session: ADL's and self-care      AM-PAC PT "6 Clicks" Mobility   Outcome Measure  Help needed turning from your back to your side while in a flat bed without using bedrails?: A Lot Help needed moving from lying on your back to sitting on the side of a flat bed without using bedrails?: A Lot Help needed moving to and from a bed to a chair (including a wheelchair)?: Total Help needed standing up from a chair using your arms (e.g., wheelchair or bedside chair)?: Total Help needed to walk in hospital room?: Total Help needed climbing 3-5 steps with a railing? : Total 6 Click Score: 8    End of Session Equipment Utilized During Treatment: Oxygen;Gait belt Activity Tolerance: Patient tolerated treatment well Patient left: with family/visitor present;in chair;with call bell/phone within reach;with chair alarm set Nurse Communication: Mobility status PT Visit Diagnosis: Muscle weakness (generalized) (M62.81);Difficulty in walking, not elsewhere classified (R26.2);Other symptoms and signs involving the nervous system (R29.898)     Time: 5638-7564 PT Time Calculation (min) (ACUTE ONLY): 34 min  Charges:    $Therapeutic Activity: 23-37 mins PT General Charges $$ ACUTE PT VISIT: 1 Visit                     Lyanne Co, PT  Acute Rehab Services Secure chat preferred Office [phone]    [place] L Zamya Culhane  06/18/2023, 4:56 PM

## 2023-06-18 NOTE — Progress Notes (Deleted)
   Transcranial Doppler   Date POD PCO2 HCT BP   MCA ACA PCA OPHT SIPH VERT Basilar  8/21 SW         Right  Left   57  71   47 37      37 21        56  20   24  13    *  *   *        8/23 SW         Right  Left    74   32    48   70    24   35    36   24    26   47    *   *    *        8/26,RS         Right  Left    *   67    *   -34    *  -44    29   24    *   *    -29   -29                       Right  Left                                                                 Right  Left                                                               Right  Left                                                               Right  Left                                                       MCA = Middle Cerebral Artery      OPHT = Opthalmic Artery     BASILAR = Basilar Artery   ACA = Anterior Cerebral Artery     SIPH = Carotid Siphon PCA = Posterior Cerebral Artery   VERT = Verterbral Artery                    Normal MCA = 62+\-12 ACA = 50+\-12 PCA = 42+\-23     (*) Unable to insonate vessels due to patient frequent movement and position.

## 2023-06-18 NOTE — Progress Notes (Signed)
NAMEChristy [name], MRN:  [id number], DOB:  1933-07-13, LOS: 8 ADMISSION DATE:  06/10/2023, CONSULTATION DATE:  06/10/2023 REFERRING MD:  Ivan Croft, CHIEF COMPLAINT:  Headache, vomiting  History of Present Illness:  89 yo [place] female presented to ER with sudden onset of severe headache, nausea and vomiting.  Associated with Rt side weakness, photophobia and facial droop.  CT head showed grade 3 SAH, and CTA showed Lt MCA aneurysm, Rt MCA aneurysm, and Rt PCA aneurysm.  Neurosurgery consulted and PCCM asked to admit to ICU.  Pertinent  Medical History  Dementia, Asthma, Arthritis, HTN  Significant Hospital Events: Including procedures, antibiotic start and stop dates in addition to other pertinent events   06/10/2023 Admission [facility] 8/20 intubated due to worsening AMS and requiring levo for sbp goal 130-150 8/21 weaned off levo 8/23 extubate, stridor >> racemic epi/decadron/bipap 8/24 agitated >> transiently on precedex 8/25 back on precedex 8/27 off precedex  Interim History / Subjective:  More interactive this morning.  Moving Rt side better.  Down to 1 liter nasal cannula.  Objective   Blood pressure (!) 162/80, pulse 92, temperature 98 F (36.7 C), temperature source Axillary, resp. rate 15, height 4\' 8"  (1.422 m), weight 63.2 kg, SpO2 99%.        Intake/Output Summary (Last 24 hours) at 06/18/2023 0919 Last data filed at 06/18/2023 0700 Gross per 24 hour  Intake 2200.51 ml  Output 2720 ml  Net -519.49 ml   Filed Weights   06/14/23 0500 06/17/23 0500 06/18/23 0500  Weight: 59.7 kg 63.2 kg 63.2 kg    Examination:  General - alert Eyes - pupils reactive ENT - no sinus tenderness, no stridor Cardiac - regular rate/rhythm, no murmur Chest - basilar rales Abdomen - soft, non tender, + bowel sounds Extremities - no cyanosis, clubbing, or edema Skin - no rashes Neuro - follows some commands  Resolved Hospital Problem list   Hypotension from sedation, Post-extubation  stridor  Assessment & Plan:   Grade 3 SAH 2nd to MCA aneurysm rupture. - neurosurgery following - f/u TCD - continue nimotop - keppra for seizure prophylaxis - prn analgesia for pain control  Acute delirium. - off precedex 8/27 - continue zoloft, trazodone, ramelteon - reorient as able  Acute on chronic HFpEF with acute pulmonary edema. Hx of HTN. Prolonged QTc. - Echo 8/21 >> EF 70 to 75%, grade 1 DD - lasix 40 mg IV x one on 8/27 - f/u CXR intermittently - goal normotension  Asthma. - pulmicort, brovana - prn albuterol  Steroid induced hyperglycemia. - improved now that she has completed course of decadron - SSI  Anemia of critical illness. - f/u CBC intermittently  Dysphagia. - f/u with speech therapy  Best Practice (right click and "Reselect all SmartList Selections" daily)   Diet/type: tubefeeds and dysphagia diet (see orders) DVT prophylaxis: prophylactic heparin  GI prophylaxis: PPI Lines: CVL Foley:  Yes, and it is still needed Code Status:  full code Last date of multidisciplinary goals of care discussion [updated family at bedside on 8/27]  Labs       Latest Ref Rng & Units 06/18/2023    5:42 AM 06/17/2023    6:35 AM 06/16/2023    3:48 AM  CMP  Glucose 70 - 99 mg/dL 782  956  213   BUN 8 - 23 mg/dL 19  23  25    Creatinine 0.44 - 1.00 mg/dL 0.86  5.78  4.69   Sodium 135 - 145 mmol/L 140  142  142   Potassium 3.5 - 5.1 mmol/L 3.6  3.7  3.5   Chloride 98 - 111 mmol/L 100  108  113   CO2 22 - 32 mmol/L 28  22  24    Calcium 8.9 - 10.3 mg/dL 8.6  8.2  8.4        Latest Ref Rng & Units 06/18/2023    5:42 AM 06/17/2023    6:35 AM 06/15/2023    4:45 AM  CBC  WBC 4.0 - 10.5 K/uL 14.5  11.5  10.3   Hemoglobin 12.0 - 15.0 g/dL 16.1  09.6  9.7   Hematocrit 36.0 - 46.0 % 36.1  33.1  30.0   Platelets 150 - 400 K/uL 255  209  160     ABG    Component Value Date/Time   PHART 7.51 (H) 06/14/2023 2047   PCO2ART 35 06/14/2023 2047   PO2ART 128 (H)  06/14/2023 2047   HCO3 27.8 06/14/2023 2047   TCO2 20 (L) 06/12/2023 0530   ACIDBASEDEF 8.0 (H) 06/12/2023 0530   O2SAT 97.6 06/14/2023 2047    CBG (last 3)  Recent Labs    06/17/23 2336 06/18/23 0401 06/18/23 0757  GLUCAP 179* 142* 125*    Signature:  Coralyn Helling, MD [place] [facility] Pager - [phone] or [phone] 06/18/2023, 9:19 AM

## 2023-06-18 NOTE — Progress Notes (Signed)
  NEUROSURGERY PROGRESS NOTE   No issues overnight.  EXAM:  BP (!) 161/65   Pulse 93   Temp 97.9 F (36.6 C) (Axillary)   Resp (!) 25   Ht 4\' 8"  (1.422 m)   Wt 63.2 kg   SpO2 98%   BMI 31.24 kg/m   Awake, alert On low-dose sedation, Localizes LUE, moves LLE spontaneously Now moving RUE/RLE spontaneously Right groin site soft   IMPRESSION:  89 y.o. female SAH d#8 s/p subtotal coil embolization of large left MCA aneurysm, neurologically stable with improvement in RUE weakness. Pulmonary status appears stable.   No clinical spasm  PLAN: - cont supportive care per PCCM. - Maintain normotension.  - Cont Nimotop  I reviewed the situation and the above with family at bedside. All questions answered.   Lisbeth Renshaw, MD [facility]

## 2023-06-19 ENCOUNTER — Inpatient Hospital Stay (HOSPITAL_COMMUNITY): Payer: Medicare Other

## 2023-06-19 DIAGNOSIS — I609 Nontraumatic subarachnoid hemorrhage, unspecified: Secondary | ICD-10-CM

## 2023-06-19 LAB — BASIC METABOLIC PANEL
Anion gap: 12 (ref 5–15)
BUN: 43 mg/dL — ABNORMAL HIGH (ref 8–23)
CO2: 26 mmol/L (ref 22–32)
Calcium: 9.2 mg/dL (ref 8.9–10.3)
Chloride: 101 mmol/L (ref 98–111)
Creatinine, Ser: 1.02 mg/dL — ABNORMAL HIGH (ref 0.44–1.00)
GFR, Estimated: 53 mL/min — ABNORMAL LOW (ref >60)
Glucose, Bld: 144 mg/dL — ABNORMAL HIGH (ref 70–99)
Potassium: 3.7 mmol/L (ref 3.5–5.1)
Sodium: 139 mmol/L (ref 135–145)

## 2023-06-19 LAB — GLUCOSE, CAPILLARY
Glucose-Capillary: 183 mg/dL — ABNORMAL HIGH (ref 70–99)
Glucose-Capillary: 176 mg/dL — ABNORMAL HIGH (ref 70–99)
Glucose-Capillary: 136 mg/dL — ABNORMAL HIGH (ref 70–99)
Glucose-Capillary: 190 mg/dL — ABNORMAL HIGH (ref 70–99)
Glucose-Capillary: 143 mg/dL — ABNORMAL HIGH (ref 70–99)
Glucose-Capillary: 191 mg/dL — ABNORMAL HIGH (ref 70–99)

## 2023-06-19 MED ORDER — FUROSEMIDE 10 MG/ML IJ SOLN
20.0000 mg | Freq: Once | INTRAMUSCULAR | Status: AC
  Administered 2023-06-19: 20 mg via INTRAVENOUS
  Filled 2023-06-19: qty 2

## 2023-06-19 NOTE — Progress Notes (Signed)
  Inpatient Rehabilitation Admissions Coordinator   I will place rehab consult order for full assessment of potential for possible CIR admit.  Ottie Glazier, RN, MSN Rehab Admissions Coordinator [phone] 06/19/2023 4:52 PM

## 2023-06-19 NOTE — Progress Notes (Signed)
Physical Therapy Treatment Patient Details Name: Yolanda [name] MRN: [id number] DOB: August 29, 1933 Today's Date: 06/19/2023   History of Present Illness Pt is an 89 y.o. female who presented 06/10/23 with acute onset of severe headache and R-sided weakness. CT scan demonstrates evidence of a diffuse subarachnoid hemorrhage more prominent in her left sylvian fissure. 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 S/p coil embolization of L MCA aneurysm 8/19. Intubated 8/20. Cortrak placed 8/21. PMH: dementia, asthma, OA, HTN    PT Comments  Pt making slow progress with therapy and remains limited by impaired receptive and expressive communication as well as language barrier with need for interpreter. Video interpreter utilized Yolanda [name], [phone]) today and assist from daughter-in-law as well). Patient required Max +2 assist for bed mobility to reposition with bed pad and initiate rolling Lt and sitting up to EOB. Multimodal cues provided for tasks during session and pt inconsistent with ability to follow simple tactile/verbal cues. Pt unable to acitvation bil LE's isolated with cues for LAQ however able to activate bil LE while completing 2x sit<>stand with Mod +2 assist at EOB. Based on current progression updating recommendations for follow up therapy <3 hours/day. Will continue to progress as able.    If plan is discharge home, recommend the following: Two people to help with walking and/or transfers;Two people to help with bathing/dressing/bathroom;Assistance with cooking/housework;Direct supervision/assist for medications management;Direct supervision/assist for financial management;Assistance with feeding;Assist for transportation;Help with stairs or ramp for entrance;Supervision due to cognitive status   Can travel by private vehicle     No  Equipment Recommendations  Other (comment) (defer to next venue)     Recommendations for Other Services       Precautions / Restrictions Precautions Precautions: Fall Precaution Comments: posey, Lt mitten,  SBP goal <150; cortrak Restrictions Weight Bearing Restrictions: No     Mobility  Bed Mobility Overal bed mobility: Needs Assistance Bed Mobility: Rolling, Sit to Supine Rolling: Mod assist   Supine to sit: Mod assist, +2 for physical assistance, +2 for safety/equipment Sit to supine: Max assist, +2 for physical assistance, +2 for safety/equipment   General bed mobility comments: improved participation in rolling, requiring up to mod A to maintain seated position EOB however moments of min gaurd for less than ten seconds    Transfers Overall transfer level: Needs assistance Equipment used: 2 person hand held assist Transfers: Sit to/from Stand Sit to Stand: +2 safety/equipment, +2 physical assistance, Mod assist           General transfer comment: Patien remains mod A of 2 to stand, no buckling noted. OT and PT assisting with weight shifting to take incremental steps to [facility], OT having to advance RLE in order to complete    Ambulation/Gait                   Stairs             Wheelchair Mobility     Tilt Bed    Modified Rankin (Stroke Patients Only)       Balance Overall balance assessment: Needs assistance Sitting-balance support: Single extremity supported, Feet supported Sitting balance-Leahy Scale: Poor Sitting balance - Comments: min gaurd to mod A depending on activity tolerance   Standing balance support: Bilateral upper extremity supported, During functional activity Standing balance-Leahy Scale: Poor Standing balance comment: max A needed to maintain standing but pt without knee buckling  Cognition Arousal: Alert, Lethargic Behavior During Therapy: Flat affect Overall Cognitive Status: Impaired/Different from baseline Area of Impairment: Attention,  Following commands, Safety/judgement, Awareness, Problem solving, Memory                   Current Attention Level: Focused Memory: Decreased short-term memory Following Commands: Follows one step commands with increased time, Follows one step commands inconsistently Safety/Judgement: Decreased awareness of safety, Decreased awareness of deficits Awareness: Intellectual Problem Solving: Slow processing, Difficulty sequencing, Requires verbal cues, Requires tactile cues, Decreased initiation General Comments: Despite interpreter and daughter in law, patient remains inconsistent with command following and participation with ADLs and functional mobility. Benefits from gestures        Exercises      General Comments General comments (skin integrity, edema, etc.): stable on RA SpO2 >92%      Pertinent Vitals/Pain Pain Assessment Pain Assessment: Faces Faces Pain Scale: Hurts a little bit Pain Location: headache Pain Descriptors / Indicators: Discomfort, Grimacing Pain Intervention(s): Limited activity within patient's tolerance, Monitored during session, Repositioned    Home Living                          Prior Function            PT Goals (current goals can now be found in the care plan section) Acute Rehab PT Goals Patient Stated Goal: did not state; family hopeful for pt to improve PT Goal Formulation: With patient/family Time For Goal Achievement: 06/27/23 Potential to Achieve Goals: Fair Progress towards PT goals: Progressing toward goals    Frequency    Min 1X/week      PT Plan      Co-evaluation PT/OT/SLP Co-Evaluation/Treatment: Yes Reason for Co-Treatment: Necessary to address cognition/behavior during functional activity;For patient/therapist safety;To address functional/ADL transfers PT goals addressed during session: Mobility/safety with mobility;Balance OT goals addressed during session: ADL's and self-care      AM-PAC PT "6  Clicks" Mobility   Outcome Measure  Help needed turning from your back to your side while in a flat bed without using bedrails?: A Lot Help needed moving from lying on your back to sitting on the side of a flat bed without using bedrails?: Total Help needed moving to and from a bed to a chair (including a wheelchair)?: Total Help needed standing up from a chair using your arms (e.g., wheelchair or bedside chair)?: Total Help needed to walk in hospital room?: Total Help needed climbing 3-5 steps with a railing? : Total 6 Click Score: 7    End of Session Equipment Utilized During Treatment: Gait belt Activity Tolerance: Patient tolerated treatment well Patient left: in bed;with call bell/phone within reach;with bed alarm set;with family/visitor present;with restraints reapplied Nurse Communication: Mobility status;Other (comment) (SpO2 status on RA) PT Visit Diagnosis: Muscle weakness (generalized) (M62.81);Difficulty in walking, not elsewhere classified (R26.2);Other symptoms and signs involving the nervous system (R29.898)     Time: 1610-9604 PT Time Calculation (min) (ACUTE ONLY): 31 min  Charges:    $Therapeutic Activity: 8-22 mins PT General Charges $$ ACUTE PT VISIT: 1 Visit                     Wynn Maudlin, DPT Acute Rehabilitation Services Office [phone]  06/19/23 5:08 PM

## 2023-06-19 NOTE — Progress Notes (Signed)
Transcranial Doppler   Date POD PCO2 HCT BP   MCA ACA PCA OPHT SIPH VERT Basilar  8/21 SW         Right  Left   57  71   47 37      37 21        56  20   24  13    *  *   *        8/23 SW         Right  Left    74   32    48   70    24   35    36   24    26   47    *   *    *       8/26 RS          Right  Left    *   64    *   -34    *   -44    29   24    *   *    -29   -29    *       8/28 [facility]        113/64  Right  Left    48   22    *   *    21   26    17   16     *   25    -18   -15    -26                   Right  Left                                                               Right  Left                                                               Right  Left                                                       MCA = Middle Cerebral Artery      OPHT = Opthalmic Artery     BASILAR = Basilar Artery   ACA = Anterior Cerebral Artery     SIPH = Carotid Siphon PCA = Posterior Cerebral Artery   VERT = Verterbral Artery                    Normal MCA = 62+\-12 ACA = 50+\-12 PCA = 42+\-23    RT [place]ard ratio: 2.40 LT Lind[place]ard ratio: 1.16   Results can be found under chart review under CV PROC. 06/19/2023 5:32 PM Fallon Howerter RVT, RDMS

## 2023-06-19 NOTE — Progress Notes (Signed)
  NEUROSURGERY PROGRESS NOTE   No issues overnight.  EXAM:  BP (!) 128/55   Pulse 79   Temp (!) 96.8 F (36 C) (Axillary) Comment: will retake  Resp (!) 21   Ht 4\' 8"  (1.422 m)   Wt 63.2 kg   SpO2 96%   BMI 31.24 kg/m   Awake, alert On low-dose sedation, Localizes LUE, moves LLE spontaneously Now moving RUE/RLE spontaneously Right groin site soft   IMPRESSION:  89 y.o. female SAH d#9 s/p subtotal coil embolization of large left MCA aneurysm, neurologically stable with improvement in RUE weakness. Pulmonary status appears stable.   No clinical spasm, now >1wk post hemorrhage, and given her improvements over last few days and her age, I suspect she is at very low risk for spasm moving forward. I therefore think it is in her best interest to transition out of acute care setting, possibly to rehab, as quickly as possible.  PLAN: - cont supportive care per PCCM. - Maintain normotension.  - Cont Nimotop to finish 21d - Stable for transfer to stepdown under TRH or possibly rehab from neurosurgical standpoint   Lisbeth Renshaw, MD [facility]

## 2023-06-19 NOTE — Progress Notes (Signed)
Speech Language Pathology Treatment: Dysphagia  Patient Details Name: Yolanda [name] MRN: [id number] DOB: [birth year] Today's Date: 06/19/2023 Time: 4098-1191 SLP Time Calculation (min) (ACUTE ONLY): 21 min  Assessment / Plan / Recommendation Clinical Impression  Pt was seen for skilled ST targeting diet tolerance and cognitive-linguistic treatment.  Pt was lethargic during this session and required intermittent cues to maintain an appropriate LOA, therefore session focused on dysphagia and diet tolerance.  Daughter was present at bedside.  [place] phone interpreter was utilized for this session as video interpreter was not available at this time.  Pt was able to nod head to deny pain and vocalized with a groan in response to SLP questions, but no meaningful verbalizations were observed on this date.  Daughter reported that pt was able to communicate more effectively last night, but had only had a few unintelligible vocalizations this morning.  Pt consumed trials of ice chips, thin liquid and puree.  She exhibited prolonged AP transit of puree with suspected delayed swallow initiation of both puree and thin liquid.  No overt s/sx of aspiration were observed with PO trials.  Of note, pt was able to draw liquid from straw more effectively when straw was placed on L side of oral cavity (labial weakness on R side).  Family was re-educated regarding how to safely and effectively feed patient and the correct consistency of food to bring in from home (purees).  Daughter verbalized understanding.  Recommend continuation of current diet at this time.  SLP will continue to f/u acutely.      HPI HPI: 89 year old female with history of mild asthma and HTN presents 06/10/2023 to the ED with acute onset of severe headache.  CT scan demonstrates evidence of a diffuse Grade 3 subarachnoid hemorrhage more prominent in her left sylvian fissure. 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 Pt was intubated 8/21-8/23.  Pt completed a BSE on 8/20, but was consulted for re-evaluation secondary to acute respiratory distress and intubation.      SLP Plan  Continue with current plan of care      Recommendations for follow up therapy are one component of a multi-disciplinary discharge planning process, led by the attending physician.  Recommendations may be updated based on patient status, additional functional criteria and insurance authorization.    Recommendations  Diet recommendations: Thin liquid;Dysphagia 1 (puree) Liquids provided via: Cup;Straw Medication Administration: Via alternative means Supervision: Trained caregiver to feed patient;Full supervision/cueing for compensatory strategies Compensations: Slow rate;Small sips/bites;Minimize environmental distractions                        Dysphagia, oropharyngeal phase (R13.12)     Continue with current plan of care    Eino Farber, M.S., CCC-SLP Acute Rehabilitation Services Office: [phone]  Shanon Rosser [facility]  06/19/2023, 9:53 AM

## 2023-06-19 NOTE — Progress Notes (Signed)
NAMEMoeshia [name], MRN:  [id number], DOB:  1933/01/13, LOS: 9 ADMISSION DATE:  06/10/2023, CONSULTATION DATE:  06/10/2023 REFERRING MD:  Ivan Croft, CHIEF COMPLAINT:  Headache, vomiting  History of Present Illness:  89 yo [place] female presented to ER with sudden onset of severe headache, nausea and vomiting.  Associated with Rt side weakness, photophobia and facial droop.  CT head showed grade 3 SAH, and CTA showed Lt MCA aneurysm, Rt MCA aneurysm, and Rt PCA aneurysm.  Neurosurgery consulted and PCCM asked to admit to ICU.  Pertinent  Medical History  Dementia, Asthma, Arthritis, HTN  Significant Hospital Events: Including procedures, antibiotic start and stop dates in addition to other pertinent events   06/10/2023 Admission [facility] 8/20 intubated due to worsening AMS and requiring levo for sbp goal 130-150 8/21 weaned off levo 8/23 extubate, stridor >> racemic epi/decadron/bipap 8/24 agitated >> transiently on precedex 8/25 back on precedex 8/27 off precedex 8/28 transfer to progressive care.  Interim History / Subjective:  Was able to work with PT more yesterday.  Objective   Blood pressure (!) 128/55, pulse 71, temperature (!) 96.8 F (36 C), temperature source Axillary, resp. rate (!) 23, height 4\' 8"  (1.422 m), weight 63.2 kg, SpO2 98%.        Intake/Output Summary (Last 24 hours) at 06/19/2023 1002 Last data filed at 06/19/2023 0900 Gross per 24 hour  Intake 1441.14 ml  Output 1550 ml  Net -108.86 ml   Filed Weights   06/17/23 0500 06/18/23 0500 06/19/23 0500  Weight: 63.2 kg 63.2 kg 63.2 kg    Examination:  General - alert Eyes - pupils reactive ENT - no sinus tenderness, no stridor Cardiac - regular rate/rhythm, no murmur Chest - scattered rhonchi Abdomen - soft, non tender, + bowel sounds Extremities - no cyanosis, clubbing, or edema Skin - no rashes Neuro - more interactive   Resolved Hospital Problem list   Hypotension from sedation, Post-extubation  stridor  Assessment & Plan:   Grade 3 SAH 2nd to MCA aneurysm rupture. - neurosurgery following >> okay to transfer to progressive care - continue nimotop - prn analgesia for headache  Acute delirium. - off precedex 8/27 - continue zoloft, trazodone, ramelteon - reorient as able  Acute on chronic HFpEF with acute pulmonary edema. Hx of HTN. Prolonged QTc. - Echo 8/21 >> EF 70 to 75%, grade 1 DD - lasix 40 mg IV x one on 8/28 - f/u CXR intermittently - goal normotension  Asthma. - pulmicort, brovana - prn albuterol  Steroid induced hyperglycemia. - improved now that she has completed course of decadron - SSI  Anemia of critical illness. - f/u CBC intermittently  Dysphagia. - f/u with speech therapy  D/w Dr. Conchita Paris.  Will ask [facility] to assume care from 8/29 and PCCM off.  Best Practice (right click and "Reselect all SmartList Selections" daily)   Diet/type: tubefeeds and dysphagia diet (see orders) DVT prophylaxis: prophylactic heparin  GI prophylaxis: PPI Lines: CVL Foley:  Yes, and it is still needed Code Status:  full code Last date of multidisciplinary goals of care discussion [updated family at bedside on 8/28]  Labs       Latest Ref Rng & Units 06/18/2023    5:42 AM 06/17/2023    6:35 AM 06/16/2023    3:48 AM  CMP  Glucose 70 - 99 mg/dL 102  585  277   BUN 8 - 23 mg/dL 19  23  25    Creatinine 0.44 - 1.00 mg/dL  0.58  0.58  0.62   Sodium 135 - 145 mmol/L 140  142  142   Potassium 3.5 - 5.1 mmol/L 3.6  3.7  3.5   Chloride 98 - 111 mmol/L 100  108  113   CO2 22 - 32 mmol/L 28  22  24    Calcium 8.9 - 10.3 mg/dL 8.6  8.2  8.4        Latest Ref Rng & Units 06/18/2023    5:42 AM 06/17/2023    6:35 AM 06/15/2023    4:45 AM  CBC  WBC 4.0 - 10.5 K/uL 14.5  11.5  10.3   Hemoglobin 12.0 - 15.0 g/dL 16.1  09.6  9.7   Hematocrit 36.0 - 46.0 % 36.1  33.1  30.0   Platelets 150 - 400 K/uL 255  209  160     ABG    Component Value Date/Time   PHART 7.51  (H) 06/14/2023 2047   PCO2ART 35 06/14/2023 2047   PO2ART 128 (H) 06/14/2023 2047   HCO3 27.8 06/14/2023 2047   TCO2 20 (L) 06/12/2023 0530   ACIDBASEDEF 8.0 (H) 06/12/2023 0530   O2SAT 97.6 06/14/2023 2047    CBG (last 3)  Recent Labs    06/18/23 2352 06/19/23 0348 06/19/23 0748  GLUCAP 181* 183* 176*    Signature:  Coralyn Helling, MD [place] Pager - [phone] or [phone] 06/19/2023, 10:02 AM

## 2023-06-19 NOTE — Progress Notes (Signed)
Occupational Therapy Treatment Patient Details Name: Yolanda [name] MRN: [id number] DOB: 1933-10-20 Today's Date: 06/19/2023   History of present illness Pt is an 89 y.o. female who presented 06/10/23 with acute onset of severe headache and R-sided weakness. CT scan demonstrates evidence of a diffuse subarachnoid hemorrhage more prominent in her left sylvian fissure. 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 S/p coil embolization of L MCA aneurysm 8/19. Intubated 8/20. Cortrak placed 8/21. PMH: dementia, asthma, OA, HTN   OT comments  Patient continues to make incremental progress towards goals in skilled OT session. Patient's session encompassed further assessment of cognition, ADL participation, and functional mobility. Patient with potential receptive deficits in addition to expressive challenges as even with [place] interpreter and daughter in law present, patient was following less than 25% of one step commands. Patient with improved bed mobility and standing in session (mod A of 2 to stand) however requires manual movement of RLE to advance steps towards HOB. OT reassessing recommendation, and given patient's current level and ability to participate, OT recommending a less intensive option of rehab < 3 hours in order to work towards return to prior level of function. OT will continue to follow.         If plan is discharge home, recommend the following:  Two people to help with walking and/or transfers;Two people to help with bathing/dressing/bathroom;Assistance with cooking/housework;Assistance with feeding;Direct supervision/assist for medications management;Direct supervision/assist for financial management;Assist for transportation;Help with stairs or ramp for entrance;Supervision due to cognitive status   Equipment Recommendations  Other (comment) (defer to next venue)    Recommendations for Other  Services      Precautions / Restrictions Precautions Precautions: Fall Precaution Comments: posey, L mitten,  SBP goal <150; cortrak       Mobility Bed Mobility Overal bed mobility: Needs Assistance Bed Mobility: Rolling, Sit to Supine Rolling: Mod assist   Supine to sit: Mod assist, +2 for physical assistance, +2 for safety/equipment Sit to supine: Max assist, +2 for physical assistance, +2 for safety/equipment   General bed mobility comments: improved participation in rolling, requiring up to mod A to maintain seated position EOB however moments of min gaurd for less than ten seconds    Transfers Overall transfer level: Needs assistance Equipment used: 2 person hand held assist Transfers: Sit to/from Stand Sit to Stand: +2 safety/equipment, +2 physical assistance, Mod assist           General transfer comment: Patien remains mod A of 2 to stand, no buckling noted. OT and PT assisting with weight shifting to take incremental steps to [facility], OT having to advance RLE in order to complete     Balance Overall balance assessment: Needs assistance Sitting-balance support: Single extremity supported, Feet supported Sitting balance-Leahy Scale: Poor Sitting balance - Comments: min gaurd to mod A depending on activity tolerance   Standing balance support: Bilateral upper extremity supported, During functional activity Standing balance-Leahy Scale: Poor Standing balance comment: max A needed to maintain standing but pt without knee buckling                           ADL either performed or assessed with clinical judgement   ADL Overall ADL's : Needs assistance/impaired Eating/Feeding: Minimal assistance;Sitting   Grooming: Minimal assistance;Sitting;Wash/dry hands;Wash/dry face Grooming Details (indicate cue type and reason): simulated EOB  Lower Body Dressing: Total assistance;Sitting/lateral leans   Toilet Transfer: Maximal assistance;+2 for  physical assistance;+2 for safety/equipment Toilet Transfer Details (indicate cue type and reason): simulated with sit<>stands         Functional mobility during ADLs: Maximal assistance;+2 for physical assistance;+2 for safety/equipment;Cueing for sequencing;Cueing for safety General ADL Comments: Patient continues to require significant assist for all aspects of care    Extremity/Trunk Assessment              Vision       Perception     Praxis      Cognition Arousal: Alert, Lethargic Behavior During Therapy: Flat affect Overall Cognitive Status: Impaired/Different from baseline Area of Impairment: Attention, Following commands, Safety/judgement, Awareness, Problem solving, Memory                   Current Attention Level: Focused Memory: Decreased short-term memory Following Commands: Follows one step commands with increased time, Follows one step commands inconsistently Safety/Judgement: Decreased awareness of safety, Decreased awareness of deficits Awareness: Intellectual Problem Solving: Slow processing, Difficulty sequencing, Requires verbal cues, Requires tactile cues, Decreased initiation General Comments: Despite interpreter and daughter in law, patient remains inconsistent with command following and participation with ADLs and functional mobility. Benefits from gestures        Exercises      Shoulder Instructions       General Comments VSS on RA, RN informed    Pertinent Vitals/ Pain       Pain Assessment Pain Assessment: Faces Faces Pain Scale: Hurts a little bit Pain Location: headache Pain Descriptors / Indicators: Discomfort, Grimacing Pain Intervention(s): Limited activity within patient's tolerance, Monitored during session, Repositioned  Home Living                                          Prior Functioning/Environment              Frequency  Min 1X/week        Progress Toward Goals  OT Goals(current  goals can now be found in the care plan section)     Acute Rehab OT Goals Patient Stated Goal: unable to state OT Goal Formulation: Patient unable to participate in goal setting Time For Goal Achievement: 06/27/23 Potential to Achieve Goals: Fair  Plan      Co-evaluation    PT/OT/SLP Co-Evaluation/Treatment: Yes Reason for Co-Treatment: Necessary to address cognition/behavior during functional activity;For patient/therapist safety;To address functional/ADL transfers PT goals addressed during session: Mobility/safety with mobility;Balance OT goals addressed during session: ADL's and self-care      AM-PAC OT "6 Clicks" Daily Activity     Outcome Measure   Help from another person eating meals?: A Little Help from another person taking care of personal grooming?: A Little Help from another person toileting, which includes using toliet, bedpan, or urinal?: Total Help from another person bathing (including washing, rinsing, drying)?: Total Help from another person to put on and taking off regular upper body clothing?: A Lot Help from another person to put on and taking off regular lower body clothing?: Total 6 Click Score: 11    End of Session Equipment Utilized During Treatment: Gait belt  OT Visit Diagnosis: Unsteadiness on feet (R26.81);Other abnormalities of gait and mobility (R26.89);Muscle weakness (generalized) (M62.81);Hemiplegia and hemiparesis;Other symptoms and signs involving cognitive function;Cognitive communication deficit (R41.841) Symptoms and signs involving cognitive functions: Nontraumatic SAH Hemiplegia -  Right/Left: Right Hemiplegia - dominant/non-dominant: Dominant Hemiplegia - caused by: Nontraumatic SAH   Activity Tolerance Patient tolerated treatment well   Patient Left in bed;with call bell/phone within reach;with bed alarm set;with restraints reapplied;with family/visitor present   Nurse Communication Mobility status        Time: 1610-9604 OT  Time Calculation (min): 30 min  Charges: OT General Charges $OT Visit: 1 Visit OT Treatments $Self Care/Home Management : 8-22 mins  Yolanda [name], OTR/L Acute Rehabilitation Services [phone]   Yolanda [name] 06/19/2023, 4:20 PM

## 2023-06-20 DIAGNOSIS — D72829 Elevated white blood cell count, unspecified: Secondary | ICD-10-CM

## 2023-06-20 DIAGNOSIS — I609 Nontraumatic subarachnoid hemorrhage, unspecified: Secondary | ICD-10-CM | POA: Diagnosis not present

## 2023-06-20 LAB — CBC
HCT: 38.2 % (ref 36.0–46.0)
Hemoglobin: 12.6 g/dL (ref 12.0–15.0)
MCH: 28.8 pg (ref 26.0–34.0)
MCHC: 33 g/dL (ref 30.0–36.0)
MCV: 87.2 fL (ref 80.0–100.0)
Platelets: 306 10*3/uL (ref 150–400)
RBC: 4.38 MIL/uL (ref 3.87–5.11)
RDW: 14 % (ref 11.5–15.5)
WBC: 20.2 10*3/uL — ABNORMAL HIGH (ref 4.0–10.5)
nRBC: 0 % (ref 0.0–0.2)

## 2023-06-20 LAB — HEPATIC FUNCTION PANEL
ALT: 33 U/L (ref 0–44)
AST: 14 U/L — ABNORMAL LOW (ref 15–41)
Albumin: 3.1 g/dL — ABNORMAL LOW (ref 3.5–5.0)
Alkaline Phosphatase: 75 U/L (ref 38–126)
Bilirubin, Direct: 0.2 mg/dL (ref 0.0–0.2)
Indirect Bilirubin: 0.8 mg/dL (ref 0.3–0.9)
Total Bilirubin: 1 mg/dL (ref 0.3–1.2)
Total Protein: 6.9 g/dL (ref 6.5–8.1)

## 2023-06-20 LAB — BASIC METABOLIC PANEL
Anion gap: 11 (ref 5–15)
BUN: 49 mg/dL — ABNORMAL HIGH (ref 8–23)
CO2: 27 mmol/L (ref 22–32)
Calcium: 9.4 mg/dL (ref 8.9–10.3)
Chloride: 99 mmol/L (ref 98–111)
Creatinine, Ser: 1.2 mg/dL — ABNORMAL HIGH (ref 0.44–1.00)
GFR, Estimated: 44 mL/min — ABNORMAL LOW (ref >60)
Glucose, Bld: 137 mg/dL — ABNORMAL HIGH (ref 70–99)
Potassium: 3.7 mmol/L (ref 3.5–5.1)
Sodium: 137 mmol/L (ref 135–145)

## 2023-06-20 LAB — GLUCOSE, CAPILLARY
Glucose-Capillary: 134 mg/dL — ABNORMAL HIGH (ref 70–99)
Glucose-Capillary: 153 mg/dL — ABNORMAL HIGH (ref 70–99)
Glucose-Capillary: 138 mg/dL — ABNORMAL HIGH (ref 70–99)
Glucose-Capillary: 180 mg/dL — ABNORMAL HIGH (ref 70–99)
Glucose-Capillary: 172 mg/dL — ABNORMAL HIGH (ref 70–99)
Glucose-Capillary: 207 mg/dL — ABNORMAL HIGH (ref 70–99)

## 2023-06-20 MED ORDER — LORAZEPAM 2 MG/ML IJ SOLN
0.5000 mg | Freq: Once | INTRAMUSCULAR | Status: AC
  Administered 2023-06-20: 0.5 mg via INTRAVENOUS
  Filled 2023-06-20: qty 1

## 2023-06-20 MED ORDER — BANATROL TF EN LIQD
60.0000 mL | Freq: Two times a day (BID) | ENTERAL | Status: DC
  Administered 2023-06-20 – 2023-06-25 (×12): 60 mL via ORAL
  Filled 2023-06-20 (×12): qty 60

## 2023-06-20 MED ORDER — BISOPROLOL FUMARATE 5 MG PO TABS
5.0000 mg | ORAL_TABLET | Freq: Every day | ORAL | Status: DC
  Administered 2023-06-20 – 2023-06-27 (×8): 5 mg via ORAL
  Filled 2023-06-20 (×8): qty 1

## 2023-06-20 MED ORDER — SODIUM CHLORIDE 0.9 % IV SOLN
3.0000 g | Freq: Two times a day (BID) | INTRAVENOUS | Status: DC
  Administered 2023-06-20 – 2023-06-25 (×10): 3 g via INTRAVENOUS
  Filled 2023-06-20 (×11): qty 8

## 2023-06-20 MED ORDER — OXYCODONE HCL 5 MG PO TABS
5.0000 mg | ORAL_TABLET | Freq: Four times a day (QID) | ORAL | Status: DC | PRN
  Administered 2023-06-21 – 2023-06-27 (×11): 5 mg via ORAL
  Filled 2023-06-20 (×11): qty 1

## 2023-06-20 MED ORDER — SERTRALINE HCL 20 MG/ML PO CONC
25.0000 mg | Freq: Every day | ORAL | Status: DC
  Filled 2023-06-20: qty 1.25

## 2023-06-20 MED ORDER — ACETAMINOPHEN 325 MG PO TABS
650.0000 mg | ORAL_TABLET | Freq: Four times a day (QID) | ORAL | Status: DC | PRN
  Administered 2023-06-20 – 2023-06-26 (×9): 650 mg via ORAL
  Filled 2023-06-20 (×10): qty 2

## 2023-06-20 MED ORDER — TRAZODONE HCL 50 MG PO TABS
50.0000 mg | ORAL_TABLET | Freq: Every day | ORAL | Status: DC
  Administered 2023-06-20: 50 mg via ORAL
  Filled 2023-06-20: qty 1

## 2023-06-20 MED ORDER — ACETAMINOPHEN 650 MG RE SUPP: 650.0000 mg | Freq: Four times a day (QID) | RECTAL | Status: DC | PRN

## 2023-06-20 MED ORDER — RAMELTEON 8 MG PO TABS
8.0000 mg | ORAL_TABLET | Freq: Every day | ORAL | Status: DC
  Administered 2023-06-20 – 2023-06-26 (×7): 8 mg via ORAL
  Filled 2023-06-20 (×8): qty 1

## 2023-06-20 MED ORDER — DIPHENHYDRAMINE HCL 50 MG/ML IJ SOLN
12.5000 mg | Freq: Three times a day (TID) | INTRAMUSCULAR | Status: DC | PRN
  Administered 2023-06-20 – 2023-06-21 (×2): 12.5 mg via INTRAVENOUS
  Filled 2023-06-20 (×2): qty 1

## 2023-06-20 MED ORDER — OSMOLITE 1.2 CAL PO LIQD
1000.0000 mL | ORAL | Status: DC
  Administered 2023-06-20 – 2023-06-24 (×5): 1000 mL

## 2023-06-20 MED ORDER — SERTRALINE HCL 50 MG PO TABS
25.0000 mg | ORAL_TABLET | Freq: Every day | ORAL | Status: DC
  Administered 2023-06-20 – 2023-06-27 (×8): 25 mg via ORAL
  Filled 2023-06-20 (×8): qty 1

## 2023-06-20 NOTE — Progress Notes (Signed)
Inpatient Rehab Admissions Coordinator:  Consult received. Note PT/OT updated recommendations to SNF. TOC made aware.  Wolfgang Phoenix, MS, CCC-SLP Admissions Coordinator [phone]

## 2023-06-20 NOTE — Progress Notes (Signed)
Pharmacy Antibiotic Note  Yolanda [name] is a 89 y.o. female admitted on 06/10/2023 with aspiration pneumonia.  Pharmacy has been consulted for ampicillin/sulbactam dosing.   Plan: ampicillin/sulbactam 3g q12hr Monitor cultures, clinical status, renal function Narrow abx as able and f/u duration     Height: 4\' 8"  (142.2 cm) Weight: 63.2 kg (139 lb 5.3 oz) IBW/kg (Calculated) : 36.3  Temp (24hrs), Avg:98.2 F (36.8 C), Min:97.7 F (36.5 C), Max:98.7 F (37.1 C)  Recent Labs  Lab 06/14/23 0447 06/15/23 0445 06/16/23 0348 06/17/23 0635 06/18/23 0542 06/19/23 1204 06/20/23 0329  WBC 13.3* 10.3  --  11.5* 14.5*  --  20.2*  CREATININE 0.82 0.83 0.62 0.58 0.58 1.02* 1.20*    Estimated Creatinine Clearance: 24.1 mL/min (A) (by C-G formula based on SCr of 1.2 mg/dL (H)).    No Known Allergies  Antimicrobials this admission: Ampsulb 8/29 >>     Microbiology results: 8/21 Sputum: ngtd  8/19 MRSA PCR: neg  Thank you for allowing pharmacy to be a part of this patient's care.   Alphia Moh, PharmD, BCPS, BCCP Clinical Pharmacist  Please check AMION for all [facility] Pharmacy phone numbers After 10:00 PM, call Main Pharmacy [phone]

## 2023-06-20 NOTE — Progress Notes (Signed)
  NEUROSURGERY PROGRESS NOTE   No issues overnight.  EXAM:  BP (!) 168/67 (BP Location: Left Arm)   Pulse 81   Temp 98.2 F (36.8 C) (Oral)   Resp 13   Ht 4\' 8"  (1.422 m)   Wt 63.2 kg   SpO2 99%   BMI 31.24 kg/m   Awake, alert Follows commands through translation Localizes LUE, moves LLE spontaneously Now moving RUE/RLE purposefully   IMPRESSION:  89 y.o. female SAH d#10 s/p subtotal coil embolization of large left MCA aneurysm, neurologically stable with improvement in RUE weakness. Pulmonary status appears stable.   No clinical spasm, now >1wk post hemorrhage, and given her improvements over last few days and her age, I suspect she is at very low risk for spasm moving forward. I therefore think it is in her best interest to transition out of acute care setting, likely to rehab, as quickly as possible.  PLAN: - cont supportive care per TRH. - Cont Nimotop to finish 21d - Stable for transfer to CIR from neurosurgical standpoint   Lisbeth Renshaw, MD [facility]

## 2023-06-20 NOTE — Progress Notes (Signed)
Nutrition Follow-up  DOCUMENTATION CODES:   Not applicable  INTERVENTION:  - Modify to Nocturnal feeds of Osmolite 1.2 @ 83 mL/hr x12 hrs (996 mL)  - Prosource TF20 60 mL daily  - Provides 1275 kcals, 75 gm protein, and 819 mL free water.  - Continue Banatrol BID   - Continue Dys 1 diet, Thin Liquids.   NUTRITION DIAGNOSIS:   Inadequate oral intake related to inability to eat as evidenced by NPO status.  GOAL:   Patient will meet greater than or equal to 90% of their needs  MONITOR:   TF tolerance  REASON FOR ASSESSMENT:   Consult Enteral/tube feeding initiation and management  ASSESSMENT:   Pt with PMH of dementia admitted with [facility] s/p L MCA aneurysm coil embolization.  Meds reviewed: sliding scale insulin. Labs reviewed: BUN/Creatinine elevated.   Pt is now on room air. Diet remains Dys 1, thin liquids. Cortrak remains in place. Pt remains oriented x1. RD will modify patient over to nocturnal feeds to allow her some time off of tube feeds to hopefully stimulate her appetite. TF will still meet lower end of energy needs. RD will continue to monitor PO intakes and TF tolerance.   Diet Order:   Diet Order             DIET - DYS 1 Fluid consistency: Thin  Diet effective now                   EDUCATION NEEDS:   No education needs have been identified at this time  Skin:  Skin Assessment: Reviewed RN Assessment  Last BM:  8/28 - type 7  Height:   Ht Readings from Last 1 Encounters:  06/10/23 4\' 8"  (1.422 m)    Weight:   Wt Readings from Last 1 Encounters:  06/19/23 63.2 kg    Ideal Body Weight:     BMI:  Body mass index is 31.24 kg/m.  Estimated Nutritional Needs:   Kcal:  1200-1400  Protein:  70-85 grams  Fluid:  > 1.5 L/day  Bethann Humble, RD, LDN, CNSC.

## 2023-06-20 NOTE — Progress Notes (Signed)
[facility] PROGRESS NOTE   Yolanda [name] ZOX:[id number] DOB: Nov 17, 1932 DOA: 06/10/2023  PCP: Pcp, No  Brief History/Interval Summary: 89 yo [place] female with a past medical history of asthma, dementia, essential hypertension presented to ER with sudden onset of severe headache, nausea and vomiting. Associated with Rt side weakness, photophobia and facial droop. CT head showed grade 3 SAH, and CTA showed Lt MCA aneurysm, Rt MCA aneurysm, and Rt PCA aneurysm. Neurosurgery consulted and PCCM asked to admit to ICU.  Patient underwent coiling of the left MCA aneurysm.  Patient also needed to be intubated.  She developed stridor after extubation which was treated with racemic epinephrine and Decadron.  Respiratory status improved.  She was transferred to progressive floor.  Consultants: Neurosurgery.  Pulmonology  Procedures: Status post subtotal coil embolization of large left MCA aneurysm    Subjective/Interval History: Patient's granddaughter is at the bedside who was able to interpret.  Patient complains of slight headache.  Feeling restless this morning.  No other issues mention.    Assessment/Plan:  Subarachnoid hemorrhage/aneurysm rupture Seen by neurosurgery.  Was initially admitted to ICU. Underwent coil embolization of the left MCA aneurysm. Neurological status is stable.  As needed medications for headache. Neurosurgery recommends 21-day course of nimodipine. Seen by PT and OT.  Inpatient rehabilitation is recommended.  Acute delirium Off Precedex since 8/27. Patient noted to be on Zoloft trazodone and Rozerem.  Mentation appears to be stable at this time.  Acute on chronic diastolic CHF/acute pulmonary edema/history of essential hypertension/prolonged QTc Echocardiogram showed normal EF.  Was given Lasix x 1 on 8/28.  Respiratory status has improved.  Saturating normal on room air.  Continue to monitor for now.  History of asthma Stable.  Steroid-induced  hyperglycemia Improved since she has completed course of dexamethasone.  Anemia of critical illness Improved.  Leukocytosis Likely due to steroids.  She is afebrile.  Dysphagia Speech therapy is following.  Currently has a cortrak feeding tube.  DVT Prophylaxis: Subcutaneous heparin Code Status: Full code Family Communication: Discussed with granddaughter Disposition Plan: Inpatient rehab when bed is available     Medications: Scheduled:  arformoterol  15 mcg Nebulization BID   bisoprolol  5 mg Oral Daily   budesonide (PULMICORT) nebulizer solution  0.5 mg Nebulization BID   Chlorhexidine Gluconate Cloth  6 each Topical Daily   feeding supplement (PROSource TF20)  60 mL Per Tube Daily   fiber supplement (BANATROL TF)  60 mL Oral BID   heparin injection (subcutaneous)  5,000 Units Subcutaneous Q8H   insulin aspart  0-6 Units Subcutaneous Q4H   niMODipine  60 mg Per Tube Q4H   Or   niMODipine  60 mg Oral Q4H   mouth rinse  15 mL Mouth Rinse 4 times per day   ramelteon  8 mg Oral QHS   sertraline  25 mg Oral Daily   traZODone  50 mg Oral QHS   Continuous:  sodium chloride     sodium chloride 10 mL/hr at 06/19/23 0900   feeding supplement (OSMOLITE 1.2 CAL) 1,000 mL (06/20/23 0601)   WJX:BJYNWGNFAOZHY **OR** acetaminophen, albuterol, labetalol, mouth rinse, oxyCODONE  Antibiotics: Anti-infectives (From admission, onward)    Start     Dose/Rate Route Frequency Ordered Stop   06/12/23 1130  Ampicillin-Sulbactam (UNASYN) 3 g in sodium chloride 0.9 % 100 mL IVPB  Status:  Discontinued        3 g 200 mL/hr over 30 Minutes Intravenous Every 6 hours 06/12/23 1036 06/13/23  3875       Objective:  Vital Signs  Vitals:   06/19/23 1946 06/19/23 2326 06/20/23 0336 06/20/23 0732  BP: 130/62 (!) 145/52 (!) 150/64 (!) 168/67  Pulse: 73  80 81  Resp: 14 20 17 13   Temp: 97.9 F (36.6 C) 98.1 F (36.7 C) 98.3 F (36.8 C) 98.2 F (36.8 C)  TempSrc: Axillary Oral Oral Oral   SpO2: 92% 93% 95% 99%  Weight:      Height:        Intake/Output Summary (Last 24 hours) at 06/20/2023 1003 Last data filed at 06/19/2023 1757 Gross per 24 hour  Intake --  Output 300 ml  Net -300 ml   Filed Weights   06/17/23 0500 06/18/23 0500 06/19/23 0500  Weight: 63.2 kg 63.2 kg 63.2 kg    General appearance: Awake alert.  In no distress Resp: Clear to auscultation bilaterally.  Normal effort Cardio: S1-S2 is normal regular.  No S3-S4.  No rubs murmurs or bruit GI: Abdomen is soft.  Nontender nondistended.  Bowel sounds are present normal.  No masses organomegaly Extremities: No edema.  Full range of motion of lower extremities. Neurologic: No obvious focal neurological deficits noted.   Lab Results:  Data Reviewed: I have personally reviewed following labs and reports of the imaging studies  CBC: Recent Labs  Lab 06/14/23 0447 06/15/23 0445 06/17/23 0635 06/18/23 0542 06/20/23 0329  WBC 13.3* 10.3 11.5* 14.5* 20.2*  HGB 9.7* 9.7* 10.2* 11.9* 12.6  HCT 30.3* 30.0* 33.1* 36.1 38.2  MCV 88.9 89.3 91.4 89.4 87.2  PLT 149* 160 209 255 306    Basic Metabolic Panel: Recent Labs  Lab 06/13/23 1549 06/13/23 1800 06/14/23 0447 06/15/23 0445 06/16/23 0348 06/17/23 0635 06/18/23 0542 06/19/23 1204 06/20/23 0329  NA 139  --  140 144 142 142 140 139 137  K 3.5  --  3.5 3.5 3.5 3.7 3.6 3.7 3.7  CL 108  --  105 103 113* 108 100 101 99  CO2 24  --  24 25 24 22 28 26 27   GLUCOSE 145*  --  150* 174* 158* 126* 133* 144* 137*  BUN 18  --  17 23 25* 23 19 43* 49*  CREATININE 0.71  --  0.82 0.83 0.62 0.58 0.58 1.02* 1.20*  CALCIUM 7.3*  --  7.9* 8.2* 8.4* 8.2* 8.6* 9.2 9.4  MG  --  2.0 2.0 2.2  --   --   --   --   --   PHOS 2.8 2.8 2.1* 3.2  --   --   --   --   --     GFR: Estimated Creatinine Clearance: 24.1 mL/min (A) (by C-G formula based on SCr of 1.2 mg/dL (H)).  Liver Function Tests: Recent Labs  Lab 06/14/23 0447  AST 15  ALT 33  ALKPHOS 53  BILITOT  1.3*  PROT 6.1*  ALBUMIN 2.7*    CBG: Recent Labs  Lab 06/19/23 1517 06/19/23 1949 06/19/23 2325 06/20/23 0330 06/20/23 0716  GLUCAP 190* 143* 191* 134* 153*     Recent Results (from the past 240 hour(s))  MRSA Next Gen by PCR, Nasal     Status: None   Collection Time: 06/10/23 11:43 PM   Specimen: Nasal Mucosa; Nasal Swab  Result Value Ref Range Status   MRSA by PCR Next Gen NOT DETECTED NOT DETECTED Final    Comment: (NOTE) The GeneXpert MRSA Assay (FDA approved for NASAL specimens only), is one  component of a comprehensive MRSA colonization surveillance program. It is not intended to diagnose MRSA infection nor to guide or monitor treatment for MRSA infections. Test performance is not FDA approved in patients less than 63 years old. Performed at [facility], [address] [place], [place] [address]   Culture, Respiratory w Gram Stain     Status: None   Collection Time: 06/12/23  9:46 AM   Specimen: Tracheal Aspirate; Respiratory  Result Value Ref Range Status   Specimen Description TRACHEAL ASPIRATE  Final   Special Requests NONE  Final   Gram Stain NO WBC SEEN NO ORGANISMS SEEN   Final   Culture   Final    RARE Normal respiratory flora-no Staph aureus or Pseudomonas seen Performed at [facility], [address] [place], [place] [address]    Report Status 06/14/2023 FINAL  Final      Radiology Studies: VAS [place] TRANSCRANIAL DOPPLER  Result Date: 06/19/2023  Transcranial Doppler Patient Name:  AMRAN MARREN  Date of Exam:   06/19/2023 Medical Rec #: [id number]     Accession #:    [id number] Date of Birth: [birth year]     Patient Gender: F Patient Age:   64 years Exam Location:  [facility] Procedure:      VAS [place] TRANSCRANIAL DOPPLER Referring Phys: Maralyn Sago GROCE --------------------------------------------------------------------------------  Indications: Subarachnoid hemorrhage. Comparison Study: Previous exam 06/17/2023 Performing Technologist:  Ernestene Mention RVT, RDMS  Examination Guidelines: A complete evaluation includes B-mode imaging, spectral Doppler, color Doppler, and power Doppler as needed of all accessible portions of each vessel. Bilateral testing is considered an integral part of a complete examination. Limited examinations for reoccurring indications may be performed as noted.  +----------+---------------+----------+-----------+-------------+ RIGHT TCD Right VM (cm/s)Depth (cm)Pulsatility   Comment    +----------+---------------+----------+-----------+-------------+ MCA             48                    1.57                  +----------+---------------+----------+-----------+-------------+ ACA                                           not insonated +----------+---------------+----------+-----------+-------------+ Term ICA        27                    1.55                  +----------+---------------+----------+-----------+-------------+ PCA P1          21                    1.54                  +----------+---------------+----------+-----------+-------------+ Opthalmic       17                    2.00                  +----------+---------------+----------+-----------+-------------+ ICA siphon                                    not insonated +----------+---------------+----------+-----------+-------------+ Vertebral       -  18                   1.47                  +----------+---------------+----------+-----------+-------------+ Distal ICA      -20                   1.74                  +----------+---------------+----------+-----------+-------------+  +----------+--------------+----------+-----------+-------------+ LEFT TCD  Left VM (cm/s)Depth (cm)Pulsatility   Comment    +----------+--------------+----------+-----------+-------------+ MCA             22                   1.54                  +----------+--------------+----------+-----------+-------------+ ACA                                           not insonated +----------+--------------+----------+-----------+-------------+ Term ICA        25                   1.54                  +----------+--------------+----------+-----------+-------------+ PCA P1          26                   1.57                  +----------+--------------+----------+-----------+-------------+ Opthalmic       16                   1.94                  +----------+--------------+----------+-----------+-------------+ ICA siphon      25                   1.69                  +----------+--------------+----------+-----------+-------------+ Vertebral      -15                   1.24                  +----------+--------------+----------+-----------+-------------+ Distal ICA     -19                   1.60                  +----------+--------------+----------+-----------+-------------+  +------------+-------+-------+             VM cm/sComment +------------+-------+-------+ Prox Basilar  -24   1.64   +------------+-------+-------+ Dist Basilar  -26   1.63   +------------+-------+-------+ +----------------------+----+ Right Lindegaard Ratio2.40 +----------------------+----+ +---------------------+----+ Left Lindegaard Ratio1.16 +---------------------+----+    Preliminary        LOS: 10 days   Taylynn Easton [employer] on [email or web address]  06/20/2023, 10:03 AM

## 2023-06-21 ENCOUNTER — Inpatient Hospital Stay (HOSPITAL_COMMUNITY): Payer: Medicare Other

## 2023-06-21 DIAGNOSIS — J690 Pneumonitis due to inhalation of food and vomit: Secondary | ICD-10-CM | POA: Diagnosis not present

## 2023-06-21 DIAGNOSIS — I609 Nontraumatic subarachnoid hemorrhage, unspecified: Secondary | ICD-10-CM | POA: Diagnosis not present

## 2023-06-21 LAB — BASIC METABOLIC PANEL
Anion gap: 14 (ref 5–15)
BUN: 46 mg/dL — ABNORMAL HIGH (ref 8–23)
CO2: 24 mmol/L (ref 22–32)
Calcium: 9.5 mg/dL (ref 8.9–10.3)
Chloride: 106 mmol/L (ref 98–111)
Creatinine, Ser: 1.03 mg/dL — ABNORMAL HIGH (ref 0.44–1.00)
GFR, Estimated: 52 mL/min — ABNORMAL LOW (ref >60)
Glucose, Bld: 160 mg/dL — ABNORMAL HIGH (ref 70–99)
Potassium: 4 mmol/L (ref 3.5–5.1)
Sodium: 144 mmol/L (ref 135–145)

## 2023-06-21 LAB — GLUCOSE, CAPILLARY
Glucose-Capillary: 165 mg/dL — ABNORMAL HIGH (ref 70–99)
Glucose-Capillary: 126 mg/dL — ABNORMAL HIGH (ref 70–99)
Glucose-Capillary: 107 mg/dL — ABNORMAL HIGH (ref 70–99)
Glucose-Capillary: 108 mg/dL — ABNORMAL HIGH (ref 70–99)
Glucose-Capillary: 155 mg/dL — ABNORMAL HIGH (ref 70–99)
Glucose-Capillary: 162 mg/dL — ABNORMAL HIGH (ref 70–99)

## 2023-06-21 LAB — CBC
HCT: 36.4 % (ref 36.0–46.0)
Hemoglobin: 11.9 g/dL — ABNORMAL LOW (ref 12.0–15.0)
MCH: 28.6 pg (ref 26.0–34.0)
MCHC: 32.7 g/dL (ref 30.0–36.0)
MCV: 87.5 fL (ref 80.0–100.0)
Platelets: 263 10*3/uL (ref 150–400)
RBC: 4.16 MIL/uL (ref 3.87–5.11)
RDW: 14.3 % (ref 11.5–15.5)
WBC: 30.3 10*3/uL — ABNORMAL HIGH (ref 4.0–10.5)
nRBC: 0 % (ref 0.0–0.2)

## 2023-06-21 MED ORDER — TRAZODONE HCL 100 MG PO TABS
100.0000 mg | ORAL_TABLET | Freq: Every day | ORAL | Status: DC
  Administered 2023-06-21 – 2023-06-26 (×6): 100 mg via ORAL
  Filled 2023-06-21 (×6): qty 1

## 2023-06-21 MED ORDER — MELATONIN 5 MG PO TABS: 5.0000 mg | ORAL_TABLET | Freq: Every evening | ORAL | Status: DC | PRN

## 2023-06-21 NOTE — Progress Notes (Signed)
Transcranial Doppler   Date POD PCO2 HCT BP   MCA ACA PCA OPHT SIPH VERT Basilar  8/21 SW         Right  Left   57  71   47 37      37 21        56  20   24  13    *  *   *        8/23 SW         Right  Left    74   32    48   70    24   35    36   24    26   47    *   *    *       8/26 RS          Right  Left    *   64    *   -34    *   -44    29   24    *   *    -29   -29    *       8/28 [facility]        113/64  Right  Left    48   22    *   *    21   26    17   16     *   25    -18   -15    -26       8/30   RH         Right  Left    48   41    -28   -27    17   -24    24   20     *   32    -16   -16    -27                 Right  Left                                                               Right  Left                                                       MCA = Middle Cerebral Artery      OPHT = Opthalmic Artery     BASILAR = Basilar Artery   ACA = Anterior Cerebral Artery     SIPH = Carotid Siphon PCA = Posterior Cerebral Artery   VERT = Verterbral Artery                    Normal MCA = 62+\-12 ACA = 50+\-12 PCA = 42+\-23    RT Lindegaard ratio: 2.3 LT Lindegaard ratio: 2.9   Results can be found under chart review under CV PROC. 06/21/2023 4:35 PM Jean Rosenthal, RDMS, RVT

## 2023-06-21 NOTE — Progress Notes (Signed)
Pt posey belt discontinued due to pt sliding under. Pt is not getting out of bed. Family at bedside. Will reassess if posey restraint needed.

## 2023-06-21 NOTE — Care Management Important Message (Signed)
Important Message  Patient Details  Name: Yolanda [name] MRN: [id number] Date of Birth: 08-31-1933   Medicare Important Message Given:  Yes     Sherilyn Banker 06/21/2023, 2:46 PM

## 2023-06-21 NOTE — Progress Notes (Signed)
  NEUROSURGERY PROGRESS NOTE   No issues overnight.  EXAM:  BP (!) 152/68 (BP Location: Right Arm)   Pulse 90   Temp 99.4 F (37.4 C) (Oral)   Resp 17   Ht 4\' 8"  (1.422 m)   Wt 63.2 kg   SpO2 98%   BMI 31.24 kg/m   Awake, alert Follows commands through translation Localizes LUE, moves LLE spontaneously Moving RUE/RLE purposefully   IMPRESSION:  89 y.o. female SAH d#11 s/p subtotal coil embolization of large left MCA aneurysm, neurologically stable.   No clinical spasm, now >1wk post hemorrhage, and given her improvements over last few days and her age, I suspect she is at very low risk for spasm moving forward. I therefore think it is in her best interest to transition out of acute care setting. Pts family appear to indicate they would prefer to take her home if possible.  While the patient does have subtotal treatment of her LMCA aneurysm and unruptured contralateral aneurysms, with her age and current functional status I do not think we would consider elective treatment/re-treatment of her aneurysms in the future.  PLAN: - cont supportive care per TR[facility]. - Cont Nimotop to finish 21d - Dispo planning - Can f/u in my office in 4 weeks.   Lisbeth Renshaw, MD [facility]

## 2023-06-21 NOTE — Progress Notes (Signed)
[facility] PROGRESS NOTE   Satcha Legleiter YNW:[id number] DOB: 26-May-1933 DOA: 06/10/2023  PCP: Pcp, No  Brief History/Interval Summary: 89 yo [place] female with a past medical history of asthma, dementia, essential hypertension presented to ER with sudden onset of severe headache, nausea and vomiting. Associated with Rt side weakness, photophobia and facial droop. CT head showed grade 3 SAH, and CTA showed Lt MCA aneurysm, Rt MCA aneurysm, and Rt PCA aneurysm. Neurosurgery consulted and PCCM asked to admit to ICU.  Patient underwent coiling of the left MCA aneurysm.  Patient also needed to be intubated.  She developed stridor after extubation which was treated with racemic epinephrine and Decadron.  Respiratory status improved.  She was transferred to progressive floor.  Consultants: Neurosurgery.  Pulmonology  Procedures: Status post subtotal coil embolization of large left MCA aneurysm    Subjective/Interval History: Patient's son was at the bedside.  He mentioned that the patient had a restless night.  Patient is awake this morning.  Denies any complaints.  He mentions that she did complain of a headache overnight.     Assessment/Plan:  Subarachnoid hemorrhage/aneurysm rupture Seen by neurosurgery.  Was initially admitted to ICU. Underwent coil embolization of the left MCA aneurysm. Neurological status is stable.  As needed medications for headache. Neurosurgery recommends 21-day course of nimodipine. Seen by PT and OT.  Initially inpatient rehabilitation was recommended.  However patient not able to tolerate high intensity of therapy.  So now skilled nursing facility for rehab was recommended.  Family prefers to take her home.  Acute delirium Off Precedex since 8/27. Patient noted to be on Zoloft trazodone and Rozerem.   She is still experiencing difficulty sleeping.  Will increase the dose of trazodone.  Acute on chronic diastolic CHF/acute pulmonary edema/history of  essential hypertension/prolonged QTc Echocardiogram showed normal EF.  Was given Lasix x 1 on 8/28.  Respiratory status has improved.  Saturating normal on room air.  Continue to monitor for now.  Aspiration pneumonia Chest x-ray showed right lung opacity.  WBC was noted to be climbing.  She was started on Unasyn on 8/29.  Currently off of oxygen.  Dysphagia Speech therapy is following.  Currently has a cortrak feeding tube.  Currently on dysphagia 1 diet.  Has not been eating much.  Speech therapy to reevaluate.  Tube feedings for now.  History of asthma Stable.  Steroid-induced hyperglycemia Improved since she has completed course of dexamethasone.  Anemia of critical illness Improved.  Leukocytosis Initially due to steroids however started increasing again after she was taken off of steroids send likely due to aspiration pneumonia.  See above.   DVT Prophylaxis: Subcutaneous heparin Code Status: Full code Family Communication: Discussed with son Disposition Plan: SNF is being recommended now.  Son declines.  Would like to take her home.  Will need to address feeding tube issue before discharge.     Medications: Scheduled:  arformoterol  15 mcg Nebulization BID   bisoprolol  5 mg Oral Daily   budesonide (PULMICORT) nebulizer solution  0.5 mg Nebulization BID   Chlorhexidine Gluconate Cloth  6 each Topical Daily   feeding supplement (OSMOLITE 1.2 CAL)  1,000 mL Per Tube Q24H   feeding supplement (PROSource TF20)  60 mL Per Tube Daily   fiber supplement (BANATROL TF)  60 mL Oral BID   heparin injection (subcutaneous)  5,000 Units Subcutaneous Q8H   insulin aspart  0-6 Units Subcutaneous Q4H   niMODipine  60 mg Per Tube Q4H  Or   niMODipine  60 mg Oral Q4H   mouth rinse  15 mL Mouth Rinse 4 times per day   ramelteon  8 mg Oral QHS   sertraline  25 mg Oral Daily   traZODone  50 mg Oral QHS   Continuous:  sodium chloride     sodium chloride 10 mL/hr at 06/19/23 0900    ampicillin-sulbactam (UNASYN) IV 3 g (06/21/23 0331)   ZOX:[id number] **OR** acetaminophen, albuterol, diphenhydrAMINE, labetalol, mouth rinse, oxyCODONE  Antibiotics: Anti-infectives (From admission, onward)    Start     Dose/Rate Route Frequency Ordered Stop   06/20/23 1545  Ampicillin-Sulbactam (UNASYN) 3 g in sodium chloride 0.9 % 100 mL IVPB        3 g 200 mL/hr over 30 Minutes Intravenous Every 12 hours 06/20/23 1446     06/12/23 1130  Ampicillin-Sulbactam (UNASYN) 3 g in sodium chloride 0.9 % 100 mL IVPB  Status:  Discontinued        3 g 200 mL/hr over 30 Minutes Intravenous Every 6 hours 06/12/23 1036 06/13/23 0922       Objective:  Vital Signs  Vitals:   06/20/23 2315 06/20/23 2337 06/21/23 0332 06/21/23 0804  BP: (!) 143/59 (!) 116/54 (!) 115/56 (!) 152/68  Pulse: 89 83 87 90  Resp: (!) 28 (!) 25 (!) 22 17  Temp: 98.5 F (36.9 C)  98.7 F (37.1 C) 99.4 F (37.4 C)  TempSrc: Oral  Oral Oral  SpO2: 100% 100% 98% 98%  Weight:      Height:        Intake/Output Summary (Last 24 hours) at 06/21/2023 0931 Last data filed at 06/21/2023 0000 Gross per 24 hour  Intake 32.8 ml  Output 900 ml  Net -867.2 ml   Filed Weights   06/17/23 0500 06/18/23 0500 06/19/23 0500  Weight: 63.2 kg 63.2 kg 63.2 kg    General appearance: Awake alert.  In no distress.  Not very communicative. Resp: Mildly tachypneic.  Crackles at the right base.  No wheezing or rhonchi. Cardio: S1-S2 is normal regular.  No S3-S4.  No rubs murmurs or bruit GI: Abdomen is soft.  Nontender nondistended.  Bowel sounds are present normal.  No masses organomegaly Extremities: No edema.  Moving all 4 extremities No obvious focal neurological deficits.  Lab Results:  Data Reviewed: I have personally reviewed following labs and reports of the imaging studies  CBC: Recent Labs  Lab 06/15/23 0445 06/17/23 0635 06/18/23 0542 06/20/23 0329 06/21/23 0455  WBC 10.3 11.5* 14.5* 20.2* 30.3*  HGB 9.7*  10.2* 11.9* 12.6 11.9*  HCT 30.0* 33.1* 36.1 38.2 36.4  MCV 89.3 91.4 89.4 87.2 87.5  PLT 160 209 255 306 263    Basic Metabolic Panel: Recent Labs  Lab 06/15/23 0445 06/16/23 0348 06/17/23 0635 06/18/23 0542 06/19/23 1204 06/20/23 0329 06/21/23 0455  NA 144   < > 142 140 139 137 144  K 3.5   < > 3.7 3.6 3.7 3.7 4.0  CL 103   < > 108 100 101 99 106  CO2 25   < > 22 28 26 27 24   GLUCOSE 174*   < > 126* 133* 144* 137* 160*  BUN 23   < > 23 19 43* 49* 46*  CREATININE 0.83   < > 0.58 0.58 1.02* 1.20* 1.03*  CALCIUM 8.2*   < > 8.2* 8.6* 9.2 9.4 9.5  MG 2.2  --   --   --   --   --   --  PHOS 3.2  --   --   --   --   --   --    < > = values in this interval not displayed.    GFR: Estimated Creatinine Clearance: 28.1 mL/min (A) (by C-G formula based on SCr of 1.03 mg/dL (H)).  Liver Function Tests: Recent Labs  Lab 06/20/23 0329  AST 14*  ALT 33  ALKPHOS 75  BILITOT 1.0  PROT 6.9  ALBUMIN 3.1*    CBG: Recent Labs  Lab 06/20/23 1512 06/20/23 1954 06/20/23 2311 06/21/23 0353 06/21/23 0802  GLUCAP 180* 172* 207* 165* 126*     Recent Results (from the past 240 hour(s))  Culture, Respiratory w Gram Stain     Status: None   Collection Time: 06/12/23  9:46 AM   Specimen: Tracheal Aspirate; Respiratory  Result Value Ref Range Status   Specimen Description TRACHEAL ASPIRATE  Final   Special Requests NONE  Final   Gram Stain NO WBC SEEN NO ORGANISMS SEEN   Final   Culture   Final    RARE Normal respiratory flora-no Staph aureus or Pseudomonas seen Performed at [facility], [address] [place], [place] [address]    Report Status 06/14/2023 FINAL  Final      Radiology Studies: VAS [place] TRANSCRANIAL DOPPLER  Result Date: 06/20/2023  Transcranial Doppler Patient Name:  TAHITIA REUTZEL  Date of Exam:   06/19/2023 Medical Rec #: [id number]     Accession #:    [id number] Date of Birth: December 06, 1932     Patient Gender: F Patient Age:   55 years Exam Location:   [facility] Procedure:      VAS [place] TRANSCRANIAL DOPPLER Referring Phys: Maralyn Sago GROCE --------------------------------------------------------------------------------  Indications: Subarachnoid hemorrhage. Comparison Study: Previous exam 06/17/2023 Performing Technologist: Ernestene Mention RVT, RDMS  Examination Guidelines: A complete evaluation includes B-mode imaging, spectral Doppler, color Doppler, and power Doppler as needed of all accessible portions of each vessel. Bilateral testing is considered an integral part of a complete examination. Limited examinations for reoccurring indications may be performed as noted.  +----------+---------------+----------+-----------+-------------+ RIGHT TCD Right VM (cm/s)Depth (cm)Pulsatility   Comment    +----------+---------------+----------+-----------+-------------+ MCA             48                    1.57                  +----------+---------------+----------+-----------+-------------+ ACA                                           not insonated +----------+---------------+----------+-----------+-------------+ Term ICA        27                    1.55                  +----------+---------------+----------+-----------+-------------+ PCA P1          21                    1.54                  +----------+---------------+----------+-----------+-------------+ Opthalmic       17                    2.00                  +----------+---------------+----------+-----------+-------------+  ICA siphon                                    not insonated +----------+---------------+----------+-----------+-------------+ Vertebral       -18                   1.47                  +----------+---------------+----------+-----------+-------------+ Distal ICA      -20                   1.74                  +----------+---------------+----------+-----------+-------------+  +----------+--------------+----------+-----------+-------------+  LEFT TCD  Left VM (cm/s)Depth (cm)Pulsatility   Comment    +----------+--------------+----------+-----------+-------------+ MCA             22                   1.54                  +----------+--------------+----------+-----------+-------------+ ACA                                          not insonated +----------+--------------+----------+-----------+-------------+ Term ICA        25                   1.54                  +----------+--------------+----------+-----------+-------------+ PCA P1          26                   1.57                  +----------+--------------+----------+-----------+-------------+ Opthalmic       16                   1.94                  +----------+--------------+----------+-----------+-------------+ ICA siphon      25                   1.69                  +----------+--------------+----------+-----------+-------------+ Vertebral      -15                   1.24                  +----------+--------------+----------+-----------+-------------+ Distal ICA     -19                   1.60                  +----------+--------------+----------+-----------+-------------+  +------------+-------+-------+             VM cm/sComment +------------+-------+-------+ Prox Basilar  -24   1.64   +------------+-------+-------+ Dist Basilar  -26   1.63   +------------+-------+-------+ +----------------------+----+ Right Lindegaard Ratio2.40 +----------------------+----+ +---------------------+----+ Left Lindegaard Ratio1.16 +---------------------+----+  Summary:  Low mean flow velocities in left middle cerebral artery suggestive of distal stenosis. Globally elevated pulsatility indices likely suggest diffuse intracranial atherosclerosis *See table(s) above for TCD measurements and observations.  Diagnosing physician:  Delia Heady MD Electronically signed by Delia Heady MD on 06/20/2023 at 12:18:39 PM.    Final        LOS: 11  days   Osvaldo Shipper  [facility] Pager on [email or web address]  06/21/2023, 9:31 AM

## 2023-06-22 DIAGNOSIS — E876 Hypokalemia: Secondary | ICD-10-CM

## 2023-06-22 LAB — BASIC METABOLIC PANEL
Anion gap: 15 (ref 5–15)
BUN: 39 mg/dL — ABNORMAL HIGH (ref 8–23)
CO2: 24 mmol/L (ref 22–32)
Calcium: 9.4 mg/dL (ref 8.9–10.3)
Chloride: 105 mmol/L (ref 98–111)
Creatinine, Ser: 0.95 mg/dL (ref 0.44–1.00)
GFR, Estimated: 58 mL/min — ABNORMAL LOW (ref >60)
Glucose, Bld: 168 mg/dL — ABNORMAL HIGH (ref 70–99)
Potassium: 3.4 mmol/L — ABNORMAL LOW (ref 3.5–5.1)
Sodium: 144 mmol/L (ref 135–145)

## 2023-06-22 LAB — CBC
HCT: 38 % (ref 36.0–46.0)
Hemoglobin: 12.2 g/dL (ref 12.0–15.0)
MCH: 29.2 pg (ref 26.0–34.0)
MCHC: 32.1 g/dL (ref 30.0–36.0)
MCV: 90.9 fL (ref 80.0–100.0)
Platelets: 254 10*3/uL (ref 150–400)
RBC: 4.18 MIL/uL (ref 3.87–5.11)
RDW: 14.5 % (ref 11.5–15.5)
WBC: 23.8 10*3/uL — ABNORMAL HIGH (ref 4.0–10.5)
nRBC: 0 % (ref 0.0–0.2)

## 2023-06-22 LAB — GLUCOSE, CAPILLARY
Glucose-Capillary: 104 mg/dL — ABNORMAL HIGH (ref 70–99)
Glucose-Capillary: 115 mg/dL — ABNORMAL HIGH (ref 70–99)
Glucose-Capillary: 122 mg/dL — ABNORMAL HIGH (ref 70–99)
Glucose-Capillary: 132 mg/dL — ABNORMAL HIGH (ref 70–99)
Glucose-Capillary: 143 mg/dL — ABNORMAL HIGH (ref 70–99)
Glucose-Capillary: 154 mg/dL — ABNORMAL HIGH (ref 70–99)

## 2023-06-22 MED ORDER — POTASSIUM CHLORIDE 20 MEQ PO PACK
40.0000 meq | PACK | Freq: Once | ORAL | Status: AC
  Administered 2023-06-22: 40 meq
  Filled 2023-06-22: qty 2

## 2023-06-22 MED ORDER — LOPERAMIDE HCL 1 MG/7.5ML PO SUSP
4.0000 mg | Freq: Once | ORAL | Status: AC
  Administered 2023-06-22: 4 mg
  Filled 2023-06-22: qty 30

## 2023-06-22 MED ORDER — DIPHENHYDRAMINE HCL 12.5 MG/5ML PO ELIX
12.5000 mg | ORAL_SOLUTION | Freq: Three times a day (TID) | ORAL | Status: DC | PRN
  Administered 2023-06-22 – 2023-06-25 (×4): 12.5 mg via ORAL
  Filled 2023-06-22 (×4): qty 5

## 2023-06-22 MED ORDER — LOPERAMIDE HCL 1 MG/7.5ML PO SUSP: 4.0000 mg | Freq: Three times a day (TID) | ORAL | Status: DC | PRN

## 2023-06-22 NOTE — Progress Notes (Signed)
[facility] PROGRESS NOTE   Yolanda [name] ZHY:[id number] DOB: 08/28/1933 DOA: 06/10/2023  PCP: Pcp, No  Brief History/Interval Summary: 89 yo [place] female with a past medical history of asthma, dementia, essential hypertension presented to ER with sudden onset of severe headache, nausea and vomiting. Associated with Rt side weakness, photophobia and facial droop. CT head showed grade 3 SAH, and CTA showed Lt MCA aneurysm, Rt MCA aneurysm, and Rt PCA aneurysm. Neurosurgery consulted and PCCM asked to admit to ICU.  Patient underwent coiling of the left MCA aneurysm.  Patient also needed to be intubated.  She developed stridor after extubation which was treated with racemic epinephrine and Decadron.  Respiratory status improved.  She was transferred to progressive floor.  Consultants: Neurosurgery.  Pulmonology  Procedures: Status post subtotal coil embolization of large left MCA aneurysm    Subjective/Interval History: Both of patient's sons were at the bedside this morning.  They feel that the patient is slightly better compared to yesterday.  She was able to have some applesauce with assistance this morning.  Patient remains noncommunicative but awake.    Assessment/Plan:  Subarachnoid hemorrhage/aneurysm rupture Seen by neurosurgery.  Was initially admitted to ICU. Underwent coil embolization of the left MCA aneurysm. Neurosurgery recommends 21-day course of nimodipine. Seen by PT and OT.  Initially inpatient rehabilitation was recommended.  However patient not able to tolerate high intensity of therapy.  So now skilled nursing facility for rehab was recommended.  Family prefers to take her home. Neurologically she is stable.  Occasional agitation is present.  Acute delirium Off Precedex since 8/27. Patient noted to be on Zoloft trazodone and Rozerem.   Dose of trazodone was increased yesterday.  Nursing staff reports occasional agitation but easily controlled.  Still  requiring restraints though.  Acute on chronic diastolic CHF/acute pulmonary edema/history of essential hypertension/prolonged QTc Echocardiogram showed normal EF.  Was given Lasix x 1 on 8/28.    Aspiration pneumonia Chest x-ray showed right lung opacity.  WBC was noted to be climbing.  She was started on Unasyn on 8/29.  Currently off of oxygen. WBC is improving.  Respiratory status is stable.  Dysphagia Speech therapy is following.  Currently has a cortrak feeding tube.  Currently on dysphagia 1 diet.  Only applesauce occasionally.  Apparently she has no teeth which makes the whole situation even more challenging.  Currently has a core track feeding tube through which she is getting tube feedings.  This will need to be addressed prior to discharge.  May need a PEG tube if her caloric intake remains poor.  Will have nutritionist see the patient as well and consider a calorie count.  Diarrhea Likely due to tube feedings.  Has a rectal tube.  Still having significant output.  Can be given Imodium.  Hypokalemia Will be supplemented.  Recheck labs tomorrow.  Check magnesium level.  History of asthma Stable.  Steroid-induced hyperglycemia Improved since she has completed course of dexamethasone.  Anemia of critical illness Improved.  Leukocytosis Initially due to steroids however started increasing again after she was taken off of steroids send likely due to aspiration pneumonia.  See above.   DVT Prophylaxis: Subcutaneous heparin Code Status: Full code Family Communication: Discussed with sons Disposition Plan: SNF is being recommended now.  Son declines.  Would like to take her home.  Will need to address feeding tube issue before discharge.     Medications: Scheduled:  arformoterol  15 mcg Nebulization BID   bisoprolol  5  mg Oral Daily   budesonide (PULMICORT) nebulizer solution  0.5 mg Nebulization BID   Chlorhexidine Gluconate Cloth  6 each Topical Daily   feeding  supplement (OSMOLITE 1.2 CAL)  1,000 mL Per Tube Q24H   feeding supplement (PROSource TF20)  60 mL Per Tube Daily   fiber supplement (BANATROL TF)  60 mL Oral BID   heparin injection (subcutaneous)  5,000 Units Subcutaneous Q8H   insulin aspart  0-6 Units Subcutaneous Q4H   niMODipine  60 mg Per Tube Q4H   Or   niMODipine  60 mg Oral Q4H   mouth rinse  15 mL Mouth Rinse 4 times per day   potassium chloride  40 mEq Per Tube Once   ramelteon  8 mg Oral QHS   sertraline  25 mg Oral Daily   traZODone  100 mg Oral QHS   Continuous:  sodium chloride     sodium chloride 10 mL/hr at 06/19/23 0900   ampicillin-sulbactam (UNASYN) IV 3 g (06/22/23 0318)   QIO:[id number] **OR** acetaminophen, albuterol, diphenhydrAMINE, labetalol, mouth rinse, oxyCODONE  Antibiotics: Anti-infectives (From admission, onward)    Start     Dose/Rate Route Frequency Ordered Stop   06/20/23 1545  Ampicillin-Sulbactam (UNASYN) 3 g in sodium chloride 0.9 % 100 mL IVPB        3 g 200 mL/hr over 30 Minutes Intravenous Every 12 hours 06/20/23 1446     06/12/23 1130  Ampicillin-Sulbactam (UNASYN) 3 g in sodium chloride 0.9 % 100 mL IVPB  Status:  Discontinued        3 g 200 mL/hr over 30 Minutes Intravenous Every 6 hours 06/12/23 1036 06/13/23 0922       Objective:  Vital Signs  Vitals:   06/22/23 0313 06/22/23 0428 06/22/23 0737 06/22/23 0802  BP: (!) 142/69  (!) 119/53   Pulse: 80  72   Resp: 20  20   Temp: 98.1 F (36.7 C)  98.1 F (36.7 C)   TempSrc: Axillary  Axillary   SpO2: 95%  95% 93%  Weight:  63.2 kg    Height:        Intake/Output Summary (Last 24 hours) at 06/22/2023 0910 Last data filed at 06/22/2023 0400 Gross per 24 hour  Intake 847 ml  Output 1250 ml  Net -403 ml   Filed Weights   06/18/23 0500 06/19/23 0500 06/22/23 0428  Weight: 63.2 kg 63.2 kg 63.2 kg    General appearance: Awake alert.  In no distress. Not very communicative Resp: Improved aeration bilaterally.  Few  crackles at the bases.  Normal effort today. Cardio: S1-S2 is normal regular.  No S3-S4.  No rubs murmurs or bruit GI: Abdomen is soft.  Nontender nondistended.  Bowel sounds are present normal.  No masses organomegaly Extremities: No edema.  Moving all 4 extremities. No obvious focal neurological deficits.  Lab Results:  Data Reviewed: I have personally reviewed following labs and reports of the imaging studies  CBC: Recent Labs  Lab 06/17/23 0635 06/18/23 0542 06/20/23 0329 06/21/23 0455 06/22/23 0423  WBC 11.5* 14.5* 20.2* 30.3* 23.8*  HGB 10.2* 11.9* 12.6 11.9* 12.2  HCT 33.1* 36.1 38.2 36.4 38.0  MCV 91.4 89.4 87.2 87.5 90.9  PLT 209 255 306 263 254    Basic Metabolic Panel: Recent Labs  Lab 06/18/23 0542 06/19/23 1204 06/20/23 0329 06/21/23 0455 06/22/23 0423  NA 140 139 137 144 144  K 3.6 3.7 3.7 4.0 3.4*  CL 100 101 99 106 105  CO2 28 26 27 24 24   GLUCOSE 133* 144* 137* 160* 168*  BUN 19 43* 49* 46* 39*  CREATININE 0.58 1.02* 1.20* 1.03* 0.95  CALCIUM 8.6* 9.2 9.4 9.5 9.4    GFR: Estimated Creatinine Clearance: 30.4 mL/min (by C-G formula based on SCr of 0.95 mg/dL).  Liver Function Tests: Recent Labs  Lab 06/20/23 0329  AST 14*  ALT 33  ALKPHOS 75  BILITOT 1.0  PROT 6.9  ALBUMIN 3.1*    CBG: Recent Labs  Lab 06/21/23 1748 06/21/23 1917 06/21/23 2312 06/22/23 0320 06/22/23 0803  GLUCAP 108* 155* 162* 154* 115*     Recent Results (from the past 240 hour(s))  Culture, Respiratory w Gram Stain     Status: None   Collection Time: 06/12/23  9:46 AM   Specimen: Tracheal Aspirate; Respiratory  Result Value Ref Range Status   Specimen Description TRACHEAL ASPIRATE  Final   Special Requests NONE  Final   Gram Stain NO WBC SEEN NO ORGANISMS SEEN   Final   Culture   Final    RARE Normal respiratory flora-no Staph aureus or Pseudomonas seen Performed at [facility], [address], [place], [place] [address]    Report Status  06/14/2023 FINAL  Final      Radiology Studies: VAS [place] TRANSCRANIAL DOPPLER  Result Date: 06/21/2023  Transcranial Doppler Patient Name:  Yolanda [name]  Date of Exam:   06/21/2023 Medical Rec #: [id number]     Accession #:    [id number] Date of Birth: Mar 24, 1933     Patient Gender: F Patient Age:   59 years Exam Location:  [facility] Procedure:      VAS [place] TRANSCRANIAL DOPPLER Referring Phys: Maralyn Sago GROCE --------------------------------------------------------------------------------  Indications: Subarachnoid hemorrhage. Limitations: Today's examination was technically difficult. Constant patient              movement/patient agitated. Performing Technologist: Jean Rosenthal RDMS, RVT  Examination Guidelines: A complete evaluation includes B-mode imaging, spectral Doppler, color Doppler, and power Doppler as needed of all accessible portions of each vessel. Bilateral testing is considered an integral part of a complete examination. Limited examinations for reoccurring indications may be performed as noted.  +----------+---------------+----------+-----------+------------------+ RIGHT TCD Right VM (cm/s)Depth (cm)Pulsatility     Comment       +----------+---------------+----------+-----------+------------------+ MCA             48                    1.44                       +----------+---------------+----------+-----------+------------------+ ACA             -28                   1.25                       +----------+---------------+----------+-----------+------------------+ Term ICA        23                    1.52                       +----------+---------------+----------+-----------+------------------+ PCA P1          17                    1.50                       +----------+---------------+----------+-----------+------------------+  Opthalmic       24                    1.68                        +----------+---------------+----------+-----------+------------------+ ICA siphon                                    Unable to insonate +----------+---------------+----------+-----------+------------------+ Vertebral       -16                   1.66                       +----------+---------------+----------+-----------+------------------+ Distal ICA      21                                               +----------+---------------+----------+-----------+------------------+  +----------+--------------+----------+-----------+-------+ LEFT TCD  Left VM (cm/s)Depth (cm)PulsatilityComment +----------+--------------+----------+-----------+-------+ MCA             41                                   +----------+--------------+----------+-----------+-------+ ACA            -27                   1.53            +----------+--------------+----------+-----------+-------+ Term ICA        40                   1.23            +----------+--------------+----------+-----------+-------+ PCA P1         -24                   1.44            +----------+--------------+----------+-----------+-------+ Opthalmic       20                   1.85            +----------+--------------+----------+-----------+-------+ ICA siphon      32                   1.51            +----------+--------------+----------+-----------+-------+ Vertebral      -16                   1.28            +----------+--------------+----------+-----------+-------+ Distal ICA      14                                   +----------+--------------+----------+-----------+-------+  +------------+-------+-------+             VM cm/sComment +------------+-------+-------+ Prox Basilar  -25          +------------+-------+-------+ Dist Basilar  -27          +------------+-------+-------+ +----------------------+---+ Right Lindegaard Ratio2.3 +----------------------+---+  +---------------------+---+ Left Lindegaard Ratio2.9 +---------------------+---+  Preliminary        LOS: 12 days   Trenee Igoe [employer] on [email or web address]  06/22/2023, 9:10 AM

## 2023-06-22 NOTE — Progress Notes (Signed)
Speech Language Pathology Treatment: Dysphagia  Patient Details Name: Yolanda [name] MRN: [id number] DOB: [birth year] Today's Date: 06/22/2023 Time: 1239-1300 SLP Time Calculation (min) (ACUTE ONLY): 21 min  Assessment / Plan / Recommendation Clinical Impression  Pt seen for skilled dysphagia tx this date per MD request in order to determine diet toleration/ability to advance solids prior to possible cortrak removal. Son at bedside provided interpretation during session per his request. In an upright position, pt assisted with sips of thin liquids by straw (single and consecutive) and bites of puree and simulated dys 2 (minced/ground) solids. No clinical signs of aspiration noted across consistencies, however AP transit and oral manipulation remains prolonged. Pt required moderate multimodal cueing to initiate mastication of dys 2 solid, which she did perform, though max diffuse oral residuals remained. Moderate cueing for liquid wash did clear oral cavity completely, but concerned that pt did not show any initiation of solid manipulation or awareness of residue. Pureed solids were manipulated and cleared with minimal cueing, no liquid washes required although did assist with moments of oral holding of solids. Pt attention was poor and frequent redirection needed this date. Recommend continue dys 1 diet/thin liquids with full supervision. Will continue f/u for ongoing trials of advanced solids and education of family as indicated.    HPI HPI: 89 year old female with history of mild asthma and HTN presents 06/10/2023 to the ED with acute onset of severe headache.  CT scan demonstrates evidence of a diffuse Grade 3 subarachnoid hemorrhage more prominent in her left sylvian fissure. 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 Pt was intubated 8/21-8/23.  Pt completed a BSE on 8/20, but was consulted for re-evaluation secondary  to acute respiratory distress and intubation.      SLP Plan  Continue with current plan of care      Recommendations for follow up therapy are one component of a multi-disciplinary discharge planning process, led by the attending physician.  Recommendations may be updated based on patient status, additional functional criteria and insurance authorization.    Recommendations  Diet recommendations: Dysphagia 1 (puree);Thin liquid Liquids provided via: Straw;Cup Medication Administration: Crushed with puree Supervision: Trained caregiver to feed patient;Full supervision/cueing for compensatory strategies Compensations: Minimize environmental distractions;Slow rate;Small sips/bites;Lingual sweep for clearance of pocketing;Follow solids with liquid Postural Changes and/or Swallow Maneuvers: Seated upright 90 degrees                  Oral care BID   Frequent or constant Supervision/Assistance Dysphagia, oropharyngeal phase (R13.12)     Continue with current plan of care      Avie Echevaria, MA, CCC-SLP Acute Rehabilitation Services Office Number: [phone]  Paulette Blanch  06/22/2023, 1:07 PM

## 2023-06-23 LAB — GLUCOSE, CAPILLARY
Glucose-Capillary: 130 mg/dL — ABNORMAL HIGH (ref 70–99)
Glucose-Capillary: 159 mg/dL — ABNORMAL HIGH (ref 70–99)
Glucose-Capillary: 129 mg/dL — ABNORMAL HIGH (ref 70–99)
Glucose-Capillary: 120 mg/dL — ABNORMAL HIGH (ref 70–99)
Glucose-Capillary: 143 mg/dL — ABNORMAL HIGH (ref 70–99)
Glucose-Capillary: 126 mg/dL — ABNORMAL HIGH (ref 70–99)

## 2023-06-23 LAB — BASIC METABOLIC PANEL
Anion gap: 11 (ref 5–15)
BUN: 35 mg/dL — ABNORMAL HIGH (ref 8–23)
CO2: 21 mmol/L — ABNORMAL LOW (ref 22–32)
Calcium: 8.6 mg/dL — ABNORMAL LOW (ref 8.9–10.3)
Chloride: 106 mmol/L (ref 98–111)
Creatinine, Ser: 1.08 mg/dL — ABNORMAL HIGH (ref 0.44–1.00)
GFR, Estimated: 49 mL/min — ABNORMAL LOW (ref >60)
Glucose, Bld: 167 mg/dL — ABNORMAL HIGH (ref 70–99)
Potassium: 3.9 mmol/L (ref 3.5–5.1)
Sodium: 138 mmol/L (ref 135–145)

## 2023-06-23 LAB — CBC
HCT: 33.9 % — ABNORMAL LOW (ref 36.0–46.0)
Hemoglobin: 10.8 g/dL — ABNORMAL LOW (ref 12.0–15.0)
MCH: 28.4 pg (ref 26.0–34.0)
MCHC: 31.9 g/dL (ref 30.0–36.0)
MCV: 89.2 fL (ref 80.0–100.0)
Platelets: 251 10*3/uL (ref 150–400)
RBC: 3.8 MIL/uL — ABNORMAL LOW (ref 3.87–5.11)
RDW: 14.6 % (ref 11.5–15.5)
WBC: 19 10*3/uL — ABNORMAL HIGH (ref 4.0–10.5)
nRBC: 0 % (ref 0.0–0.2)

## 2023-06-23 LAB — MAGNESIUM: Magnesium: 2.1 mg/dL (ref 1.7–2.4)

## 2023-06-23 NOTE — Plan of Care (Signed)
  Problem: Education: Goal: Knowledge of disease or condition will improve Outcome: Progressing Goal: Knowledge of secondary prevention will improve (MUST DOCUMENT ALL) Outcome: Progressing Goal: Knowledge of patient specific risk factors will improve Loraine Leriche N/A or DELETE if not current risk factor) Outcome: Progressing   Problem: Spontaneous Subarachnoid Hemorrhage Tissue Perfusion: Goal: Complications of Spontaneous Subarachnoid Hemorrhage will be minimized Outcome: Progressing   Problem: Coping: Goal: Will verbalize positive feelings about self Outcome: Progressing Goal: Will identify appropriate support needs Outcome: Progressing   Problem: Health Behavior/Discharge Planning: Goal: Ability to manage health-related needs will improve Outcome: Progressing Goal: Goals will be collaboratively established with patient/family Outcome: Progressing   Problem: Self-Care: Goal: Ability to participate in self-care as condition permits will improve Outcome: Progressing Goal: Verbalization of feelings and concerns over difficulty with self-care will improve Outcome: Progressing Goal: Ability to communicate needs accurately will improve Outcome: Progressing   Problem: Nutrition: Goal: Risk of aspiration will decrease Outcome: Progressing Goal: Dietary intake will improve Outcome: Progressing   Problem: Safety: Goal: Non-violent Restraint(s) Outcome: Progressing   Problem: Education: Goal: Knowledge of General Education information will improve Description: Including pain rating scale, medication(s)/side effects and non-pharmacologic comfort measures Outcome: Progressing   Problem: Health Behavior/Discharge Planning: Goal: Ability to manage health-related needs will improve Outcome: Progressing   Problem: Clinical Measurements: Goal: Ability to maintain clinical measurements within normal limits will improve Outcome: Progressing Goal: Will remain free from  infection Outcome: Progressing Goal: Diagnostic test results will improve Outcome: Progressing Goal: Respiratory complications will improve Outcome: Progressing Goal: Cardiovascular complication will be avoided Outcome: Progressing   Problem: Activity: Goal: Risk for activity intolerance will decrease Outcome: Progressing   Problem: Nutrition: Goal: Adequate nutrition will be maintained Outcome: Progressing   Problem: Coping: Goal: Level of anxiety will decrease Outcome: Progressing   Problem: Elimination: Goal: Will not experience complications related to bowel motility Outcome: Progressing Goal: Will not experience complications related to urinary retention Outcome: Progressing   Problem: Pain Managment: Goal: General experience of comfort will improve Outcome: Progressing   Problem: Safety: Goal: Ability to remain free from injury will improve Outcome: Progressing   Problem: Skin Integrity: Goal: Risk for impaired skin integrity will decrease Outcome: Progressing

## 2023-06-23 NOTE — Progress Notes (Signed)
[facility] PROGRESS NOTE   Yolanda [name] YTK:[id number] DOB: 06-01-1933 DOA: 06/10/2023  PCP: Pcp, No  Brief History/Interval Summary: 89 yo [place] female with a past medical history of asthma, dementia, essential hypertension presented to ER with sudden onset of severe headache, nausea and vomiting. Associated with Rt side weakness, photophobia and facial droop. CT head showed grade 3 SAH, and CTA showed Lt MCA aneurysm, Rt MCA aneurysm, and Rt PCA aneurysm. Neurosurgery consulted and PCCM asked to admit to ICU.  Patient underwent coiling of the left MCA aneurysm.  Patient also needed to be intubated.  She developed stridor after extubation which was treated with racemic epinephrine and Decadron.  Respiratory status improved.  She was transferred to progressive floor.  Consultants: Neurosurgery.  Pulmonology  Procedures: Status post subtotal coil embolization of large left MCA aneurysm    Subjective/Interval History: Patient's son was at the bedside this morning.  He mentioned that she seems to be doing better with consuming her meals.  He was assisting with giving her breakfast.  No complaints offered.     Assessment/Plan:  Subarachnoid hemorrhage/aneurysm rupture Seen by neurosurgery.  Was initially admitted to ICU. Underwent coil embolization of the left MCA aneurysm. Neurosurgery recommends 21-day course of nimodipine which will end on 07/01/2023. Seen by PT and OT.  Initially inpatient rehabilitation was recommended.  However patient not able to tolerate high intensity of therapy.  So now skilled nursing facility for rehab was recommended.  Family prefers to take her home. Neurologically she is stable.    Acute delirium Off Precedex since 8/27. Patient noted to be on Zoloft trazodone and Rozerem.   Dose of trazodone was increased.  Occasional agitation is present.  Hopefully she can come off of her restraints soon.  Acute on chronic diastolic CHF/acute pulmonary  edema/history of essential hypertension/prolonged QTc Echocardiogram showed normal EF.  Was given Lasix x 1 on 8/28.  No further need for diuretics.    Aspiration pneumonia Chest x-ray showed right lung opacity.  WBC was noted to be climbing.   She was started on Unasyn on 8/29.   Respiratory status is stable.  Remains off of oxygen.  WBC is improving.  Continue Unasyn for another day or 2 then hopefully transition to oral antibiotics.  Plan for a 7-day course.    Dysphagia Patient has a core track feeding tube through which she is getting Osmolite. Speech therapy has been following.  Currently on a dysphagia 1 diet. Apparently she has no teeth which makes the whole situation even more challenging.   Seems to be doing better with the pured diet this morning.  Son is assisting her. Will request calorie count.   Hopefully we can avoid PEG tube and core track can be pulled out in the next 2 to 3 days.    Diarrhea Likely due to tube feedings.  Has a rectal tube.  Still having significant output.  Can be given Imodium. Discontinue Flexi-Seal when stool output is decreased.  Hypokalemia Supplemented.  Magnesium 2.1.    History of asthma Stable.  Steroid-induced hyperglycemia Improved since she has completed course of dexamethasone.  Anemia of critical illness Improved.  Leukocytosis Initially due to steroids however started increasing again after she was taken off of steroids send likely due to aspiration pneumonia.  See above.   DVT Prophylaxis: Subcutaneous heparin Code Status: Full code Family Communication: Discussed with sons Disposition Plan: SNF is being recommended now.  Son declines.  Would like to take her home.  Will need to address feeding tube issue before discharge.     Medications: Scheduled:  arformoterol  15 mcg Nebulization BID   bisoprolol  5 mg Oral Daily   budesonide (PULMICORT) nebulizer solution  0.5 mg Nebulization BID   Chlorhexidine Gluconate Cloth   6 each Topical Daily   feeding supplement (OSMOLITE 1.2 CAL)  1,000 mL Per Tube Q24H   feeding supplement (PROSource TF20)  60 mL Per Tube Daily   fiber supplement (BANATROL TF)  60 mL Oral BID   heparin injection (subcutaneous)  5,000 Units Subcutaneous Q8H   insulin aspart  0-6 Units Subcutaneous Q4H   niMODipine  60 mg Per Tube Q4H   Or   niMODipine  60 mg Oral Q4H   mouth rinse  15 mL Mouth Rinse 4 times per day   ramelteon  8 mg Oral QHS   sertraline  25 mg Oral Daily   traZODone  100 mg Oral QHS   Continuous:  sodium chloride     sodium chloride 10 mL/hr at 06/19/23 0900   ampicillin-sulbactam (UNASYN) IV 3 g (06/23/23 0321)   ION:[id number] **OR** acetaminophen, albuterol, diphenhydrAMINE, labetalol, loperamide HCl, mouth rinse, oxyCODONE  Antibiotics: Anti-infectives (From admission, onward)    Start     Dose/Rate Route Frequency Ordered Stop   06/20/23 1545  Ampicillin-Sulbactam (UNASYN) 3 g in sodium chloride 0.9 % 100 mL IVPB        3 g 200 mL/hr over 30 Minutes Intravenous Every 12 hours 06/20/23 1446     06/12/23 1130  Ampicillin-Sulbactam (UNASYN) 3 g in sodium chloride 0.9 % 100 mL IVPB  Status:  Discontinued        3 g 200 mL/hr over 30 Minutes Intravenous Every 6 hours 06/12/23 1036 06/13/23 0922       Objective:  Vital Signs  Vitals:   06/23/23 0500 06/23/23 0803 06/23/23 0829 06/23/23 0830  BP:  (!) 120/56    Pulse:      Resp:  (!) 24    Temp:  98 F (36.7 C)    TempSrc:  Tympanic    SpO2:   98% 98%  Weight: 63.2 kg     Height:        Intake/Output Summary (Last 24 hours) at 06/23/2023 0913 Last data filed at 06/23/2023 0600 Gross per 24 hour  Intake 1113 ml  Output 1050 ml  Net 63 ml   Filed Weights   06/19/23 0500 06/22/23 0428 06/23/23 0500  Weight: 63.2 kg 63.2 kg 63.2 kg    General appearance: Awake alert.  In no distress Resp: Clear to auscultation bilaterally.  Normal effort Cardio: S1-S2 is normal regular.  No S3-S4.  No  rubs murmurs or bruit GI: Abdomen is soft.  Nontender nondistended.  Bowel sounds are present normal.  No masses organomegaly   Lab Results:  Data Reviewed: I have personally reviewed following labs and reports of the imaging studies  CBC: Recent Labs  Lab 06/18/23 0542 06/20/23 0329 06/21/23 0455 06/22/23 0423 06/23/23 0429  WBC 14.5* 20.2* 30.3* 23.8* 19.0*  HGB 11.9* 12.6 11.9* 12.2 10.8*  HCT 36.1 38.2 36.4 38.0 33.9*  MCV 89.4 87.2 87.5 90.9 89.2  PLT 255 306 263 254 251    Basic Metabolic Panel: Recent Labs  Lab 06/19/23 1204 06/20/23 0329 06/21/23 0455 06/22/23 0423 06/23/23 0429  NA 139 137 144 144 138  K 3.7 3.7 4.0 3.4* 3.9  CL 101 99 106 105 106  CO2 26 27 24  24 21*  GLUCOSE 144* 137* 160* 168* 167*  BUN 43* 49* 46* 39* 35*  CREATININE 1.02* 1.20* 1.03* 0.95 1.08*  CALCIUM 9.2 9.4 9.5 9.4 8.6*  MG  --   --   --   --  2.1    GFR: Estimated Creatinine Clearance: 26.8 mL/min (A) (by C-G formula based on SCr of 1.08 mg/dL (H)).  Liver Function Tests: Recent Labs  Lab 06/20/23 0329  AST 14*  ALT 33  ALKPHOS 75  BILITOT 1.0  PROT 6.9  ALBUMIN 3.1*    CBG: Recent Labs  Lab 06/22/23 1611 06/22/23 1955 06/22/23 2339 06/23/23 0315 06/23/23 0816  GLUCAP 122* 132* 143* 130* 159*     No results found for this or any previous visit (from the past 240 hour(s)).     Radiology Studies: VAS [place] TRANSCRANIAL DOPPLER  Result Date: 06/21/2023  Transcranial Doppler Patient Name:  Yolanda [name]  Date of Exam:   06/21/2023 Medical Rec #: [id number]     Accession #:    [id number] Date of Birth: 11-Nov-1932     Patient Gender: F Patient Age:   83 years Exam Location:  [facility] Procedure:      VAS [place] TRANSCRANIAL DOPPLER Referring Phys: Maralyn Sago GROCE --------------------------------------------------------------------------------  Indications: Subarachnoid hemorrhage. Limitations: Today's examination was technically difficult. Constant patient               movement/patient agitated. Performing Technologist: Jean Rosenthal RDMS, RVT  Examination Guidelines: A complete evaluation includes B-mode imaging, spectral Doppler, color Doppler, and power Doppler as needed of all accessible portions of each vessel. Bilateral testing is considered an integral part of a complete examination. Limited examinations for reoccurring indications may be performed as noted.  +----------+---------------+----------+-----------+------------------+ RIGHT TCD Right VM (cm/s)Depth (cm)Pulsatility     Comment       +----------+---------------+----------+-----------+------------------+ MCA             48                    1.44                       +----------+---------------+----------+-----------+------------------+ ACA             -28                   1.25                       +----------+---------------+----------+-----------+------------------+ Term ICA        23                    1.52                       +----------+---------------+----------+-----------+------------------+ PCA P1          17                    1.50                       +----------+---------------+----------+-----------+------------------+ Opthalmic       24                    1.68                       +----------+---------------+----------+-----------+------------------+ ICA siphon  Unable to insonate +----------+---------------+----------+-----------+------------------+ Vertebral       -16                   1.66                       +----------+---------------+----------+-----------+------------------+ Distal ICA      21                                               +----------+---------------+----------+-----------+------------------+  +----------+--------------+----------+-----------+-------+ LEFT TCD  Left VM (cm/s)Depth (cm)PulsatilityComment +----------+--------------+----------+-----------+-------+ MCA              41                                   +----------+--------------+----------+-----------+-------+ ACA            -27                   1.53            +----------+--------------+----------+-----------+-------+ Term ICA        40                   1.23            +----------+--------------+----------+-----------+-------+ PCA P1         -24                   1.44            +----------+--------------+----------+-----------+-------+ Opthalmic       20                   1.85            +----------+--------------+----------+-----------+-------+ ICA siphon      32                   1.51            +----------+--------------+----------+-----------+-------+ Vertebral      -16                   1.28            +----------+--------------+----------+-----------+-------+ Distal ICA      14                                   +----------+--------------+----------+-----------+-------+  +------------+-------+-------+             VM cm/sComment +------------+-------+-------+ Prox Basilar  -25          +------------+-------+-------+ Dist Basilar  -27          +------------+-------+-------+ +----------------------+---+ Right Lindegaard Ratio2.3 +----------------------+---+ +---------------------+---+ Left Lindegaard Ratio2.9 +---------------------+---+    Preliminary        LOS: 13 days   Audia Amick [employer] on [email or web address]  06/23/2023, 9:13 AM

## 2023-06-24 LAB — CBC
HCT: 33.5 % — ABNORMAL LOW (ref 36.0–46.0)
Hemoglobin: 10.6 g/dL — ABNORMAL LOW (ref 12.0–15.0)
MCH: 28.4 pg (ref 26.0–34.0)
MCHC: 31.6 g/dL (ref 30.0–36.0)
MCV: 89.8 fL (ref 80.0–100.0)
Platelets: 282 10*3/uL (ref 150–400)
RBC: 3.73 MIL/uL — ABNORMAL LOW (ref 3.87–5.11)
RDW: 14.5 % (ref 11.5–15.5)
WBC: 15.4 10*3/uL — ABNORMAL HIGH (ref 4.0–10.5)
nRBC: 0 % (ref 0.0–0.2)

## 2023-06-24 LAB — GLUCOSE, CAPILLARY
Glucose-Capillary: 130 mg/dL — ABNORMAL HIGH (ref 70–99)
Glucose-Capillary: 94 mg/dL (ref 70–99)
Glucose-Capillary: 210 mg/dL — ABNORMAL HIGH (ref 70–99)
Glucose-Capillary: 102 mg/dL — ABNORMAL HIGH (ref 70–99)
Glucose-Capillary: 129 mg/dL — ABNORMAL HIGH (ref 70–99)
Glucose-Capillary: 125 mg/dL — ABNORMAL HIGH (ref 70–99)

## 2023-06-24 MED ORDER — LOPERAMIDE HCL 1 MG/7.5ML PO SUSP
4.0000 mg | Freq: Three times a day (TID) | ORAL | Status: DC | PRN
  Administered 2023-06-25 – 2023-06-26 (×2): 4 mg
  Filled 2023-06-24 (×3): qty 30

## 2023-06-24 MED ORDER — LOPERAMIDE HCL 1 MG/7.5ML PO SUSP
4.0000 mg | Freq: Three times a day (TID) | ORAL | Status: AC
  Administered 2023-06-24 (×3): 4 mg
  Filled 2023-06-24 (×4): qty 30

## 2023-06-24 NOTE — Progress Notes (Signed)
Physical Therapy Treatment Patient Details Name: Yolanda [name] MRN: [id number] DOB: 11-Nov-1932 Today's Date: 06/24/2023   History of Present Illness Pt is an 89 y.o. female who presented 06/10/23 with acute onset of severe headache and R-sided weakness. CT scan demonstrates evidence of a diffuse subarachnoid hemorrhage more prominent in her left sylvian fissure. 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 S/p coil embolization of L MCA aneurysm 8/19. Intubated 8/20. Cortrak placed 8/21. PMH: dementia, asthma, OA, HTN    PT Comments  Pt received in bed, awake and alert. No video interpreter available on holiday but son in law present and able to interpret simple commands which pt followed 25-50% of time.  Pt needed mod A to come to sitting EOB. Sat without LOB in static sitting >5 mins. Pt stood with mod A +2 and was incontinent of urine and rectal tube was leaking. Pt performed multiple sit>stand for cleanup and was then very fatigued and could not tolerate gait progression. Pt did follow cues for simple LE there ex in chair. PT will continue to follow.    If plan is discharge home, recommend the following: Two people to help with walking and/or transfers;Two people to help with bathing/dressing/bathroom;Assistance with cooking/housework;Direct supervision/assist for medications management;Direct supervision/assist for financial management;Assistance with feeding;Assist for transportation;Help with stairs or ramp for entrance;Supervision due to cognitive status   Can travel by private vehicle     No  Equipment Recommendations  Other (comment) (defer to next venue)    Recommendations for Other Services       Precautions / Restrictions Precautions Precautions: Fall Precaution Comments: posey, Lt mitten,  SBP goal <150; cortrak Restrictions Weight Bearing Restrictions: No     Mobility  Bed  Mobility Overal bed mobility: Needs Assistance Bed Mobility: Rolling, Sit to Supine Rolling: Mod assist   Supine to sit: Mod assist     General bed mobility comments: mod A for LE's off bed and elevation of trunk into sitting. Close guarding in sitting EOB but pt able to maintain >5 mins without LOB without perturbation    Transfers Overall transfer level: Needs assistance Equipment used: Ambulation equipment used Transfers: Sit to/from Stand Sit to Stand: +2 safety/equipment, +2 physical assistance, Mod assist           General transfer comment: pt incontinent of urine and rectal tube leaking when pt stood so needed to be cleaned up in standing which fatigued her and then limited her ability to tolerate further mobility progression    Ambulation/Gait               General Gait Details: fatigued after mutliple sit>stand for clean up   Stairs             Wheelchair Mobility     Tilt Bed    Modified Rankin (Stroke Patients Only) Modified Rankin (Stroke Patients Only) Pre-Morbid Rankin Score: Slight disability Modified Rankin: Severe disability     Balance Overall balance assessment: Needs assistance Sitting-balance support: Single extremity supported, Feet supported Sitting balance-Leahy Scale: Poor Sitting balance - Comments: progressing to fair Postural control: Right lateral lean Standing balance support: Bilateral upper extremity supported, During functional activity Standing balance-Leahy Scale: Poor Standing balance comment: max A needed to maintain standing                            Cognition Arousal: Alert Behavior During  Therapy: Flat affect Overall Cognitive Status: Impaired/Different from baseline Area of Impairment: Attention, Following commands, Safety/judgement, Awareness, Problem solving, Memory                   Current Attention Level: Focused Memory: Decreased short-term memory Following Commands: Follows  one step commands with increased time, Follows one step commands inconsistently Safety/Judgement: Decreased awareness of safety, Decreased awareness of deficits Awareness: Intellectual Problem Solving: Slow processing, Difficulty sequencing, Requires verbal cues, Requires tactile cues, Decreased initiation General Comments: pt more alert today and following 50%of basic commands from son in law.        Exercises General Exercises - Lower Extremity Ankle Circles/Pumps: AROM, Both, 10 reps, Seated Quad Sets: AAROM, Both, 10 reps, Seated    General Comments General comments (skin integrity, edema, etc.): VSS on RA      Pertinent Vitals/Pain Pain Assessment Pain Assessment: Faces Faces Pain Scale: Hurts a little bit Pain Location: generalized Pain Descriptors / Indicators: Discomfort, Grimacing, Moaning Pain Intervention(s): Limited activity within patient's tolerance, Monitored during session    Home Living                          Prior Function            PT Goals (current goals can now be found in the care plan section) Acute Rehab PT Goals Patient Stated Goal: son in law hoping for coretrack to be removed PT Goal Formulation: With patient/family Time For Goal Achievement: 06/27/23 Potential to Achieve Goals: Fair Progress towards PT goals: Progressing toward goals    Frequency    Min 1X/week      PT Plan      Co-evaluation              AM-PAC PT "6 Clicks" Mobility   Outcome Measure  Help needed turning from your back to your side while in a flat bed without using bedrails?: A Lot Help needed moving from lying on your back to sitting on the side of a flat bed without using bedrails?: Total Help needed moving to and from a bed to a chair (including a wheelchair)?: Total Help needed standing up from a chair using your arms (e.g., wheelchair or bedside chair)?: Total Help needed to walk in hospital room?: Total Help needed climbing 3-5 steps  with a railing? : Total 6 Click Score: 7    End of Session Equipment Utilized During Treatment: Gait belt Activity Tolerance: Patient tolerated treatment well Patient left: with call bell/phone within reach;with family/visitor present;in chair;with chair alarm set;with restraints reapplied Nurse Communication: Mobility status;Other (comment) (rectal tube leaking) PT Visit Diagnosis: Muscle weakness (generalized) (M62.81);Difficulty in walking, not elsewhere classified (R26.2);Other symptoms and signs involving the nervous system (R29.898)     Time: 5284-1324 PT Time Calculation (min) (ACUTE ONLY): 25 min  Charges:    $Therapeutic Exercise: 23-37 mins PT General Charges $$ ACUTE PT VISIT: 1 Visit                     Lyanne Co, PT  Acute Rehab Services Secure chat preferred Office [phone]    Lawana Chambers Brynnley Dayrit 06/24/2023, 10:44 AM

## 2023-06-24 NOTE — Progress Notes (Signed)
[facility] PROGRESS NOTE   Yolanda [name] NFA:[id number] DOB: June 16, 1933 DOA: 06/10/2023  PCP: Pcp, No  Brief History/Interval Summary: 89 yo [place] female with a past medical history of asthma, dementia, essential hypertension presented to ER with sudden onset of severe headache, nausea and vomiting. Associated with Rt side weakness, photophobia and facial droop. CT head showed grade 3 SAH, and CTA showed Lt MCA aneurysm, Rt MCA aneurysm, and Rt PCA aneurysm. Neurosurgery consulted and PCCM asked to admit to ICU.  Patient underwent coiling of the left MCA aneurysm.  Patient also needed to be intubated.  She developed stridor after extubation which was treated with racemic epinephrine and Decadron.  Respiratory status improved.  She was transferred to progressive floor.  Consultants: Neurosurgery.  Pulmonology  Procedures: Status post subtotal coil embolization of large left MCA aneurysm    Subjective/Interval History: Patient's son-in-law at the bedside.  Patient more awake alert than she has been.  Patient's son-in-law mentioned that she ate better yesterday.    Assessment/Plan:  Subarachnoid hemorrhage/aneurysm rupture Seen by neurosurgery.  Was initially admitted to ICU. Underwent coil embolization of the left MCA aneurysm. Neurosurgery recommends 21-day course of nimodipine which will end on 07/01/2023. Seen by PT and OT.  Initially inpatient rehabilitation was recommended.  However patient not able to tolerate high intensity of therapy.  So now skilled nursing facility for rehab was recommended.  Family prefers to take her home. Neurologically she is stable.    Acute delirium Off Precedex since 8/27. Patient noted to be on Zoloft trazodone and Rozerem.   Dose of trazodone was increased.   Occasional agitation is present.  Hopefully she can come off of her restraints soon.  Acute on chronic diastolic CHF/acute pulmonary edema/history of essential hypertension/prolonged  QTc Echocardiogram showed normal EF.  Was given Lasix x 1 on 8/28.  No further need for diuretics.    Aspiration pneumonia Chest x-ray showed right lung opacity.  WBC was noted to be climbing.   She was started on Unasyn on 8/29.   Respiratory status is stable.  Remains off of oxygen.   Planning for a 7-day course of antibiotics.  Hopefully Unasyn can be transition to oral antibiotics soon. WBC continues to improve.    Dysphagia Patient has a core track feeding tube through which she is getting Osmolite. Speech therapy has been following.  Currently on a dysphagia 1 diet. Apparently she has no teeth which makes the whole situation even more challenging.   Son-in-law thinks that she is doing much better with the eating pured Diet. Nursing staff to document meal intake today.  Calorie count has also been ordered.   Hopefully we can avoid PEG tube and core track can be pulled out in the next 2 to 3 days.    Diarrhea Likely due to tube feedings.  Has a rectal tube.  Nursing staff to consider removing later today depending on stool output.  Imodium can be given as needed.    Hypokalemia Supplemented.  Magnesium 2.1.    History of asthma Stable.  Steroid-induced hyperglycemia Improved since she has completed course of dexamethasone.  Anemia of critical illness Improved.  Leukocytosis Initially due to steroids however started increasing again after she was taken off of steroids send likely due to aspiration pneumonia.  See above.   DVT Prophylaxis: Subcutaneous heparin Code Status: Full code Family Communication: Discussed with sons Disposition Plan: SNF is being recommended now.  Son declines.  Would like to take her home.  Will need to address feeding tube issue before discharge.     Medications: Scheduled:  arformoterol  15 mcg Nebulization BID   bisoprolol  5 mg Oral Daily   budesonide (PULMICORT) nebulizer solution  0.5 mg Nebulization BID   Chlorhexidine Gluconate  Cloth  6 each Topical Daily   feeding supplement (OSMOLITE 1.2 CAL)  1,000 mL Per Tube Q24H   feeding supplement (PROSource TF20)  60 mL Per Tube Daily   fiber supplement (BANATROL TF)  60 mL Oral BID   heparin injection (subcutaneous)  5,000 Units Subcutaneous Q8H   insulin aspart  0-6 Units Subcutaneous Q4H   niMODipine  60 mg Per Tube Q4H   Or   niMODipine  60 mg Oral Q4H   mouth rinse  15 mL Mouth Rinse 4 times per day   ramelteon  8 mg Oral QHS   sertraline  25 mg Oral Daily   traZODone  100 mg Oral QHS   Continuous:  sodium chloride     sodium chloride 10 mL/hr at 06/19/23 0900   ampicillin-sulbactam (UNASYN) IV 3 g (06/24/23 0338)   URK:[id number] **OR** acetaminophen, albuterol, diphenhydrAMINE, labetalol, loperamide HCl, mouth rinse, oxyCODONE  Antibiotics: Anti-infectives (From admission, onward)    Start     Dose/Rate Route Frequency Ordered Stop   06/20/23 1545  Ampicillin-Sulbactam (UNASYN) 3 g in sodium chloride 0.9 % 100 mL IVPB        3 g 200 mL/hr over 30 Minutes Intravenous Every 12 hours 06/20/23 1446     06/12/23 1130  Ampicillin-Sulbactam (UNASYN) 3 g in sodium chloride 0.9 % 100 mL IVPB  Status:  Discontinued        3 g 200 mL/hr over 30 Minutes Intravenous Every 6 hours 06/12/23 1036 06/13/23 0922       Objective:  Vital Signs  Vitals:   06/24/23 0404 06/24/23 0733 06/24/23 0820 06/24/23 0821  BP:  133/65    Pulse:  75    Resp:  (!) 22    Temp:  98.6 F (37 C)    TempSrc:  Oral    SpO2:  95% 97% 96%  Weight: 63.2 kg     Height:        Intake/Output Summary (Last 24 hours) at 06/24/2023 0851 Last data filed at 06/24/2023 0745 Gross per 24 hour  Intake 2469.33 ml  Output 1735 ml  Net 734.33 ml   Filed Weights   06/22/23 0428 06/23/23 0500 06/24/23 0404  Weight: 63.2 kg 63.2 kg 63.2 kg    General appearance: Awake alert.  In no distress Resp: Clear to auscultation bilaterally.  Normal effort Cardio: S1-S2 is normal regular.  No  S3-S4.  No rubs murmurs or bruit GI: Abdomen is soft.  Nontender nondistended.  Bowel sounds are present normal.  No masses organomegaly    Lab Results:  Data Reviewed: I have personally reviewed following labs and reports of the imaging studies  CBC: Recent Labs  Lab 06/20/23 0329 06/21/23 0455 06/22/23 0423 06/23/23 0429 06/24/23 0434  WBC 20.2* 30.3* 23.8* 19.0* 15.4*  HGB 12.6 11.9* 12.2 10.8* 10.6*  HCT 38.2 36.4 38.0 33.9* 33.5*  MCV 87.2 87.5 90.9 89.2 89.8  PLT 306 263 254 251 282    Basic Metabolic Panel: Recent Labs  Lab 06/19/23 1204 06/20/23 0329 06/21/23 0455 06/22/23 0423 06/23/23 0429  NA 139 137 144 144 138  K 3.7 3.7 4.0 3.4* 3.9  CL 101 99 106 105 106  CO2 26 27 24  24 21*  GLUCOSE 144* 137* 160* 168* 167*  BUN 43* 49* 46* 39* 35*  CREATININE 1.02* 1.20* 1.03* 0.95 1.08*  CALCIUM 9.2 9.4 9.5 9.4 8.6*  MG  --   --   --   --  2.1    GFR: Estimated Creatinine Clearance: 26.8 mL/min (A) (by C-G formula based on SCr of 1.08 mg/dL (H)).  Liver Function Tests: Recent Labs  Lab 06/20/23 0329  AST 14*  ALT 33  ALKPHOS 75  BILITOT 1.0  PROT 6.9  ALBUMIN 3.1*    CBG: Recent Labs  Lab 06/23/23 1752 06/23/23 1922 06/23/23 2323 06/24/23 0256 06/24/23 0733  GLUCAP 120* 143* 126* 130* 94     Radiology Studies: No results found.     LOS: 14 days   Jawuan Robb [employer] on [email or web address]  06/24/2023, 8:51 AM

## 2023-06-24 NOTE — Progress Notes (Addendum)
Calorie Count Note  RD received calorie count consult. No envelope on pt's door at this time. No calorie count documentation provided at this time. RD ordered a 48-hour calorie count to start today at 0800. RD hung calorie count envelope on pt's door. Discussed plan with RN and with NT.  Spoke with pt's son at bedside. He reports that pt has not yet had breakfast. Discussed importance of encouraging and increasing PO intake to facilitate Cortrak removal. Pt's son shares that pt does not eat meat or eggs. Given pt is on a dysphagia 1 diet, this will significantly limit her protein options. Encouraged pt's son to bring in food from home per pt's preferences that adheres to dysphagia 1 diet order. Will add pureed meats and pureed eggs as dislikes on [employer] diet software.  RD will follow up tomorrow, 06/25/23, for day 1 calorie count results.  Will plan to continue nocturnal tube feeds via Cortrak as ordered at this time: - Osmolite 1.2 @ 83 ml/hr x 12 hours from 1800 to 0600 (total of 996 ml nightly) - PROSource TF20 60 ml daily  Nocturnal tube feeding regimen provides 1275 kcal, 75 grams of protein, and 817 ml of H2O.    Mertie Clause, MS, RD, LDN Registered Dietitian II Please see AMiON for contact information.

## 2023-06-24 NOTE — Plan of Care (Signed)
  Problem: Education: Goal: Knowledge of disease or condition will improve Outcome: Progressing Goal: Knowledge of secondary prevention will improve (MUST DOCUMENT ALL) Outcome: Progressing Goal: Knowledge of patient specific risk factors will improve Loraine Leriche N/A or DELETE if not current risk factor) Outcome: Progressing   Problem: Spontaneous Subarachnoid Hemorrhage Tissue Perfusion: Goal: Complications of Spontaneous Subarachnoid Hemorrhage will be minimized Outcome: Progressing   Problem: Coping: Goal: Will verbalize positive feelings about self Outcome: Progressing Goal: Will identify appropriate support needs Outcome: Progressing   Problem: Health Behavior/Discharge Planning: Goal: Ability to manage health-related needs will improve Outcome: Progressing Goal: Goals will be collaboratively established with patient/family Outcome: Progressing   Problem: Self-Care: Goal: Ability to participate in self-care as condition permits will improve Outcome: Progressing Goal: Verbalization of feelings and concerns over difficulty with self-care will improve Outcome: Progressing Goal: Ability to communicate needs accurately will improve Outcome: Progressing   Problem: Nutrition: Goal: Risk of aspiration will decrease Outcome: Progressing Goal: Dietary intake will improve Outcome: Progressing   Problem: Education: Goal: Knowledge of General Education information will improve Description: Including pain rating scale, medication(s)/side effects and non-pharmacologic comfort measures Outcome: Progressing   Problem: Health Behavior/Discharge Planning: Goal: Ability to manage health-related needs will improve Outcome: Progressing   Problem: Clinical Measurements: Goal: Ability to maintain clinical measurements within normal limits will improve Outcome: Progressing Goal: Will remain free from infection Outcome: Progressing Goal: Diagnostic test results will improve Outcome:  Progressing Goal: Respiratory complications will improve Outcome: Progressing Goal: Cardiovascular complication will be avoided Outcome: Progressing   Problem: Activity: Goal: Risk for activity intolerance will decrease Outcome: Progressing   Problem: Nutrition: Goal: Adequate nutrition will be maintained Outcome: Progressing   Problem: Coping: Goal: Level of anxiety will decrease Outcome: Progressing   Problem: Elimination: Goal: Will not experience complications related to bowel motility Outcome: Progressing Goal: Will not experience complications related to urinary retention Outcome: Progressing   Problem: Pain Managment: Goal: General experience of comfort will improve Outcome: Progressing   Problem: Safety: Goal: Ability to remain free from injury will improve Outcome: Progressing   Problem: Skin Integrity: Goal: Risk for impaired skin integrity will decrease Outcome: Progressing

## 2023-06-25 LAB — CBC
HCT: 31.9 % — ABNORMAL LOW (ref 36.0–46.0)
Hemoglobin: 10.1 g/dL — ABNORMAL LOW (ref 12.0–15.0)
MCH: 29.1 pg (ref 26.0–34.0)
MCHC: 31.7 g/dL (ref 30.0–36.0)
MCV: 91.9 fL (ref 80.0–100.0)
Platelets: 300 10*3/uL (ref 150–400)
RBC: 3.47 MIL/uL — ABNORMAL LOW (ref 3.87–5.11)
RDW: 14.6 % (ref 11.5–15.5)
WBC: 12.6 10*3/uL — ABNORMAL HIGH (ref 4.0–10.5)
nRBC: 0 % (ref 0.0–0.2)

## 2023-06-25 LAB — BASIC METABOLIC PANEL
Anion gap: 11 (ref 5–15)
BUN: 19 mg/dL (ref 8–23)
CO2: 23 mmol/L (ref 22–32)
Calcium: 8.1 mg/dL — ABNORMAL LOW (ref 8.9–10.3)
Chloride: 103 mmol/L (ref 98–111)
Creatinine, Ser: 0.63 mg/dL (ref 0.44–1.00)
GFR, Estimated: >60 mL/min (ref >60)
Glucose, Bld: 152 mg/dL — ABNORMAL HIGH (ref 70–99)
Potassium: 3.7 mmol/L (ref 3.5–5.1)
Sodium: 137 mmol/L (ref 135–145)

## 2023-06-25 LAB — MAGNESIUM: Magnesium: 1.9 mg/dL (ref 1.7–2.4)

## 2023-06-25 LAB — GLUCOSE, CAPILLARY
Glucose-Capillary: 155 mg/dL — ABNORMAL HIGH (ref 70–99)
Glucose-Capillary: 145 mg/dL — ABNORMAL HIGH (ref 70–99)
Glucose-Capillary: 135 mg/dL — ABNORMAL HIGH (ref 70–99)
Glucose-Capillary: 122 mg/dL — ABNORMAL HIGH (ref 70–99)
Glucose-Capillary: 114 mg/dL — ABNORMAL HIGH (ref 70–99)

## 2023-06-25 LAB — PHOSPHORUS: Phosphorus: 3.1 mg/dL (ref 2.5–4.6)

## 2023-06-25 MED ORDER — TAMSULOSIN HCL 0.4 MG PO CAPS
0.4000 mg | ORAL_CAPSULE | Freq: Every day | ORAL | Status: DC
  Administered 2023-06-25 – 2023-06-26 (×2): 0.4 mg via ORAL
  Filled 2023-06-25 (×2): qty 1

## 2023-06-25 MED ORDER — ENSURE ENLIVE PO LIQD
237.0000 mL | Freq: Two times a day (BID) | ORAL | Status: DC
  Administered 2023-06-25 – 2023-06-27 (×5): 237 mL via ORAL

## 2023-06-25 MED ORDER — SODIUM CHLORIDE 0.9 % IV SOLN
3.0000 g | Freq: Three times a day (TID) | INTRAVENOUS | Status: AC
  Administered 2023-06-25 – 2023-06-27 (×6): 3 g via INTRAVENOUS
  Filled 2023-06-25 (×6): qty 8

## 2023-06-25 NOTE — Progress Notes (Signed)
Pt unable to void. Bladder scan performed, and a total of 650 ml of urine scanned.  Straight In and Out Cath performed, and a total of 900 ml of urine drained. Post In and Out cath indicated 0 urinary residual.

## 2023-06-25 NOTE — Progress Notes (Signed)
PHARMACY NOTE:  ANTIMICROBIAL RENAL DOSAGE ADJUSTMENT  Current antimicrobial regimen includes a mismatch between antimicrobial dosage and estimated renal function.  As per policy approved by the Pharmacy & Therapeutics and Medical Executive Committees, the antimicrobial dosage will be adjusted accordingly.  Current antimicrobial dosage:  Unasyn 3g q12h   Indication: ASA PNA   Renal Function:  Estimated Creatinine Clearance: 36.1 mL/min (by C-G formula based on SCr of 0.63 mg/dL). []      On intermittent HD, scheduled: []      On CRRT    Antimicrobial dosage has been changed to:  Unasyn 3g q8h   Additional comments: Stop date carried forwarded for 7 day course    Thank you for allowing pharmacy to be a part of this patient's care.  Estill Batten, PharmD, BCCCP  06/25/2023 1:54 PM

## 2023-06-25 NOTE — Progress Notes (Addendum)
[facility] PROGRESS NOTE   Yolanda [name] LKG:[id number] DOB: Feb 25, 1933 DOA: 06/10/2023  PCP: Pcp, No  Brief History/Interval Summary: 89 yo [place] female with a past medical history of asthma, dementia, essential hypertension presented to ER with sudden onset of severe headache, nausea and vomiting. Associated with Rt side weakness, photophobia and facial droop. CT head showed grade 3 SAH, and CTA showed Lt MCA aneurysm, Rt MCA aneurysm, and Rt PCA aneurysm. Neurosurgery consulted and PCCM asked to admit to ICU.  Patient underwent coiling of the left MCA aneurysm.  Patient also needed to be intubated.  She developed stridor after extubation which was treated with racemic epinephrine and Decadron.  Respiratory status improved.  She was transferred to progressive floor.  Consultants: Neurosurgery.  Pulmonology  Procedures: Status post subtotal coil embolization of large left MCA aneurysm    Subjective/Interval History: Patient's son is at the bedside.  Patient more communicative in the last few days.  Son mentions that patient eats small amounts of meals to begin with.  He seems to be satisfied with the amount she is having here in the hospital.    Assessment/Plan:  Subarachnoid hemorrhage/aneurysm rupture Seen by neurosurgery.  Was initially admitted to ICU. Underwent coil embolization of the left MCA aneurysm. Neurosurgery recommends 21-day course of nimodipine which will end on 07/01/2023. Seen by PT and OT.  Initially inpatient rehabilitation was recommended.  However patient not able to tolerate high intensity of therapy.  So skilled nursing facility for rehab was recommended.  Family prefers to take her home. Neurologically she is stable.    Acute delirium Off Precedex since 8/27. Patient noted to be on Zoloft trazodone and Rozerem.   Dose of trazodone was increased.   Delirium has improved.  More cooperative.  Acute on chronic diastolic CHF/acute pulmonary  edema/history of essential hypertension/prolonged QTc Echocardiogram showed normal EF.  Was given Lasix x 1 on 8/28.  No further need for diuretics.    Aspiration pneumonia Chest x-ray showed right lung opacity.  WBC was noted to be climbing.   She was started on Unasyn on 8/29.   Planning for a 7-day course of antibiotics.   WBC continues to improve.   Respiratory status is stable.  Dysphagia Patient has a core track feeding tube through which she is getting Osmolite. Speech therapy has been following.  Currently on a dysphagia 1 diet. Apparently she has no teeth which makes the whole situation even more challenging.   She is getting nocturnal feeds. Discussed in detail with patient's son who was at the bedside.  He mentions that patient eat small amounts to begin with.  She was eating small amounts even at home prior to this admission.  He and family do not want to consider PEG tube. In view of this we will continue with calorie count for now.  Will likely need to tolerate a lower amount of calorie intake in this patient considering her usual baseline. If oral intake remains consistent over the next 24 hours we could potentially discontinue her NG tube tomorrow.  Diarrhea Likely due to tube feedings.  Has a rectal tube.  Nursing staff to consider removing depending on stool output.  Imodium can be given as needed.    Hypokalemia Supplemented.  Magnesium 2.1.    History of asthma Stable.  Steroid-induced hyperglycemia Improved since she has completed course of dexamethasone.  Anemia of critical illness Improved.  Leukocytosis Initially due to steroids however started increasing again after she was taken off  of steroids send likely due to aspiration pneumonia.  See above.  ADDENDUM Patient with urinary retention. Has had 3 in and out caths. Foley will be placed. Flomax will be initiated.   DVT Prophylaxis: Subcutaneous heparin Code Status: Full code Family Communication:  Discussed with son Disposition Plan: Son wants to take her home.  Hopefully NG tube can be removed tomorrow if oral intake remains adequate over the next 24 hours.  Anticipate discharge in 2 to 3 days.     Medications: Scheduled:  arformoterol  15 mcg Nebulization BID   bisoprolol  5 mg Oral Daily   budesonide (PULMICORT) nebulizer solution  0.5 mg Nebulization BID   Chlorhexidine Gluconate Cloth  6 each Topical Daily   feeding supplement (OSMOLITE 1.2 CAL)  1,000 mL Per Tube Q24H   feeding supplement (PROSource TF20)  60 mL Per Tube Daily   fiber supplement (BANATROL TF)  60 mL Oral BID   heparin injection (subcutaneous)  5,000 Units Subcutaneous Q8H   insulin aspart  0-6 Units Subcutaneous Q4H   niMODipine  60 mg Per Tube Q4H   Or   niMODipine  60 mg Oral Q4H   mouth rinse  15 mL Mouth Rinse 4 times per day   ramelteon  8 mg Oral QHS   sertraline  25 mg Oral Daily   traZODone  100 mg Oral QHS   Continuous:  sodium chloride     sodium chloride 10 mL/hr at 06/19/23 0900   ampicillin-sulbactam (UNASYN) IV 3 g (06/25/23 0312)   XBM:[id number] **OR** acetaminophen, albuterol, diphenhydrAMINE, labetalol, [COMPLETED] loperamide HCl **FOLLOWED BY** loperamide HCl, mouth rinse, oxyCODONE  Antibiotics: Anti-infectives (From admission, onward)    Start     Dose/Rate Route Frequency Ordered Stop   06/20/23 1545  Ampicillin-Sulbactam (UNASYN) 3 g in sodium chloride 0.9 % 100 mL IVPB        3 g 200 mL/hr over 30 Minutes Intravenous Every 12 hours 06/20/23 1446     06/12/23 1130  Ampicillin-Sulbactam (UNASYN) 3 g in sodium chloride 0.9 % 100 mL IVPB  Status:  Discontinued        3 g 200 mL/hr over 30 Minutes Intravenous Every 6 hours 06/12/23 1036 06/13/23 0922       Objective:  Vital Signs  Vitals:   06/25/23 0404 06/25/23 0746 06/25/23 0805 06/25/23 0806  BP:  133/68    Pulse:  75    Resp:  20    Temp:  97.7 F (36.5 C)    TempSrc:  Oral    SpO2:  98% 98% 97%   Weight: 63.2 kg     Height:        Intake/Output Summary (Last 24 hours) at 06/25/2023 0931 Last data filed at 06/25/2023 0746 Gross per 24 hour  Intake 1912.01 ml  Output 3200 ml  Net -1287.99 ml   Filed Weights   06/23/23 0500 06/24/23 0404 06/25/23 0404  Weight: 63.2 kg 63.2 kg 63.2 kg    General appearance: Awake alert.  In no distress Resp: Clear to auscultation bilaterally.  Normal effort Cardio: S1-S2 is normal regular.  No S3-S4.  No rubs murmurs or bruit GI: Abdomen is soft.  Nontender nondistended.  Bowel sounds are present normal.  No masses organomegaly     Lab Results:  Data Reviewed: I have personally reviewed following labs and reports of the imaging studies  CBC: Recent Labs  Lab 06/21/23 0455 06/22/23 0423 06/23/23 0429 06/24/23 0434 06/25/23 0734  WBC 30.3* 23.8*  19.0* 15.4* 12.6*  HGB 11.9* 12.2 10.8* 10.6* 10.1*  HCT 36.4 38.0 33.9* 33.5* 31.9*  MCV 87.5 90.9 89.2 89.8 91.9  PLT 263 254 251 282 300    Basic Metabolic Panel: Recent Labs  Lab 06/20/23 0329 06/21/23 0455 06/22/23 0423 06/23/23 0429 06/25/23 0734  NA 137 144 144 138 137  K 3.7 4.0 3.4* 3.9 3.7  CL 99 106 105 106 103  CO2 27 24 24  21* 23  GLUCOSE 137* 160* 168* 167* 152*  BUN 49* 46* 39* 35* 19  CREATININE 1.20* 1.03* 0.95 1.08* 0.63  CALCIUM 9.4 9.5 9.4 8.6* 8.1*  MG  --   --   --  2.1 1.9  PHOS  --   --   --   --  3.1    GFR: Estimated Creatinine Clearance: 36.1 mL/min (by C-G formula based on SCr of 0.63 mg/dL).  Liver Function Tests: Recent Labs  Lab 06/20/23 0329  AST 14*  ALT 33  ALKPHOS 75  BILITOT 1.0  PROT 6.9  ALBUMIN 3.1*    CBG: Recent Labs  Lab 06/24/23 1544 06/24/23 1927 06/24/23 2325 06/25/23 0304 06/25/23 0743  GLUCAP 102* 129* 125* 155* 145*     Radiology Studies: No results found.     LOS: 15 days   Joevanni Roddey [employer] on [email or web address]  06/25/2023, 9:31 AM

## 2023-06-25 NOTE — Progress Notes (Signed)
Calorie Count Note: Day 1 Results  48-hour calorie count ordered. Please see day 1 results below.  Per MD note, pt's son has mentioned that pt eats small amounts of food at baseline. Pt's son and family do not want to consider PEG tube placement. Plan is to continue calorie count for now. If oral intake remains consistent over the next 24 hours, MD to consider discontinuing Cortrak tube tomorrow.  Diet: dysphagia 1, thin liquids Supplements: none ordered  06/24/23: Breakfast: no documentation available Lunch: 205 kcal, 4 grams of protein (food from home) Dinner: 205 kcal, 4 grams of protein (food from home)  Day 1 total intake: 410 kcal (34% of minimum estimated needs)  8 grams of protein (11% of minimum estimated needs)  Nutrition Diagnosis: Inadequate oral intake related to inability to eat as evidenced by NPO status. - Progressing  Goal: Patient will meet greater than or equal to 90% of their needs. - Progressing  Interventions: - Continue calorie count for 1 more day; RD to follow up with day 2 results tomorrow, 06/26/23 - Add Ensure Enlive po BID, each supplement provides 350 kcal and 20 grams of protein - Continue nocturnal tube feeds of Osmolite 1.2 @ 83 ml/hr x 12 hours from 1800 to 0600 (total of 996 ml nightly) with PROSource TF20 60 ml daily - Continue Banatrol TF BID - Encourage PO intake   Mertie Clause, MS, RD, LDN Registered Dietitian II Please see AMiON for contact information.

## 2023-06-25 NOTE — Progress Notes (Signed)
Occupational Therapy Treatment Patient Details Name: Yolanda [name] MRN: [id number] DOB: 10/29/1932 Today's Date: 06/25/2023   History of present illness Pt is an 89 y.o. female who presented 06/10/23 with acute onset of severe headache and R-sided weakness. CT scan demonstrates evidence of a diffuse subarachnoid hemorrhage more prominent in her left sylvian fissure. 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 S/p coil embolization of L MCA aneurysm 8/19. Intubated 8/20. Cortrak placed 8/21. PMH: dementia, asthma, OA, HTN   OT comments  Utilized translator during session, patient reporting being in the bed for too long.  Educated on length of stay and that therapy is working on her strength.  She is disoriented to place and situation.  She requires mod assist for bed mobility, total assist for LB dressing and toileting (due to diarrhea in bed). Pt fatigues easily, follows simple commands to engage in ADLs but some ideamotor apraxia (attempting to put socks on her hands, but then able to voice socks go on her feet when questioned). Will follow acutely, continue to recommend <3hrs/day rehab at dc.       If plan is discharge home, recommend the following:  Two people to help with walking and/or transfers;Assistance with cooking/housework;Assistance with feeding;Direct supervision/assist for medications management;Direct supervision/assist for financial management;Assist for transportation;Help with stairs or ramp for entrance;Supervision due to cognitive status;A lot of help with bathing/dressing/bathroom   Equipment Recommendations  Other (comment) (defer)    Recommendations for Other Services      Precautions / Restrictions Precautions Precautions: Fall Precaution Comments: bil mittens,  SBP goal <150; cortrak Restrictions Weight Bearing Restrictions: No       Mobility Bed Mobility Overal bed mobility:  Needs Assistance Bed Mobility: Rolling, Sidelying to Sit, Sit to Sidelying Rolling: Mod assist Sidelying to sit: Mod assist     Sit to sidelying: Min assist General bed mobility comments: mod assist for trunk support/scoot hips forward to ascend, increased cueing for participation in task.  Increased difficulty initating rolling towards L side compared to R side.    Transfers                         Balance Overall balance assessment: Needs assistance Sitting-balance support: Single extremity supported, Feet supported Sitting balance-Leahy Scale: Fair Sitting balance - Comments: min guard statically for safety                                   ADL either performed or assessed with clinical judgement   ADL Overall ADL's : Needs assistance/impaired                     Lower Body Dressing: Total assistance;Sitting/lateral leans Lower Body Dressing Details (indicate cue type and reason): attempting to don socks with multiple attempts, initially donning socks on hands     Toileting- Clothing Manipulation and Hygiene: Total assistance;Bed level Toileting - Clothing Manipulation Details (indicate cue type and reason): incontient bowel, assist for pad change bed level     Functional mobility during ADLs: Moderate assistance      Extremity/Trunk Assessment Upper Extremity Assessment Upper Extremity Assessment: RUE deficits/detail;LUE deficits/detail RUE Deficits / Details: moving UE and able to raise above head to command, squeezing hand 3/5 MMT (tends to move L arm first, requring cueing ofr R side) RUE Coordination: decreased fine  motor;decreased gross motor LUE Deficits / Details: moving UE and raising above head when cued, MMT grasp 3/5            Vision       Perception     Praxis      Cognition Arousal: Alert Behavior During Therapy: Flat affect Overall Cognitive Status: Impaired/Different from baseline Area of Impairment:  Orientation, Attention, Memory, Following commands, Safety/judgement, Awareness, Problem solving                 Orientation Level: Disoriented to, Place, Time, Situation Current Attention Level: Focused Memory: Decreased recall of precautions, Decreased short-term memory Following Commands: Follows one step commands with increased time, Follows one step commands inconsistently Safety/Judgement: Decreased awareness of safety, Decreased awareness of deficits Awareness: Intellectual Problem Solving: Slow processing, Difficulty sequencing, Requires verbal cues, Requires tactile cues, Decreased initiation General Comments: pt alert and following most simple commands with increased time, she is unaware of place or situation but does acknowledgde laying the her bed for a while, and needing to have assistance to sit up        Exercises      Shoulder Instructions       General Comments VSS onRA    Pertinent Vitals/ Pain       Pain Assessment Pain Assessment: Faces Faces Pain Scale: Hurts a little bit Pain Location: headache Pain Descriptors / Indicators: Discomfort, Grimacing, Moaning Pain Intervention(s): Limited activity within patient's tolerance, Monitored during session, Repositioned  Home Living                                          Prior Functioning/Environment              Frequency  Min 1X/week        Progress Toward Goals  OT Goals(current goals can now be found in the care plan section)  Progress towards OT goals: Progressing toward goals  Acute Rehab OT Goals Patient Stated Goal: none stated OT Goal Formulation: Patient unable to participate in goal setting Time For Goal Achievement: 06/27/23 Potential to Achieve Goals: Fair  Plan      Co-evaluation                 AM-PAC OT "6 Clicks" Daily Activity     Outcome Measure   Help from another person eating meals?: A Lot Help from another person taking care of  personal grooming?: A Lot Help from another person toileting, which includes using toliet, bedpan, or urinal?: Total Help from another person bathing (including washing, rinsing, drying)?: Total Help from another person to put on and taking off regular upper body clothing?: A Lot Help from another person to put on and taking off regular lower body clothing?: Total 6 Click Score: 9    End of Session    OT Visit Diagnosis: Unsteadiness on feet (R26.81);Other abnormalities of gait and mobility (R26.89);Muscle weakness (generalized) (M62.81);Hemiplegia and hemiparesis;Other symptoms and signs involving cognitive function;Cognitive communication deficit (R41.841) Symptoms and signs involving cognitive functions: Nontraumatic SAH Hemiplegia - Right/Left: Right Hemiplegia - dominant/non-dominant: Dominant Hemiplegia - caused by: Nontraumatic SAH   Activity Tolerance Patient tolerated treatment well   Patient Left in bed;with call bell/phone within reach;with bed alarm set;with restraints reapplied;with family/visitor present   Nurse Communication Mobility status        Time: 1478-2956 OT Time Calculation (min): 28 min  Charges: OT General Charges $OT Visit: 1 Visit OT Treatments $Self Care/Home Management : 8-22 mins $Therapeutic Activity: 8-22 mins  Barry Brunner, OT Acute Rehabilitation Services Office [phone]   Chancy Milroy 06/25/2023, 1:38 PM

## 2023-06-26 DIAGNOSIS — I609 Nontraumatic subarachnoid hemorrhage, unspecified: Secondary | ICD-10-CM | POA: Diagnosis not present

## 2023-06-26 LAB — BASIC METABOLIC PANEL
Anion gap: 8 (ref 5–15)
BUN: 13 mg/dL (ref 8–23)
CO2: 26 mmol/L (ref 22–32)
Calcium: 8 mg/dL — ABNORMAL LOW (ref 8.9–10.3)
Chloride: 101 mmol/L (ref 98–111)
Creatinine, Ser: 0.6 mg/dL (ref 0.44–1.00)
GFR, Estimated: >60 mL/min (ref >60)
Glucose, Bld: 143 mg/dL — ABNORMAL HIGH (ref 70–99)
Potassium: 3.7 mmol/L (ref 3.5–5.1)
Sodium: 135 mmol/L (ref 135–145)

## 2023-06-26 LAB — GLUCOSE, CAPILLARY
Glucose-Capillary: 113 mg/dL — ABNORMAL HIGH (ref 70–99)
Glucose-Capillary: 117 mg/dL — ABNORMAL HIGH (ref 70–99)
Glucose-Capillary: 198 mg/dL — ABNORMAL HIGH (ref 70–99)
Glucose-Capillary: 127 mg/dL — ABNORMAL HIGH (ref 70–99)
Glucose-Capillary: 133 mg/dL — ABNORMAL HIGH (ref 70–99)
Glucose-Capillary: 100 mg/dL — ABNORMAL HIGH (ref 70–99)
Glucose-Capillary: 108 mg/dL — ABNORMAL HIGH (ref 70–99)

## 2023-06-26 MED ORDER — SACCHAROMYCES BOULARDII 250 MG PO CAPS
250.0000 mg | ORAL_CAPSULE | Freq: Two times a day (BID) | ORAL | Status: DC
  Administered 2023-06-26 – 2023-06-27 (×3): 250 mg via ORAL
  Filled 2023-06-26 (×4): qty 1

## 2023-06-26 MED ORDER — NIMODIPINE 6 MG/ML PO SOLN
60.0000 mg | ORAL | Status: DC
  Administered 2023-06-26 – 2023-06-27 (×6): 60 mg via ORAL
  Filled 2023-06-26 (×9): qty 10

## 2023-06-26 MED ORDER — LOPERAMIDE HCL 1 MG/7.5ML PO SUSP
4.0000 mg | Freq: Three times a day (TID) | ORAL | Status: AC | PRN
  Administered 2023-06-27: 4 mg via ORAL

## 2023-06-26 MED ORDER — NIMODIPINE 30 MG PO CAPS
60.0000 mg | ORAL_CAPSULE | ORAL | Status: DC
  Administered 2023-06-26: 30 mg via ORAL
  Filled 2023-06-26: qty 2

## 2023-06-26 NOTE — Progress Notes (Signed)
Speech Language Pathology Treatment: Dysphagia;Cognitive-Linquistic  Patient Details Name: Yolanda [name] MRN: [id number] DOB: Mar 03, 1933 Today's Date: 06/26/2023 Time: 4098-1191 SLP Time Calculation (min) (ACUTE ONLY): 11 min  Assessment / Plan / Recommendation Clinical Impression  Pt alert, needing cleaning up in bed, but aware and waiting for staff assist. Pt has tolerated purees and thins; is enjoying ensure and took some fo this from me. Primarily eating prepared vegetable purees from home. SLP placed denture and pt demonstrated ability to masticate with extra time. She was distracted by pain and needed a liquid wash to fully clear, but she initiate this. Talked to family about introducing soft solids, like well cooked vegetables and soft breads or rice with sauce to increase variety and quality of intake. Will officially advance diet to mechanical soft to allow this.  Family feels language is baseline. Her speech is soft and mildly dysarthric but intelligible. Able to repeat and perform structured language tasks with accuracy today. Daughter reports memory was impaired prior to arrival. Pt has exhibited from recognizable impairments in basic functional tasks per prior notes. Will attempt to f/u with self feeding meals in the future to address attention and cognition with self feeding as appropriate   HPI HPI: 89 year old female with history of mild asthma and HTN presents 06/10/2023 to the ED with acute onset of severe headache.  CT scan demonstrates evidence of a diffuse Grade 3 subarachnoid hemorrhage more prominent in her left sylvian fissure. 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 Pt was intubated 8/21-8/23.  Pt completed a BSE on 8/20, but was consulted for re-evaluation secondary to acute respiratory distress and intubation.      SLP Plan  Continue with current plan of care      Recommendations for  follow up therapy are one component of a multi-disciplinary discharge planning process, led by the attending physician.  Recommendations may be updated based on patient status, additional functional criteria and insurance authorization.    Recommendations  Diet recommendations: Thin liquid;Dysphagia 3 (mechanical soft) Liquids provided via: Cup;Straw Medication Administration: Whole meds with liquid Supervision: Trained caregiver to feed patient;Full supervision/cueing for compensatory strategies Compensations: Follow solids with liquid                  Oral care BID     Dysphagia, oral phase (R13.11)     Continue with current plan of care     Munira Polson, [name]  06/26/2023, 10:59 AM

## 2023-06-26 NOTE — Progress Notes (Signed)
PROGRESS NOTE    Yolanda [name]  ZOX:[id number] DOB: August 06, 1933 DOA: 06/10/2023 PCP: Pcp, No   Brief Narrative: 89 year old with past medical history significant for asthma, dementia, essential hypertension presented to the ED with sudden onset of severe headache, nausea vomiting.  Associated with right-sided weakness, photophobia and facial droop.  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  Neurosurgery consulted and PCCM asked to admit patient to the ICU.  Patient underwent coiling of the left MCA aneurysm.  Patient also required  intubation. .  She developed stridor after extubation which was treated with racemic epinephrine and Decadron.  Respiratory status improved.  She was transferred to progressive care floor.   Assessment & Plan:   Principal Problem:   Subarachnoid hemorrhage (HCC) Active Problems:   Acute respiratory failure with hypoxia (HCC)  1-Subarachnoid hemorrhage/aneurysmal rupture -Evaluated by neurosurgery.  Initially admitted to the ICU. -Underwent coil embolization of the left MCA aneurysmal -Neurosurgery recommends 21 days course of nimodipine which will end 07/01/2023. -Family  prefers to take patient home with home health.  Acute delirium: Off Precedex since 8/27 Patient noted to be on Zoloft and trazodone and Rozerem Trazodone was increased during this hospitalization Delirium improving  Acute on chronic diastolic CHF/acute pulmonary edema/history of essential hypertension prolonged QT Echocardiogram showed normal ejection fraction Received times 1 lasix on 8/28  Aspiration  pneumonia: Chest x-ray showed right lung opacity, Leukocytosis.  Started on Unasyn 8/29 Plan to treat for a total course of 7 days of antibiotics.   Dysphagia: Currently on core track getting Osmolite On dysphagia 1 diet Family patient was eating small amounts at home even prior to this admission.  Family does not want to  proceed with PEG tube placement. Plna to remove core track.   Diarrhea: Likely setting of tube feeding Start florastore as well.   Hypokalemia: replaced.   History of asthma: On Brovana, pulicort.  PRN nebulizer.   Steroid-induced hyperglycemia: Completed steroids.   Anemia of critical illness: Hb stable.   Leukocytosis; Related to PNA and steroids.   Urinary retention: Status post 3 In-N-Out cath.  Started on Flomax    Nutrition Problem: Inadequate oral intake Etiology: inability to eat    Signs/Symptoms: NPO status    Interventions: Tube feeding  Estimated body mass index is 28.47 kg/m as calculated from the following:   Height as of this encounter: 4\' 8"  (1.422 m).   Weight as of this encounter: 57.6 kg.   DVT prophylaxis: Heparin  Code Status: full code Family Communication: daughter at bedside.  Disposition Plan:  Status is: Inpatient Remains inpatient appropriate because: management of SAH    Consultants:  Neurosurgery   Procedures:  Aneurysmal coiling.   Antimicrobials:    Subjective: She is alert, daughter assist with translation. Per daughter patient  has been eating a lot. She confirm they don't want Peg tube.   Objective: Vitals:   06/25/23 2100 06/25/23 2322 06/26/23 0323 06/26/23 0739  BP: (!) 117/54 (!) 119/54 (!) 109/51 (!) 135/57  Pulse: 74 64 66 75  Resp: 18 15 15  (!) 25  Temp:  99.2 F (37.3 C) 98.5 F (36.9 C) 98.7 F (37.1 C)  TempSrc:  Axillary Oral Oral  SpO2: 97% 97% 93% 96%  Weight:   57.6 kg   Height:        Intake/Output Summary (Last 24 hours) at 06/26/2023 0810 Last data filed at 06/26/2023 0609 Gross per 24 hour  Intake 200 ml  Output 2600 ml  Net -2400 ml   Filed Weights   06/24/23 0404 06/25/23 0404 06/26/23 0323  Weight: 63.2 kg 63.2 kg 57.6 kg    Examination:  General exam: Appears calm and comfortable  Respiratory system: Clear to auscultation. Respiratory effort normal. Cardiovascular system:  S1 & S2 heard, RRR.  Gastrointestinal system: Abdomen is nondistended, soft and nontender.  Central nervous system: Alert  Extremities: no edema  Data Reviewed: I have personally reviewed following labs and imaging studies  CBC: Recent Labs  Lab 06/21/23 0455 06/22/23 0423 06/23/23 0429 06/24/23 0434 06/25/23 0734  WBC 30.3* 23.8* 19.0* 15.4* 12.6*  HGB 11.9* 12.2 10.8* 10.6* 10.1*  HCT 36.4 38.0 33.9* 33.5* 31.9*  MCV 87.5 90.9 89.2 89.8 91.9  PLT 263 254 251 282 300   Basic Metabolic Panel: Recent Labs  Lab 06/20/23 0329 06/21/23 0455 06/22/23 0423 06/23/23 0429 06/25/23 0734  NA 137 144 144 138 137  K 3.7 4.0 3.4* 3.9 3.7  CL 99 106 105 106 103  CO2 27 24 24  21* 23  GLUCOSE 137* 160* 168* 167* 152*  BUN 49* 46* 39* 35* 19  CREATININE 1.20* 1.03* 0.95 1.08* 0.63  CALCIUM 9.4 9.5 9.4 8.6* 8.1*  MG  --   --   --  2.1 1.9  PHOS  --   --   --   --  3.1   GFR: Estimated Creatinine Clearance: 34.4 mL/min (by C-G formula based on SCr of 0.63 mg/dL). Liver Function Tests: Recent Labs  Lab 06/20/23 0329  AST 14*  ALT 33  ALKPHOS 75  BILITOT 1.0  PROT 6.9  ALBUMIN 3.1*   No results for input(s): "LIPASE", "AMYLASE" in the last 168 hours. No results for input(s): "AMMONIA" in the last 168 hours. Coagulation Profile: No results for input(s): "INR", "PROTIME" in the last 168 hours. Cardiac Enzymes: No results for input(s): "CKTOTAL", "CKMB", "CKMBINDEX", "TROPONINI" in the last 168 hours. BNP (last 3 results) No results for input(s): "PROBNP" in the last 8760 hours. HbA1C: No results for input(s): "HGBA1C" in the last 72 hours. CBG: Recent Labs  Lab 06/25/23 1519 06/25/23 2008 06/25/23 2317 06/26/23 0325 06/26/23 0745  GLUCAP 122* 114* 113* 117* 198*   Lipid Profile: No results for input(s): "CHOL", "HDL", "LDLCALC", "TRIG", "CHOLHDL", "LDLDIRECT" in the last 72 hours. Thyroid Function Tests: No results for input(s): "TSH", "T4TOTAL", "FREET4",  "T3FREE", "THYROIDAB" in the last 72 hours. Anemia Panel: No results for input(s): "VITAMINB12", "FOLATE", "FERRITIN", "TIBC", "IRON", "RETICCTPCT" in the last 72 hours. Sepsis Labs: No results for input(s): "PROCALCITON", "LATICACIDVEN" in the last 168 hours.  No results found for this or any previous visit (from the past 240 hour(s)).       Radiology Studies: No results found.      Scheduled Meds:  arformoterol  15 mcg Nebulization BID   bisoprolol  5 mg Oral Daily   budesonide (PULMICORT) nebulizer solution  0.5 mg Nebulization BID   Chlorhexidine Gluconate Cloth  6 each Topical Daily   feeding supplement  237 mL Oral BID BM   feeding supplement (OSMOLITE 1.2 CAL)  1,000 mL Per Tube Q24H   feeding supplement (PROSource TF20)  60 mL Per Tube Daily   fiber supplement (BANATROL TF)  60 mL Oral BID   heparin injection (subcutaneous)  5,000 Units Subcutaneous Q8H   insulin aspart  0-6 Units Subcutaneous Q4H   niMODipine  60 mg Per Tube Q4H   Or  niMODipine  60 mg Oral Q4H   mouth rinse  15 mL Mouth Rinse 4 times per day   ramelteon  8 mg Oral QHS   sertraline  25 mg Oral Daily   tamsulosin  0.4 mg Oral QPC supper   traZODone  100 mg Oral QHS   Continuous Infusions:  sodium chloride     sodium chloride 10 mL/hr at 06/19/23 0900   ampicillin-sulbactam (UNASYN) IV 3 g (06/26/23 0552)     LOS: 16 days    Time spent: 35 minutes    Yolanda [name] A Serge Main, MD [facility]   If 7PM-7AM, please contact night-coverage [email or web address]  06/26/2023, 8:10 AM

## 2023-06-26 NOTE — Progress Notes (Signed)
Inpatient Rehab Admissions Coordinator:  ? ?Per therapy recommendations,  patient was screened for CIR candidacy by Laura Staley, MS, CCC-SLP. At this time, Pt. Appears to be a a potential candidate for CIR. I will place   order for rehab consult per protocol for full assessment. Please contact me any with questions. ? ?Laura Staley, MS, CCC-SLP ?Rehab Admissions Coordinator  ?[phone] (celll) ?[phone] (office) ? ?

## 2023-06-26 NOTE — Progress Notes (Signed)
Physical Therapy Treatment Patient Details Name: Yolanda [name] MRN: [id number] DOB: [birth year] Today's Date: 06/26/2023   History of Present Illness Pt is an 89 y.o. female who presented 06/10/23 with acute onset of severe headache and R-sided weakness. CT scan demonstrates evidence of a diffuse subarachnoid hemorrhage more prominent in her left sylvian fissure. 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 S/p coil embolization of L MCA aneurysm 8/19. Intubated 8/20. Cortrak placed 8/21. PMH: dementia, asthma, OA, HTN    PT Comments  Treated pt in conjunction with OT to safely progress pt with OOB mobility and begin gait training. Pt is demonstrating good progress in functional mobility and ability to follow commands today. She remains inconsistent in following cues though. She needs cues and assistance to initiate transitioning supine to sit but demonstrates improved active initiation to stand when cued, only needing minAx2 for transfers today. She was transferred to the chair from the bed using the stedy for pt safety due to her short stature, but once in the chair she was able to transfer to stand to a RW and begin gait training. She requiring minAx2 for balance and cuing to shift her weight, stabilize her knees during stance, and clear her feet during swing phase. She was impulsive to sit and needed modAx2 to bring her safely back onto the chair surface. Considering her improved activity tolerance and functional progress, she may now be a better candidate for intensive inpatient rehab, > 3 hours/day. Requested AIR coordinators to re-assess pt for AIR candidacy. Will continue to follow acutely.    If plan is discharge home, recommend the following: Two people to help with walking and/or transfers;Two people to help with bathing/dressing/bathroom;Assistance with cooking/housework;Direct supervision/assist for medications  management;Direct supervision/assist for financial management;Assistance with feeding;Assist for transportation;Help with stairs or ramp for entrance;Supervision due to cognitive status   Can travel by private vehicle     No  Equipment Recommendations  Other (comment) (defer to next venue)    Recommendations for Other Services Rehab consult     Precautions / Restrictions Precautions Precautions: Fall Precaution Comments: SBP goal <150 Restrictions Weight Bearing Restrictions: No     Mobility  Bed Mobility Overal bed mobility: Needs Assistance Bed Mobility: Rolling, Sidelying to Sit Rolling: Used rails, Min assist Sidelying to sit: Min assist, Used rails Supine to sit: Mod assist, +2 for physical assistance, +2 for safety/equipment, HOB elevated, Used rails     General bed mobility comments: Pt using rails and legs to roll self L <> R for pericare, minA to direct pt. Verbal and tactile cues provided to try to facilitate pt pushing up on the bed to ascend trunk and sit up, but pt repeatedly bringing legs back up on the bed and needed maxA to initiate trunk ascension before she actively engaged and needed modAx2 to complete the transition to sitting. Pt able to scoot anteriorly to edge with minA-CGA    Transfers Overall transfer level: Needs assistance Equipment used: Ambulation equipment used, Rolling walker (2 wheels) Transfers: Sit to/from Stand, Bed to chair/wheelchair/BSC Sit to Stand: +2 safety/equipment, +2 physical assistance, Min assist           General transfer comment: Pt needed minAx2 to come to stand from EOB to stedy 1x, from stedy flaps 1x, and from recliner to RW 1x. Verbal and tactile cues provided for hand placement and to shift weight anteriorly to stand each rep Transfer via Lift  Equipment: Antony Salmon  Ambulation/Gait Ambulation/Gait assistance: Mod assist, Min assist, +2 safety/equipment, +2 physical assistance Gait Distance (Feet): 4 Feet Assistive  device: Rolling walker (2 wheels) Gait Pattern/deviations: Step-to pattern, Decreased step length - left, Decreased step length - right, Decreased stride length, Decreased dorsiflexion - right, Decreased dorsiflexion - left, Decreased weight shift to right, Decreased weight shift to left, Knee flexed in stance - left, Knee flexed in stance - right, Shuffle, Trunk flexed Gait velocity: reduced Gait velocity interpretation: <1.31 ft/sec, indicative of household ambulator   General Gait Details: Pt ambulates with short, shuffling, unsteady steps with bil knees slightly flexed. Pt needed tactile cues to shift her weight and advance/clear her foot to step. MinAx2 for balance stepping anteriorly. Pt impulsive to sit prematurely and needing modAx2 to safely direct her to the chair.   Stairs             Wheelchair Mobility     Tilt Bed    Modified Rankin (Stroke Patients Only) Modified Rankin (Stroke Patients Only) Pre-Morbid Rankin Score: Slight disability Modified Rankin: Moderately severe disability     Balance Overall balance assessment: Needs assistance Sitting-balance support: No upper extremity supported, Feet supported Sitting balance-Leahy Scale: Fair Sitting balance - Comments: CGA to sit statically EOB and to lift bil legs into bil knee extension while sitting EOB Postural control: Right lateral lean Standing balance support: Bilateral upper extremity supported, During functional activity Standing balance-Leahy Scale: Poor Standing balance comment: R lateral lean as she fatigued, min-modAx2                            Cognition Arousal: Alert Behavior During Therapy: Flat affect Overall Cognitive Status: Impaired/Different from baseline Area of Impairment: Attention, Following commands, Safety/judgement, Awareness, Problem solving, Memory                   Current Attention Level: Focused Memory: Decreased short-term memory Following Commands:  Follows one step commands with increased time, Follows one step commands inconsistently Safety/Judgement: Decreased awareness of safety, Decreased awareness of deficits Awareness: Intellectual Problem Solving: Slow processing, Difficulty sequencing, Requires verbal cues, Requires tactile cues, Decreased initiation General Comments: Pt knew she was at a hospital and reports she is here due to a headache, but did not seem to understand her diagnosis. Follows simple commands with extra time ~70% of the time. No awareness of bowel incontinence and having her feet up in it. Poor safety awareness, sitting prematurely during gait training.        Exercises      General Comments General comments (skin integrity, edema, etc.): VSS on RA, SBP dropping from 120s to 110s with positional changes; daughter-in-law present throughout and providing interpretation for pt, declining use of video interpreter      Pertinent Vitals/Pain Pain Assessment Pain Assessment: Faces Faces Pain Scale: Hurts even more Pain Location: headache Pain Descriptors / Indicators: Discomfort, Grimacing, Moaning, Headache Pain Intervention(s): Limited activity within patient's tolerance, Monitored during session, Repositioned, Patient requesting pain meds-RN notified    Home Living                          Prior Function            PT Goals (current goals can now be found in the care plan section) Acute Rehab PT Goals Patient Stated Goal: to improve PT Goal Formulation: With patient/family Time For Goal Achievement: 06/27/23 Potential to  Achieve Goals: Fair Progress towards PT goals: Progressing toward goals    Frequency    Min 1X/week      PT Plan      Co-evaluation PT/OT/SLP Co-Evaluation/Treatment: Yes Reason for Co-Treatment: Necessary to address cognition/behavior during functional activity;For patient/therapist safety;To address functional/ADL transfers PT goals addressed during session:  Mobility/safety with mobility;Balance;Proper use of DME        AM-PAC PT "6 Clicks" Mobility   Outcome Measure  Help needed turning from your back to your side while in a flat bed without using bedrails?: A Little Help needed moving from lying on your back to sitting on the side of a flat bed without using bedrails?: Total Help needed moving to and from a bed to a chair (including a wheelchair)?: Total Help needed standing up from a chair using your arms (e.g., wheelchair or bedside chair)?: Total Help needed to walk in hospital room?: Total Help needed climbing 3-5 steps with a railing? : Total 6 Click Score: 8    End of Session Equipment Utilized During Treatment: Gait belt Activity Tolerance: Patient tolerated treatment well Patient left: with call bell/phone within reach;with family/visitor present;in chair;with chair alarm set Nurse Communication: Mobility status;Need for lift equipment;Other (comment);Patient requests pain meds (bowel incontinence) PT Visit Diagnosis: Muscle weakness (generalized) (M62.81);Difficulty in walking, not elsewhere classified (R26.2);Other symptoms and signs involving the nervous system (R29.898);Unsteadiness on feet (R26.81);Other abnormalities of gait and mobility (R26.89)     Time: 1610-9604 PT Time Calculation (min) (ACUTE ONLY): 27 min  Charges:    $Therapeutic Activity: 8-22 mins PT General Charges $$ ACUTE PT VISIT: 1 Visit                     Virgil Benedict, PT, DPT Acute Rehabilitation Services  Office: [phone]    Bettina Gavia 06/26/2023, 12:29 PM

## 2023-06-26 NOTE — Progress Notes (Signed)
Occupational Therapy Treatment Patient Details Name: Yolanda [name] MRN: [id number] DOB: [birth year] Today's Date: 06/26/2023   History of present illness Pt is an 89 y.o. female who presented 06/10/23 with acute onset of severe headache and R-sided weakness. CT scan demonstrates evidence of a diffuse subarachnoid hemorrhage more prominent in her left sylvian fissure. 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 S/p coil embolization of L MCA aneurysm 8/19. Intubated 8/20. Cortrak placed 8/21. PMH: dementia, asthma, OA, HTN   OT comments  Patient seen with PT to safely progress OOB.  Patient with bowel incontinence and no awareness of this upon entry, total assist for hygiene. With cueing for initiation, pt requires mod assist for bed mobility, min assist +2 for transfers using stedy and RW.  She remains inconsistent with following commands, at times needing repetition. She continues to use L UE more than R UE, requires cueing to [place] R UE during tasks and maintain position on RW during transfers.  She requires min assist to wash face, total assist for LB dressing.  Updated dc plan to >3hrs/day, anticipate she now has tolerance and ability to participate in this program in order to decrease burden of care prior to dc home.  Will follow.        If plan is discharge home, recommend the following:  Assistance with cooking/housework;Assistance with feeding;Direct supervision/assist for medications management;Direct supervision/assist for financial management;Assist for transportation;Help with stairs or ramp for entrance;Supervision due to cognitive status;A lot of help with bathing/dressing/bathroom;A lot of help with walking and/or transfers   Equipment Recommendations  BSC/3in1;Other (comment) (RW)    Recommendations for Other Services Rehab consult    Precautions / Restrictions Precautions Precautions:  Fall Precaution Comments: SBP goal <150 Restrictions Weight Bearing Restrictions: No       Mobility Bed Mobility Overal bed mobility: Needs Assistance Bed Mobility: Rolling, Sidelying to Sit Rolling: Used rails, Min assist Sidelying to sit: Min assist, Used rails Supine to sit: Mod assist, +2 for physical assistance, +2 for safety/equipment, HOB elevated, Used rails     General bed mobility comments: Pt using rails and legs to roll self L <> R for pericare, minA to direct pt. Verbal and tactile cues provided to try to facilitate pt pushing up on the bed to ascend trunk and sit up, but pt repeatedly bringing legs back up on the bed and needed maxA to initiate trunk ascension before she actively engaged and needed modAx2 to complete the transition to sitting. Pt able to scoot anteriorly to edge with minA-CGA    Transfers Overall transfer level: Needs assistance Equipment used: Ambulation equipment used, Rolling walker (2 wheels) Transfers: Sit to/from Stand, Bed to chair/wheelchair/BSC Sit to Stand: +2 safety/equipment, +2 physical assistance, Min assist           General transfer comment: Pt needed minAx2 to come to stand from EOB to stedy 1x, from stedy flaps 1x, and from recliner to RW 1x. Verbal and tactile cues provided for hand placement and to shift weight anteriorly to stand each rep Transfer via Lift Equipment: Stedy   Balance Overall balance assessment: Needs assistance Sitting-balance support: No upper extremity supported, Feet supported Sitting balance-Leahy Scale: Fair Sitting balance - Comments: CGA to sit statically EOB and to lift bil legs into bil knee extension while sitting EOB Postural control: Right lateral lean Standing balance support: Bilateral upper extremity supported, During functional activity Standing balance-Leahy Scale: Poor Standing balance  comment: R lateral lean as she fatigued, min-modAx2                           ADL either  performed or assessed with clinical judgement   ADL Overall ADL's : Needs assistance/impaired     Grooming: Minimal assistance;Sitting;Wash/dry face Grooming Details (indicate cue type and reason): washing face with R hand, would need assist for bimanual tasks         Upper Body Dressing : Maximal assistance;Bed level Upper Body Dressing Details (indicate cue type and reason): to change gown Lower Body Dressing: Total assistance;+2 for physical assistance;+2 for safety/equipment;Sit to/from stand   Toilet Transfer: +2 for physical assistance;+2 for safety/equipment;Ambulation;Total assistance Toilet Transfer Details (indicate cue type and reason): using stedy, but min assist +2 to stand via steady and RW         Functional mobility during ADLs: Minimal assistance;+2 for physical assistance;+2 for safety/equipment      Extremity/Trunk Assessment              Vision   Additional Comments: pt keeping eyes open, difficult to assess due to language barrier and cognition.   Perception     Praxis      Cognition Arousal: Alert Behavior During Therapy: Flat affect Overall Cognitive Status: Impaired/Different from baseline Area of Impairment: Attention, Following commands, Safety/judgement, Awareness, Problem solving, Memory                   Current Attention Level: Focused Memory: Decreased short-term memory Following Commands: Follows one step commands with increased time, Follows one step commands inconsistently Safety/Judgement: Decreased awareness of safety, Decreased awareness of deficits Awareness: Intellectual Problem Solving: Slow processing, Difficulty sequencing, Requires verbal cues, Requires tactile cues, Decreased initiation General Comments: Pt knew she was at a hospital and reports she is here due to a headache, but did not seem to understand her diagnosis. Follows simple commands with extra time ~70% of the time. No awareness of bowel incontinence  and having her feet up in it. Poor safety awareness, sitting prematurely during gait training.        Exercises      Shoulder Instructions       General Comments VSS on RA, daughter in law present and translating (declining use of interpreter)    Pertinent Vitals/ Pain       Pain Assessment Pain Assessment: Faces Faces Pain Scale: Hurts even more Pain Location: headache Pain Descriptors / Indicators: Discomfort, Grimacing, Moaning, Headache Pain Intervention(s): Limited activity within patient's tolerance, Monitored during session, Repositioned, Patient requesting pain meds-RN notified  Home Living                                          Prior Functioning/Environment              Frequency  Min 1X/week        Progress Toward Goals  OT Goals(current goals can now be found in the care plan section)  Progress towards OT goals: Progressing toward goals  Acute Rehab OT Goals Patient Stated Goal: none stated OT Goal Formulation: Patient unable to participate in goal setting Time For Goal Achievement: 06/27/23 Potential to Achieve Goals: Fair  Plan      Co-evaluation    PT/OT/SLP Co-Evaluation/Treatment: Yes Reason for Co-Treatment: Necessary to address cognition/behavior during functional activity;For patient/therapist safety;To  address functional/ADL transfers PT goals addressed during session: Mobility/safety with mobility;Balance;Proper use of DME OT goals addressed during session: ADL's and self-care      AM-PAC OT "6 Clicks" Daily Activity     Outcome Measure   Help from another person eating meals?: A Lot Help from another person taking care of personal grooming?: A Lot Help from another person toileting, which includes using toliet, bedpan, or urinal?: Total Help from another person bathing (including washing, rinsing, drying)?: A Lot Help from another person to put on and taking off regular upper body clothing?: A Lot Help from  another person to put on and taking off regular lower body clothing?: Total 6 Click Score: 10    End of Session Equipment Utilized During Treatment: Gait belt  OT Visit Diagnosis: Unsteadiness on feet (R26.81);Other abnormalities of gait and mobility (R26.89);Muscle weakness (generalized) (M62.81);Hemiplegia and hemiparesis;Other symptoms and signs involving cognitive function;Cognitive communication deficit (R41.841) Symptoms and signs involving cognitive functions: Nontraumatic SAH Hemiplegia - Right/Left: Right Hemiplegia - dominant/non-dominant: Dominant Hemiplegia - caused by: Nontraumatic SAH   Activity Tolerance Patient tolerated treatment well   Patient Left with call bell/phone within reach;with family/visitor present;in chair;with chair alarm set   Nurse Communication Mobility status        Time: 4098-1191 OT Time Calculation (min): 27 min  Charges: OT General Charges $OT Visit: 1 Visit OT Treatments $Self Care/Home Management : 8-22 mins  Barry Brunner, OT Acute Rehabilitation Services Office [phone]   Chancy Milroy 06/26/2023, 1:56 PM

## 2023-06-26 NOTE — TOC Progression Note (Signed)
Transition of Care (TOC) - Progression Note  Donn Pierini RN,BSN Transitions of Care Unit 4NP (Non Trauma)- RN Case Manager See Treatment Team for direct Phone #   Patient Details  Name: Yolanda [name] MRN: [id number] Date of Birth: January 03, 1933  Transition of Care [facility]) CM/SW Contact  Zenda Alpers Lenn Sink, RN Phone Number: 06/26/2023, 1:56 PM  Clinical Narrative:    Orders placed for [facility] PT/OT/SLP/aide- CM attempted to call son listed as first contact- Raeanne Barry- VM left for request to return call to discuss transition needs.    1240- Return call received from Bal- discussed over phone plan for pt to return home w/ [facility], confirmed family does not want SNF placement. Per Mercy Riding he is going out of town due to a death on his wife's side of the family. He voiced that pt's son will be around as well as other family members to discuss discharge needs and asked that CM leave list for [facility] at bedside. Explained that pt is nearing medical readiness for discharge.   CM went to room- family at bedside, list provided for [facility] choice Per CMS guidelines from [email or web address] website with star ratings (copy placed in shadow chart). Family to review and CM to follow up on choice. Confirmed address with family as well as contact numbers- family indicated that Mercy Riding is actually grandson and asked that [name] be the primary contact- who is pt's son- contact info updated in pt contacts in epic per family request.  Family states pt has cane at home, but would need additional DME for return home (will have PT/OT make recommendations)   Noted PT has now updated recs and are now requesting that CIR liaison re-assess for possible CIR admit- msg has been sent to liaison who has placed CIR consult. Pending review by CIR- CM will hold on any further arrangements for HH/DME.     Expected Discharge Plan: Home w Home Health Services Barriers to Discharge: Continued Medical Work up  Expected Discharge Plan and  Services In-house Referral: Clinical Social Work Discharge Planning Services: CM Consult Post Acute Care Choice: Home Health Living arrangements for the past 2 months: Single Family Home                           HH Arranged: PT, OT, Speech Therapy, Nurse's Aide           Social Determinants of Health (SDOH) Interventions SDOH Screenings   Food Insecurity: No Food Insecurity (06/19/2023)  Housing: High Risk (06/19/2023)  Transportation Needs: Patient Unable To Answer (06/19/2023)  Utilities: Patient Unable To Answer (06/19/2023)  Tobacco Use: Unknown (06/10/2023)    Readmission Risk Interventions    06/26/2023   11:55 AM  Readmission Risk Prevention Plan  Transportation Screening Complete  Home Care Screening Complete  Medication Review (RN CM) Complete

## 2023-06-26 NOTE — Progress Notes (Signed)
Calorie Count Note  48 hour calorie count ordered.  Diet: Dysphagia 3 with Thin liquids Supplements: Ensure Enlive BID  Breakfast: 320 kcal 10 grams protein Lunch: 565 kcal 16 grams protein Dinner: 435 kcal 11 grams protein Supplements: 350 kcal 20 grams protein  Total intake: 1670 kcal (>100% of minimum estimated needs)  57 protein (81%% of minimum estimated needs)  Nutrition Diagnosis: Inadequate oral intake related to inability to eat as evidenced by NPO status. - Progressing   Goal: Patient will meet greater than or equal to 90% of their needs. - Progressing  Intervention:   Cortrak tube has already been removed  Encourage PO intake at meals, spoke with daughter-in-law  Continue Ensure Enlive BID  Add Magic cup to Lunch and [employer] RD, LDN, CNSC See AMiON for contact information

## 2023-06-27 ENCOUNTER — Other Ambulatory Visit: Payer: Self-pay

## 2023-06-27 ENCOUNTER — Encounter (HOSPITAL_COMMUNITY): Payer: Self-pay | Admitting: Physical Medicine and Rehabilitation

## 2023-06-27 ENCOUNTER — Inpatient Hospital Stay (HOSPITAL_COMMUNITY)
Admission: RE | Admit: 2023-06-27 | Discharge: 2023-07-05 | DRG: 056 | Disposition: A | Discharge status: Home Health | Payer: Medicare Other | Source: Intra-hospital | Attending: Physical Medicine and Rehabilitation | Admitting: Physical Medicine and Rehabilitation

## 2023-06-27 DIAGNOSIS — R001 Bradycardia, unspecified: Secondary | ICD-10-CM | POA: Diagnosis not present

## 2023-06-27 DIAGNOSIS — I5031 Acute diastolic (congestive) heart failure: Secondary | ICD-10-CM | POA: Diagnosis not present

## 2023-06-27 DIAGNOSIS — F05 Delirium due to known physiological condition: Secondary | ICD-10-CM | POA: Diagnosis present

## 2023-06-27 DIAGNOSIS — R339 Retention of urine, unspecified: Secondary | ICD-10-CM | POA: Diagnosis present

## 2023-06-27 DIAGNOSIS — R410 Disorientation, unspecified: Secondary | ICD-10-CM

## 2023-06-27 DIAGNOSIS — I110 Hypertensive heart disease with heart failure: Secondary | ICD-10-CM | POA: Diagnosis present

## 2023-06-27 DIAGNOSIS — R1310 Dysphagia, unspecified: Secondary | ICD-10-CM | POA: Diagnosis present

## 2023-06-27 DIAGNOSIS — E785 Hyperlipidemia, unspecified: Secondary | ICD-10-CM | POA: Diagnosis present

## 2023-06-27 DIAGNOSIS — I5033 Acute on chronic diastolic (congestive) heart failure: Secondary | ICD-10-CM | POA: Diagnosis present

## 2023-06-27 DIAGNOSIS — Z79899 Other long term (current) drug therapy: Secondary | ICD-10-CM

## 2023-06-27 DIAGNOSIS — D6489 Other specified anemias: Secondary | ICD-10-CM | POA: Diagnosis present

## 2023-06-27 DIAGNOSIS — F0390 Unspecified dementia without behavioral disturbance: Secondary | ICD-10-CM | POA: Diagnosis present

## 2023-06-27 DIAGNOSIS — I69018 Other symptoms and signs involving cognitive functions following nontraumatic subarachnoid hemorrhage: Principal | ICD-10-CM

## 2023-06-27 DIAGNOSIS — M19072 Primary osteoarthritis, left ankle and foot: Secondary | ICD-10-CM | POA: Diagnosis present

## 2023-06-27 DIAGNOSIS — R7303 Prediabetes: Secondary | ICD-10-CM | POA: Diagnosis present

## 2023-06-27 DIAGNOSIS — G4700 Insomnia, unspecified: Secondary | ICD-10-CM | POA: Diagnosis not present

## 2023-06-27 DIAGNOSIS — I69091 Dysphagia following nontraumatic subarachnoid hemorrhage: Secondary | ICD-10-CM | POA: Diagnosis not present

## 2023-06-27 DIAGNOSIS — R519 Headache, unspecified: Secondary | ICD-10-CM | POA: Diagnosis present

## 2023-06-27 DIAGNOSIS — Z9889 Other specified postprocedural states: Secondary | ICD-10-CM

## 2023-06-27 DIAGNOSIS — I10 Essential (primary) hypertension: Secondary | ICD-10-CM | POA: Diagnosis not present

## 2023-06-27 DIAGNOSIS — E8809 Other disorders of plasma-protein metabolism, not elsewhere classified: Secondary | ICD-10-CM | POA: Diagnosis present

## 2023-06-27 DIAGNOSIS — I609 Nontraumatic subarachnoid hemorrhage, unspecified: Secondary | ICD-10-CM

## 2023-06-27 DIAGNOSIS — Z7902 Long term (current) use of antithrombotics/antiplatelets: Secondary | ICD-10-CM | POA: Diagnosis not present

## 2023-06-27 DIAGNOSIS — Z7982 Long term (current) use of aspirin: Secondary | ICD-10-CM

## 2023-06-27 DIAGNOSIS — R197 Diarrhea, unspecified: Secondary | ICD-10-CM | POA: Diagnosis present

## 2023-06-27 DIAGNOSIS — J4520 Mild intermittent asthma, uncomplicated: Secondary | ICD-10-CM | POA: Diagnosis present

## 2023-06-27 DIAGNOSIS — R739 Hyperglycemia, unspecified: Secondary | ICD-10-CM | POA: Diagnosis present

## 2023-06-27 DIAGNOSIS — M19071 Primary osteoarthritis, right ankle and foot: Secondary | ICD-10-CM | POA: Diagnosis present

## 2023-06-27 DIAGNOSIS — D72829 Elevated white blood cell count, unspecified: Secondary | ICD-10-CM

## 2023-06-27 DIAGNOSIS — M170 Bilateral primary osteoarthritis of knee: Secondary | ICD-10-CM | POA: Diagnosis present

## 2023-06-27 DIAGNOSIS — R609 Edema, unspecified: Secondary | ICD-10-CM | POA: Diagnosis not present

## 2023-06-27 DIAGNOSIS — J690 Pneumonitis due to inhalation of food and vomit: Secondary | ICD-10-CM | POA: Diagnosis present

## 2023-06-27 DIAGNOSIS — Z7951 Long term (current) use of inhaled steroids: Secondary | ICD-10-CM | POA: Diagnosis not present

## 2023-06-27 LAB — GLUCOSE, CAPILLARY
Glucose-Capillary: 113 mg/dL — ABNORMAL HIGH (ref 70–99)
Glucose-Capillary: 115 mg/dL — ABNORMAL HIGH (ref 70–99)

## 2023-06-27 LAB — CBC
HCT: 32.6 % — ABNORMAL LOW (ref 36.0–46.0)
Hemoglobin: 10.3 g/dL — ABNORMAL LOW (ref 12.0–15.0)
MCH: 28.1 pg (ref 26.0–34.0)
MCHC: 31.6 g/dL (ref 30.0–36.0)
MCV: 89.1 fL (ref 80.0–100.0)
Platelets: 361 10*3/uL (ref 150–400)
RBC: 3.66 MIL/uL — ABNORMAL LOW (ref 3.87–5.11)
RDW: 14.4 % (ref 11.5–15.5)
WBC: 10.9 10*3/uL — ABNORMAL HIGH (ref 4.0–10.5)
nRBC: 0 % (ref 0.0–0.2)

## 2023-06-27 LAB — CREATININE, SERUM
Creatinine, Ser: 0.6 mg/dL (ref 0.44–1.00)
GFR, Estimated: >60 mL/min (ref >60)

## 2023-06-27 MED ORDER — HEPARIN SODIUM (PORCINE) 5000 UNIT/ML IJ SOLN
5000.0000 [IU] | Freq: Three times a day (TID) | INTRAMUSCULAR | Status: DC
  Administered 2023-06-27 – 2023-07-05 (×23): 5000 [IU] via SUBCUTANEOUS
  Filled 2023-06-27 (×23): qty 1

## 2023-06-27 MED ORDER — ACETAMINOPHEN 325 MG PO TABS
325.0000 mg | ORAL_TABLET | ORAL | Status: DC | PRN
  Administered 2023-06-27 – 2023-06-28 (×5): 650 mg via ORAL
  Filled 2023-06-27 (×5): qty 2

## 2023-06-27 MED ORDER — RAMELTEON 8 MG PO TABS: 8.0000 mg | ORAL_TABLET | Freq: Every day | ORAL | 0 refills | Status: DC

## 2023-06-27 MED ORDER — ONDANSETRON HCL 4 MG/2ML IJ SOLN: 4.0000 mg | Freq: Four times a day (QID) | INTRAMUSCULAR | Status: DC | PRN

## 2023-06-27 MED ORDER — LIDOCAINE HCL URETHRAL/MUCOSAL 2 % EX GEL: 1.0000 | CUTANEOUS | Status: DC | PRN

## 2023-06-27 MED ORDER — POLYETHYLENE GLYCOL 3350 17 G PO PACK: 17.0000 g | PACK | Freq: Every day | ORAL | Status: DC | PRN

## 2023-06-27 MED ORDER — INFLUENZA VAC A&B SURF ANT ADJ 0.5 ML IM SUSY
0.5000 mL | PREFILLED_SYRINGE | INTRAMUSCULAR | Status: DC
  Filled 2023-06-27: qty 0.5

## 2023-06-27 MED ORDER — NIMODIPINE 30 MG PO CAPS
60.0000 mg | ORAL_CAPSULE | ORAL | Status: AC
  Administered 2023-06-27 – 2023-07-01 (×24): 60 mg via ORAL
  Filled 2023-06-27 (×25): qty 2

## 2023-06-27 MED ORDER — ACETAMINOPHEN 500 MG PO TABS: 500.0000 mg | ORAL_TABLET | Freq: Three times a day (TID) | ORAL | 0 refills | Status: DC

## 2023-06-27 MED ORDER — TAMSULOSIN HCL 0.4 MG PO CAPS
0.4000 mg | ORAL_CAPSULE | Freq: Every day | ORAL | Status: DC
  Administered 2023-06-27 – 2023-07-03 (×7): 0.4 mg via ORAL
  Filled 2023-06-27 (×8): qty 1

## 2023-06-27 MED ORDER — BISOPROLOL FUMARATE 5 MG PO TABS: 5.0000 mg | ORAL_TABLET | Freq: Every day | ORAL | 0 refills | Status: DC

## 2023-06-27 MED ORDER — GUAIFENESIN-DM 100-10 MG/5ML PO SYRP: 10.0000 mL | ORAL_SOLUTION | Freq: Four times a day (QID) | ORAL | Status: DC | PRN

## 2023-06-27 MED ORDER — BISACODYL 5 MG PO TBEC: 5.0000 mg | DELAYED_RELEASE_TABLET | Freq: Every day | ORAL | Status: DC | PRN

## 2023-06-27 MED ORDER — TAMSULOSIN HCL 0.4 MG PO CAPS: 0.4000 mg | ORAL_CAPSULE | Freq: Every day | ORAL | 0 refills | Status: DC

## 2023-06-27 MED ORDER — SERTRALINE HCL 25 MG PO TABS: 25.0000 mg | ORAL_TABLET | Freq: Every day | ORAL | 0 refills | Status: DC

## 2023-06-27 MED ORDER — NIMODIPINE 6 MG/ML PO SOLN
60.0000 mg | ORAL | Status: DC
  Administered 2023-06-27: 60 mg via ORAL
  Filled 2023-06-27 (×6): qty 10

## 2023-06-27 MED ORDER — SACCHAROMYCES BOULARDII 250 MG PO CAPS
250.0000 mg | ORAL_CAPSULE | Freq: Two times a day (BID) | ORAL | Status: DC
  Administered 2023-06-27 – 2023-07-05 (×16): 250 mg via ORAL
  Filled 2023-06-27 (×16): qty 1

## 2023-06-27 MED ORDER — PNEUMOCOCCAL 20-VAL CONJ VACC 0.5 ML IM SUSY
0.5000 mL | PREFILLED_SYRINGE | INTRAMUSCULAR | Status: DC
  Filled 2023-06-27: qty 0.5

## 2023-06-27 MED ORDER — FLEET ENEMA RE ENEM: 1.0000 | ENEMA | Freq: Once | RECTAL | Status: DC | PRN

## 2023-06-27 MED ORDER — BUDESONIDE 0.5 MG/2ML IN SUSP
0.5000 mg | Freq: Two times a day (BID) | RESPIRATORY_TRACT | Status: DC
  Administered 2023-06-27 – 2023-07-05 (×14): 0.5 mg via RESPIRATORY_TRACT
  Filled 2023-06-27 (×14): qty 2

## 2023-06-27 MED ORDER — HEPARIN SODIUM (PORCINE) 5000 UNIT/ML IJ SOLN: 5000.0000 [IU] | Freq: Three times a day (TID) | INTRAMUSCULAR | Status: DC

## 2023-06-27 MED ORDER — BISOPROLOL FUMARATE 5 MG PO TABS
5.0000 mg | ORAL_TABLET | Freq: Every day | ORAL | Status: DC
  Administered 2023-06-28 – 2023-07-01 (×4): 5 mg via ORAL
  Filled 2023-06-27 (×4): qty 1

## 2023-06-27 MED ORDER — TRAZODONE HCL 50 MG PO TABS
100.0000 mg | ORAL_TABLET | Freq: Every day | ORAL | Status: DC
  Administered 2023-06-27 – 2023-07-04 (×8): 100 mg via ORAL
  Filled 2023-06-27 (×8): qty 2

## 2023-06-27 MED ORDER — TRAZODONE HCL 100 MG PO TABS: 100.0000 mg | ORAL_TABLET | Freq: Every day | ORAL | 0 refills | Status: DC

## 2023-06-27 MED ORDER — ONDANSETRON HCL 4 MG PO TABS: 4.0000 mg | ORAL_TABLET | Freq: Four times a day (QID) | ORAL | Status: DC | PRN

## 2023-06-27 MED ORDER — ARFORMOTEROL TARTRATE 15 MCG/2ML IN NEBU
15.0000 ug | INHALATION_SOLUTION | Freq: Two times a day (BID) | RESPIRATORY_TRACT | Status: DC
  Administered 2023-06-27 – 2023-07-05 (×14): 15 ug via RESPIRATORY_TRACT
  Filled 2023-06-27 (×14): qty 2

## 2023-06-27 MED ORDER — NYSTATIN 100000 UNIT/ML MT SUSP
5.0000 mL | Freq: Four times a day (QID) | OROMUCOSAL | Status: AC
  Administered 2023-06-27 – 2023-07-02 (×16): 500000 [IU] via ORAL
  Filled 2023-06-27 (×16): qty 5

## 2023-06-27 MED ORDER — BUDESONIDE 0.5 MG/2ML IN SUSP: 0.5000 mg | Freq: Two times a day (BID) | RESPIRATORY_TRACT | 12 refills | Status: DC

## 2023-06-27 MED ORDER — ENSURE ENLIVE PO LIQD
237.0000 mL | Freq: Two times a day (BID) | ORAL | Status: DC
  Administered 2023-06-28 – 2023-06-30 (×5): 237 mL via ORAL

## 2023-06-27 MED ORDER — ACETAMINOPHEN 500 MG PO TABS
500.0000 mg | ORAL_TABLET | Freq: Three times a day (TID) | ORAL | Status: DC
  Administered 2023-06-27: 500 mg via ORAL
  Filled 2023-06-27: qty 1

## 2023-06-27 MED ORDER — SACCHAROMYCES BOULARDII 250 MG PO CAPS: 250.0000 mg | ORAL_CAPSULE | Freq: Two times a day (BID) | ORAL | 0 refills | Status: DC

## 2023-06-27 MED ORDER — NIMODIPINE 30 MG PO CAPS: 60.0000 mg | ORAL_CAPSULE | ORAL | 0 refills | Status: DC

## 2023-06-27 MED ORDER — RAMELTEON 8 MG PO TABS
8.0000 mg | ORAL_TABLET | Freq: Every day | ORAL | Status: DC
  Administered 2023-06-27 – 2023-07-04 (×8): 8 mg via ORAL
  Filled 2023-06-27 (×9): qty 1

## 2023-06-27 MED ORDER — SERTRALINE HCL 50 MG PO TABS
25.0000 mg | ORAL_TABLET | Freq: Every day | ORAL | Status: DC
  Administered 2023-06-28 – 2023-07-05 (×8): 25 mg via ORAL
  Filled 2023-06-27 (×8): qty 1

## 2023-06-27 MED ORDER — ALUM & MAG HYDROXIDE-SIMETH 200-200-20 MG/5ML PO SUSP: 30.0000 mL | ORAL | Status: DC | PRN

## 2023-06-27 MED ORDER — ARFORMOTEROL TARTRATE 15 MCG/2ML IN NEBU: 15.0000 ug | INHALATION_SOLUTION | Freq: Two times a day (BID) | RESPIRATORY_TRACT | 0 refills | Status: DC

## 2023-06-27 NOTE — Discharge Summary (Signed)
Physician Discharge Summary   Patient: Yolanda [name] MRN: [id number] DOB: 1933-02-05  Admit date:     06/10/2023  Discharge date: 06/27/23  Discharge Physician: Alba Cory   PCP: Pcp, No   Recommendations at discharge:   Transfer to CIR>  Patient needs to complete Nimodipine , last day 9/9  Discharge Diagnoses: Principal Problem:   Subarachnoid hemorrhage (HCC) Active Problems:   Acute respiratory failure with hypoxia (HCC)  Resolved Problems:   * No resolved hospital problems. *  Hospital Course: 89 year old with past medical history significant for asthma, dementia, essential hypertension presented to the ED with sudden onset of severe headache, nausea vomiting.  Associated with right-sided weakness, photophobia and facial droop.  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  Neurosurgery consulted and PCCM asked to admit patient to the ICU.  Patient underwent coiling of the left MCA aneurysm.  Patient also required  intubation. .  She developed stridor after extubation which was treated with racemic epinephrine and Decadron.  Respiratory status improved.  She was transferred to progressive care floor. Patient has been able to tolerate diet. Core track tube removes. Family would not want to proceed with PEG neither.      Assessment and Plan: 1-Subarachnoid hemorrhage/aneurysmal rupture -Evaluated by neurosurgery.  Initially admitted to the ICU. -Underwent coil embolization of the left MCA aneurysmal -Neurosurgery recommends 21 days course of nimodipine which will end 07/01/2023. -Plan to transfer to CIR today  Acute delirium: Off Precedex since 8/27 Patient noted to be on Zoloft and trazodone and Rozerem Trazodone was increased during this hospitalization Delirium improving   Acute on chronic diastolic CHF/acute pulmonary edema/history of essential hypertension prolonged QT Echocardiogram showed normal  ejection fraction Received times 1 lasix on 8/28   Aspiration  pneumonia: Chest x-ray showed right lung opacity, Leukocytosis.  Started on Unasyn 8/29 Completed 7 days of antibiotics.    Dysphagia: She required core track  Osmolite for nutrition.  On dysphagia 3 diet Per family,  patient was eating small amounts at home even prior to this admission.  Family does not want to proceed with PEG tube placement. Core track removed 9/04. Tolerating diet.   Diarrhea: Likely setting of tube feeding Start florastore as well.    Hypokalemia: replaced.    History of asthma: On Brovana, pulicort.  PRN nebulizer.    Steroid-induced hyperglycemia: Completed steroids.    Anemia of critical illness: Hb stable.    Leukocytosis; Related to PNA and steroids.    Urinary retention: Status post 3 In-N-Out cath.  Started on Flomax         Consultants: Neurosurgery CCM Procedures performed: aneurysmal coiling  Disposition: Rehabilitation facility Diet recommendation:  Discharge Diet Orders (From admission, onward)     Start     Ordered   06/27/23 0000  Diet - low sodium heart healthy        06/27/23 1134           Dysphagia type 3   Liquid DISCHARGE MEDICATION: Allergies as of 06/27/2023       Reactions   Meat Extract Other (See Comments)   Pt vegetarian         Medication List     STOP taking these medications    celecoxib 200 MG capsule Commonly known as: CELEBREX       TAKE these medications    acetaminophen 500 MG tablet Commonly known as: TYLENOL Take 1 tablet (  500 mg total) by mouth 3 (three) times daily.   arformoterol 15 MCG/2ML Nebu Commonly known as: BROVANA Take 2 mLs (15 mcg total) by nebulization 2 (two) times daily.   bisoprolol 5 MG tablet Commonly known as: ZEBETA Take 1 tablet (5 mg total) by mouth daily. Start taking on: June 28, 2023   budesonide 0.5 MG/2ML nebulizer solution Commonly known as: PULMICORT Take 2 mLs (0.5 mg  total) by nebulization 2 (two) times daily.   niMODipine 30 MG capsule Commonly known as: NIMOTOP Take 2 capsules (60 mg total) by mouth every 4 (four) hours for 4 days.   ramelteon 8 MG tablet Commonly known as: ROZEREM Take 1 tablet (8 mg total) by mouth at bedtime.   saccharomyces boulardii 250 MG capsule Commonly known as: FLORASTOR Take 1 capsule (250 mg total) by mouth 2 (two) times daily.   sertraline 25 MG tablet Commonly known as: ZOLOFT Take 1 tablet (25 mg total) by mouth daily. Start taking on: June 28, 2023   tamsulosin 0.4 MG Caps capsule Commonly known as: FLOMAX Take 1 capsule (0.4 mg total) by mouth daily after supper.   traZODone 100 MG tablet Commonly known as: DESYREL Take 1 tablet (100 mg total) by mouth at bedtime.        Follow-up Information     Lisbeth Renshaw, MD Follow up in 4 week(s).   Specialty: Neurosurgery Contact information: [address] [place] [address] [phone]         Annett Fabian, MD Follow up.   Specialty: Internal Medicine Why: TIME : 8:30 AM DATE : SEPT 19 , 2024 LOCATION :  [facility] , [facility] FLOOR [address]  [place], [place] [address] Contact information: [address] [place] [address] [phone]                Discharge Exam: Filed Weights   06/25/23 0404 06/26/23 0323 06/27/23 0357  Weight: 63.2 kg 57.6 kg 56.9 kg   General; Alert. Report headaches is stable.   Condition at discharge: stable  The results of significant diagnostics from this hospitalization (including imaging, microbiology, ancillary and laboratory) are listed below for reference.   Imaging Studies: VAS [place] TRANSCRANIAL DOPPLER  Result Date: 06/25/2023  Transcranial Doppler Patient Name:  Yolanda [name]  Date of Exam:   06/21/2023 Medical Rec #: [id number]     Accession #:    [id number] Date of Birth: 11/16/1932     Patient Gender: F Patient Age:   28 years Exam  Location:  [facility] Procedure:      VAS [place] TRANSCRANIAL DOPPLER Referring Phys: Maralyn Sago GROCE --------------------------------------------------------------------------------  Indications: Subarachnoid hemorrhage. Limitations: Today's examination was technically difficult. Constant patient              movement/patient agitated. Performing Technologist: Jean Rosenthal RDMS, RVT  Examination Guidelines: A complete evaluation includes B-mode imaging, spectral Doppler, color Doppler, and power Doppler as needed of all accessible portions of each vessel. Bilateral testing is considered an integral part of a complete examination. Limited examinations for reoccurring indications may be performed as noted.  +----------+---------------+----------+-----------+------------------+ RIGHT TCD Right VM (cm/s)Depth (cm)Pulsatility     Comment       +----------+---------------+----------+-----------+------------------+ MCA             48                    1.44                       +----------+---------------+----------+-----------+------------------+  ACA             -28                   1.25                       +----------+---------------+----------+-----------+------------------+ Term ICA        23                    1.52                       +----------+---------------+----------+-----------+------------------+ PCA P1          17                    1.50                       +----------+---------------+----------+-----------+------------------+ Opthalmic       24                    1.68                       +----------+---------------+----------+-----------+------------------+ ICA siphon                                    Unable to insonate +----------+---------------+----------+-----------+------------------+ Vertebral       -16                   1.66                       +----------+---------------+----------+-----------+------------------+ Distal ICA      21                                                +----------+---------------+----------+-----------+------------------+  +----------+--------------+----------+-----------+-------+ LEFT TCD  Left VM (cm/s)Depth (cm)PulsatilityComment +----------+--------------+----------+-----------+-------+ MCA             41                                   +----------+--------------+----------+-----------+-------+ ACA            -27                   1.53            +----------+--------------+----------+-----------+-------+ Term ICA        40                   1.23            +----------+--------------+----------+-----------+-------+ PCA P1         -24                   1.44            +----------+--------------+----------+-----------+-------+ Opthalmic       20                   1.85            +----------+--------------+----------+-----------+-------+ ICA siphon      32  1.51            +----------+--------------+----------+-----------+-------+ Vertebral      -16                   1.28            +----------+--------------+----------+-----------+-------+ Distal ICA      14                                   +----------+--------------+----------+-----------+-------+  +------------+-------+-------+             VM cm/sComment +------------+-------+-------+ Prox Basilar  -25          +------------+-------+-------+ Dist Basilar  -27          +------------+-------+-------+ +----------------------+---+ Right Lindegaard Ratio2.3 +----------------------+---+ +---------------------+---+ Left Lindegaard Ratio2.9 +---------------------+---+  Summary: This was a normal transcranial Doppler study, with normal flow direction and velocity of all identified vessels of the anterior and posterior circulations, with no evidence of stenosis, vasospasm or occlusion. There was no evidence of intracranial disease. Globally elevated pulsatility indices suggest diffuse  increase in intracranial pressure or generalized atherosclerosis *See table(s) above for TCD measurements and observations.  Diagnosing physician: Delia Heady MD Electronically signed by Delia Heady MD on 06/25/2023 at 5:42:38 PM.    Final    VAS [place] TRANSCRANIAL DOPPLER  Result Date: 06/20/2023  Transcranial Doppler Patient Name:  Yolanda [name]  Date of Exam:   06/19/2023 Medical Rec #: [id number]     Accession #:    [id number] Date of Birth: 06/26/1933     Patient Gender: F Patient Age:   55 years Exam Location:  [facility] Procedure:      VAS [place] TRANSCRANIAL DOPPLER Referring Phys: Maralyn Sago GROCE --------------------------------------------------------------------------------  Indications: Subarachnoid hemorrhage. Comparison Study: Previous exam 06/17/2023 Performing Technologist: Ernestene Mention RVT, RDMS  Examination Guidelines: A complete evaluation includes B-mode imaging, spectral Doppler, color Doppler, and power Doppler as needed of all accessible portions of each vessel. Bilateral testing is considered an integral part of a complete examination. Limited examinations for reoccurring indications may be performed as noted.  +----------+---------------+----------+-----------+-------------+ RIGHT TCD Right VM (cm/s)Depth (cm)Pulsatility   Comment    +----------+---------------+----------+-----------+-------------+ MCA             48                    1.57                  +----------+---------------+----------+-----------+-------------+ ACA                                           not insonated +----------+---------------+----------+-----------+-------------+ Term ICA        27                    1.55                  +----------+---------------+----------+-----------+-------------+ PCA P1          21                    1.54                  +----------+---------------+----------+-----------+-------------+ Opthalmic       17  2.00                   +----------+---------------+----------+-----------+-------------+ ICA siphon                                    not insonated +----------+---------------+----------+-----------+-------------+ Vertebral       -18                   1.47                  +----------+---------------+----------+-----------+-------------+ Distal ICA      -20                   1.74                  +----------+---------------+----------+-----------+-------------+  +----------+--------------+----------+-----------+-------------+ LEFT TCD  Left VM (cm/s)Depth (cm)Pulsatility   Comment    +----------+--------------+----------+-----------+-------------+ MCA             22                   1.54                  +----------+--------------+----------+-----------+-------------+ ACA                                          not insonated +----------+--------------+----------+-----------+-------------+ Term ICA        25                   1.54                  +----------+--------------+----------+-----------+-------------+ PCA P1          26                   1.57                  +----------+--------------+----------+-----------+-------------+ Opthalmic       16                   1.94                  +----------+--------------+----------+-----------+-------------+ ICA siphon      25                   1.69                  +----------+--------------+----------+-----------+-------------+ Vertebral      -15                   1.24                  +----------+--------------+----------+-----------+-------------+ Distal ICA     -19                   1.60                  +----------+--------------+----------+-----------+-------------+  +------------+-------+-------+             VM cm/sComment +------------+-------+-------+ Prox Basilar  -24   1.64   +------------+-------+-------+ Dist Basilar  -26   1.63   +------------+-------+-------+ +----------------------+----+  Right Lindegaard Ratio2.40 +----------------------+----+ +---------------------+----+ Left Lindegaard Ratio1.16 +---------------------+----+  Summary:  Low mean flow velocities in left middle cerebral artery suggestive of distal stenosis.  Globally elevated pulsatility indices likely suggest diffuse intracranial atherosclerosis *See table(s) above for TCD measurements and observations.  Diagnosing physician: Delia Heady MD Electronically signed by Delia Heady MD on 06/20/2023 at 12:18:39 PM.    Final    VAS [place] TRANSCRANIAL DOPPLER  Result Date: 06/18/2023  Transcranial Doppler Patient Name:  Yolanda [name]  Date of Exam:   06/17/2023 Medical Rec #: [id number]     Accession #:    [id number] Date of Birth: [birth year]     Patient Gender: F Patient Age:   63 years Exam Location:  [facility] Procedure:      VAS [place] TRANSCRANIAL DOPPLER Referring Phys: Maralyn Sago GROCE --------------------------------------------------------------------------------  Indications: Subarachnoid hemorrhage. Limitations: Technically limited study due to patient frequent movement and              patient's position. Limitations for diagnostic windows: Unable to insonate right transtemporal window. Performing Technologist: Marilynne Halsted RDMS, RVT  Examination Guidelines: A complete evaluation includes B-mode imaging, spectral Doppler, color Doppler, and power Doppler as needed of all accessible portions of each vessel. Bilateral testing is considered an integral part of a complete examination. Limited examinations for reoccurring indications may be performed as noted.  +----------+---------------+----------+-----------+-------------+ RIGHT TCD Right VM (cm/s)Depth (cm)Pulsatility   Comment    +----------+---------------+----------+-----------+-------------+ MCA                                           not insonated +----------+---------------+----------+-----------+-------------+ ACA                                            not insonated +----------+---------------+----------+-----------+-------------+ Term ICA                                      not insonated +----------+---------------+----------+-----------+-------------+ PCA P1                                        not insonated +----------+---------------+----------+-----------+-------------+ Opthalmic       29                                          +----------+---------------+----------+-----------+-------------+ ICA siphon                                    not insonated +----------+---------------+----------+-----------+-------------+ Vertebral       -29                                         +----------+---------------+----------+-----------+-------------+ Distal ICA                                    not insonated +----------+---------------+----------+-----------+-------------+  +----------+--------------+----------+-----------+-------------+ LEFT TCD  Left VM (cm/s)Depth (cm)Pulsatility   Comment    +----------+--------------+----------+-----------+-------------+ MCA  64                                         +----------+--------------+----------+-----------+-------------+ ACA            -34                                         +----------+--------------+----------+-----------+-------------+ Term ICA                                     not insonated +----------+--------------+----------+-----------+-------------+ PCA P1         -44                                P2       +----------+--------------+----------+-----------+-------------+ Opthalmic       24                                         +----------+--------------+----------+-----------+-------------+ ICA siphon                                   not insonated +----------+--------------+----------+-----------+-------------+ Vertebral      -29                                          +----------+--------------+----------+-----------+-------------+ Distal ICA                                   not insonated +----------+--------------+----------+-----------+-------------+  +------------+-------+-------------+             VM cm/s   Comment    +------------+-------+-------------+ Prox Basilar       not insonated +------------+-------+-------------+ Summary:  Absent right temporal and poor suboccipital window limits evaluation.Normal mean flow velocities in identified vessels of anterior and posterior cerebral circulations without evidence of definite vasospasm noted. *See table(s) above for TCD measurements and observations.  Diagnosing physician: Delia Heady MD Electronically signed by Delia Heady MD on 06/18/2023 at 5:28:18 PM.    Final    VAS [place] TRANSCRANIAL DOPPLER  Result Date: 06/18/2023  Transcranial Doppler Patient Name:  Yolanda [name]  Date of Exam:   06/14/2023 Medical Rec #: [id number]     Accession #:    [id number] Date of Birth: April 05, 1933     Patient Gender: F Patient Age:   89 years Exam Location:  [facility] Procedure:      VAS [place] TRANSCRANIAL DOPPLER Referring Phys: Maralyn Sago GROCE --------------------------------------------------------------------------------  Indications: Subarachnoid hemorrhage. Limitations for diagnostic windows: Unable to insonate occipital window. Comparison Study: 06/12/23 Performing Technologist: Shona Simpson  Examination Guidelines: A complete evaluation includes B-mode imaging, spectral Doppler, color Doppler, and power Doppler as needed of all accessible portions of each vessel. Bilateral testing is considered an integral part of a complete examination. Limited examinations for reoccurring indications may be performed as noted.  +----------+---------------+----------+-----------+-------+  RIGHT TCD Right VM (cm/s)Depth (cm)PulsatilityComment +----------+---------------+----------+-----------+-------+ MCA             74                      1.8            +----------+---------------+----------+-----------+-------+ ACA             48                     2.1            +----------+---------------+----------+-----------+-------+ Term ICA        45                     1.9            +----------+---------------+----------+-----------+-------+ PCA P1          24                     1.7            +----------+---------------+----------+-----------+-------+ Opthalmic       36                     1.9            +----------+---------------+----------+-----------+-------+ ICA siphon      26                     2.1            +----------+---------------+----------+-----------+-------+ Distal ICA      18                     1.6            +----------+---------------+----------+-----------+-------+  +----------+--------------+----------+-----------+-------+ LEFT TCD  Left VM (cm/s)Depth (cm)PulsatilityComment +----------+--------------+----------+-----------+-------+ MCA             32                    2.3            +----------+--------------+----------+-----------+-------+ ACA             70                                   +----------+--------------+----------+-----------+-------+ Term ICA        39                    2.0            +----------+--------------+----------+-----------+-------+ PCA P1          25                    1.8            +----------+--------------+----------+-----------+-------+ Opthalmic       24                    1.9            +----------+--------------+----------+-----------+-------+ ICA siphon      47                    1.6            +----------+--------------+----------+-----------+-------+ Distal ICA      19  1.6            +----------+--------------+----------+-----------+-------+  +----------------------+---+ Right Lindegaard Ratio4.1 +----------------------+---+ +---------------------+----+ Left Lindegaard  Ratio1.68 +---------------------+----+  Summary:  Mildly elevated right middle cerebral and left anterior cerebral artery mean flow velocitie sof unclear significance. globally elevate dpulsatility indices suggest diffuse increase in intracranial pressure versus increased intracranial atherosclerosis *See table(s) above for TCD measurements and observations.  Diagnosing physician: Delia Heady MD Electronically signed by Delia Heady MD on 06/18/2023 at 5:26:39 PM.    Final    VAS [place] TRANSCRANIAL DOPPLER  Result Date: 06/18/2023  Transcranial Doppler Patient Name:  Yolanda [name]  Date of Exam:   06/12/2023 Medical Rec #: [id number]     Accession #:    [id number] Date of Birth: Mar 30, 1933     Patient Gender: F Patient Age:   73 years Exam Location:  [facility] Procedure:      VAS [place] TRANSCRANIAL DOPPLER Referring Phys: Maralyn Sago GROCE --------------------------------------------------------------------------------  Indications: Subarachnoid hemorrhage. Limitations for diagnostic windows: Unable to insonate occipital window. Comparison Study: No prior study Performing Technologist: Shona Simpson  Examination Guidelines: A complete evaluation includes B-mode imaging, spectral Doppler, color Doppler, and power Doppler as needed of all accessible portions of each vessel. Bilateral testing is considered an integral part of a complete examination. Limited examinations for reoccurring indications may be performed as noted.  +----------+---------------+----------+-----------+-------+ RIGHT TCD Right VM (cm/s)Depth (cm)PulsatilityComment +----------+---------------+----------+-----------+-------+ MCA             57                     1.5            +----------+---------------+----------+-----------+-------+ ACA             47                     1.8            +----------+---------------+----------+-----------+-------+ Term ICA        44                     1.5             +----------+---------------+----------+-----------+-------+ PCA P1          37                     1.6            +----------+---------------+----------+-----------+-------+ Opthalmic       56                     2.0            +----------+---------------+----------+-----------+-------+ ICA siphon      24                     2.1            +----------+---------------+----------+-----------+-------+ Distal ICA      23                     2.1            +----------+---------------+----------+-----------+-------+  +----------+--------------+----------+-----------+-------+ LEFT TCD  Left VM (cm/s)Depth (cm)PulsatilityComment +----------+--------------+----------+-----------+-------+ MCA             71                    1.5            +----------+--------------+----------+-----------+-------+  ACA             37                    1.5            +----------+--------------+----------+-----------+-------+ Term ICA        33                    1.5            +----------+--------------+----------+-----------+-------+ PCA P1          21                    1.6            +----------+--------------+----------+-----------+-------+ Opthalmic       20                    2.2            +----------+--------------+----------+-----------+-------+ ICA siphon      13                    1.5            +----------+--------------+----------+-----------+-------+  +----------------------+---+ Right Lindegaard Ratio2.5 +----------------------+---+ +---------------------+---+ Left Lindegaard RatioN/A +---------------------+---+  Summary: This was a normal transcranial Doppler study, with normal flow direction and velocity of all identified vessels of the anterior and posterior circulations, with no evidence of stenosis, vasospasm or occlusion. There was no evidence of intracranial disease. Unable to visualize occipital window and left distal ICA due to patient inability to  reposition. Diffusely elevated pulsatility indices suggest duffuse vasospasm or intracranial atherosclerosis *See table(s) above for TCD measurements and observations.  Diagnosing physician: Delia Heady MD Electronically signed by Delia Heady MD on 06/18/2023 at 5:24:44 PM.    Final    DG Chest Port 1 View  Result Date: 06/18/2023 CLINICAL DATA:  Dyspnea. EXAM: PORTABLE CHEST 1 VIEW COMPARISON:  June 14, 2023. FINDINGS: Stable cardiomegaly. Endotracheal tube has been removed. Feeding tube is seen entering stomach. Significantly increased right lung opacity is noted concerning for worsening edema or pneumonia with probable small pleural effusion. Mild left basilar opacity is noted concerning for atelectasis or infiltrate. Bony thorax is unremarkable. IMPRESSION: Endotracheal tube has been removed. Significantly increased right lung opacity is noted as described above. Electronically Signed   By: Lupita Raider M.D.   On: 06/18/2023 09:54   DG Abd 1 View  Result Date: 06/14/2023 CLINICAL DATA:  Nasogastric tube present. EXAM: ABDOMEN - 1 VIEW COMPARISON:  Abdominal radiograph 06/12/2023 FINDINGS: A feeding tube remains in place, terminating left of midline in the central abdomen at the level of the gastric body, similar to the prior study. Right upper quadrant abdominal surgical clips are noted. No dilated loops of bowel are seen in the included upper abdomen. The lungs are more fully evaluated on today's earlier chest radiograph. IMPRESSION: Unchanged positioning of the feeding tube with tip at the level of the gastric body. Electronically Signed   By: Sebastian Ache M.D.   On: 06/14/2023 12:05   DG CHEST PORT 1 VIEW  Result Date: 06/14/2023 CLINICAL DATA:  [id number] with acute respiratory failure with hypoxia. Ventilator dependent respiratory failure. EXAM: PORTABLE CHEST 1 VIEW COMPARISON:  Portable chest yesterday at 5:38 a.m. FINDINGS: 5:43 a.m. ETT tip is 2.1 cm from the carina. Feeding tube is well  inside the stomach but the tip is excluded by collimation.  The tube is extending towards the distal stomach. The heart is enlarged. Central vascular prominence and interstitial consolidation continue to be noted as well as patchy bilateral airspace disease. There is increased patchy opacity in both upper lobes, more so on the right, and no essential change in aeration in the lower zones with small pleural effusions unaltered. The mediastinum stable with aortic tortuosity and heavy calcification. No new osseous findings. Left IJ line again terminates in the distal SVC.  No pneumothorax. IMPRESSION: Increased patchy opacity in both upper lobes, more so on the right, consistent with pneumonia versus airspace edema. Stable vascular congestion and interstitial consolidation. Otherwise no significant change in the appearance of the chest. Support apparatus as above. Electronically Signed   By: Almira Bar M.D.   On: 06/14/2023 06:25   DG CHEST PORT 1 VIEW  Result Date: 06/13/2023 CLINICAL DATA:  Respiratory failure EXAM: PORTABLE CHEST 1 VIEW COMPARISON:  06/12/2023. FINDINGS: Prominent cardiac silhouette. Pulmonary interstitial edema. Left-sided moderate pleural effusion. No pneumothorax identified. IJ catheter in the left tip overlies mid SVC. Feeding tube tip below the diaphragm and off x-ray. Endotracheal tube 2 cm above carina. IMPRESSION: Findings suggest interval increase in interstitial edema. Electronically Signed   By: Layla Maw M.D.   On: 06/13/2023 09:45   ECHOCARDIOGRAM COMPLETE  Result Date: 06/12/2023    ECHOCARDIOGRAM REPORT   Patient Name:   Yolanda [name] Date of Exam: 06/12/2023 Medical Rec #:  [id number]    Height:       56.0 in Accession #:    [id number]   Weight:       123.9 lb Date of Birth:  09-22-1933    BSA:          1.448 m Patient Age:    89 years     BP:           130/59 mmHg Patient Gender: F            HR:           74 bpm. Exam Location:  Inpatient Procedure: 2D Echo, Cardiac  Doppler and Color Doppler Indications:    Congestive Heart Failure I50.9  History:        Patient has no prior history of Echocardiogram examinations.  Sonographer:    Aron Baba Referring Phys: [id number] Beulah Gandy PAYNE  Sonographer Comments: Echo performed with patient supine and on artificial respirator. Image acquisition challenging due to respiratory motion. IMPRESSIONS  1. Left ventricular ejection fraction, by estimation, is 70 to 75%. The left ventricle has hyperdynamic function. The left ventricle has no regional wall motion abnormalities. Left ventricular diastolic parameters are consistent with Grade I diastolic dysfunction (impaired relaxation).  2. Right ventricular systolic function is normal. The right ventricular size is normal. There is normal pulmonary artery systolic pressure. The estimated right ventricular systolic pressure is 24.5 mmHg.  3. The mitral valve is grossly normal. Trivial mitral valve regurgitation. No evidence of mitral stenosis.  4. The aortic valve is tricuspid. Aortic valve regurgitation is not visualized. No aortic stenosis is present.  5. The inferior vena cava is normal in size with greater than 50% respiratory variability, suggesting right atrial pressure of 3 mmHg. FINDINGS  Left Ventricle: Left ventricular ejection fraction, by estimation, is 70 to 75%. The left ventricle has hyperdynamic function. The left ventricle has no regional wall motion abnormalities. The left ventricular internal cavity size was normal in size. There is no left ventricular hypertrophy. Left ventricular diastolic parameters are consistent  with Grade I diastolic dysfunction (impaired relaxation). Right Ventricle: The right ventricular size is normal. No increase in right ventricular wall thickness. Right ventricular systolic function is normal. There is normal pulmonary artery systolic pressure. The tricuspid regurgitant velocity is 2.32 m/s, and  with an assumed right atrial pressure of 3 mmHg, the  estimated right ventricular systolic pressure is 24.5 mmHg. Left Atrium: Left atrial size was normal in size. Right Atrium: Right atrial size was normal in size. Pericardium: Trivial pericardial effusion is present. Presence of epicardial fat layer. Mitral Valve: The mitral valve is grossly normal. Trivial mitral valve regurgitation. No evidence of mitral valve stenosis. Tricuspid Valve: The tricuspid valve is grossly normal. Tricuspid valve regurgitation is not demonstrated. No evidence of tricuspid stenosis. Aortic Valve: The aortic valve is tricuspid. Aortic valve regurgitation is not visualized. No aortic stenosis is present. Pulmonic Valve: The pulmonic valve was grossly normal. Pulmonic valve regurgitation is not visualized. No evidence of pulmonic stenosis. Aorta: The aortic root and ascending aorta are structurally normal, with no evidence of dilitation. Venous: The inferior vena cava is normal in size with greater than 50% respiratory variability, suggesting right atrial pressure of 3 mmHg. IAS/Shunts: The atrial septum is grossly normal.  LEFT VENTRICLE PLAX 2D LVIDd:         3.70 cm   Diastology LVIDs:         2.30 cm   LV e' medial:    6.42 cm/s LV PW:         0.80 cm   LV E/e' medial:  12.4 LV IVS:        0.60 cm   LV e' lateral:   7.07 cm/s LVOT diam:     1.60 cm   LV E/e' lateral: 11.3 LV SV:         53 LV SV Index:   37 LVOT Area:     2.01 cm  RIGHT VENTRICLE RV S prime:     19.00 cm/s TAPSE (M-mode): 1.2 cm LEFT ATRIUM             Index        RIGHT ATRIUM           Index LA diam:        3.90 cm 2.69 cm/m   RA Area:     10.30 cm LA Vol (A2C):   31.9 ml 22.03 ml/m  RA Volume:   21.10 ml  14.57 ml/m LA Vol (A4C):   42.2 ml 29.14 ml/m LA Biplane Vol: 37.5 ml 25.89 ml/m  AORTIC VALVE LVOT Vmax:   158.00 cm/s LVOT Vmean:  97.000 cm/s LVOT VTI:    0.265 m  AORTA Ao Root diam: 3.30 cm Ao Asc diam:  2.90 cm MITRAL VALVE                TRICUSPID VALVE MV Area (PHT): 2.63 cm     TR Peak grad:   21.5  mmHg MV Decel Time: 288 msec     TR Vmax:        232.00 cm/s MR Peak grad: 7.1 mmHg MR Vmax:      133.50 cm/s   SHUNTS MV E velocity: 79.90 cm/s   Systemic VTI:  0.26 m MV A velocity: 119.00 cm/s  Systemic Diam: 1.60 cm MV E/A ratio:  0.67 Lennie Odor MD Electronically signed by Lennie Odor MD Signature Date/Time: 06/12/2023/3:03:58 PM    Final    DG Abd Portable 1V  Result Date: 06/12/2023 CLINICAL  DATA:  Feeding tube placement. EXAM: PORTABLE ABDOMEN - 1 VIEW COMPARISON:  Radiograph earlier today FINDINGS: Previous enteric tube has been removed. There is a new weighted enteric tube with tip in the midline, in the region of the gastric body. Nonobstructive upper abdominal bowel gas pattern. IMPRESSION: New weighted enteric tube with tip in the midline, in the region of the gastric body. Electronically Signed   By: Narda Rutherford M.D.   On: 06/12/2023 13:45   CT HEAD WO CONTRAST ( )  Result Date: 06/12/2023 CLINICAL DATA:  Subarachnoid hemorrhage, waiting mental status EXAM: CT HEAD WITHOUT CONTRAST TECHNIQUE: Contiguous axial images were obtained from the base of the skull through the vertex without intravenous contrast. RADIATION DOSE REDUCTION: This exam was performed according to the departmental dose-optimization program which includes automated exposure control, adjustment of the mA and/or kV according to patient size and/or use of iterative reconstruction technique. COMPARISON:  06/10/2023 CT head FINDINGS: Brain: Redemonstrated subarachnoid hemorrhage, which appears similar in distribution to the prior exam, although evaluation of the hemorrhage in the basilar cisterns is limited by beam hardening artifact from the left MCA coil pack. No definite acute infarct, mass, mass effect, or midline shift. Hemorrhage is noted layering in the dependent aspect of the ventricles. No hydrocephalus. Vascular: No hyperdense vessel. Status post interval coiling of a previously noted left MCA bifurcation  aneurysm. Beam hardening artifact from the coil pack limits evaluation of the aneurysm. Skull: Negative for fracture or focal lesion. Sinuses/Orbits: Left maxillary mucous retention cyst. Mucosal thickening in the ethmoid air cells and frontal sinuses. No acute finding in the orbits. Other: The mastoid air cells are well aerated. IMPRESSION: 1. Redemonstrated subarachnoid hemorrhage, which appears similar in distribution to the prior exam, although evaluation of the hemorrhage in the basilar cisterns is limited by beam hardening artifact from the left MCA coil pack. 2. Hemorrhage is noted layering in the dependent aspect of the ventricles. No hydrocephalus. 3. Status post interval coiling of a previously noted left MCA bifurcation aneurysm. Beam hardening artifact from the coil pack limits evaluation of the aneurysm. Electronically Signed   By: Wiliam Ke M.D.   On: 06/12/2023 02:09   DG Abd Portable 1V  Result Date: 06/12/2023 CLINICAL DATA:  OG tube placement EXAM: PORTABLE ABDOMEN - 1 VIEW COMPARISON:  None Available. FINDINGS: OG tube tip is in the mid to distal stomach which appears elongated. Prior cholecystectomy. No evidence of bowel obstruction. IMPRESSION: OG tube tip in the mid to distal stomach. Electronically Signed   By: Charlett Nose M.D.   On: 06/12/2023 00:41   DG CHEST PORT 1 VIEW  Result Date: 06/12/2023 CLINICAL DATA:  ET tube.  Encounter for OG tube placement. EXAM: PORTABLE CHEST 1 VIEW COMPARISON:  06/11/2023 FINDINGS: Endotracheal tube has been placed with the tip 3 cm from the carina. Left central line tip is in the SVC. OG tube is in the stomach. Heart and mediastinal contours within normal limits. Aortic atherosclerosis. Mild vascular congestion and bibasilar opacities. No definite effusions or pneumothorax. No acute bony abnormality. IMPRESSION: Support devices in expected position as above. Vascular congestion with bibasilar atelectasis or infiltrates. Electronically Signed    By: Charlett Nose M.D.   On: 06/12/2023 00:41   DG CHEST PORT 1 VIEW  Result Date: 06/11/2023 CLINICAL DATA:  Central line EXAM: PORTABLE CHEST 1 VIEW COMPARISON:  Chest x-ray 04/29/2022 FINDINGS: Left-sided central venous catheter is new with distal tip projecting over the brachiocephalic SVC junction. The cardiomediastinal silhouette  appears stable given projection. There are increased interstitial markings throughout both lungs. There is no pleural effusion. There is no pneumothorax. Osseous structures are stable. IMPRESSION: 1. New left-sided central venous catheter with distal tip projecting over the brachiocephalic SVC junction. No pneumothorax. 2. Increased interstitial markings throughout both lungs which may reflect pulmonary edema. Electronically Signed   By: Darliss Cheney M.D.   On: 06/11/2023 19:50   [place] EKG SITE RITE  Result Date: 06/11/2023 If Site Rite image not attached, placement could not be confirmed due to current cardiac rhythm.  IR NEURO EACH ADD'L AFTER BASIC UNI LEFT (MS)  Result Date: 06/11/2023 PROCEDURE: DIAGNOSTIC CEREBRAL ANGIOGRAM COIL EMBOLIZATION OF LEFT MIDDLE CEREBRAL ARTERY ANEURYSM HISTORY: The patient is a 89 year old woman presenting to the hospital with sudden onset of severe headache. Upon presentation she was also noted to have mild right facial droop and mild right-sided weakness which largely resolved. 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 She therefore presents for diagnostic cerebral angiogram and possible endovascular treatment of the ruptured left-sided aneurysms. ACCESS: The technical aspects of the procedure as well as its potential risks and benefits were reviewed with the patient's family via Designer, fashion/clothing. These risks included but were not limited to stroke, intracranial hemorrhage,  bleeding, infection, allergic reaction, damage to organs or vital structures, stroke, non-diagnostic procedure, and the catastrophic outcomes of heart attack, coma, and death. With an understanding of these risks, informed consent was obtained and witnessed. The patient was placed in the supine position on the angiography table and the skin of right groin prepped in the usual sterile fashion. The procedure was performed under general anesthesia. A 8-French sheath was introduced in the right common femoral artery using Seldinger technique. MEDICATIONS: HEPARIN: 0 Units total. CONTRAST:  cc, Omnipaque 300 FLUOROSCOPY TIME:  FLUOROSCOPY TIME: See IR records TECHNIQUE: CATHETERS AND WIRES 5-French JB-1 catheter 180 cm 0.035" glidewire 270cm stiff Glidewire 6-French NeuronMax guide sheath 6-French Berenstein Select JB-1 catheter 0.058" CatV guidecatheter Excelsior XT-27 microcatheter Synchro 2 select standard microwire Excelsior XT-17 microcatheter COILS USED Target 360 XL 12 mm x 45 cm Target 360 XL 10 mm x 40 cm VESSELS CATHETERIZED Left internal carotid Right common femoral Left middle cerebral artery VESSELS STUDIED Left internal carotid, head Left internal carotid, 3D rotational angiogram Left internal carotid artery, head (during embolization) Left internal carotid artery, head (immediate post-embolization) Left internal carotid artery, head (final control) Right common femoral PROCEDURAL NARRATIVE A 5-Fr JB-1 glide catheter was advanced over a 0.035 glidewire into the aortic arch. The left internal carotid artery was then catheterized and cervical / cerebral angiograms taken. This included 3D rotational angiogram. After review of images, it was clear that the aneurysms incorporated the origin both distal MCA branches. Under normal circumstances this aneurysms would not be amenable to primary coil embolization however given the patient's age, she is not a candidate for any other form of treatment including surgical  clip ligation, and aneurism and M2 morphology make attempt at stent supported coil embolization extremely difficult high-risk of aneurysms rupture. Subtotal coil embolization therefore appear to be the only viable treatment option to offer some protection from the hemorrhage. I therefore elected to proceed with attempted subtotal coil embolization. The stiff Glidewire was introduced into the proximal cervical left internal carotid artery under roadmap guidance. The JB 1 glide catheter was then exchanged for the  90 cm neuron max guide sheath. This was placed into the proximal left internal carotid artery. The 058 guide catheter was coaxially introduced over the 27 microcatheter and microwire. The microcatheter was then advanced under roadmap guidance into the left middle cerebral artery. The guide catheter was then tracked over the microcatheter to its final position in the proximal cavernous segment of the left internal carotid artery. The 27 microcatheter was then removed and the coiling catheter introduced over the microwire. The catheter was then navigated into the aneurysm lumen over the microwire. The wire was then removed. The above coils were then sequentially deployed with immediate and delayed angiogram taken after coil deployment in order to confirm patency of the more distal MCA branches. Cerebral angiography was performed immediately post embolization. The coiling catheter was then removed and the guide catheter withdrawn into the cervical internal carotid artery. Final control angiography was then performed from the guide catheter. The guide catheter and guide sheath were then synchronously removed without incident. FINDINGS: Left internal carotid, head: Injection reveals the presence of a widely patent ICA, A1, and M1 segments and their branches. There is a large aneurysm arising at the left middle cerebral artery bifurcation. The neck of the aneurysm and its association with the M2 segments is better  delineated on the three-dimensional rotational angiogram. The parenchymal and venous phases are normal. The venous sinuses are widely patent. Left internal carotid, 3D rotation 3-dimensional rotational angiographic images were reconstructed on an independent workstation for review. 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 Overall size of this aneurysm is approximately 13.5 mm antero posterior, 17.9 mm craniocaudal, and 14.3 mm medial-lateral. This aneurysms arises at the MCA bifurcation and incorporates both M2 segments at its neck. Left internal carotid artery, head (during embolization): Injection reveals the presence of a widely patent ICA that leads to a patent ACA and MCA. Coil mass within the aneurysm is stable, without coil prolapse or filling defect to suggest thrombus. Angiograms taken after deployment of the first and second coil both in immediate and delayed fashion reveal patency of the M2 segments. Left internal carotid artery, head (immediate post-embolization): Injection reveals the presence of a widely patent ICA that leads to a patent ACA and MCA. Coil mass within the aneurysm is stable. There is continued filling of the majority of the aneurysms however there is some contrast stasis within the dome of the aneurysms after coiling. The M2 segments remain patent. Capillary phase does not demonstrate any perfusion deficits. There are no branch occlusions noted. Venous sinuses remain patent. Left internal carotid artery, head (final control): Injection reveals the presence of a widely patent ICA that leads to a patent ACA and MCA. No thrombus is visualized. Coil mass is seen within the aneurysm and is in stable position. The parenchymal and venous phases are unremarkable. Right femoral: Normal vessel. No significant atherosclerotic disease. Arterial sheath in adequate position. DISPOSITION: Upon completion of the study, the femoral sheath was  removed and hemostasis obtained using a 8-Fr Angio-Seal closure device. Good proximal and distal lower extremity pulses were documented upon achievement of hemostasis. The procedure was well tolerated and no early complications were observed. The patient was transferred to the postanesthesia care unit in stable hemodynamic condition. IMPRESSION: 1. 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 The preliminary results of this procedure were shared with the patient's family. Electronically  Signed   By: Lisbeth Renshaw   On: 06/11/2023 07:01   IR Transcath/Emboliz  Result Date: 06/10/2023 PROCEDURE: DIAGNOSTIC CEREBRAL ANGIOGRAM COIL EMBOLIZATION OF LEFT MIDDLE CEREBRAL ARTERY ANEURYSM HISTORY: The patient is a 89 year old woman presenting to the hospital with sudden onset of severe headache. Upon presentation she was also noted to have mild right facial droop and mild right-sided weakness which largely resolved. 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 She therefore presents for diagnostic cerebral angiogram and possible endovascular treatment of the ruptured left-sided aneurysms. ACCESS: The technical aspects of the procedure as well as its potential risks and benefits were reviewed with the patient's family via Designer, fashion/clothing. These risks included but were not limited to stroke, intracranial hemorrhage, bleeding, infection, allergic reaction, damage to organs or vital structures, stroke, non-diagnostic procedure, and the catastrophic outcomes of heart attack, coma, and death. With an understanding of these risks, informed consent was obtained and witnessed. The patient was placed in the supine position on the  angiography table and the skin of right groin prepped in the usual sterile fashion. The procedure was performed under general anesthesia. A 8-French sheath was introduced in the right common femoral artery using Seldinger technique. MEDICATIONS: HEPARIN: 0 Units total. CONTRAST:  cc, Omnipaque 300 FLUOROSCOPY TIME:  FLUOROSCOPY TIME: See IR records TECHNIQUE: CATHETERS AND WIRES 5-French JB-1 catheter 180 cm 0.035" glidewire 270cm stiff Glidewire 6-French NeuronMax guide sheath 6-French Berenstein Select JB-1 catheter 0.058" CatV guidecatheter Excelsior XT-27 microcatheter Synchro 2 select standard microwire Excelsior XT-17 microcatheter COILS USED Target 360 XL 12 mm x 45 cm Target 360 XL 10 mm x 40 cm VESSELS CATHETERIZED Left internal carotid Right common femoral Left middle cerebral artery VESSELS STUDIED Left internal carotid, head Left internal carotid, 3D rotational angiogram Left internal carotid artery, head (during embolization) Left internal carotid artery, head (immediate post-embolization) Left internal carotid artery, head (final control) Right common femoral PROCEDURAL NARRATIVE A 5-Fr JB-1 glide catheter was advanced over a 0.035 glidewire into the aortic arch. The left internal carotid artery was then catheterized and cervical / cerebral angiograms taken. This included 3D rotational angiogram. After review of images, it was clear that the aneurysms incorporated the origin both distal MCA branches. Under normal circumstances this aneurysms would not be amenable to primary coil embolization however given the patient's age, she is not a candidate for any other form of treatment including surgical clip ligation, and aneurism and M2 morphology make attempt at stent supported coil embolization extremely difficult high-risk of aneurysms rupture. Subtotal coil embolization therefore appear to be the only viable treatment option to offer some protection from the hemorrhage. I therefore elected to proceed  with attempted subtotal coil embolization. The stiff Glidewire was introduced into the proximal cervical left internal carotid artery under roadmap guidance. The JB 1 glide catheter was then exchanged for the 90 cm neuron max guide sheath. This was placed into the proximal left internal carotid artery. The 058 guide catheter was coaxially introduced over the 27 microcatheter and microwire. The microcatheter was then advanced under roadmap guidance into the left middle cerebral artery. The guide catheter was then tracked over the microcatheter to its final position in the proximal cavernous segment of the left internal carotid artery. The 27 microcatheter was then removed and the coiling catheter introduced over the microwire. The catheter was then navigated into the aneurysm  lumen over the microwire. The wire was then removed. The above coils were then sequentially deployed with immediate and delayed angiogram taken after coil deployment in order to confirm patency of the more distal MCA branches. Cerebral angiography was performed immediately post embolization. The coiling catheter was then removed and the guide catheter withdrawn into the cervical internal carotid artery. Final control angiography was then performed from the guide catheter. The guide catheter and guide sheath were then synchronously removed without incident. FINDINGS: Left internal carotid, head: Injection reveals the presence of a widely patent ICA, A1, and M1 segments and their branches. There is a large aneurysm arising at the left middle cerebral artery bifurcation. The neck of the aneurysm and its association with the M2 segments is better delineated on the three-dimensional rotational angiogram. The parenchymal and venous phases are normal. The venous sinuses are widely patent. Left internal carotid, 3D rotation 3-dimensional rotational angiographic images were reconstructed on an independent workstation for review. 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 Overall size of this aneurysm is approximately 13.5 mm antero posterior, 17.9 mm craniocaudal, and 14.3 mm medial-lateral. This aneurysms arises at the MCA bifurcation and incorporates both M2 segments at its neck. Left internal carotid artery, head (during embolization): Injection reveals the presence of a widely patent ICA that leads to a patent ACA and MCA. Coil mass within the aneurysm is stable, without coil prolapse or filling defect to suggest thrombus. Angiograms taken after deployment of the first and second coil both in immediate and delayed fashion reveal patency of the M2 segments. Left internal carotid artery, head (immediate post-embolization): Injection reveals the presence of a widely patent ICA that leads to a patent ACA and MCA. Coil mass within the aneurysm is stable. There is continued filling of the majority of the aneurysms however there is some contrast stasis within the dome of the aneurysms after coiling. The M2 segments remain patent. Capillary phase does not demonstrate any perfusion deficits. There are no branch occlusions noted. Venous sinuses remain patent. Left internal carotid artery, head (final control): Injection reveals the presence of a widely patent ICA that leads to a patent ACA and MCA. No thrombus is visualized. Coil mass is seen within the aneurysm and is in stable position. The parenchymal and venous phases are unremarkable. Right femoral: Normal vessel. No significant atherosclerotic disease. Arterial sheath in adequate position. DISPOSITION: Upon completion of the study, the femoral sheath was removed and hemostasis obtained using a 8-Fr Angio-Seal closure device. Good proximal and distal lower extremity pulses were documented upon achievement of hemostasis. The procedure was well tolerated and no early complications were observed. The patient was transferred to the postanesthesia care unit in  stable hemodynamic condition. IMPRESSION: 1. 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 The preliminary results of this procedure were shared with the patient's family. Electronically Signed   By: Lisbeth Renshaw   On: 06/10/2023 19:29   IR 3D Independent Annabell Sabal  Result Date: 06/10/2023 PROCEDURE: DIAGNOSTIC CEREBRAL ANGIOGRAM COIL EMBOLIZATION OF LEFT MIDDLE CEREBRAL ARTERY ANEURYSM HISTORY: The patient is a 89 year old woman presenting to the hospital with sudden onset of severe headache. Upon presentation she was also noted to have mild right facial droop and mild right-sided weakness which largely resolved. Her CT scan demonstrated diffuse basal subarachnoid hemorrhage, slightly eccentric to the left with CT angiogram revealing a large  left middle cerebral artery aneurysms as the likely source of hemorrhage. She therefore presents for diagnostic cerebral angiogram and possible endovascular treatment of the ruptured left-sided aneurysms. ACCESS: The technical aspects of the procedure as well as its potential risks and benefits were reviewed with the patient's family via [employer], fashion/clothing. These risks included but were not limited to stroke, intracranial hemorrhage, bleeding, infection, allergic reaction, damage to organs or vital structures, stroke, non-diagnostic procedure, and the catastrophic outcomes of heart attack, coma, and death. With an understanding of these risks, informed consent was obtained and witnessed. The patient was placed in the supine position on the angiography table and the skin of right groin prepped in the usual sterile fashion. The procedure was performed under general anesthesia. A 8-French sheath was introduced in the right common femoral artery using Seldinger technique. MEDICATIONS: HEPARIN: 0 Units total. CONTRAST:  cc, Omnipaque 300 FLUOROSCOPY  TIME:  FLUOROSCOPY TIME: See IR records TECHNIQUE: CATHETERS AND WIRES 5-French JB-1 catheter 180 cm 0.035" glidewire 270cm stiff Glidewire 6-French NeuronMax guide sheath 6-French Berenstein Select JB-1 catheter 0.058" CatV guidecatheter Excelsior XT-27 microcatheter Synchro 2 select standard microwire Excelsior XT-17 microcatheter COILS USED Target 360 XL 12 mm x 45 cm Target 360 XL 10 mm x 40 cm VESSELS CATHETERIZED Left internal carotid Right common femoral Left middle cerebral artery VESSELS STUDIED Left internal carotid, head Left internal carotid, 3D rotational angiogram Left internal carotid artery, head (during embolization) Left internal carotid artery, head (immediate post-embolization) Left internal carotid artery, head (final control) Right common femoral PROCEDURAL NARRATIVE A 5-Fr JB-1 glide catheter was advanced over a 0.035 glidewire into the aortic arch. The left internal carotid artery was then catheterized and cervical / cerebral angiograms taken. This included 3D rotational angiogram. After review of images, it was clear that the aneurysms incorporated the origin both distal MCA branches. Under normal circumstances this aneurysms would not be amenable to primary coil embolization however given the patient's age, she is not a candidate for any other form of treatment including surgical clip ligation, and aneurism and M2 morphology make attempt at stent supported coil embolization extremely difficult high-risk of aneurysms rupture. Subtotal coil embolization therefore appear to be the only viable treatment option to offer some protection from the hemorrhage. I therefore elected to proceed with attempted subtotal coil embolization. The stiff Glidewire was introduced into the proximal cervical left internal carotid artery under roadmap guidance. The JB 1 glide catheter was then exchanged for the 90 cm neuron max guide sheath. This was placed into the proximal left internal carotid artery. The 058  guide catheter was coaxially introduced over the 27 microcatheter and microwire. The microcatheter was then advanced under roadmap guidance into the left middle cerebral artery. The guide catheter was then tracked over the microcatheter to its final position in the proximal cavernous segment of the left internal carotid artery. The 27 microcatheter was then removed and the coiling catheter introduced over the microwire. The catheter was then navigated into the aneurysm lumen over the microwire. The wire was then removed. The above coils were then sequentially deployed with immediate and delayed angiogram taken after coil deployment in order to confirm patency of the more distal MCA branches. Cerebral angiography was performed immediately post embolization. The coiling catheter was then removed and the guide catheter withdrawn into the cervical internal carotid artery. Final control angiography was then performed from the guide catheter. The guide catheter and guide sheath were then synchronously removed without incident. FINDINGS: Left internal carotid, head:  Injection reveals the presence of a widely patent ICA, A1, and M1 segments and their branches. There is a large aneurysm arising at the left middle cerebral artery bifurcation. The neck of the aneurysm and its association with the M2 segments is better delineated on the three-dimensional rotational angiogram. The parenchymal and venous phases are normal. The venous sinuses are widely patent. Left internal carotid, 3D rotation 3-dimensional rotational angiographic images were reconstructed on an independent workstation for review. 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 Overall size of this aneurysm is approximately 13.5 mm antero posterior, 17.9 mm craniocaudal, and 14.3 mm medial-lateral. This aneurysms arises at the MCA bifurcation and incorporates both M2 segments at its neck. Left internal carotid  artery, head (during embolization): Injection reveals the presence of a widely patent ICA that leads to a patent ACA and MCA. Coil mass within the aneurysm is stable, without coil prolapse or filling defect to suggest thrombus. Angiograms taken after deployment of the first and second coil both in immediate and delayed fashion reveal patency of the M2 segments. Left internal carotid artery, head (immediate post-embolization): Injection reveals the presence of a widely patent ICA that leads to a patent ACA and MCA. Coil mass within the aneurysm is stable. There is continued filling of the majority of the aneurysms however there is some contrast stasis within the dome of the aneurysms after coiling. The M2 segments remain patent. Capillary phase does not demonstrate any perfusion deficits. There are no branch occlusions noted. Venous sinuses remain patent. Left internal carotid artery, head (final control): Injection reveals the presence of a widely patent ICA that leads to a patent ACA and MCA. No thrombus is visualized. Coil mass is seen within the aneurysm and is in stable position. The parenchymal and venous phases are unremarkable. Right femoral: Normal vessel. No significant atherosclerotic disease. Arterial sheath in adequate position. DISPOSITION: Upon completion of the study, the femoral sheath was removed and hemostasis obtained using a 8-Fr Angio-Seal closure device. Good proximal and distal lower extremity pulses were documented upon achievement of hemostasis. The procedure was well tolerated and no early complications were observed. The patient was transferred to the postanesthesia care unit in stable hemodynamic condition. IMPRESSION: 1. 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 The preliminary results of this procedure were shared with the patient's family. Electronically  Signed   By: Lisbeth Renshaw   On: 06/10/2023 19:29   IR Angiogram Follow Up Study  Result Date: 06/10/2023 PROCEDURE: DIAGNOSTIC CEREBRAL ANGIOGRAM COIL EMBOLIZATION OF LEFT MIDDLE CEREBRAL ARTERY ANEURYSM HISTORY: The patient is a 89 year old woman presenting to the hospital with sudden onset of severe headache. Upon presentation she was also noted to have mild right facial droop and mild right-sided weakness which largely resolved. 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 She therefore presents for diagnostic cerebral angiogram and possible endovascular treatment of the ruptured left-sided aneurysms. ACCESS: The technical aspects of the procedure as well as its potential risks and benefits were reviewed with the patient's family via Designer, fashion/clothing. These risks included but were not limited to stroke, intracranial hemorrhage, bleeding, infection, allergic reaction, damage to organs or vital structures, stroke, non-diagnostic procedure, and the catastrophic outcomes of heart attack, coma, and death. With  an understanding of these risks, informed consent was obtained and witnessed. The patient was placed in the supine position on the angiography table and the skin of right groin prepped in the usual sterile fashion. The procedure was performed under general anesthesia. A 8-French sheath was introduced in the right common femoral artery using Seldinger technique. MEDICATIONS: HEPARIN: 0 Units total. CONTRAST:  cc, Omnipaque 300 FLUOROSCOPY TIME:  FLUOROSCOPY TIME: See IR records TECHNIQUE: CATHETERS AND WIRES 5-French JB-1 catheter 180 cm 0.035" glidewire 270cm stiff Glidewire 6-French NeuronMax guide sheath 6-French Berenstein Select JB-1 catheter 0.058" CatV guidecatheter Excelsior XT-27 microcatheter Synchro 2 select standard microwire  Excelsior XT-17 microcatheter COILS USED Target 360 XL 12 mm x 45 cm Target 360 XL 10 mm x 40 cm VESSELS CATHETERIZED Left internal carotid Right common femoral Left middle cerebral artery VESSELS STUDIED Left internal carotid, head Left internal carotid, 3D rotational angiogram Left internal carotid artery, head (during embolization) Left internal carotid artery, head (immediate post-embolization) Left internal carotid artery, head (final control) Right common femoral PROCEDURAL NARRATIVE A 5-Fr JB-1 glide catheter was advanced over a 0.035 glidewire into the aortic arch. The left internal carotid artery was then catheterized and cervical / cerebral angiograms taken. This included 3D rotational angiogram. After review of images, it was clear that the aneurysms incorporated the origin both distal MCA branches. Under normal circumstances this aneurysms would not be amenable to primary coil embolization however given the patient's age, she is not a candidate for any other form of treatment including surgical clip ligation, and aneurism and M2 morphology make attempt at stent supported coil embolization extremely difficult high-risk of aneurysms rupture. Subtotal coil embolization therefore appear to be the only viable treatment option to offer some protection from the hemorrhage. I therefore elected to proceed with attempted subtotal coil embolization. The stiff Glidewire was introduced into the proximal cervical left internal carotid artery under roadmap guidance. The JB 1 glide catheter was then exchanged for the 90 cm neuron max guide sheath. This was placed into the proximal left internal carotid artery. The 058 guide catheter was coaxially introduced over the 27 microcatheter and microwire. The microcatheter was then advanced under roadmap guidance into the left middle cerebral artery. The guide catheter was then tracked over the microcatheter to its final position in the proximal cavernous segment of the left  internal carotid artery. The 27 microcatheter was then removed and the coiling catheter introduced over the microwire. The catheter was then navigated into the aneurysm lumen over the microwire. The wire was then removed. The above coils were then sequentially deployed with immediate and delayed angiogram taken after coil deployment in order to confirm patency of the more distal MCA branches. Cerebral angiography was performed immediately post embolization. The coiling catheter was then removed and the guide catheter withdrawn into the cervical internal carotid artery. Final control angiography was then performed from the guide catheter. The guide catheter and guide sheath were then synchronously removed without incident. FINDINGS: Left internal carotid, head: Injection reveals the presence of a widely patent ICA, A1, and M1 segments and their branches. There is a large aneurysm arising at the left middle cerebral artery bifurcation. The neck of the aneurysm and its association with the M2 segments is better delineated on the three-dimensional rotational angiogram. The parenchymal and venous phases are normal. The venous sinuses are widely patent. Left internal carotid, 3D rotation 3-dimensional rotational angiographic images were reconstructed on an independent workstation for review. These further delineate the above-described large left  middle cerebral artery bifurcation aneurysms. Overall size of this aneurysm is approximately 13.5 mm antero posterior, 17.9 mm craniocaudal, and 14.3 mm medial-lateral. This aneurysms arises at the MCA bifurcation and incorporates both M2 segments at its neck. Left internal carotid artery, head (during embolization): Injection reveals the presence of a widely patent ICA that leads to a patent ACA and MCA. Coil mass within the aneurysm is stable, without coil prolapse or filling defect to suggest thrombus. Angiograms taken after deployment of the first and second coil both in  immediate and delayed fashion reveal patency of the M2 segments. Left internal carotid artery, head (immediate post-embolization): Injection reveals the presence of a widely patent ICA that leads to a patent ACA and MCA. Coil mass within the aneurysm is stable. There is continued filling of the majority of the aneurysms however there is some contrast stasis within the dome of the aneurysms after coiling. The M2 segments remain patent. Capillary phase does not demonstrate any perfusion deficits. There are no branch occlusions noted. Venous sinuses remain patent. Left internal carotid artery, head (final control): Injection reveals the presence of a widely patent ICA that leads to a patent ACA and MCA. No thrombus is visualized. Coil mass is seen within the aneurysm and is in stable position. The parenchymal and venous phases are unremarkable. Right femoral: Normal vessel. No significant atherosclerotic disease. Arterial sheath in adequate position. DISPOSITION: Upon completion of the study, the femoral sheath was removed and hemostasis obtained using a 8-Fr Angio-Seal closure device. Good proximal and distal lower extremity pulses were documented upon achievement of hemostasis. The procedure was well tolerated and no early complications were observed. The patient was transferred to the postanesthesia care unit in stable hemodynamic condition. IMPRESSION: 1. 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 The preliminary results of this procedure were shared with the patient's family. Electronically Signed   By: Lisbeth Renshaw   On: 06/10/2023 19:29   IR Angiogram Follow Up Study  Result Date: 06/10/2023 PROCEDURE: DIAGNOSTIC CEREBRAL ANGIOGRAM COIL EMBOLIZATION OF LEFT MIDDLE CEREBRAL ARTERY ANEURYSM HISTORY: The patient is a 89 year old woman presenting to the hospital with sudden  onset of severe headache. Upon presentation she was also noted to have mild right facial droop and mild right-sided weakness which largely resolved. 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 She therefore presents for diagnostic cerebral angiogram and possible endovascular treatment of the ruptured left-sided aneurysms. ACCESS: The technical aspects of the procedure as well as its potential risks and benefits were reviewed with the patient's family via Designer, fashion/clothing. These risks included but were not limited to stroke, intracranial hemorrhage, bleeding, infection, allergic reaction, damage to organs or vital structures, stroke, non-diagnostic procedure, and the catastrophic outcomes of heart attack, coma, and death. With an understanding of these risks, informed consent was obtained and witnessed. The patient was placed in the supine position on the angiography table and the skin of right groin prepped in the usual sterile fashion. The procedure was performed under general anesthesia. A 8-French sheath was introduced in the right common femoral artery using Seldinger technique. MEDICATIONS: HEPARIN: 0 Units total. CONTRAST:  cc, Omnipaque 300 FLUOROSCOPY TIME:  FLUOROSCOPY TIME: See IR records TECHNIQUE: CATHETERS AND WIRES 5-French JB-1 catheter 180 cm 0.035" glidewire 270cm  stiff Glidewire 6-French NeuronMax guide sheath 6-French Berenstein Select JB-1 catheter 0.058" CatV guidecatheter Excelsior XT-27 microcatheter Synchro 2 select standard microwire Excelsior XT-17 microcatheter COILS USED Target 360 XL 12 mm x 45 cm Target 360 XL 10 mm x 40 cm VESSELS CATHETERIZED Left internal carotid Right common femoral Left middle cerebral artery VESSELS STUDIED Left internal carotid, head Left internal carotid, 3D rotational angiogram Left internal carotid  artery, head (during embolization) Left internal carotid artery, head (immediate post-embolization) Left internal carotid artery, head (final control) Right common femoral PROCEDURAL NARRATIVE A 5-Fr JB-1 glide catheter was advanced over a 0.035 glidewire into the aortic arch. The left internal carotid artery was then catheterized and cervical / cerebral angiograms taken. This included 3D rotational angiogram. After review of images, it was clear that the aneurysms incorporated the origin both distal MCA branches. Under normal circumstances this aneurysms would not be amenable to primary coil embolization however given the patient's age, she is not a candidate for any other form of treatment including surgical clip ligation, and aneurism and M2 morphology make attempt at stent supported coil embolization extremely difficult high-risk of aneurysms rupture. Subtotal coil embolization therefore appear to be the only viable treatment option to offer some protection from the hemorrhage. I therefore elected to proceed with attempted subtotal coil embolization. The stiff Glidewire was introduced into the proximal cervical left internal carotid artery under roadmap guidance. The JB 1 glide catheter was then exchanged for the 90 cm neuron max guide sheath. This was placed into the proximal left internal carotid artery. The 058 guide catheter was coaxially introduced over the 27 microcatheter and microwire. The microcatheter was then advanced under roadmap guidance into the left middle cerebral artery. The guide catheter was then tracked over the microcatheter to its final position in the proximal cavernous segment of the left internal carotid artery. The 27 microcatheter was then removed and the coiling catheter introduced over the microwire. The catheter was then navigated into the aneurysm lumen over the microwire. The wire was then removed. The above coils were then sequentially deployed with immediate and delayed  angiogram taken after coil deployment in order to confirm patency of the more distal MCA branches. Cerebral angiography was performed immediately post embolization. The coiling catheter was then removed and the guide catheter withdrawn into the cervical internal carotid artery. Final control angiography was then performed from the guide catheter. The guide catheter and guide sheath were then synchronously removed without incident. FINDINGS: Left internal carotid, head: Injection reveals the presence of a widely patent ICA, A1, and M1 segments and their branches. There is a large aneurysm arising at the left middle cerebral artery bifurcation. The neck of the aneurysm and its association with the M2 segments is better delineated on the three-dimensional rotational angiogram. The parenchymal and venous phases are normal. The venous sinuses are widely patent. Left internal carotid, 3D rotation 3-dimensional rotational angiographic images were reconstructed on an independent workstation for review. 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 Overall size of this aneurysm is approximately 13.5 mm antero posterior, 17.9 mm craniocaudal, and 14.3 mm medial-lateral. This aneurysms arises at the MCA bifurcation and incorporates both M2 segments at its neck. Left internal carotid artery, head (during embolization): Injection reveals the presence of a widely patent ICA that leads to a patent ACA and MCA. Coil mass within the aneurysm is stable, without coil prolapse or filling defect to suggest thrombus. Angiograms taken after deployment of the first and  second coil both in immediate and delayed fashion reveal patency of the M2 segments. Left internal carotid artery, head (immediate post-embolization): Injection reveals the presence of a widely patent ICA that leads to a patent ACA and MCA. Coil mass within the aneurysm is stable. There is continued filling of the  majority of the aneurysms however there is some contrast stasis within the dome of the aneurysms after coiling. The M2 segments remain patent. Capillary phase does not demonstrate any perfusion deficits. There are no branch occlusions noted. Venous sinuses remain patent. Left internal carotid artery, head (final control): Injection reveals the presence of a widely patent ICA that leads to a patent ACA and MCA. No thrombus is visualized. Coil mass is seen within the aneurysm and is in stable position. The parenchymal and venous phases are unremarkable. Right femoral: Normal vessel. No significant atherosclerotic disease. Arterial sheath in adequate position. DISPOSITION: Upon completion of the study, the femoral sheath was removed and hemostasis obtained using a 8-Fr Angio-Seal closure device. Good proximal and distal lower extremity pulses were documented upon achievement of hemostasis. The procedure was well tolerated and no early complications were observed. The patient was transferred to the postanesthesia care unit in stable hemodynamic condition. IMPRESSION: 1. 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 The preliminary results of this procedure were shared with the patient's family. Electronically Signed   By: Lisbeth Renshaw   On: 06/10/2023 19:29   CT ANGIO HEAD NECK W WO CM (CODE STROKE)  Result Date: 06/10/2023 CLINICAL DATA:  Neuro deficit with acute stroke suspected EXAM: CT ANGIOGRAPHY HEAD AND NECK WITH AND WITHOUT CONTRAST TECHNIQUE: Multidetector CT imaging of the head and neck was performed using the standard protocol during bolus administration of intravenous contrast. Multiplanar CT image reconstructions and MIPs were obtained to evaluate the vascular anatomy. Carotid stenosis measurements (when applicable) are obtained utilizing NASCET criteria, using the  distal internal carotid diameter as the denominator. RADIATION DOSE REDUCTION: This exam was performed according to the departmental dose-optimization program which includes automated exposure control, adjustment of the mA and/or kV according to patient size and/or use of iterative reconstruction technique. CONTRAST:  75mL OMNIPAQUE IOHEXOL 350 MG/ML SOLN COMPARISON:  Head CT from earlier today FINDINGS: CTA NECK FINDINGS Aortic arch: Atheromatous plaque with 3 vessel branching. Right carotid system: Atheromatous calcification at the bifurcation without stenosis or ulceration. Left carotid system: Atheromatous calcification at the bifurcation without stenosis or ulceration Vertebral arteries: Proximal subclavian atherosclerosis. The vertebrals are widely patent and smoothly contoured. Skeleton: No acute finding Other neck: No acute or worrisome finding for age. Upper chest: Negative Review of the MIP images confirms the above findings CTA HEAD FINDINGS Anterior circulation: Cerebral aneurysm: *19 mm left MCA bifurcation aneurysm which incorporates the parent vessels. The sac is fairly smooth. *8 mm right MCA bifurcation aneurysm incorporating M2 origins, with mild sac lobulation and calcification. *5 mm right PCOM origin aneurysm, elongated with fairly smooth sac. No flow reducing stenosis or generalized beading. Posterior circulation: Small vertebral and basilar arteries in the setting of fetal type left PCA. There is atheromatous irregularity bilaterally. Some turbulent flow in the left MCA aneurysm with diminished enhancement of left MCA branches compared to the right. Venous sinuses: As permitted by contrast timing, patent. Anatomic variants: As above Review of the MIP images confirms the above findings IMPRESSION: Three cerebral aneurysms - 19 mm of the left  MCA bifurcation, 8 mm at the right MCA bifurcation, and 5 mm at the right PCOM origin. The source of subarachnoid hemorrhage is not certain based on the  pattern of blood or sac irregularity. Electronically Signed   By: Tiburcio Pea M.D.   On: 06/10/2023 07:20   CT HEAD CODE STROKE WO CONTRAST  Result Date: 06/10/2023 CLINICAL DATA:  Code stroke.  Awoke with terrible headache. EXAM: CT HEAD WITHOUT CONTRAST TECHNIQUE: Contiguous axial images were obtained from the base of the skull through the vertex without intravenous contrast. RADIATION DOSE REDUCTION: This exam was performed according to the departmental dose-optimization program which includes automated exposure control, adjustment of the mA and/or kV according to patient size and/or use of iterative reconstruction technique. COMPARISON:  03/20/2021 FINDINGS: Brain: Large volume of subarachnoid hemorrhage centered at the basal cisterns and inter hemispheric/sylvian fissures. No hydrocephalus or visible infarct. Vascular: Left MCA aneurysm as isodense defect central to hemorrhage along the lower left sylvian fissure. Skull: Negative for acute finding Sinuses/Orbits: Negative Other: Critical Value/emergent results were called by telephone at the time of interpretation on 06/10/2023 at 6:52 am to provider Dr Derry Lory, who verbally acknowledged these results. IMPRESSION: Large volume aneurysmal pattern subarachnoid hemorrhage which is diffuse. Known large left MCA bifurcation aneurysm. No hydrocephalus. Electronically Signed   By: Tiburcio Pea M.D.   On: 06/10/2023 06:53    Microbiology: Results for orders placed or performed during the hospital encounter of 06/10/23  MRSA Next Gen by PCR, Nasal     Status: None   Collection Time: 06/10/23 11:43 PM   Specimen: Nasal Mucosa; Nasal Swab  Result Value Ref Range Status   MRSA by PCR Next Gen NOT DETECTED NOT DETECTED Final    Comment: (NOTE) The GeneXpert MRSA Assay (FDA approved for NASAL specimens only), is one component of a comprehensive MRSA colonization surveillance program. It is not intended to diagnose MRSA infection nor to guide or  monitor treatment for MRSA infections. Test performance is not FDA approved in patients less than 62 years old. Performed at [facility], [address], [place], [place] [address]   Culture, Respiratory w Gram Stain     Status: None   Collection Time: 06/12/23  9:46 AM   Specimen: Tracheal Aspirate; Respiratory  Result Value Ref Range Status   Specimen Description TRACHEAL ASPIRATE  Final   Special Requests NONE  Final   Gram Stain NO WBC SEEN NO ORGANISMS SEEN   Final   Culture   Final    RARE Normal respiratory flora-no Staph aureus or Pseudomonas seen Performed at [facility], [address] [place], [place] [address]    Report Status 06/14/2023 FINAL  Final    Labs: CBC: Recent Labs  Lab 06/21/23 0455 06/22/23 0423 06/23/23 0429 06/24/23 0434 06/25/23 0734  WBC 30.3* 23.8* 19.0* 15.4* 12.6*  HGB 11.9* 12.2 10.8* 10.6* 10.1*  HCT 36.4 38.0 33.9* 33.5* 31.9*  MCV 87.5 90.9 89.2 89.8 91.9  PLT 263 254 251 282 300   Basic Metabolic Panel: Recent Labs  Lab 06/21/23 0455 06/22/23 0423 06/23/23 0429 06/25/23 0734 06/26/23 1412  NA 144 144 138 137 135  K 4.0 3.4* 3.9 3.7 3.7  CL 106 105 106 103 101  CO2 24 24 21* 23 26  GLUCOSE 160* 168* 167* 152* 143*  BUN 46* 39* 35* 19 13  CREATININE 1.03* 0.95 1.08* 0.63 0.60  CALCIUM 9.5 9.4 8.6* 8.1* 8.0*  MG  --   --  2.1 1.9  --   PHOS  --   --   --  3.1  --    Liver Function Tests: No results for input(s): "AST", "ALT", "ALKPHOS", "BILITOT", "PROT", "ALBUMIN" in the last 168 hours. CBG: Recent Labs  Lab 06/26/23 1501 06/26/23 1951 06/26/23 2327 06/27/23 0337 06/27/23 0750  GLUCAP 133* 100* 108* 113* 115*    Discharge time spent: greater than 30 minutes.  Signed: Alba Cory, MD [facility] 06/27/2023

## 2023-06-27 NOTE — Progress Notes (Signed)
Inpatient Rehabilitation Admission Medication Review by a Pharmacist  A complete drug regimen review was completed for this patient to identify any potential clinically significant medication issues.  High Risk Drug Classes Is patient taking? Indication by Medication  Antipsychotic No   Anticoagulant Yes Heparin- vte ppx  Antibiotic No   Opioid No   Antiplatelet No   Hypoglycemics/insulin No   Vasoactive Medication Yes Zebeta, Nimotop, Flomax- HTN, urinary hesitancy  Chemotherapy No   Other Yes Zoloft- MDD Trazodone- sleep Rozerem- sleep     Type of Medication Issue Identified Description of Issue Recommendation(s)  Drug Interaction(s) (clinically significant)     Duplicate Therapy     Allergy     No Medication Administration End Date     Incorrect Dose     Additional Drug Therapy Needed     Significant med changes from prior encounter (inform family/care partners about these prior to discharge).    Other       Clinically significant medication issues were identified that warrant physician communication and completion of prescribed/recommended actions by midnight of the next day:  No    Time spent performing this drug regimen review (minutes):  30   Jandy Brackens BS, PharmD, BCPS Clinical Pharmacist 06/27/2023 2:50 PM  Contact: [phone] after 3 PM  "Be curious, not judgmental..." -Debbora Dus

## 2023-06-27 NOTE — Progress Notes (Signed)
Pt and all belongings transferred to [facility] per order. Report given to [facility]. All questions answered. Family at bedside.

## 2023-06-27 NOTE — Progress Notes (Signed)
PMR Admission Coordinator Pre-Admission Assessment   Patient: Yolanda [name] is an 89 y.o., female MRN: [id number] DOB: 08-10-1933 Height: 4\' 8"  (142.2 cm) Weight: 56.9 kg   Insurance Information HMO:     PPO:      PCP:      IPA:      80/20:      OTHER:  PRIMARY: Medicare A and B      Policy#: [id number]      Subscriber: patient CM Name:        Phone#:       Fax#:   Pre-Cert#:        Employer: Retired, not employed Benefits:  Phone #:      Name: Checked on passport one source [employer]. Date: A=07/22/22 and B=06/22/10     Deduct: $1632      Out of Pocket Max: none      Life Max: N/A CIR: 100%      SNF: 100 days Outpatient: 80%     Co-Pay: 20% Home Health: 100%      Co-Pay: none DME: 80%     Co-Pay: 20% Providers: patient's choice  SECONDARY: Medicaid [place]      Policy#: [id number]     Phone#: [phone]   Financial Counselor:       Phone#:    The "Data Collection Information Summary" for patients in Inpatient Rehabilitation Facilities with attached "Privacy Act Statement-Health Care Records" was provided and verbally reviewed with: Family   Emergency Contact Information Contact Information       Name Relation Home Work [name] [phone]   [phone]    [name], [name]     [phone]         Other Contacts   None on File        Current Medical History  Patient Admitting Diagnosis: L SAH   History of Present Illness: An 89 year old with past medical history significant for asthma, dementia, essential hypertension presented to the ED with sudden onset of severe headache, nausea vomiting.  Associated with right-sided weakness, photophobia and facial droop.  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  Neurosurgery consulted and PCCM asked to admit patient to the ICU.  Patient underwent coiling of the left MCA aneurysm by Dr. Conchita Paris.  Patient also required  intubation. .  She developed  stridor after extubation which was treated with racemic epinephrine and Decadron.  Respiratory status improved.  She was transferred to progressive care floor. Patient has been able to tolerate diet. Core track tube removed. PT/OT/SLP evaluations completed with recommendations for acute inpatient rehab admission.    Complete NIHSS TOTAL: 15   Patient's medical record from [facility] has been reviewed by the rehabilitation admission coordinator and physician.   Past Medical History      Past Medical History:  Diagnosis Date   Closed fracture of left proximal humerus 03/27/2021   Closed fracture of lower end of left radius with routine healing 10/31/2020   Dementia (HCC)     Mild intermittent asthma without complication 04/04/2019   OSTEOARTHRITIS 08/04/2010    Annotation: Bilateral knees and ankles Qualifier: Diagnosis of  By: Delrae Alfred MD, Lanora Manis            Has the patient had major surgery during 100 days prior to admission? Yes   Family History   family history is not on file.   Current Medications  Current Medications  Current Facility-Administered Medications:    0.9 %  sodium chloride infusion, 250 mL, Intravenous, Continuous, Sood, Vineet, MD   0.9 %  sodium chloride infusion, , Intravenous, Continuous, Sood, Vineet, MD, Last Rate: 10 mL/hr at 06/27/23 0737, Infusion Verify at 06/27/23 0737   acetaminophen (TYLENOL) tablet 650 mg, 650 mg, Oral, Q6H PRN, 650 mg at 06/26/23 1700 **OR** acetaminophen (TYLENOL) suppository 650 mg, 650 mg, Rectal, Q6H PRN, Coralyn Helling, MD   acetaminophen (TYLENOL) tablet 500 mg, 500 mg, Oral, TID, Regalado, Belkys A, MD, 500 mg at 06/27/23 1102   albuterol (PROVENTIL) (2.5 MG/3ML) 0.083% nebulizer solution 2.5 mg, 2.5 mg, Nebulization, Q4H PRN, Craige Cotta, Vineet, MD, 2.5 mg at 06/12/23 0235   arformoterol (BROVANA) nebulizer solution 15 mcg, 15 mcg, Nebulization, BID, Craige Cotta, Vineet, MD, 15 mcg at 06/27/23 0835   bisoprolol (ZEBETA) tablet 5  mg, 5 mg, Oral, Daily, Sood, Vineet, MD, 5 mg at 06/27/23 0838   budesonide (PULMICORT) nebulizer solution 0.5 mg, 0.5 mg, Nebulization, BID, Craige Cotta, Vineet, MD, 0.5 mg at 06/27/23 0835   Chlorhexidine Gluconate Cloth 2 % PADS 6 each, 6 each, Topical, Daily, Sood, Vineet, MD, 6 each at 06/26/23 1108   diphenhydrAMINE (BENADRYL) 12.5 MG/5ML elixir 12.5 mg, 12.5 mg, Oral, Q8H PRN, Calton Dach I, RPH, 12.5 mg at 06/25/23 2131   feeding supplement (ENSURE ENLIVE / ENSURE PLUS) liquid 237 mL, 237 mL, Oral, BID BM, Osvaldo Shipper, MD, 237 mL at 06/27/23 0841   heparin injection 5,000 Units, 5,000 Units, Subcutaneous, Q8H, Sood, Vineet, MD, 5,000 Units at 06/27/23 0553   insulin aspart (novoLOG) injection 0-6 Units, 0-6 Units, Subcutaneous, Q4H, Sood, Vineet, MD, 1 Units at 06/26/23 0846   labetalol (NORMODYNE) injection 5 mg, 5 mg, Intravenous, Q4H PRN, Coralyn Helling, MD, 5 mg at 06/18/23 1722   niMODipine (NYMALIZE) 6 MG/ML oral solution 60 mg, 60 mg, Oral, Q4H, 60 mg at 06/27/23 0838 **OR** niMODipine (NIMOTOP) capsule 60 mg, 60 mg, Oral, Q4H, Regalado, Belkys A, MD, 30 mg at 06/26/23 2145   Oral care mouth rinse, 15 mL, Mouth Rinse, 4 times per day, Coralyn Helling, MD, 15 mL at 06/27/23 1109   Oral care mouth rinse, 15 mL, Mouth Rinse, PRN, Coralyn Helling, MD   oxyCODONE (Oxy IR/ROXICODONE) immediate release tablet 5 mg, 5 mg, Oral, Q6H PRN, Coralyn Helling, MD, 5 mg at 06/27/23 0841   ramelteon (ROZEREM) tablet 8 mg, 8 mg, Oral, QHS, Sood, Vineet, MD, 8 mg at 06/26/23 2145   saccharomyces boulardii (FLORASTOR) capsule 250 mg, 250 mg, Oral, BID, Regalado, Belkys A, MD, 250 mg at 06/27/23 0843   sertraline (ZOLOFT) tablet 25 mg, 25 mg, Oral, Daily, Sood, Vineet, MD, 25 mg at 06/27/23 0838   tamsulosin (FLOMAX) capsule 0.4 mg, 0.4 mg, Oral, QPC supper, Osvaldo Shipper, MD, 0.4 mg at 06/26/23 1801   traZODone (DESYREL) tablet 100 mg, 100 mg, Oral, QHS, Osvaldo Shipper, MD, 100 mg at 06/26/23 2145      Patients Current Diet:  Diet Order                  Diet - low sodium heart healthy             DIET DYS 3 Room service appropriate? Yes with Assist; Fluid consistency: Thin  Diet effective now                         Precautions / Restrictions Precautions Precautions: Fall Precaution Comments: SBP goal <150  Restrictions Weight Bearing Restrictions: No    Has the patient had 2 or more falls or a fall with injury in the past year? Yes   Prior Activity Level Limited Community (1-2x/wk): Went out 3-4 times a week   Prior Functional Level Self Care: Did the patient need help bathing, dressing, using the toilet or eating? Needed some help   Indoor Mobility: Did the patient need assistance with walking from room to room (with or without device)? Needed some help   Stairs: Did the patient need assistance with internal or external stairs (with or without device)? Needed some help   Functional Cognition: Did the patient need help planning regular tasks such as shopping or remembering to take medications? Needed some help   Patient Information Are you of Hispanic, Latino/a,or Spanish origin?: A. No, not of Hispanic, Latino/a, or Spanish origin What is your race?: D. Asian [place] Do you need or want an interpreter to communicate with a doctor or health care staff?: 1. Yes (Speaks Nepali)   Patient's Response To:  Health Literacy and Transportation Is the patient able to respond to health literacy and transportation needs?: Yes Health Literacy - How often do you need to have someone help you when you read instructions, pamphlets, or other written material from your doctor or pharmacy?: Always In the past 12 months, has lack of transportation kept you from medical appointments or from getting medications?: No In the past 12 months, has lack of transportation kept you from meetings, work, or from getting things needed for daily living?: No   Home Assistive Devices /  Equipment Home Equipment: Cane - single point, Shower seat   Prior Device Use: Indicate devices/aids used by the patient prior to current illness, exacerbation or injury?  Cane   Current Functional Level Cognition   Arousal/Alertness: Awake/alert Overall Cognitive Status: Impaired/Different from baseline Current Attention Level: Focused Orientation Level: Oriented to person Following Commands: Follows one step commands with increased time, Follows one step commands inconsistently Safety/Judgement: Decreased awareness of safety, Decreased awareness of deficits General Comments: Pt knew she was at a hospital and reports she is here due to a headache, but did not seem to understand her diagnosis. Follows simple commands with extra time ~70% of the time. No awareness of bowel incontinence and having her feet up in it. Poor safety awareness, sitting prematurely during gait training. Attention: Focused Focused Attention: Impaired Focused Attention Impairment: Verbal basic (per family, repetition of information was necessary at home prior to admission) Memory: Impaired Memory Impairment: Retrieval deficit Awareness: Impaired Awareness Impairment: Intellectual impairment Problem Solving: Impaired Problem Solving Impairment: Verbal basic, Functional basic Behaviors: Restless Safety/Judgment: Impaired (bilateral mittins in place)    Extremity Assessment (includes Sensation/Coordination)   Upper Extremity Assessment: RUE deficits/detail, LUE deficits/detail RUE Deficits / Details: moving UE and able to raise above head to command, squeezing hand 3/5 MMT (tends to move L arm first, requring cueing ofr R side) RUE Sensation:  (unable to determine) RUE Coordination: decreased fine motor, decreased gross motor LUE Deficits / Details: moving UE and raising above head when cued, MMT grasp 3/5  Lower Extremity Assessment: RLE deficits/detail RLE Deficits / Details: Likely poor  sensation/proprioception as pt would often get her R leg stuck under her L and seemed unaware of it, but difficult to formally assess while she is intubated and with impaired cognition; activation of quads noted to resist therapist PROM at times, but overall weak compared to L     ADLs  Overall ADL's : Needs assistance/impaired Eating/Feeding: Minimal assistance, Sitting Grooming: Minimal assistance, Sitting, Wash/dry face Grooming Details (indicate cue type and reason): washing face with R hand, would need assist for bimanual tasks Upper Body Dressing : Maximal assistance, Bed level Upper Body Dressing Details (indicate cue type and reason): to change gown Lower Body Dressing: Total assistance, +2 for physical assistance, +2 for safety/equipment, Sit to/from stand Lower Body Dressing Details (indicate cue type and reason): attempting to don socks with multiple attempts, initially donning socks on hands Toilet Transfer: +2 for physical assistance, +2 for safety/equipment, Ambulation, Total assistance Toilet Transfer Details (indicate cue type and reason): using stedy, but min assist +2 to stand via steady and RW Toileting- Clothing Manipulation and Hygiene: Total assistance, Bed level Toileting - Clothing Manipulation Details (indicate cue type and reason): incontient bowel, assist for pad change bed level Functional mobility during ADLs: Minimal assistance, +2 for physical assistance, +2 for safety/equipment General ADL Comments: Patient continues to require significant assist for all aspects of care     Mobility   Overal bed mobility: Needs Assistance Bed Mobility: Rolling, Sidelying to Sit Rolling: Used rails, Min assist Sidelying to sit: Min assist, Used rails Supine to sit: Mod assist, +2 for physical assistance, +2 for safety/equipment, HOB elevated, Used rails Sit to supine: Max assist, +2 for physical assistance, +2 for safety/equipment Sit to sidelying: Min assist General bed  mobility comments: Pt using rails and legs to roll self L <> R for pericare, minA to direct pt. Verbal and tactile cues provided to try to facilitate pt pushing up on the bed to ascend trunk and sit up, but pt repeatedly bringing legs back up on the bed and needed maxA to initiate trunk ascension before she actively engaged and needed modAx2 to complete the transition to sitting. Pt able to scoot anteriorly to edge with minA-CGA     Transfers   Overall transfer level: Needs assistance Equipment used: Ambulation equipment used, Rolling walker (2 wheels) Transfers: Sit to/from Stand, Bed to chair/wheelchair/BSC Sit to Stand: +2 safety/equipment, +2 physical assistance, Min assist Bed to/from chair/wheelchair/BSC transfer type:: Via Lift equipment Step pivot transfers: Mod assist, +2 physical assistance Transfer via Lift Equipment: Stedy General transfer comment: Pt needed minAx2 to come to stand from EOB to stedy 1x, from stedy flaps 1x, and from recliner to RW 1x. Verbal and tactile cues provided for hand placement and to shift weight anteriorly to stand each rep     Ambulation / Gait / Stairs / Wheelchair Mobility   Ambulation/Gait Ambulation/Gait assistance: Mod assist, Min assist, +2 safety/equipment, +2 physical assistance Gait Distance (Feet): 4 Feet Assistive device: Rolling walker (2 wheels) Gait Pattern/deviations: Step-to pattern, Decreased step length - left, Decreased step length - right, Decreased stride length, Decreased dorsiflexion - right, Decreased dorsiflexion - left, Decreased weight shift to right, Decreased weight shift to left, Knee flexed in stance - left, Knee flexed in stance - right, Shuffle, Trunk flexed General Gait Details: Pt ambulates with short, shuffling, unsteady steps with bil knees slightly flexed. Pt needed tactile cues to shift her weight and advance/clear her foot to step. MinAx2 for balance stepping anteriorly. Pt impulsive to sit prematurely and needing  modAx2 to safely direct her to the chair. Gait velocity: reduced Gait velocity interpretation: <1.31 ft/sec, indicative of household ambulator     Posture / Balance Dynamic Sitting Balance Sitting balance - Comments: CGA to sit statically EOB and to lift bil legs into bil knee extension while sitting  EOB Balance Overall balance assessment: Needs assistance Sitting-balance support: No upper extremity supported, Feet supported Sitting balance-Leahy Scale: Fair Sitting balance - Comments: CGA to sit statically EOB and to lift bil legs into bil knee extension while sitting EOB Postural control: Right lateral lean Standing balance support: Bilateral upper extremity supported, During functional activity Standing balance-Leahy Scale: Poor Standing balance comment: R lateral lean as she fatigued, min-modAx2     Special needs/care consideration Continuous Drip IV  KVO with IV antibiotic and Behavioral consideration Has h/o dementia, is confused.    Previous Home Environment (from acute therapy documentation) Living Arrangements: Children (son and daughter and grandkids) Available Help at Discharge: Family, Available 24 hours/day Type of Home: House Home Layout: Two level, Able to live on main level with bedroom/bathroom Home Access: Level entry Bathroom Shower/Tub: Tub/shower unit, Health visitor: Standard Additional Comments: granddaughter providing info and not fully sure on info   Discharge Living Setting Plans for Discharge Living Setting: House, Lives with (comment) (Lives with son and DIL and their 73 and 75 yo children) Type of Home at Discharge: House Discharge Home Layout: Multi-level, Other (Comment) (Split level with patient's bedroom and 1/2 bath lower level) Alternate Level Stairs-Number of Steps: 5 steps to downstairs and 7 to upstairs level Discharge Home Access: Level entry (Level entry garage level to patient's area.) Discharge Bathroom Shower/Tub: Tub/shower  unit, Curtain Forensic scientist is on upper level) Discharge Bathroom Toilet: Standard Discharge Bathroom Accessibility: Yes How Accessible: Accessible via walker Does the patient have any problems obtaining your medications?: No   Social/Family/Support Systems Patient Roles: Parent, Other (Comment) (Has son, dtr, DIL, grand children.) Contact Information: Ilynn Sowder - son - [phone] Anticipated Caregiver: DIL, caregiver and family Anticipated Caregiver's Contact Information: [place] Bells - DIL - [phone] Caregiver Availability: 24/7 Discharge Plan Discussed with Primary Caregiver: Yes Is Caregiver In Agreement with Plan?: Yes Does Caregiver/Family have Issues with Lodging/Transportation while Pt is in Rehab?: No   Goals Patient/Family Goal for Rehab: PT/OT/SLP supervision goals Expected length of stay: 10-12 days Cultural Considerations: From [place], close to [place], 13 years in the [place]. Pt/Family Agrees to Admission and willing to participate: Yes Program Orientation Provided & Reviewed with Pt/Caregiver Including Roles  & Responsibilities: Yes   Decrease burden of Care through IP rehab admission: N/A   Possible need for SNF placement upon discharge: No   Patient Condition: I have reviewed medical records from [facility], spoken with CM, and son, daughter, and family member. I met with patient at the bedside and discussed via phone for inpatient rehabilitation assessment.  Patient will benefit from ongoing PT, OT, and SLP, can actively participate in 3 hours of therapy a day 5 days of the week, and can make measurable gains during the admission.  Patient will also benefit from the coordinated team approach during an Inpatient Acute Rehabilitation admission.  The patient will receive intensive therapy as well as Rehabilitation physician, nursing, social worker, and care management interventions.  Due to bladder management, bowel management, safety, skin/wound care, disease  management, medication administration, pain management, and patient education the patient requires 24 hour a day rehabilitation nursing.  The patient is currently min to mod assist with mobility and min to max assist with basic ADLs.  Discharge setting and therapy post discharge at home with home health is anticipated.  Patient has agreed to participate in the Acute Inpatient Rehabilitation Program and will admit today.   Preadmission Screen Completed By:  Trish Mage, 06/27/2023  11:58 AM ______________________________________________________________________   Discussed status with Dr. Riley Kill on 06/27/23 at 0945 and received approval for admission today.   Admission Coordinator:  Trish Mage, RN, time 1218/Date 06/27/23    Assessment/Plan: Diagnosis:Subarachnoid hemorrhage/aneurysmal rupture  Does the need for close, 24 hr/day Medical supervision in concert with the patient's rehab needs make it unreasonable for this patient to be served in a less intensive setting? Yes Co-Morbidities requiring supervision/potential complications: Delirium, acute on chronic diastolic CHF, prolonged QT, history of essential hypertension, acute pulmonary edema, aspiration pneumonia, dysphagia, diarrhea, hypokalemia, asthma, steroid-induced hyperglycemia, anemia of critical illness, leukocytosis, urinary retention Due to bladder management, bowel management, safety, skin/wound care, disease management, medication administration, pain management, and patient education, does the patient require 24 hr/day rehab nursing? Yes Does the patient require coordinated care of a physician, rehab nurse, PT, OT, and SLP to address physical and functional deficits in the context of the above medical diagnosis(es)? Yes Addressing deficits in the following areas: balance, endurance, locomotion, strength, transferring, bowel/bladder control, bathing, dressing, feeding, grooming, toileting, cognition, speech, language, swallowing, and  psychosocial support Can the patient actively participate in an intensive therapy program of at least 3 hrs of therapy 5 days a week? Yes The potential for patient to make measurable gains while on inpatient rehab is excellent Anticipated functional outcomes upon discharge from inpatient rehab: supervision PT, supervision OT, supervision SLP Estimated rehab length of stay to reach the above functional goals is: 10-12 Anticipated discharge destination: Home 10. Overall Rehab/Functional Prognosis: excellent     MD Signature: Fanny Dance          Revision History

## 2023-06-27 NOTE — Progress Notes (Signed)
Foley removed prior to transfer to CIR. No spontaneous void. In and out cath performed. Orders placed.

## 2023-06-27 NOTE — TOC Transition Note (Signed)
Transition of Care (TOC) - CM/SW Discharge Note Donn Pierini RN,BSN Transitions of Care Unit 4NP (Non Trauma)- RN Case Manager See Treatment Team for direct Phone #   Patient Details  Name: Yolanda [name] MRN: [id number] Date of Birth: 1932/11/20  Transition of Care [facility]) CM/SW Contact:  Darrold Span, RN Phone Number: 06/27/2023, 1:48 PM   Clinical Narrative:    Cm notified by CIR liaison that pt has been re-assessed and is appropriate for INPT rehab as well as pt has bed available for INPT rehab admission today. Family agreeable to INPT rehab admit prior to pt returning home.   MD has confirmed that pt is medically stable to transition to [facility] INPT rehab. Plan will be to transition later this afternoon to INPT rehabe bed. Family remains at the bedside.   No further TOC needs at this time.    Final next level of care: IP Rehab Facility Barriers to Discharge: Barriers Resolved   Patient Goals and CMS Choice CMS Medicare.gov Compare Post Acute Care list provided to:: Patient Represenative (must comment) Choice offered to / list presented to : Adult Children (son)  Discharge Placement                         Discharge Plan and Services Additional resources added to the After Visit Summary for   In-house Referral: Clinical Social Work Discharge Planning Services: CM Consult Post Acute Care Choice: Home Health                    HH Arranged: PT, OT, Speech Therapy, Nurse's Aide          Social Determinants of Health (SDOH) Interventions SDOH Screenings   Food Insecurity: No Food Insecurity (06/19/2023)  Housing: High Risk (06/19/2023)  Transportation Needs: Patient Unable To Answer (06/19/2023)  Utilities: Patient Unable To Answer (06/19/2023)  Tobacco Use: Unknown (06/10/2023)     Readmission Risk Interventions    06/26/2023   11:55 AM  Readmission Risk Prevention Plan  Transportation Screening Complete  Home Care Screening Complete  Medication  Review (RN CM) Complete

## 2023-06-27 NOTE — Progress Notes (Signed)
Physical Therapy Treatment Patient Details Name: Yolanda [name] MRN: [id number] DOB: November 25, 1932 Today's Date: 06/27/2023   History of Present Illness Pt is an 89 y.o. female who presented 06/10/23 with acute onset of severe headache and R-sided weakness. CT scan demonstrates evidence of a diffuse subarachnoid hemorrhage more prominent in her left sylvian fissure. 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 S/p coil embolization of L MCA aneurysm 8/19. Intubated 8/20. Cortrak placed 8/21. PMH: dementia, asthma, OA, HTN    PT Comments  Pt is requiring less assistance for all functional mobility, now only needing CGA for bed mobility and modA to transfer and step along EOB with the RW. Further progression limited by pt getting ready to d/c to [facility] today.     If plan is discharge home, recommend the following: Two people to help with walking and/or transfers;Two people to help with bathing/dressing/bathroom;Assistance with cooking/housework;Direct supervision/assist for medications management;Direct supervision/assist for financial management;Assistance with feeding;Assist for transportation;Help with stairs or ramp for entrance;Supervision due to cognitive status   Can travel by private vehicle     No  Equipment Recommendations  Other (comment) (defer to next venue)    Recommendations for Other Services Rehab consult     Precautions / Restrictions Precautions Precautions: Fall Precaution Comments: SBP goal <150 Restrictions Weight Bearing Restrictions: No     Mobility  Bed Mobility Overal bed mobility: Needs Assistance Bed Mobility: Supine to Sit, Sit to Supine     Supine to sit: Contact guard, HOB elevated Sit to supine: Contact guard assist, HOB elevated   General bed mobility comments: CGA for safety, pt coming to long sit and needed cues to pivot hips to EOB to sit up    Transfers Overall  transfer level: Needs assistance Equipment used: Rolling walker (2 wheels) Transfers: Sit to/from Stand Sit to Stand: Mod assist           General transfer comment: ModA to power up to stand and gain balance from EOB to RW    Ambulation/Gait Ambulation/Gait assistance: Mod assist Gait Distance (Feet): 2 Feet Assistive device: Rolling walker (2 wheels) Gait Pattern/deviations: Step-to pattern, Decreased step length - left, Decreased step length - right, Decreased stride length, Decreased dorsiflexion - right, Decreased dorsiflexion - left, Decreased weight shift to right, Decreased weight shift to left, Knee flexed in stance - left, Knee flexed in stance - right, Shuffle, Trunk flexed Gait velocity: reduced Gait velocity interpretation: <1.31 ft/sec, indicative of household ambulator   General Gait Details: Pt ambulates with short, shuffling, unsteady steps with bil knees slightly flexed. Pt needed tactile cues to shift her weight and advance/clear her foot to step, assisting her L foot to advance to L. ModA for balance and cuing to step to L along EOB a couple times. Distance limited further by pt being ready to be d/c'd from acute services.   Stairs             Wheelchair Mobility     Tilt Bed    Modified Rankin (Stroke Patients Only) Modified Rankin (Stroke Patients Only) Pre-Morbid Rankin Score: Slight disability Modified Rankin: Moderately severe disability     Balance Overall balance assessment: Needs assistance Sitting-balance support: No upper extremity supported, Feet supported Sitting balance-[name] Scale: Fair Sitting balance - Comments: CGA to sit statically EOB   Standing balance support: Bilateral upper extremity supported, During functional activity Standing balance-[name] Scale: Poor Standing balance comment: ModA for balance  with RW                            Cognition Arousal: Alert Behavior During Therapy: Flat affect Overall  Cognitive Status: Impaired/Different from baseline Area of Impairment: Attention, Following commands, Safety/judgement, Awareness, Problem solving, Memory                   Current Attention Level: Focused Memory: Decreased short-term memory Following Commands: Follows one step commands with increased time, Follows one step commands inconsistently Safety/Judgement: Decreased awareness of safety, Decreased awareness of deficits Awareness: Intellectual Problem Solving: Slow processing, Difficulty sequencing, Requires verbal cues, Requires tactile cues, Decreased initiation General Comments: Follows simple commands with extra time ~80% of the time. Poor awareness        Exercises      General Comments General comments (skin integrity, edema, etc.): video interpreter [name] [phone] used throughout; encouraged pt and family to have pt continue to perform AROM of extremities      Pertinent Vitals/Pain Pain Assessment Pain Assessment: No/denies pain    Home Living                          Prior Function            PT Goals (current goals can now be found in the care plan section) Acute Rehab PT Goals Patient Stated Goal: to improve PT Goal Formulation: With patient/family Time For Goal Achievement: 06/27/23 Potential to Achieve Goals: Fair Progress towards PT goals: Progressing toward goals    Frequency    Min 1X/week      PT Plan      Co-evaluation              AM-PAC PT "6 Clicks" Mobility   Outcome Measure  Help needed turning from your back to your side while in a flat bed without using bedrails?: A Little Help needed moving from lying on your back to sitting on the side of a flat bed without using bedrails?: A Little Help needed moving to and from a bed to a chair (including a wheelchair)?: A Lot Help needed standing up from a chair using your arms (e.g., wheelchair or bedside chair)?: A Lot Help needed to walk in hospital room?:  Total Help needed climbing 3-5 steps with a railing? : Total 6 Click Score: 12    End of Session Equipment Utilized During Treatment: Gait belt Activity Tolerance: Patient tolerated treatment well Patient left: with call bell/phone within reach;with family/visitor present;in bed;with nursing/sitter in room (getting ready to d/c with RN) Nurse Communication: Mobility status PT Visit Diagnosis: Muscle weakness (generalized) (M62.81);Difficulty in walking, not elsewhere classified (R26.2);Other symptoms and signs involving the nervous system (R29.898);Unsteadiness on feet (R26.81);Other abnormalities of gait and mobility (R26.89)     Time: 1191-4782 PT Time Calculation (min) (ACUTE ONLY): 13 min  Charges:    $Therapeutic Activity: 8-22 mins PT General Charges $$ ACUTE PT VISIT: 1 Visit                     Virgil Benedict, PT, DPT Acute Rehabilitation Services  Office: [phone]    Bettina Gavia 06/27/2023, 6:17 PM

## 2023-06-27 NOTE — Progress Notes (Addendum)
Pt arrived by bed escorted by discharging RN from [facility];pt accompained by daughter. Patient is non english speaking;speaks [place], patient oriented to unit via face time with son. Vitals WDL, patient denies pain. All questions answered. Family at bedside.

## 2023-06-27 NOTE — PMR Pre-admission (Signed)
PMR Admission Coordinator Pre-Admission Assessment  Patient: Yolanda [name] is an 89 y.o., female MRN: [id number] DOB: 1932-11-14 Height: 4\' 8"  (142.2 cm) Weight: 56.9 kg  Insurance Information HMO:     PPO:      PCP:      IPA:      80/20:      OTHER:  PRIMARY: Medicare A and B      Policy#: [id number]      Subscriber: patient CM Name:        Phone#:       Fax#:   Pre-Cert#:        Employer: Retired, not employed Benefits:  Phone #:      Name: Checked on passport one source [employer]. Date: A=07/22/22 and B=06/22/10     Deduct: $1632      Out of Pocket Max: none      Life Max: N/A CIR: 100%      SNF: 100 days Outpatient: 80%     Co-Pay: 20% Home Health: 100%      Co-Pay: none DME: 80%     Co-Pay: 20% Providers: patient's choice  SECONDARY: Medicaid [place]      Policy#: [id number]     Phone#: [phone]  Financial Counselor:       Phone#:   The "Data Collection Information Summary" for patients in Inpatient Rehabilitation Facilities with attached "Privacy Act Statement-Health Care Records" was provided and verbally reviewed with: Family  Emergency Contact Information Contact Information     Name Relation Home Work [place] Son [phone]  [phone]   Ikisha, Eppel   [phone]      Other Contacts   None on File     Current Medical History  Patient Admitting Diagnosis: L SAH  History of Present Illness: An 89 year old with past medical history significant for asthma, dementia, essential hypertension presented to the ED with sudden onset of severe headache, nausea vomiting.  Associated with right-sided weakness, photophobia and facial droop.  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  Neurosurgery consulted and PCCM asked to admit patient to the ICU.  Patient underwent coiling of the left MCA aneurysm by Dr. Conchita Paris.  Patient also required  intubation. .  She developed stridor after  extubation which was treated with racemic epinephrine and Decadron.  Respiratory status improved.  She was transferred to progressive care floor. Patient has been able to tolerate diet. Core track tube removed. PT/OT/SLP evaluations completed with recommendations for acute inpatient rehab admission.   Complete NIHSS TOTAL: 15  Patient's medical record from [facility] has been reviewed by the rehabilitation admission coordinator and physician.  Past Medical History  Past Medical History:  Diagnosis Date   Closed fracture of left proximal humerus 03/27/2021   Closed fracture of lower end of left radius with routine healing 10/31/2020   Dementia (HCC)    Mild intermittent asthma without complication 04/04/2019   OSTEOARTHRITIS 08/04/2010   Annotation: Bilateral knees and ankles Qualifier: Diagnosis of  By: Delrae Alfred MD, Lanora Manis      Has the patient had major surgery during 100 days prior to admission? Yes  Family History   family history is not on file.  Current Medications  Current Facility-Administered Medications:    0.9 %  sodium chloride infusion, 250 mL, Intravenous, Continuous, Sood, Vineet, MD   0.9 %  sodium chloride infusion, , Intravenous, Continuous, Sood, Vineet, MD, Last Rate: 10 mL/hr at 06/27/23  7829, Infusion Verify at 06/27/23 0737   acetaminophen (TYLENOL) tablet 650 mg, 650 mg, Oral, Q6H PRN, 650 mg at 06/26/23 1700 **OR** acetaminophen (TYLENOL) suppository 650 mg, 650 mg, Rectal, Q6H PRN, Coralyn Helling, MD   acetaminophen (TYLENOL) tablet 500 mg, 500 mg, Oral, TID, Regalado, Belkys A, MD, 500 mg at 06/27/23 1102   albuterol (PROVENTIL) (2.5 MG/3ML) 0.083% nebulizer solution 2.5 mg, 2.5 mg, Nebulization, Q4H PRN, Craige Cotta, Vineet, MD, 2.5 mg at 06/12/23 0235   arformoterol (BROVANA) nebulizer solution 15 mcg, 15 mcg, Nebulization, BID, Craige Cotta, Vineet, MD, 15 mcg at 06/27/23 0835   bisoprolol (ZEBETA) tablet 5 mg, 5 mg, Oral, Daily, Sood, Vineet, MD, 5 mg at 06/27/23  0838   budesonide (PULMICORT) nebulizer solution 0.5 mg, 0.5 mg, Nebulization, BID, Craige Cotta, Vineet, MD, 0.5 mg at 06/27/23 0835   Chlorhexidine Gluconate Cloth 2 % PADS 6 each, 6 each, Topical, Daily, Sood, Vineet, MD, 6 each at 06/26/23 1108   diphenhydrAMINE (BENADRYL) 12.5 MG/5ML elixir 12.5 mg, 12.5 mg, Oral, Q8H PRN, Calton Dach I, RPH, 12.5 mg at 06/25/23 2131   feeding supplement (ENSURE ENLIVE / ENSURE PLUS) liquid 237 mL, 237 mL, Oral, BID BM, Osvaldo Shipper, MD, 237 mL at 06/27/23 0841   heparin injection 5,000 Units, 5,000 Units, Subcutaneous, Q8H, Sood, Vineet, MD, 5,000 Units at 06/27/23 0553   insulin aspart (novoLOG) injection 0-6 Units, 0-6 Units, Subcutaneous, Q4H, Sood, Vineet, MD, 1 Units at 06/26/23 0846   labetalol (NORMODYNE) injection 5 mg, 5 mg, Intravenous, Q4H PRN, Coralyn Helling, MD, 5 mg at 06/18/23 1722   niMODipine (NYMALIZE) 6 MG/ML oral solution 60 mg, 60 mg, Oral, Q4H, 60 mg at 06/27/23 0838 **OR** niMODipine (NIMOTOP) capsule 60 mg, 60 mg, Oral, Q4H, Regalado, Belkys A, MD, 30 mg at 06/26/23 2145   Oral care mouth rinse, 15 mL, Mouth Rinse, 4 times per day, Coralyn Helling, MD, 15 mL at 06/27/23 1109   Oral care mouth rinse, 15 mL, Mouth Rinse, PRN, Coralyn Helling, MD   oxyCODONE (Oxy IR/ROXICODONE) immediate release tablet 5 mg, 5 mg, Oral, Q6H PRN, Coralyn Helling, MD, 5 mg at 06/27/23 0841   ramelteon (ROZEREM) tablet 8 mg, 8 mg, Oral, QHS, Sood, Vineet, MD, 8 mg at 06/26/23 2145   saccharomyces boulardii (FLORASTOR) capsule 250 mg, 250 mg, Oral, BID, Regalado, Belkys A, MD, 250 mg at 06/27/23 0843   sertraline (ZOLOFT) tablet 25 mg, 25 mg, Oral, Daily, Sood, Vineet, MD, 25 mg at 06/27/23 0838   tamsulosin (FLOMAX) capsule 0.4 mg, 0.4 mg, Oral, QPC supper, Osvaldo Shipper, MD, 0.4 mg at 06/26/23 1801   traZODone (DESYREL) tablet 100 mg, 100 mg, Oral, QHS, Osvaldo Shipper, MD, 100 mg at 06/26/23 2145  Patients Current Diet:  Diet Order             Diet - low  sodium heart healthy           DIET DYS 3 Room service appropriate? Yes with Assist; Fluid consistency: Thin  Diet effective now                   Precautions / Restrictions Precautions Precautions: Fall Precaution Comments: SBP goal <150 Restrictions Weight Bearing Restrictions: No   Has the patient had 2 or more falls or a fall with injury in the past year? Yes  Prior Activity Level Limited Community (1-2x/wk): Went out 3-4 times a week  Prior Functional Level Self Care: Did the patient need help bathing, dressing, using the toilet or  eating? Needed some help  Indoor Mobility: Did the patient need assistance with walking from room to room (with or without device)? Needed some help  Stairs: Did the patient need assistance with internal or external stairs (with or without device)? Needed some help  Functional Cognition: Did the patient need help planning regular tasks such as shopping or remembering to take medications? Needed some help  Patient Information Are you of Hispanic, Latino/a,or Spanish origin?: A. No, not of Hispanic, Latino/a, or Spanish origin What is your race?: D. Asian [place] Do you need or want an interpreter to communicate with a doctor or health care staff?: 1. Yes (Speaks Nepali)  Patient's Response To:  Health Literacy and Transportation Is the patient able to respond to health literacy and transportation needs?: Yes Health Literacy - How often do you need to have someone help you when you read instructions, pamphlets, or other written material from your doctor or pharmacy?: Always In the past 12 months, has lack of transportation kept you from medical appointments or from getting medications?: No In the past 12 months, has lack of transportation kept you from meetings, work, or from getting things needed for daily living?: No  Home Assistive Devices / Equipment Home Equipment: Cane - single point, Shower seat  Prior Device Use: Indicate  devices/aids used by the patient prior to current illness, exacerbation or injury?  Cane  Current Functional Level Cognition  Arousal/Alertness: Awake/alert Overall Cognitive Status: Impaired/Different from baseline Current Attention Level: Focused Orientation Level: Oriented to person Following Commands: Follows one step commands with increased time, Follows one step commands inconsistently Safety/Judgement: Decreased awareness of safety, Decreased awareness of deficits General Comments: Pt knew she was at a hospital and reports she is here due to a headache, but did not seem to understand her diagnosis. Follows simple commands with extra time ~70% of the time. No awareness of bowel incontinence and having her feet up in it. Poor safety awareness, sitting prematurely during gait training. Attention: Focused Focused Attention: Impaired Focused Attention Impairment: Verbal basic (per family, repetition of information was necessary at home prior to admission) Memory: Impaired Memory Impairment: Retrieval deficit Awareness: Impaired Awareness Impairment: Intellectual impairment Problem Solving: Impaired Problem Solving Impairment: Verbal basic, Functional basic Behaviors: Restless Safety/Judgment: Impaired (bilateral mittins in place)    Extremity Assessment (includes Sensation/Coordination)  Upper Extremity Assessment: RUE deficits/detail, LUE deficits/detail RUE Deficits / Details: moving UE and able to raise above head to command, squeezing hand 3/5 MMT (tends to move L arm first, requring cueing ofr R side) RUE Sensation:  (unable to determine) RUE Coordination: decreased fine motor, decreased gross motor LUE Deficits / Details: moving UE and raising above head when cued, MMT grasp 3/5  Lower Extremity Assessment: RLE deficits/detail RLE Deficits / Details: Likely poor sensation/proprioception as pt would often get her R leg stuck under her L and seemed unaware of it, but difficult to  formally assess while she is intubated and with impaired cognition; activation of quads noted to resist therapist PROM at times, but overall weak compared to L    ADLs  Overall ADL's : Needs assistance/impaired Eating/Feeding: Minimal assistance, Sitting Grooming: Minimal assistance, Sitting, Wash/dry face Grooming Details (indicate cue type and reason): washing face with R hand, would need assist for bimanual tasks Upper Body Dressing : Maximal assistance, Bed level Upper Body Dressing Details (indicate cue type and reason): to change gown Lower Body Dressing: Total assistance, +2 for physical assistance, +2 for safety/equipment, Sit to/from stand Lower Body  Dressing Details (indicate cue type and reason): attempting to don socks with multiple attempts, initially donning socks on hands Toilet Transfer: +2 for physical assistance, +2 for safety/equipment, Ambulation, Total assistance Toilet Transfer Details (indicate cue type and reason): using stedy, but min assist +2 to stand via steady and RW Toileting- Clothing Manipulation and Hygiene: Total assistance, Bed level Toileting - Clothing Manipulation Details (indicate cue type and reason): incontient bowel, assist for pad change bed level Functional mobility during ADLs: Minimal assistance, +2 for physical assistance, +2 for safety/equipment General ADL Comments: Patient continues to require significant assist for all aspects of care    Mobility  Overal bed mobility: Needs Assistance Bed Mobility: Rolling, Sidelying to Sit Rolling: Used rails, Min assist Sidelying to sit: Min assist, Used rails Supine to sit: Mod assist, +2 for physical assistance, +2 for safety/equipment, HOB elevated, Used rails Sit to supine: Max assist, +2 for physical assistance, +2 for safety/equipment Sit to sidelying: Min assist General bed mobility comments: Pt using rails and legs to roll self L <> R for pericare, minA to direct pt. Verbal and tactile cues  provided to try to facilitate pt pushing up on the bed to ascend trunk and sit up, but pt repeatedly bringing legs back up on the bed and needed maxA to initiate trunk ascension before she actively engaged and needed modAx2 to complete the transition to sitting. Pt able to scoot anteriorly to edge with minA-CGA    Transfers  Overall transfer level: Needs assistance Equipment used: Ambulation equipment used, Rolling walker (2 wheels) Transfers: Sit to/from Stand, Bed to chair/wheelchair/BSC Sit to Stand: +2 safety/equipment, +2 physical assistance, Min assist Bed to/from chair/wheelchair/BSC transfer type:: Via Lift equipment Step pivot transfers: Mod assist, +2 physical assistance Transfer via Lift Equipment: Stedy General transfer comment: Pt needed minAx2 to come to stand from EOB to stedy 1x, from stedy flaps 1x, and from recliner to RW 1x. Verbal and tactile cues provided for hand placement and to shift weight anteriorly to stand each rep    Ambulation / Gait / Stairs / Wheelchair Mobility  Ambulation/Gait Ambulation/Gait assistance: Mod assist, Min assist, +2 safety/equipment, +2 physical assistance Gait Distance (Feet): 4 Feet Assistive device: Rolling walker (2 wheels) Gait Pattern/deviations: Step-to pattern, Decreased step length - left, Decreased step length - right, Decreased stride length, Decreased dorsiflexion - right, Decreased dorsiflexion - left, Decreased weight shift to right, Decreased weight shift to left, Knee flexed in stance - left, Knee flexed in stance - right, Shuffle, Trunk flexed General Gait Details: Pt ambulates with short, shuffling, unsteady steps with bil knees slightly flexed. Pt needed tactile cues to shift her weight and advance/clear her foot to step. MinAx2 for balance stepping anteriorly. Pt impulsive to sit prematurely and needing modAx2 to safely direct her to the chair. Gait velocity: reduced Gait velocity interpretation: <1.31 ft/sec, indicative of  household ambulator    Posture / Balance Dynamic Sitting Balance Sitting balance - Comments: CGA to sit statically EOB and to lift bil legs into bil knee extension while sitting EOB Balance Overall balance assessment: Needs assistance Sitting-balance support: No upper extremity supported, Feet supported Sitting balance-Leahy Scale: Fair Sitting balance - Comments: CGA to sit statically EOB and to lift bil legs into bil knee extension while sitting EOB Postural control: Right lateral lean Standing balance support: Bilateral upper extremity supported, During functional activity Standing balance-Leahy Scale: Poor Standing balance comment: R lateral lean as she fatigued, min-modAx2    Special needs/care consideration Continuous Drip  IV  KVO with IV antibiotic and Behavioral consideration Has h/o dementia, is confused.   Previous Home Environment (from acute therapy documentation) Living Arrangements: Children (son and daughter and grandkids) Available Help at Discharge: Family, Available 24 hours/day Type of Home: House Home Layout: Two level, Able to live on main level with bedroom/bathroom Home Access: Level entry Bathroom Shower/Tub: Tub/shower unit, Health visitor: Standard Additional Comments: granddaughter providing info and not fully sure on info  Discharge Living Setting Plans for Discharge Living Setting: House, Lives with (comment) (Lives with son and DIL and their 90 and 33 yo children) Type of Home at Discharge: House Discharge Home Layout: Multi-level, Other (Comment) (Split level with patient's bedroom and 1/2 bath lower level) Alternate Level Stairs-Number of Steps: 5 steps to downstairs and 7 to upstairs level Discharge Home Access: Level entry (Level entry garage level to patient's area.) Discharge Bathroom Shower/Tub: Tub/shower unit, Curtain Forensic scientist is on upper level) Discharge Bathroom Toilet: Standard Discharge Bathroom Accessibility: Yes How  Accessible: Accessible via walker Does the patient have any problems obtaining your medications?: No  Social/Family/Support Systems Patient Roles: Parent, Other (Comment) (Has son, dtr, DIL, grand children.) Contact Information: Daniyah Cragle - son - [phone] Anticipated Caregiver: DIL, caregiver and family Anticipated Caregiver's Contact Information: [place] - DIL - [phone] Caregiver Availability: 24/7 Discharge Plan Discussed with Primary Caregiver: Yes Is Caregiver In Agreement with Plan?: Yes Does Caregiver/Family have Issues with Lodging/Transportation while Pt is in Rehab?: No  Goals Patient/Family Goal for Rehab: PT/OT/SLP supervision goals Expected length of stay: 10-12 days Cultural Considerations: From [place], close to [place], 13 years in the [place]. Pt/Family Agrees to Admission and willing to participate: Yes Program Orientation Provided & Reviewed with Pt/Caregiver Including Roles  & Responsibilities: Yes  Decrease burden of Care through IP rehab admission: N/A  Possible need for SNF placement upon discharge: No  Patient Condition: I have reviewed medical records from [place], spoken with CM, and son, daughter, and family member. I met with patient at the bedside and discussed via phone for inpatient rehabilitation assessment.  Patient will benefit from ongoing PT, OT, and SLP, can actively participate in 3 hours of therapy a day 5 days of the week, and can make measurable gains during the admission.  Patient will also benefit from the coordinated team approach during an Inpatient Acute Rehabilitation admission.  The patient will receive intensive therapy as well as Rehabilitation physician, nursing, social worker, and care management interventions.  Due to bladder management, bowel management, safety, skin/wound care, disease management, medication administration, pain management, and patient education the patient requires 24 hour a day rehabilitation  nursing.  The patient is currently min to mod assist with mobility and min to max assist with basic ADLs.  Discharge setting and therapy post discharge at home with home health is anticipated.  Patient has agreed to participate in the Acute Inpatient Rehabilitation Program and will admit today.  Preadmission Screen Completed By:  Trish Mage, 06/27/2023 11:58 AM ______________________________________________________________________   Discussed status with Dr. Riley Kill on 06/27/23 at 0945 and received approval for admission today.  Admission Coordinator:  Trish Mage, RN, time 1218/Date 06/27/23   Assessment/Plan: Diagnosis:Subarachnoid hemorrhage/aneurysmal rupture  Does the need for close, 24 hr/day Medical supervision in concert with the patient's rehab needs make it unreasonable for this patient to be served in a less intensive setting? Yes Co-Morbidities requiring supervision/potential complications: Delirium, acute on chronic diastolic CHF, prolonged QT, history of essential hypertension, acute  pulmonary edema, aspiration pneumonia, dysphagia, diarrhea, hypokalemia, asthma, steroid-induced hyperglycemia, anemia of critical illness, leukocytosis, urinary retention Due to bladder management, bowel management, safety, skin/wound care, disease management, medication administration, pain management, and patient education, does the patient require 24 hr/day rehab nursing? Yes Does the patient require coordinated care of a physician, rehab nurse, PT, OT, and SLP to address physical and functional deficits in the context of the above medical diagnosis(es)? Yes Addressing deficits in the following areas: balance, endurance, locomotion, strength, transferring, bowel/bladder control, bathing, dressing, feeding, grooming, toileting, cognition, speech, language, swallowing, and psychosocial support Can the patient actively participate in an intensive therapy program of at least 3 hrs of therapy 5 days a week?  Yes The potential for patient to make measurable gains while on inpatient rehab is excellent Anticipated functional outcomes upon discharge from inpatient rehab: supervision PT, supervision OT, supervision SLP Estimated rehab length of stay to reach the above functional goals is: 10-12 Anticipated discharge destination: Home 10. Overall Rehab/Functional Prognosis: excellent   MD Signature: Fanny Dance

## 2023-06-27 NOTE — Progress Notes (Addendum)
IP rehab admissions - I met with daughter and DIL at the bedside and spoke with son by phone.  Patient is confused and has dementia per family.  Family in agreement to inpatient rehab admission and then home with the family.  Family do not want SNF.  Patient will have a caregiver for 4-5 hours a day and then family will assist with care.  Family stay with patient 24/7 at the bedside at this time.  Family are interpreting and stay to prevent depression in patient.  Patient speaks [place] and is from [place].  We do have a bed available on inpatient rehab today if MD feels that patient is medically ready.  Call me for questions.  [phone]  MD has cleared patient for admission to CIR today.  Will admit today.  [phone]

## 2023-06-27 NOTE — H&P (Signed)
Physical Medicine and Rehabilitation Admission H&P   CC: Functional deficits secondary to [facility]  HPI: Yolanda [name] is an 89 year old female presented to ED on morning of 06/10/2023 with acute onset of severe headache. Code stroke initiated and neurology consulted. CT head with diffuse SAH. 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 Neurosurgery consulted and evaluated by Dr. Jordan Likes. Consulted with Dr. Conchita Paris Began routine grade 3 SAH pathway including Nimotop 60mg  Q 4 Hrs. Arrangements made for diagnostic angiogram possible endovascular treatment of LMCA aneurysm. 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 PCCM managed post-procedurally. Deveolped acute encephalopathy and agitated delirium. Required vasopressor support and intubation 8/20. Started ceftriaxone for aspiration coverage. Repeat CT head 8/21 with stable SAH, no hydrocephalus. Cortrak placed and TF started. Chest xray consistent with acute pulmonary edema and decompensated hfpef. Lasix given. Extubated 8/23. Required resumption of Precedex. No clinical spasm noted by NS and was moved out of ICU on 8/28. TRH assumed care.  Started on Unasyn for aspiration pneumonia/right lung opacity. Will complete 21 day course of nimodipine. SLP eval. Diet advanced to D3 with thin liquids. Patient is strict vegetarian. Cortrak removed.  Family did not want PEG tube, p.o. intake has improved.  She required In-N-Out cath several times and was started on Flomax for urinary retention.  She needs cues and assistance to initiate transitioning supine to sit but demonstrates improved active initiation to stand when cued, only needing minAx2 for transfers. The patient requires inpatient medicine and rehabilitation evaluations and services for ongoing dysfunction secondary to [facility].   Review of Systems  Constitutional:  Negative for fever.  Eyes:  Negative  for double vision.  Respiratory:  Negative for shortness of breath.   Cardiovascular:  Negative for chest pain.  Gastrointestinal:  Negative for nausea and vomiting.  Neurological:  Positive for weakness.   Past Medical History:  Diagnosis Date   Closed fracture of left proximal humerus 03/27/2021   Closed fracture of lower end of left radius with routine healing 10/31/2020   Dementia (HCC)    Mild intermittent asthma without complication 04/04/2019   OSTEOARTHRITIS 08/04/2010   Annotation: Bilateral knees and ankles Qualifier: Diagnosis of  By: Delrae Alfred MD, Lanora Manis     Past Surgical History:  Procedure Laterality Date   IR 3D INDEPENDENT WKST  06/10/2023   IR ANGIO INTRA EXTRACRAN SEL INTERNAL CAROTID UNI L MOD SED  06/11/2023   IR ANGIOGRAM FOLLOW UP STUDY  06/10/2023   IR ANGIOGRAM FOLLOW UP STUDY  06/10/2023   IR NEURO EACH ADD'L AFTER BASIC UNI LEFT (MS)  06/10/2023   IR TRANSCATH/EMBOLIZ  06/10/2023   RADIOLOGY WITH ANESTHESIA N/A 06/10/2023   Procedure: IR WITH ANESTHESIA;  Surgeon: Lisbeth Renshaw, MD;  Location: [facility] OR;  Service: Radiology;  Laterality: N/A;   No family history on file. Social History:  reports that she has never smoked. She does not have any smokeless tobacco history on file. She reports that she does not drink alcohol and does not use drugs. Allergies:  Allergies  Allergen Reactions   Meat Extract Other (See Comments)    Pt vegetarian    Medications Prior to Admission  Medication Sig Dispense Refill   acetaminophen (TYLENOL) 500 MG tablet Take 1 tablet (500 mg total) by mouth 3 (three) times daily. 30 tablet 0   arformoterol (BROVANA) 15 MCG/2ML NEBU Take 2 mLs (15 mcg total) by nebulization  2 (two) times daily. 120 mL 0   [START ON 06/28/2023] bisoprolol (ZEBETA) 5 MG tablet Take 1 tablet (5 mg total) by mouth daily. 30 tablet 0   budesonide (PULMICORT) 0.5 MG/2ML nebulizer solution Take 2 mLs (0.5 mg total) by nebulization 2 (two) times daily. 15 mL 12    niMODipine (NIMOTOP) 30 MG capsule Take 2 capsules (60 mg total) by mouth every 4 (four) hours for 4 days. 48 capsule 0   ramelteon (ROZEREM) 8 MG tablet Take 1 tablet (8 mg total) by mouth at bedtime. 10 tablet 0   saccharomyces boulardii (FLORASTOR) 250 MG capsule Take 1 capsule (250 mg total) by mouth 2 (two) times daily. 30 capsule 0   [START ON 06/28/2023] sertraline (ZOLOFT) 25 MG tablet Take 1 tablet (25 mg total) by mouth daily. 30 tablet 0   tamsulosin (FLOMAX) 0.4 MG CAPS capsule Take 1 capsule (0.4 mg total) by mouth daily after supper. 30 capsule 0   traZODone (DESYREL) 100 MG tablet Take 1 tablet (100 mg total) by mouth at bedtime. 30 tablet 0      Home: Home Living Family/patient expects to be discharged to:: Private residence Living Arrangements: Children (son and daughter and grandkids) Available Help at Discharge: Family, Available 24 hours/day Type of Home: House Home Access: Level entry Home Layout: Two level, Able to live on main level with bedroom/bathroom Bathroom Shower/Tub: Tub/shower unit, Health visitor: Standard Home Equipment: Cane - single point, Information systems manager Additional Comments: granddaughter providing info and not fully sure on info   Functional History: Prior Function Prior Level of Function : Needs assist Mobility Comments: No AD, mod I ADLs Comments: Needs intermittent assistance for dressing and ADLs   Functional Status:  Mobility: Bed Mobility Overal bed mobility: Needs Assistance Bed Mobility: Rolling, Sidelying to Sit Rolling: Used rails, Min assist Sidelying to sit: Min assist, Used rails Supine to sit: Mod assist, +2 for physical assistance, +2 for safety/equipment, HOB elevated, Used rails Sit to supine: Max assist, +2 for physical assistance, +2 for safety/equipment Sit to sidelying: Min assist General bed mobility comments: Pt using rails and legs to roll self L <> R for pericare, minA to direct pt. Verbal and tactile cues  provided to try to facilitate pt pushing up on the bed to ascend trunk and sit up, but pt repeatedly bringing legs back up on the bed and needed maxA to initiate trunk ascension before she actively engaged and needed modAx2 to complete the transition to sitting. Pt able to scoot anteriorly to edge with minA-CGA Transfers Overall transfer level: Needs assistance Equipment used: Ambulation equipment used, Rolling walker (2 wheels) Transfers: Sit to/from Stand, Bed to chair/wheelchair/BSC Sit to Stand: +2 safety/equipment, +2 physical assistance, Min assist Bed to/from chair/wheelchair/BSC transfer type:: Via Lift equipment Step pivot transfers: Mod assist, +2 physical assistance Transfer via Lift Equipment: Stedy General transfer comment: Pt needed minAx2 to come to stand from EOB to stedy 1x, from stedy flaps 1x, and from recliner to RW 1x. Verbal and tactile cues provided for hand placement and to shift weight anteriorly to stand each rep Ambulation/Gait Ambulation/Gait assistance: Mod assist, Min assist, +2 safety/equipment, +2 physical assistance Gait Distance (Feet): 4 Feet Assistive device: Rolling walker (2 wheels) Gait Pattern/deviations: Step-to pattern, Decreased step length - left, Decreased step length - right, Decreased stride length, Decreased dorsiflexion - right, Decreased dorsiflexion - left, Decreased weight shift to right, Decreased weight shift to left, Knee flexed in stance - left, Knee flexed  in stance - right, Shuffle, Trunk flexed General Gait Details: Pt ambulates with short, shuffling, unsteady steps with bil knees slightly flexed. Pt needed tactile cues to shift her weight and advance/clear her foot to step. MinAx2 for balance stepping anteriorly. Pt impulsive to sit prematurely and needing modAx2 to safely direct her to the chair. Gait velocity: reduced Gait velocity interpretation: <1.31 ft/sec, indicative of household ambulator   ADL: ADL Overall ADL's : Needs  assistance/impaired Eating/Feeding: Minimal assistance, Sitting Grooming: Minimal assistance, Sitting, Wash/dry face Grooming Details (indicate cue type and reason): washing face with R hand, would need assist for bimanual tasks Upper Body Dressing : Maximal assistance, Bed level Upper Body Dressing Details (indicate cue type and reason): to change gown Lower Body Dressing: Total assistance, +2 for physical assistance, +2 for safety/equipment, Sit to/from stand Lower Body Dressing Details (indicate cue type and reason): attempting to don socks with multiple attempts, initially donning socks on hands Toilet Transfer: +2 for physical assistance, +2 for safety/equipment, Ambulation, Total assistance Toilet Transfer Details (indicate cue type and reason): using stedy, but min assist +2 to stand via steady and RW Toileting- Clothing Manipulation and Hygiene: Total assistance, Bed level Toileting - Clothing Manipulation Details (indicate cue type and reason): incontient bowel, assist for pad change bed level Functional mobility during ADLs: Minimal assistance, +2 for physical assistance, +2 for safety/equipment General ADL Comments: Patient continues to require significant assist for all aspects of care   Cognition: Cognition Overall Cognitive Status: Impaired/Different from baseline Arousal/Alertness: Awake/alert Orientation Level: Oriented to person Attention: Focused Focused Attention: Impaired Focused Attention Impairment: Verbal basic (per family, repetition of information was necessary at home prior to admission) Memory: Impaired Memory Impairment: Retrieval deficit Awareness: Impaired Awareness Impairment: Intellectual impairment Problem Solving: Impaired Problem Solving Impairment: Verbal basic, Functional basic Behaviors: Restless Safety/Judgment: Impaired (bilateral mittins in place) Cognition Arousal: Alert Behavior During Therapy: Flat affect Overall Cognitive Status:  Impaired/Different from baseline Area of Impairment: Attention, Following commands, Safety/judgement, Awareness, Problem solving, Memory Orientation Level: Disoriented to, Place, Time, Situation Current Attention Level: Focused Memory: Decreased short-term memory Following Commands: Follows one step commands with increased time, Follows one step commands inconsistently Safety/Judgement: Decreased awareness of safety, Decreased awareness of deficits Awareness: Intellectual Problem Solving: Slow processing, Difficulty sequencing, Requires verbal cues, Requires tactile cues, Decreased initiation General Comments: Pt knew she was at a hospital and reports she is here due to a headache, but did not seem to understand her diagnosis. Follows simple commands with extra time ~70% of the time. No awareness of bowel incontinence and having her feet up in it. Poor safety awareness, sitting prematurely during gait training.      Physical Exam: Blood pressure (!) 90/55, pulse (!) 55, temperature 98.1 F (36.7 C), temperature source Oral, resp. rate 16, weight 57.1 kg, SpO2 96%. Physical Exam  General: No acute distress HEENT: Head is normocephalic, atraumatic, PERRLA, sclera anicteric, oral mucosa pink and moist, has some white coating on her tongue  Neck: Supple without JVD or lymphadenopathy Heart: Reg rate and rhythm. No murmurs rubs or gallops Chest: CTA bilaterally without wheezes, rales, or rhonchi; no distress Abdomen: Soft, non-tender, non-distended, bowel sounds positive. Extremities: No clubbing, cyanosis, or edema. Pulses are 2+ Psych: Flat Skin: Clean and intact without signs of breakdown Small area of redness on right wrist-much smaller than area outlined with marker family reports this has been improving Neuro: Communicated with help of interpreter.  Alert and oriented to person, hospital with choices, not oriented to  situation or date.  When asked why she is in the hospital she says to  get well and get treated.  Confused.  Continually tries to turn to her right side and close her eyes.  Able to name 2 out of 3 items.  Intermittent following of commands with encouragement.  Conjugate gaze-she would not follow extraocular movement testing, Left upper extremity strength grossly 4 out of 5 Right upper extremity strength grossly 4- out of 5 Bilateral lower extremity strength grossly 4 out of 5 MMT limited by difficulty following commands Sensation intact to light touch in all 4 extremities Musculoskeletal:  No joint swelling or tenderness noted No abnormal tone appreciated  Results for orders placed or performed during the hospital encounter of 06/27/23 (from the past 48 hour(s))  CBC     Status: Abnormal   Collection Time: 06/27/23  4:08 PM  Result Value Ref Range   WBC 10.9 (H) 4.0 - 10.5 K/uL   RBC 3.66 (L) 3.87 - 5.11 MIL/uL   Hemoglobin 10.3 (L) 12.0 - 15.0 g/dL   HCT 16.1 (L) 09.6 - 04.5 %   MCV 89.1 80.0 - 100.0 fL   MCH 28.1 26.0 - 34.0 pg   MCHC 31.6 30.0 - 36.0 g/dL   RDW 40.9 81.1 - 91.4 %   Platelets 361 150 - 400 K/uL   nRBC 0.0 0.0 - 0.2 %    Comment: Performed at [facility], [address] [place], [place] [address]  Creatinine, serum     Status: None   Collection Time: 06/27/23  4:08 PM  Result Value Ref Range   Creatinine, Ser 0.60 0.44 - 1.00 mg/dL   GFR, Estimated >62 >13 mL/min    Comment: (NOTE) Calculated using the CKD-EPI Creatinine Equation (2021) Performed at [facility], [address] [place], [place] [address]    No results found.    Blood pressure (!) 90/55, pulse (!) 55, temperature 98.1 F (36.7 C), temperature source Oral, resp. rate 16, weight 57.1 kg, SpO2 96%.  Medical Problem List and Plan: 1. Functional deficits secondary to subarachnoid hemorrhage/aneurysm rupture status post coil embolization of left MCA aneurysm  -patient may  shower  -ELOS/Goals: 10-12 days, PT/OT/SLP supervision goals  -Admit to  CIR  2.  Antithrombotics: -DVT/anticoagulation:  Pharmaceutical: Heparin  -antiplatelet therapy: Aspirin and Plavix for three weeks followed by aspirin alone  3. Pain Management: Tylenol as needed  4. Mood/Behavior/Sleep: LCSW to evaluate and provide emotional support  -continue Rozerem 8 mg q HS  -continue Zoloft 25 mg daily  -continue trazodone 100 mg q HS  -antipsychotic agents: n/a  5. Neuropsych/cognition: This patient is not capable of making decisions on her own behalf.  6. Skin/Wound Care: Routine skin care checks   7. Fluids/Electrolytes/Nutrition: Routine Is and Os and follow-up chemistries  -vegetarian diet/dysphagia 3; continue Ensure  -Dysphagia- SLP eval, monitor p.o. intake  8: Hypertension: monitor TID and prn  -continue bisoprolol 5 mg daily  9: Hyperlipidemia: continue statin  10: SAH: continue nimodipine 60 mg q 4 hours through 9/09  11: Urinary retention: continue Flomax  -observe voiding and consider d/c  12: Loose stool: started on Florastor  13: Aspiration pneumonia: abx completed; history of asthma.   -continue Brovana and Pulmicort nebulizers BID  -consider MDI in place of nebs  -FV/IS  14: Prediabetes: A1c = 5.8%  -CBGs improving off TF and steroids (check BID)  15: Leukocytosis: improving; steroids may have been contributing; Unasyn completed 9/05;  remains afebrile  -follow-up CBC with diff  16: Acute anemia of critical illness: no reports of bleeding/symptoms; ? dilutional  -follow-up CBC  17: Acute diastolic CHF/garde 1 diastolic dysfunction/pulm edema/elevated BNP  -received Lasix, EF =  70-75%  -daily weight, monitor for signs of fluid overload  18.  Acute delirium.  Improving.  Continue trazodone 100 mg at bedtime.    Milinda Antis, PA-C 06/27/2023  I have personally performed a face to face diagnostic evaluation of this patient and formulated the key components of the plan.  Additionally, I have personally reviewed laboratory  data, imaging studies, as well as relevant notes and concur with the physician assistant's documentation above.  The patient's status has not changed from the original H&P.  Any changes in documentation from the acute care chart have been noted above.  Fanny Dance, MD, [place]

## 2023-06-27 NOTE — Progress Notes (Signed)
Nutrition Follow-up  DOCUMENTATION CODES:   Not applicable  INTERVENTION:   - Continue Ensure Enlive po BID, each supplement provides 350 kcal and 20 grams of protein  - Continue Magic Cup BID with lunch and dinner meals, each supplement provides 290 kcal and 9 grams of protein  NUTRITION DIAGNOSIS:   Inadequate oral intake related to inability to eat as evidenced by NPO status.  Progressing, pt now on dysphagia 3 diet with thin liquids  GOAL:   Patient will meet greater than or equal to 90% of their needs  Progressing  MONITOR:   PO intake, Supplement acceptance, Weight trends, I & O's  REASON FOR ASSESSMENT:   Consult Enteral/tube feeding initiation and management  ASSESSMENT:   Pt with PMH of dementia admitted with [facility] s/p L MCA aneurysm coil embolization.  A 48-hour calorie count was completed. Pt found to be meeting >100% of minimum kcal needs and 81% of minimum protein needs on day 2. Cortrak was removed yesterday. SLP followed up with pt, and diet was advanced to dysphagia 3. Will continue with current oral nutrition supplements.  Discharged planning is ongoing. Noted pt is a potential candidate for CIR.  Admit weight: 58 kg Current weight: 56.9 kg  Medications reviewed and include: Ensure Enlive BID, SSI every 4 hours, florastor  Labs reviewed. CBG's: 100-133 x 24 hours  UOP: 1450 ml x 24 hours FMS: 700 ml + 3 unmeasured occurrences x 24 hours I/O's: +1.2 L since admit  Diet Order:   Diet Order             DIET DYS 3 Room service appropriate? Yes with Assist; Fluid consistency: Thin  Diet effective now                   EDUCATION NEEDS:   No education needs have been identified at this time  Skin:  Skin Assessment: Reviewed RN Assessment  Last BM:  06/27/23 FMS  Height:   Ht Readings from Last 1 Encounters:  06/10/23 4\' 8"  (1.422 m)    Weight:   Wt Readings from Last 1 Encounters:  06/27/23 56.9 kg    BMI:  Body mass index is  28.12 kg/m.  Estimated Nutritional Needs:   Kcal:  1200-1400  Protein:  70-85 grams  Fluid:  > 1.5 L/day    Mertie Clause, MS, RD, LDN Registered Dietitian II Please see AMiON for contact information.

## 2023-06-28 ENCOUNTER — Inpatient Hospital Stay (HOSPITAL_COMMUNITY): Payer: Medicare Other

## 2023-06-28 DIAGNOSIS — R609 Edema, unspecified: Secondary | ICD-10-CM | POA: Diagnosis not present

## 2023-06-28 LAB — CBC WITH DIFFERENTIAL/PLATELET
Abs Immature Granulocytes: 0.32 10*3/uL — ABNORMAL HIGH (ref 0.00–0.07)
Basophils Absolute: 0.1 10*3/uL (ref 0.0–0.1)
Basophils Relative: 1 %
Eosinophils Absolute: 0.3 10*3/uL (ref 0.0–0.5)
Eosinophils Relative: 3 %
HCT: 34.7 % — ABNORMAL LOW (ref 36.0–46.0)
Hemoglobin: 11.1 g/dL — ABNORMAL LOW (ref 12.0–15.0)
Immature Granulocytes: 3 %
Lymphocytes Relative: 11 %
Lymphs Abs: 1.1 10*3/uL (ref 0.7–4.0)
MCH: 28.9 pg (ref 26.0–34.0)
MCHC: 32 g/dL (ref 30.0–36.0)
MCV: 90.4 fL (ref 80.0–100.0)
Monocytes Absolute: 0.6 10*3/uL (ref 0.1–1.0)
Monocytes Relative: 6 %
Neutro Abs: 7.3 10*3/uL (ref 1.7–7.7)
Neutrophils Relative %: 76 %
Platelets: 342 10*3/uL (ref 150–400)
RBC: 3.84 MIL/uL — ABNORMAL LOW (ref 3.87–5.11)
RDW: 14.4 % (ref 11.5–15.5)
WBC: 9.6 10*3/uL (ref 4.0–10.5)
nRBC: 0 % (ref 0.0–0.2)

## 2023-06-28 LAB — COMPREHENSIVE METABOLIC PANEL
ALT: 37 U/L (ref 0–44)
AST: 29 U/L (ref 15–41)
Albumin: 2.7 g/dL — ABNORMAL LOW (ref 3.5–5.0)
Alkaline Phosphatase: 78 U/L (ref 38–126)
Anion gap: 9 (ref 5–15)
BUN: 12 mg/dL (ref 8–23)
CO2: 25 mmol/L (ref 22–32)
Calcium: 8.7 mg/dL — ABNORMAL LOW (ref 8.9–10.3)
Chloride: 101 mmol/L (ref 98–111)
Creatinine, Ser: 0.61 mg/dL (ref 0.44–1.00)
GFR, Estimated: >60 mL/min (ref >60)
Glucose, Bld: 109 mg/dL — ABNORMAL HIGH (ref 70–99)
Potassium: 4.3 mmol/L (ref 3.5–5.1)
Sodium: 135 mmol/L (ref 135–145)
Total Bilirubin: 0.9 mg/dL (ref 0.3–1.2)
Total Protein: 6.6 g/dL (ref 6.5–8.1)

## 2023-06-28 LAB — MAGNESIUM: Magnesium: 2.3 mg/dL (ref 1.7–2.4)

## 2023-06-28 LAB — VITAMIN D 25 HYDROXY (VIT D DEFICIENCY, FRACTURES): Vit D, 25-Hydroxy: 27.59 ng/mL — ABNORMAL LOW (ref 30–100)

## 2023-06-28 MED ORDER — ACETAMINOPHEN 325 MG PO TABS
650.0000 mg | ORAL_TABLET | Freq: Three times a day (TID) | ORAL | Status: DC
  Administered 2023-06-28 – 2023-07-05 (×20): 650 mg via ORAL
  Filled 2023-06-28 (×20): qty 2

## 2023-06-28 MED ORDER — TOPIRAMATE 25 MG PO TABS
25.0000 mg | ORAL_TABLET | Freq: Two times a day (BID) | ORAL | Status: DC
  Administered 2023-06-28: 25 mg via ORAL
  Filled 2023-06-28: qty 1

## 2023-06-28 MED ORDER — TOPIRAMATE 25 MG PO TABS
25.0000 mg | ORAL_TABLET | Freq: Every day | ORAL | Status: DC
  Administered 2023-06-29: 25 mg via ORAL
  Filled 2023-06-28: qty 1

## 2023-06-28 MED ORDER — VITAMIN D (ERGOCALCIFEROL) 1.25 MG (50000 UNIT) PO CAPS
50000.0000 [IU] | ORAL_CAPSULE | ORAL | Status: DC
  Administered 2023-06-28: 50000 [IU] via ORAL
  Filled 2023-06-28 (×2): qty 1

## 2023-06-28 NOTE — Discharge Summary (Signed)
Physician Discharge Summary  Patient ID: Yolanda [name] MRN: [id number] DOB/AGE: 03-15-1933 89 y.o.  Admit date: 06/27/2023 Discharge date: 07/05/2023  Discharge Diagnoses:  Principal Problem:   Subarachnoid hemorrhage (HCC) Active problems: Functional deficits secondary to subarachnoid hemorrhage History of underlying dementia Hypertension Hyperlipidemia Urinary retention Loose stool Aspiration pneumonia Prediabetes Leukocytosis Anemia of acute critical illness Acute diastolic congestive heart failure Acute delirium  Discharged Condition: stable  Significant Diagnostic Studies:  Narrative & Impression  CLINICAL DATA:  Headache, increasing frequency or severity   EXAM: CT HEAD WITHOUT CONTRAST   TECHNIQUE: Contiguous axial images were obtained from the base of the skull through the vertex without intravenous contrast.   RADIATION DOSE REDUCTION: This exam was performed according to the departmental dose-optimization program which includes automated exposure control, adjustment of the mA and/or kV according to patient size and/or use of iterative reconstruction technique.   COMPARISON:  CT Head 06/12/23   FINDINGS: Brain: There is a likely small volume subarachnoid hemorrhage along the left cerebral convexity (series 3, image 23-25). No hydrocephalus. No extra-axial fluid collection. No evidence of intraventricular blood products. No CT evidence of an acute cortical infarct. Partially empty sella.   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   Skull: Normal. Negative for fracture or focal lesion.   Sinuses/Orbits: 2 no middle ear or mastoid effusion. Paranasal sinuses are notable for polypoid mucosal thickening in the left maxillary sinus. Orbits are unremarkable.   Other: None.   IMPRESSION: 1. Small volume residual subarachnoid hemorrhage along the left cerebral convexity. This has decreased compared to 06/10/23 2.  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     Electronically Signed   By: Lorenza Cambridge M.D.   On: 07/03/2023 14:26      Lower Venous DVT Study   Patient Name:  Yolanda [name]  Date of Exam:   06/28/2023  Medical Rec #: [id number]     Accession #:    [id number]  Date of Birth: Mar 04, 1933     Patient Gender: F  Patient Age:   89 years  Exam Location:  [facility]  Procedure:      VAS [place] LOWER EXTREMITY VENOUS (DVT)  Referring Phys: Wendi Maya    ---------------------------------------------------------------------------  -----    Indications: "edema".  Other Indications: Rehab patient.   Limitations: Difficulty imaging left lower extremity due to patient lying  on right side.  Comparison Study: No previous exams   Performing Technologist: Jody Hill RVT, RDMS  Examination Guidelines:  A complete evaluation includes B-mode imaging, spectral Doppler, color  Doppler,  and power Doppler as needed of all accessible portions of each vessel.  Bilateral  testing is considered an integral part of a complete examination. Limited  examinations for reoccurring indications may be performed as noted. The  reflux  portion of the exam is performed with the patient in reverse  Trendelenburg.  +---------+---------------+---------+-----------+----------+--------------+   RIGHT   CompressibilityPhasicitySpontaneityPropertiesThrombus  Aging  +---------+---------------+---------+-----------+----------+--------------+   CFV     Full           Yes      Yes                                   +---------+---------------+---------+-----------+----------+--------------+   SFJ     Full                                                          +---------+---------------+---------+-----------+----------+--------------+  FV Prox  Full           Yes      Yes                                    +---------+---------------+---------+-----------+----------+--------------+   FV Mid   Full           Yes      Yes                                   +---------+---------------+---------+-----------+----------+--------------+   FV DistalFull           Yes      Yes                                   +---------+---------------+---------+-----------+----------+--------------+   PFV     Full                                                          +---------+---------------+---------+-----------+----------+--------------+   POP     Full           Yes      Yes                                   +---------+---------------+---------+-----------+----------+--------------+   PTV     Full                                                          +---------+---------------+---------+-----------+----------+--------------+   PERO    Full                                                          +---------+---------------+---------+-----------+----------+--------------+   +--------+---------------+---------+-----------+----------+----------------  ----+  LEFT   CompressibilityPhasicitySpontaneityPropertiesThrombus Aging         +--------+---------------+---------+-----------+----------+----------------  ----+  CFV    Full           Yes      Yes                                         +--------+---------------+---------+-----------+----------+----------------  ----+  SFJ    Full                                                                +--------+---------------+---------+-----------+----------+----------------  ----+  FV Prox Full  Yes      Yes                                         +--------+---------------+---------+-----------+----------+----------------  ----+  FV Mid  Full           Yes      Yes                                          +--------+---------------+---------+-----------+----------+----------------  ----+  FV     Full           Yes      Yes                                         Distal                                                                      +--------+---------------+---------+-----------+----------+----------------  ----+  PFV                   Yes      Yes                  patent by                                                                   color/doppler          +--------+---------------+---------+-----------+----------+----------------  ----+  POP    Full           Yes      Yes                                         +--------+---------------+---------+-----------+----------+----------------  ----+  PTV    Full                                                                +--------+---------------+---------+-----------+----------+----------------  ----+  PERO   Full                                                                +--------+---------------+---------+-----------+----------+----------------  ----+   Summary:  BILATERAL:  - No evidence of deep vein thrombosis  seen in the lower extremities,  bilaterally.  -No evidence of popliteal cyst, bilaterally.   See table(s) above for measurements and observations.  Electronically signed by Heath Lark on 06/30/2023 at 11:13:00 AM.   Final     Labs:  Basic Metabolic Panel: Recent Labs  Lab 07/01/23 0713  NA 135  K 3.9  CL 105  CO2 23  GLUCOSE 111*  BUN 12  CREATININE 0.74  CALCIUM 9.0    CBC: Recent Labs  Lab 07/01/23 0713  WBC 7.6  HGB 11.5*  HCT 36.5  MCV 89.0  PLT 329    Brief HPI:   Yolanda [name] is a 89 y.o. female presented to ED on morning of 06/10/2023 with acute onset of severe headache. Code stroke initiated and neurology consulted. CT head with diffuse SAH. 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  Neurosurgery consulted and evaluated by Dr. Jordan Likes. Consulted with Dr. Conchita Paris Began routine grade 3 SAH pathway including Nimotop 60mg  Q 4 Hrs. Arrangements made for diagnostic angiogram possible endovascular treatment of LMCA aneurysm. 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 PCCM managed post-procedurally. Deveolped acute encephalopathy and agitated delirium. Required vasopressor support and intubation 8/20. Started ceftriaxone for aspiration coverage. Repeat CT head 8/21 with stable SAH, no hydrocephalus. Cortrak placed and TF started. Chest xray consistent with acute pulmonary edema and decompensated hfpef. Lasix given. Extubated 8/23. Required resumption of Precedex. No clinical spasm noted by NS and was moved out of ICU on 8/28. TRH assumed care.  Started on Unasyn for aspiration pneumonia/right lung opacity. Will complete 21 day course of nimodipine. SLP eval. Diet advanced to D3 with thin liquids. Patient is strict vegetarian. Cortrak removed.  Family did not want PEG tube, p.o. intake has improved.  She required In-N-Out cath several times and was started on Flomax for urinary retention.  She needs cues and assistance to initiate transitioning supine to sit but demonstrates improved active initiation to stand when cued, only needing minAx2 for transfers.   Hospital Course: Yolanda [name] was admitted to rehab 06/27/2023 for inpatient therapies to consist of PT, ST and OT at least three hours five days a week. Past admission physiatrist, therapy team and rehab RN have worked together to provide customized collaborative inpatient rehab.  Topamax added for headache on admission.  Follow-up labs revealed hypoalbuminemia, low serum vitamin D level and improved anemia.  Leukocytosis resolved.  Bilateral lower extremity venous duplex negative for DVT.  Topamax increased to 100 mg nightly 9/09.  Fioricet started as needed for  headache.  Nimodipine completed 9/09. Reported worsening headache morning of 9/10.  CT head performed 9/11>> results stable. All discharge medications and instructions reviewed with patient's sons.  Blood pressures were monitored on TID basis and bisoprolol 5 mg daily continued.  Glucose has been monitored with BID CBG checks now off steroids and TF.  Some hyperglycemia noted and discontinued Ensure between meals.  Rehab course: During patient's stay in rehab weekly team conferences were held to monitor patient's progress, set goals and discuss barriers to discharge. At admission, patient required mod with basic self-care skills and mod with mobility.  She has had improvement in activity tolerance, balance, postural control as well as ability to compensate for deficits. She has had improvement in functional use RUE/LUE  and RLE/LLE as well as improvement in awareness.  Patient has met 9 of 12 long term goals due  to improved activity tolerance, improved balance, functional use of  RIGHT upper and RIGHT lower extremity, and improved attention.  Patient to discharge at [facility] Assist level.  Patient's care partner is independent to provide the necessary physical and cognitive assistance at discharge.      Home health Pt, OT, SP arranged.  Discharge disposition: 06-Home-Health Care Svc     Diet: heart healthy  Special Instructions:  No driving, alcohol consumption or tobacco use.  Recommend daily BP measurement in same arm and record time of day. Bring this information with you to follow-up appointment with PCP.  Follow-up with PCP regarding any maintenance inhalers for asthma.  30-35 minutes were spent on discharge planning and discharge summary.  Discharge Instructions     Ambulatory referral to Physical Medicine Rehab   Complete by: As directed    Hospital follow-up   Discharge patient   Complete by: As directed    Discharge disposition: 06-Home-Health Care Svc   Discharge  patient date: 07/05/2023      Allergies as of 07/05/2023       Reactions   Meat Extract Other (See Comments)   Pt vegetarian         Medication List     STOP taking these medications    arformoterol 15 MCG/2ML Nebu Commonly known as: BROVANA   budesonide 0.5 MG/2ML nebulizer solution Commonly known as: PULMICORT   niMODipine 30 MG capsule Commonly known as: NIMOTOP   saccharomyces boulardii 250 MG capsule Commonly known as: FLORASTOR       TAKE these medications    acetaminophen 325 MG tablet Commonly known as: TYLENOL Take 2 tablets (650 mg total) by mouth 3 (three) times daily as needed. What changed:  medication strength how much to take when to take this reasons to take this   bisoprolol 5 MG tablet Commonly known as: ZEBETA Take 1 tablet (5 mg total) by mouth daily.   butalbital-acetaminophen-caffeine 50-325-40 MG tablet Commonly known as: FIORICET Take 1 tablet by mouth every 6 (six) hours as needed for headache.   gabapentin 100 MG capsule Commonly known as: NEURONTIN Take 1 capsule (100 mg total) by mouth 3 (three) times daily.   ramelteon 8 MG tablet Commonly known as: ROZEREM Take 1 tablet (8 mg total) by mouth at bedtime.   sertraline 25 MG tablet Commonly known as: ZOLOFT Take 1 tablet (25 mg total) by mouth daily.   tamsulosin 0.4 MG Caps capsule Commonly known as: FLOMAX Take 1 capsule (0.4 mg total) by mouth daily after supper.   topiramate 100 MG tablet Commonly known as: TOPAMAX Take 1 tablet (100 mg total) by mouth at bedtime.   traZODone 100 MG tablet Commonly known as: DESYREL Take 1 tablet (100 mg total) by mouth at bedtime.   Vitamin D (Ergocalciferol) 1.25 MG (50000 UNIT) Caps capsule Commonly known as: DRISDOL Take 1 capsule (50,000 Units total) by mouth every 7 (seven) days. Take each Friday until finished.        Follow-up Information     Raulkar, Drema Pry, MD Follow up.   Specialty: Physical Medicine and  Rehabilitation Why: office will call you to arrange your appt (sent) Contact information: [address] [place] [address] [phone]         Annett Fabian, MD. Go to.   Specialty: Internal Medicine Why: 07/11/2023 at 8:45 am Contact information: [address] [place] [address] [phone]         Lisbeth Renshaw,  MD Follow up.   Specialty: Neurosurgery Why: Call the office in 1-2 days to make arrangements for hospital follow-up appointment. Contact information: [address] [place] [address] [phone]                 Signed: Milinda Antis 07/05/2023, 9:39 AM

## 2023-06-28 NOTE — Progress Notes (Signed)
Inpatient Rehabilitation Center Individual Statement of Services  Patient Name:  Yolanda [name]  Date:  06/28/2023  Welcome to the Inpatient Rehabilitation Center.  Our goal is to provide you with an individualized program based on your diagnosis and situation, designed to meet your specific needs.  With this comprehensive rehabilitation program, you will be expected to participate in at least 3 hours of rehabilitation therapies Monday-Friday, with modified therapy programming on the weekends.  Your rehabilitation program will include the following services:  Physical Therapy (PT), Occupational Therapy (OT), Speech Therapy (ST), 24 hour per day rehabilitation nursing, Therapeutic Recreaction (TR), Care Coordinator, Rehabilitation Medicine, Nutrition Services, and Pharmacy Services  Weekly team conferences will be held on Wednesday to discuss your progress.  Your Inpatient Rehabilitation Care Coordinator will talk with you frequently to get your input and to update you on team discussions.  Team conferences with you and your family in attendance may also be held.  Expected length of stay: 12-14 days   Overall anticipated outcome: supervision/CGA level with cueing   Depending on your progress and recovery, your program may change. Your Inpatient Rehabilitation Care Coordinator will coordinate services and will keep you informed of any changes. Your Inpatient Rehabilitation Care Coordinator's name and contact numbers are listed  below.  The following services may also be recommended but are not provided by the Inpatient Rehabilitation Center:   Home Health Rehabiltiation Services Outpatient Rehabilitation Services    Arrangements will be made to provide these services after discharge if needed.  Arrangements include referral to agencies that provide these services.  Your insurance has been verified to be:  medicare and medicaid Your primary doctor is:    Pertinent information will be shared  with your doctor and your insurance company.  Inpatient Rehabilitation Care Coordinator:  Dossie Der, [place] [phone] or Luna Glasgow  Information discussed with and copy given to patient by: Lucy Chris, 06/28/2023, 12:11 PM

## 2023-06-28 NOTE — Progress Notes (Signed)
BLE venous duplex has been completed.    Results can be found under chart review under CV PROC. 06/28/2023 5:07 PM Dozier Berkovich RVT, RDMS

## 2023-06-28 NOTE — Progress Notes (Signed)
Inpatient Rehabilitation  Patient information reviewed and entered into eRehab system by Melissa M. Bowie, M.A., CCC/SLP, PPS Coordinator.  Information including medical coding, functional ability and quality indicators will be reviewed and updated through discharge.    

## 2023-06-28 NOTE — Progress Notes (Signed)
PROGRESS NOTE   Subjective/Complaints: C/o headache, topamax added, will switch to night so as not to oversedate in the morning Discussed labs with family  ROS: +headache  Objective:   No results found. Recent Labs    06/27/23 1608 06/28/23 0622  WBC 10.9* 9.6  HGB 10.3* 11.1*  HCT 32.6* 34.7*  PLT 361 342   Recent Labs    06/26/23 1412 06/27/23 1608 06/28/23 0622  NA 135  --  135  K 3.7  --  4.3  CL 101  --  101  CO2 26  --  25  GLUCOSE 143*  --  109*  BUN 13  --  12  CREATININE 0.60 0.60 0.61  CALCIUM 8.0*  --  8.7*    Intake/Output Summary (Last 24 hours) at 06/28/2023 1048 Last data filed at 06/28/2023 0826 Gross per 24 hour  Intake 320 ml  Output 1223 ml  Net -903 ml        Physical Exam: Vital Signs Blood pressure (!) 148/82, pulse 71, temperature 97.8 F (36.6 C), temperature source Oral, resp. rate 18, weight 57.1 kg, SpO2 98%. Gen: no distress, normal appearing HEENT: oral mucosa pink and moist, NCAT Cardio: Reg rate Chest: normal effort, normal rate of breathing Abd: soft, non-distended Ext: no edema Psych: pleasant, normal affect Skin: Clean and intact without signs of breakdown Small area of redness on right wrist-much smaller than area outlined with marker family reports this has been improving Neuro: Communicated with help of interpreter.  Alert and oriented to person, hospital with choices, not oriented to situation or date.  When asked why she is in the hospital she says to get well and get treated.  Confused.  Continually tries to turn to her right side and close her eyes.  Able to name 2 out of 3 items.  Intermittent following of commands with encouragement.  Conjugate gaze-she would not follow extraocular movement testing, Left upper extremity strength grossly 4 out of 5 Right upper extremity strength grossly 4- out of 5 Bilateral lower extremity strength grossly 4 out of 5 MMT  limited by difficulty following commands Sensation intact to light touch in all 4 extremities Musculoskeletal:  No joint swelling or tenderness noted No abnormal tone appreciated     Assessment/Plan: 1. Functional deficits which require 3+ hours per day of interdisciplinary therapy in a comprehensive inpatient rehab setting. Physiatrist is providing close team supervision and 24 hour management of active medical problems listed below. Physiatrist and rehab team continue to assess barriers to discharge/monitor patient progress toward functional and medical goals  Care Tool:  Bathing    Body parts bathed by patient: Chest, Face, Right upper leg, Left upper leg   Body parts bathed by helper: Right arm, Left arm, Left lower leg, Right lower leg, Front perineal area, Buttocks     Bathing assist Assist Level: Moderate Assistance - Patient 50 - 74%     Upper Body Dressing/Undressing Upper body dressing   What is the patient wearing?: Dress    Upper body assist Assist Level: Moderate Assistance - Patient 50 - 74%    Lower Body Dressing/Undressing Lower body dressing  What is the patient wearing?: Incontinence brief     Lower body assist Assist for lower body dressing: Dependent - Patient 0%     Toileting Toileting Toileting Activity did not occur (Clothing management and hygiene only): N/A (no void or bm)  Toileting assist       Transfers Chair/bed transfer  Transfers assist     Chair/bed transfer assist level: Moderate Assistance - Patient 50 - 74%     Locomotion Ambulation   Ambulation assist              Walk 10 feet activity   Assist           Walk 50 feet activity   Assist           Walk 150 feet activity   Assist           Walk 10 feet on uneven surface  activity   Assist           Wheelchair     Assist               Wheelchair 50 feet with 2 turns activity    Assist             Wheelchair 150 feet activity     Assist          Blood pressure (!) 148/82, pulse 71, temperature 97.8 F (36.6 C), temperature source Oral, resp. rate 18, weight 57.1 kg, SpO2 98%.  Medical Problem List and Plan: 1. Functional deficits secondary to subarachnoid hemorrhage/aneurysm rupture status post coil embolization of left MCA aneurysm             -patient may  shower             -ELOS/Goals: 10-12 days, PT/OT/SLP supervision goals             -Admit to CIR   2.  Antithrombotics: -DVT/anticoagulation:  Pharmaceutical: Heparin             -antiplatelet therapy: Aspirin and Plavix for three weeks followed by aspirin alone   3. Headaches: topamax ordered HS. Tylenol as needed   4. Mood/Behavior/Sleep: LCSW to evaluate and provide emotional support             -continue Rozerem 8 mg q HS             -continue Zoloft 25 mg daily             -continue trazodone 100 mg q HS             -antipsychotic agents: n/a   5. Neuropsych/cognition: This patient is not capable of making decisions on her own behalf.   6. Skin/Wound Care: Routine skin care checks   7. Fluids/Electrolytes/Nutrition: Routine Is and Os and follow-up chemistries             -vegetarian diet/dysphagia 3; continue Ensure             -Dysphagia- SLP eval, monitor p.o. intake   8: Hypertension: monitor TID and prn             -continue bisoprolol 5 mg daily   9: Hyperlipidemia: continue statin   10: SAH: continue nimodipine 60 mg q 4 hours through 9/09   11: Urinary retention: continue Flomax             -observe voiding and consider d/c   12: Loose stool: started on Florastor   13: Aspiration pneumonia: abx completed; history  of asthma.              -continue Brovana and Pulmicort nebulizers BID             -consider MDI in place of nebs             -FV/IS   14: Prediabetes: A1c = 5.8%             -CBGs improving off TF and steroids (check BID), provided list of foods for improved blood sugar  control   15: Leukocytosis: improving; steroids may have been contributing; Unasyn completed 9/05; remains afebrile             -follow-up CBC with diff   16: Acute anemia of critical illness: discussed that hemoglobin is improving  17: Acute diastolic CHF/garde 1 diastolic dysfunction/pulm edema/elevated BNP             -received Lasix, EF =  70-75%             -daily weight, monitor for signs of fluid overload   18.  Acute delirium.  Improving.  Continue trazodone 100 mg at bedtime.  19. Overweight: encouraged avoiding added sugars in drinks  20. Hypoalbuminemia: encouraged whole food sources of protein  I spent >22mins performing patient care related activities, including face to face time, documentation time, med management, discussion of meds and lab orders with patient, and overall coordination of care.                LOS: 1 days A FACE TO FACE EVALUATION WAS PERFORMED  Yolanda [name] Yolanda [name] 06/28/2023, 10:48 AM

## 2023-06-28 NOTE — Evaluation (Signed)
Occupational Therapy Assessment and Plan  Patient Details  Name: Yolanda [name] MRN: [id number] Date of Birth: 07-18-1933  OT Diagnosis: abnormal posture, acute pain, altered mental status, cognitive deficits, disturbance of vision, hemiplegia affecting dominant side, and muscle weakness (generalized) Rehab Potential: Rehab Potential (ACUTE ONLY): Good ELOS: 12-14 days   Today's Date: 06/28/2023 OT Individual Time: 3474-2595 OT Individual Time Calculation (min): 55 min     Hospital Problem: Principal Problem:   Subarachnoid hemorrhage (HCC)   Past Medical History:  Past Medical History:  Diagnosis Date   Closed fracture of left proximal humerus 03/27/2021   Closed fracture of lower end of left radius with routine healing 10/31/2020   Dementia (HCC)    Mild intermittent asthma without complication 04/04/2019   OSTEOARTHRITIS 08/04/2010   Annotation: Bilateral knees and ankles Qualifier: Diagnosis of  By: Delrae Alfred MD, Lanora Manis     Past Surgical History:  Past Surgical History:  Procedure Laterality Date   IR 3D INDEPENDENT WKST  06/10/2023   IR ANGIO INTRA EXTRACRAN SEL INTERNAL CAROTID UNI L MOD SED  06/11/2023   IR ANGIOGRAM FOLLOW UP STUDY  06/10/2023   IR ANGIOGRAM FOLLOW UP STUDY  06/10/2023   IR NEURO EACH ADD'L AFTER BASIC UNI LEFT (MS)  06/10/2023   IR TRANSCATH/EMBOLIZ  06/10/2023   RADIOLOGY WITH ANESTHESIA N/A 06/10/2023   Procedure: IR WITH ANESTHESIA;  Surgeon: Lisbeth Renshaw, MD;  Location: [facility] OR;  Service: Radiology;  Laterality: N/A;    Assessment & Plan Clinical Impression: Yolanda [name] is an 89 year old female presented to ED on morning of 06/10/2023 with acute onset of severe headache. CT head with diffuse SAH. 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  Deveolped acute encephalopathy and agitated delirium.  Required vasopressor support and intubation 8/20.   Patient transferred to CIR on 06/27/2023 .    Patient currently requires mod with basic self-care skills secondary to decreased cardiorespiratoy endurance, motor apraxia and decreased coordination, decreased visual perceptual skills, decreased attention to right, decreased initiation, decreased attention, decreased awareness, decreased problem solving, decreased safety awareness, decreased memory, and delayed processing, and decreased standing balance, decreased postural control, hemiplegia, and decreased balance strategies.  Prior to hospitalization, patient could complete BADL with intermittent min assistance.  Patient will benefit from skilled intervention to decrease level of assist with basic self-care skills and increase independence with basic self-care skills prior to discharge home with care partner.  Anticipate patient will require 24 hour supervision and follow up home health.  OT - End of Session Activity Tolerance: Decreased this session Endurance Deficit: Yes Endurance Deficit Description: difficulty maintaining upright position, pain, fatigue, confusion OT Assessment Rehab Potential (ACUTE ONLY): Good OT Patient demonstrates impairments in the following area(s): Balance;Motor;Sensory;Behavior;Skin Integrity;Cognition;Pain;Vision;Perception;Endurance;Safety OT Basic ADL's Functional Problem(s): Eating;Grooming;Bathing;Dressing;Toileting OT Transfers Functional Problem(s): Toilet;Tub/Shower OT Additional Impairment(s): None OT Plan OT Intensity: Minimum of 1-2 x/day, 45 to 90 minutes OT Frequency: 5 out of 7 days OT Duration/Estimated Length of Stay: 12-14 days OT Treatment/Interventions: Balance/vestibular training;Neuromuscular re-education;Patient/family education;Self Care/advanced ADL retraining;Therapeutic Exercise;UE/LE Coordination activities;Visual/perceptual remediation/compensation;UE/LE Strength taining/ROM;Therapeutic  Activities;Pain management;Functional mobility training;DME/adaptive equipment instruction;Discharge planning;Cognitive remediation/compensation OT Self Feeding Anticipated Outcome(s): supervission OT Basic Self-Care Anticipated Outcome(s): supervission OT Toileting Anticipated Outcome(s): supervission OT Bathroom Transfers Anticipated Outcome(s): supervission OT Recommendation Patient destination: Home Follow Up Recommendations: Home health OT Equipment Recommended: To be determined  OT Evaluation Precautions/Restrictions  Precautions Precautions: Fall Precaution Comments: SBP goal <150 Restrictions Weight Bearing Restrictions: No   Vital Signs Therapy Vitals BP: (!) 128/54 Patient Position (if appropriate): Sitting Oxygen Therapy SpO2: 100 % O2 Device: Room Air Pain Pain Assessment Pain Scale: 0-10 Pain Score: 6  Faces Pain Scale: Hurts little more Pain Type: Acute pain Pain Location: Head Pain Orientation: Right Pain Descriptors / Indicators: Headache Pain Frequency: Constant Pain Onset: On-going Patients Stated Pain Goal: 0 Pain Intervention(s): Medication (See eMAR);Therapeutic touch;Repositioned;RN made aware Multiple Pain Sites: No Home Living/Prior Functioning Home Living Family/patient expects to be discharged to:: Private residence Living Arrangements: Children, Other relatives Available Help at Discharge: Family, Available 24 hours/day Type of Home: House Home Access: Stairs to enter [employer] of Steps: 3 Home Layout: Two level, Able to live on main level with bedroom/bathroom  Lives With: Son Prior Function Level of Independence: Independent with gait, Independent with transfers, Requires assistive device for independence, Needs assistance with ADLs  Able to Take Stairs?: Yes Vocation: Retired Lobbyist to See in Adequate Light: 0 Adequate Patient Visual Report: Eye fatigue/eye pain/headache Vision Assessment?: Vision impaired-  to be further tested in functional context Perception  Perception: Impaired Perception-Other Comments: mild right inattention Praxis Praxis: WFL Cognition Cognition Overall Cognitive Status: Impaired/Different from baseline Arousal/Alertness: Lethargic Orientation Level: Place;Person Memory: Impaired Memory Impairment: Storage deficit;Retrieval deficit Decreased Short Term Memory: Functional basic Attention: Sustained Focused Attention: Appears intact Focused Attention Impairment: Functional basic Sustained Attention: Impaired Sustained Attention Impairment: Functional basic Awareness: Impaired Awareness Impairment: Intellectual impairment Problem Solving: Impaired Problem Solving Impairment: Functional basic Executive Function: Initiating;Self Monitoring Initiating: Impaired Initiating Impairment: Functional basic Self Monitoring: Impaired Self Monitoring Impairment: Functional basic Behaviors: Perseveration;Restless Safety/Judgment: Impaired Brief Interview for Mental Status (BIMS) Repetition of Three Words (First Attempt): None Temporal Orientation: Month: No answer Temporal Orientation: Day: No answer Recall: "Sock": No answer Recall: "Blue": No answer Recall: "Bed": No answer BIMS Summary Score: 99 Sensation Sensation Light Touch: Impaired by gross assessment Hot/Cold: Not tested Proprioception: Impaired by gross assessment Stereognosis: Not tested Coordination Gross Motor Movements are Fluid and Coordinated: Yes Fine Motor Movements are Fluid and Coordinated: No Motor  Motor Motor: Hemiplegia Motor - Skilled Clinical Observations: mild right sided weakness/ inattention  Trunk/Postural Assessment  Cervical Assessment Cervical Assessment: Exceptions to [facility] Thoracic Assessment Thoracic Assessment: Exceptions to [facility] (leans back behind base of support) Lumbar Assessment Lumbar Assessment: Exceptions to [facility] Postural Control Postural Control: Deficits on  evaluation Trunk Control: Limited anterior / posterior control - all forward, or all backward Protective Responses: premature with forward weight shsift, delayed to R  Balance Balance Balance Assessed: Yes Static Sitting Balance Static Sitting - Balance Support: No upper extremity supported;Feet supported Static Sitting - Level of Assistance: 4: Min assist Dynamic Sitting Balance Dynamic Sitting - Balance Support: No upper extremity supported;Feet supported;During functional activity Dynamic Sitting - Level of Assistance: 4: Min Oncologist Standing - Balance Support: Left upper extremity supported;Right upper extremity supported;During functional activity Static Standing - Level of Assistance: 4: Min assist Dynamic Standing Balance Dynamic Standing - Balance Support: Right upper extremity supported;Left upper extremity supported;During functional activity Dynamic Standing - Level of Assistance: 4: Min assist;3: Mod assist Extremity/Trunk Assessment RUE Assessment RUE Assessment: Within Functional Limits Active Range of Motion (AROM) Comments: Some difficulty maintaining RUE at 90* shoulder flex against gravity LUE Assessment LUE Assessment: Within Functional Limits  Care Tool Care Tool Self Care Eating Eating activity did not  occur: Safety/medical concerns      Oral Care    Oral Care Assist Level: Minimal Assistance - Patient > 75%    Bathing   Body parts bathed by patient: Chest;Face;Right upper leg;Left upper leg Body parts bathed by helper: Right arm;Left arm;Left lower leg;Right lower leg;Front perineal area;Buttocks   Assist Level: Moderate Assistance - Patient 50 - 74%    Upper Body Dressing(including orthotics)   What is the patient wearing?: Dress   Assist Level: Moderate Assistance - Patient 50 - 74%    Lower Body Dressing (excluding footwear)   What is the patient wearing?: Incontinence brief Assist for lower body dressing: Dependent  - Patient 0%    Putting on/Taking off footwear   What is the patient wearing?: Socks Assist for footwear: Total Assistance - Patient < 25%       Care Tool Toileting Toileting activity Toileting Activity did not occur (Clothing management and hygiene only): N/A (no void or bm)       Care Tool Bed Mobility Roll left and right activity   Roll left and right assist level: Minimal Assistance - Patient > 75%    Sit to lying activity   Sit to lying assist level: Minimal Assistance - Patient > 75%    Lying to sitting on side of bed activity   Lying to sitting on side of bed assist level: the ability to move from lying on the back to sitting on the side of the bed with no back support.: Minimal Assistance - Patient > 75%     Care Tool Transfers Sit to stand transfer   Sit to stand assist level: Minimal Assistance - Patient > 75%    Chair/bed transfer   Chair/bed transfer assist level: Moderate Assistance - Patient 50 - 74%     Toilet transfer Toilet transfer activity did not occur: Refused       Care Tool Cognition  Expression of Ideas and Wants Expression of Ideas and Wants: 3. Some difficulty - exhibits some difficulty with expressing needs and ideas (e.g, some words or finishing thoughts) or speech is not clear  Understanding Verbal and Non-Verbal Content Understanding Verbal and Non-Verbal Content: 3. Usually understands - understands most conversations, but misses some part/intent of message. Requires cues at times to understand   Memory/Recall Ability Memory/Recall Ability : That he or she is in a hospital/hospital unit   Refer to Care Plan for Long Term Goals  SHORT TERM GOAL WEEK 1 OT Short Term Goal 1 (Week 1): Patient will indicate need to void and transfer to commode with min assist OT Short Term Goal 2 (Week 1): Patient will don socks with mod assist OT Short Term Goal 3 (Week 1): Patient will sequence bathing and dressing with minimal cueing OT Short Term Goal 4  (Week 1): Patient will don pull over dress/ shirt with set up assistance OT Short Term Goal 5 (Week 1): Patient will utilize RUE as dominant for grooming or feeding tasks 25%/time  Recommendations for other services: None    Skilled Therapeutic Intervention: Patient received sidelying in bed with lights out.  Difficult to arouse initially - patient with complaint of headache - RN aware and reports giving pain medication already. Patient assisted to sitting at edge of bed and reported dizziness.  Patient when asked indicated she may feel she needs to vomit.  Patient held hand over head right temple.  Patient did not vomit.  With time, accomodated to upright.  Transferred to wheelchair via  stand step.  Completed simple BADL at sink as patient declines shower.  Patient returned to bed at end of session and bed alarm engaged, call bell placed beside patient.   ADL ADL Eating: Unable to assess Grooming: Minimal assistance Where Assessed-Grooming: Sitting at sink;Standing at sink Upper Body Bathing: Minimal assistance Where Assessed-Upper Body Bathing: Sitting at sink Lower Body Bathing: Maximal assistance Where Assessed-Lower Body Bathing: Sitting at sink;Standing at sink Upper Body Dressing: Moderate assistance Where Assessed-Upper Body Dressing: Wheelchair Lower Body Dressing: Maximal assistance Where Assessed-Lower Body Dressing: Sitting at sink;Standing at sink Toileting: Unable to assess Toilet Transfer: Not assessed Tub/Shower Transfer: Unable to assess Tub/Shower Transfer Method: Unable to assess Film/video editor: Unable to assess Film/video editor Method: Unable to assess Mobility  Bed Mobility Bed Mobility: Rolling Right;Rolling Left;Supine to Sit;Sit to Supine Rolling Right: Minimal Assistance - Patient > 75% Rolling Left: Minimal Assistance - Patient > 75% Supine to Sit: Minimal Assistance - Patient > 75% Sit to Supine: Minimal Assistance - Patient >  75% Transfers Sit to Stand: Minimal Assistance - Patient > 75% Stand to Sit: Minimal Assistance - Patient > 75%   Discharge Criteria: Patient will be discharged from OT if patient refuses treatment 3 consecutive times without medical reason, if treatment goals not met, if there is a change in medical status, if patient makes no progress towards goals or if patient is discharged from hospital.  The above assessment, treatment plan, treatment alternatives and goals were discussed and mutually agreed upon: No family available/patient unable  Collier Salina 06/28/2023, 1:38 PM

## 2023-06-28 NOTE — Evaluation (Signed)
Physical Therapy Assessment and Plan  Patient Details  Name: Yolanda [name] MRN: [id number] Date of Birth: 1933/01/21  PT Diagnosis: Difficulty walking, Hemiparesis dominant, Impaired cognition, and Impaired sensation Rehab Potential: Fair ELOS: 12-14 days   Today's Date: 06/28/2023 PT Individual Time: 1300-1400 PT Individual Time Calculation (min): 60 min  and Today's Date: 06/28/2023 PT Missed Time: 15 Minutes Missed Time Reason: Patient fatigue;Pain   Hospital Problem: Principal Problem:   Subarachnoid hemorrhage (HCC)   Past Medical History:  Past Medical History:  Diagnosis Date   Closed fracture of left proximal humerus 03/27/2021   Closed fracture of lower end of left radius with routine healing 10/31/2020   Dementia (HCC)    Mild intermittent asthma without complication 04/04/2019   OSTEOARTHRITIS 08/04/2010   Annotation: Bilateral knees and ankles Qualifier: Diagnosis of  By: Delrae Alfred MD, Lanora Manis     Past Surgical History:  Past Surgical History:  Procedure Laterality Date   IR 3D INDEPENDENT WKST  06/10/2023   IR ANGIO INTRA EXTRACRAN SEL INTERNAL CAROTID UNI L MOD SED  06/11/2023   IR ANGIOGRAM FOLLOW UP STUDY  06/10/2023   IR ANGIOGRAM FOLLOW UP STUDY  06/10/2023   IR NEURO EACH ADD'L AFTER BASIC UNI LEFT (MS)  06/10/2023   IR TRANSCATH/EMBOLIZ  06/10/2023   RADIOLOGY WITH ANESTHESIA N/A 06/10/2023   Procedure: IR WITH ANESTHESIA;  Surgeon: Lisbeth Renshaw, MD;  Location: [facility] OR;  Service: Radiology;  Laterality: N/A;    Assessment & Plan Clinical Impression:  Yolanda [name] is an 89 year old female presented to ED on morning of 06/10/2023 with acute onset of severe headache. Code stroke initiated and neurology consulted. CT head with diffuse SAH. 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 Neurosurgery consulted and evaluated by Dr. Jordan Likes. Consulted with Dr. Conchita Paris Began routine grade 3 SAH pathway including Nimotop 60mg  Q 4 Hrs.  Arrangements made for diagnostic angiogram possible endovascular treatment of LMCA aneurysm. 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 PCCM managed post-procedurally. Deveolped acute encephalopathy and agitated delirium. Required vasopressor support and intubation 8/20. Started ceftriaxone for aspiration coverage. Repeat CT head 8/21 with stable SAH, no hydrocephalus. Cortrak placed and TF started. Chest xray consistent with acute pulmonary edema and decompensated hfpef. Lasix given. Extubated 8/23. Required resumption of Precedex. No clinical spasm noted by NS and was moved out of ICU on 8/28. TRH assumed care.  Started on Unasyn for aspiration pneumonia/right lung opacity. Will complete 21 day course of nimodipine. SLP eval. Diet advanced to D3 with thin liquids. Patient is strict vegetarian. Cortrak removed.  Family did not want PEG tube, p.o. intake has improved.  She required In-N-Out cath several times and was started on Flomax for urinary retention.  She needs cues and assistance to initiate transitioning supine to sit but demonstrates improved active initiation to stand when cued, only needing minAx2 for transfers. The patient requires inpatient medicine and rehabilitation evaluations and services for ongoing dysfunction secondary to [facility].   Patient transferred to CIR on 06/27/2023 .   Patient currently requires mod with mobility secondary to muscle weakness, unbalanced muscle activation, decreased awareness, decreased problem solving, and decreased safety awareness, and decreased standing balance, decreased postural control, hemiplegia, and decreased balance strategies.  Prior to hospitalization, patient was supervision with mobility and lived with Son in a House home.  Home access is 3Stairs to enter.  Patient will benefit from skilled  PT intervention to maximize safe functional mobility, minimize fall risk, and  decrease caregiver burden for planned discharge home with 24 hour supervision.  Anticipate patient will benefit from follow up HH at discharge.  PT - End of Session Activity Tolerance: Tolerates 10 - 20 min activity with multiple rests Endurance Deficit: Yes Endurance Deficit Description: difficulty maintaining upright position, pain, fatigue, confusion PT Assessment Rehab Potential (ACUTE/IP ONLY): Fair PT Barriers to Discharge: Decreased caregiver support;Home environment access/layout PT Patient demonstrates impairments in the following area(s): Balance;Pain;Behavior;Perception;Safety;Endurance;Sensory;Motor;Skin Integrity PT Transfers Functional Problem(s): Bed Mobility;Bed to Chair;Car PT Locomotion Functional Problem(s): Ambulation;Wheelchair Mobility;Stairs PT Plan PT Intensity: Minimum of 1-2 x/day ,45 to 90 minutes PT Frequency: 5 out of 7 days PT Duration Estimated Length of Stay: 12-14 days PT Treatment/Interventions: Ambulation/gait training;Cognitive remediation/compensation;Discharge planning;DME/adaptive equipment instruction;Functional mobility training;Pain management;Psychosocial support;Splinting/orthotics;Therapeutic Activities;UE/LE Strength taining/ROM;Visual/perceptual remediation/compensation;Wheelchair propulsion/positioning;UE/LE Coordination activities;Therapeutic Exercise;Stair training;Skin care/wound management;Patient/family education;Neuromuscular re-education;Functional electrical stimulation;Disease management/prevention;Community reintegration;Balance/vestibular training PT Transfers Anticipated Outcome(s): Supervision with LRAD PT Locomotion Anticipated Outcome(s): CGA with LRAD PT Recommendation Follow Up Recommendations: Home health PT Patient destination: Home Equipment Recommended: To be determined   PT Evaluation Precautions/Restrictions Precautions Precautions: Fall Precaution Comments: SBP goal <150 Restrictions Weight Bearing Restrictions:  No General PT Amount of Missed Time (min): 15 Minutes PT Missed Treatment Reason: Patient fatigue;Pain Vital SignsTherapy Vitals BP: (!) 128/54 Patient Position (if appropriate): Sitting Oxygen Therapy SpO2: 100 % O2 Device: Room Air Pain Pain Assessment Pain Scale: 0-10 Pain Score: 6  Faces Pain Scale: Hurts little more Pain Type: Acute pain Pain Location: Head Pain Orientation: Right Pain Descriptors / Indicators: Headache Pain Onset: On-going Patients Stated Pain Goal: 0 Pain Intervention(s): Medication (See eMAR);Therapeutic touch;Repositioned;RN made aware Pain Interference Pain Interference Pain Effect on Sleep: 4. Almost constantly Pain Interference with Therapy Activities: 3. Frequently Pain Interference with Day-to-Day Activities: 3. Frequently Home Living/Prior Functioning Home Living Available Help at Discharge: Family;Available 24 hours/day Type of Home: House Home Access: Stairs to enter [employer] of Steps: 3 Home Layout: Two level;Able to live on main level with bedroom/bathroom  Lives With: Son Prior Function Level of Independence: Independent with gait;Independent with transfers;Requires assistive device for independence;Needs assistance with ADLs  Able to Take Stairs?: Yes Vocation: Retired Vision/Perception  Vision - History Ability to See in Adequate Light: 0 Adequate Perception Perception: Impaired Preception Impairment Details: Inattention/Neglect Perception-Other Comments: mild right inattention Praxis Praxis: WFL  Cognition Overall Cognitive Status: Impaired/Different from baseline Arousal/Alertness: Lethargic Orientation Level: Oriented to person;Oriented to time;Oriented to situation Year: Other (Comment) ("I don't know") Month:  (Unable to state) Day of Week: Incorrect Attention: Sustained Focused Attention: Appears intact Focused Attention Impairment: Functional basic Sustained Attention: Impaired Sustained Attention  Impairment: Functional basic Memory: Impaired Memory Impairment: Storage deficit;Retrieval deficit Decreased Short Term Memory: Functional basic Awareness: Impaired Awareness Impairment: Intellectual impairment Problem Solving: Impaired Problem Solving Impairment: Functional basic Executive Function: Initiating;Self Monitoring Initiating: Impaired Initiating Impairment: Functional basic Self Monitoring: Impaired Self Monitoring Impairment: Functional basic Behaviors: Perseveration;Restless Safety/Judgment: Impaired Sensation Sensation Light Touch: Impaired by gross assessment Hot/Cold: Not tested Proprioception: Impaired by gross assessment Stereognosis: Not tested Coordination Gross Motor Movements are Fluid and Coordinated: Yes Fine Motor Movements are Fluid and Coordinated: No Motor  Motor Motor: Hemiplegia Motor - Skilled Clinical Observations: mild right sided weakness/ inattention   Trunk/Postural Assessment  Cervical Assessment Cervical Assessment: Within Functional Limits Thoracic Assessment Thoracic Assessment: Exceptions to WFL (kyphosis) Lumbar Assessment Lumbar Assessment: Exceptions to [facility] (PPT) Postural Control Postural Control: Deficits on evaluation  Trunk Control: Limited anterior / posterior control - all forward, or all backward Protective Responses: premature with forward weight shsift, delayed to R  Balance Balance Balance Assessed: Yes Static Sitting Balance Static Sitting - Balance Support: No upper extremity supported;Feet supported Static Sitting - Level of Assistance: 4: Min assist Dynamic Sitting Balance Dynamic Sitting - Balance Support: No upper extremity supported;Feet supported;During functional activity Dynamic Sitting - Level of Assistance: 4: Min assist Static Standing Balance Static Standing - Balance Support: Left upper extremity supported;Right upper extremity supported;During functional activity Static Standing - Level of  Assistance: 4: Min assist Dynamic Standing Balance Dynamic Standing - Balance Support: Right upper extremity supported;Left upper extremity supported;During functional activity Dynamic Standing - Level of Assistance: 4: Min assist;3: Mod assist Extremity Assessment  RUE Assessment RUE Assessment: Within Functional Limits Active Range of Motion (AROM) Comments: Some difficulty maintaining RUE at 90* shoulder flex against gravity LUE Assessment LUE Assessment: Within Functional Limits RLE Assessment RLE Assessment: Exceptions to [facility] General Strength Comments: Possible mild weakness, but difficult to assess d/t language barrier and cognition LLE Assessment LLE Assessment: Exceptions to [facility] General Strength Comments: Possible mild weakness, but difficult to assess d/t language barrier and cognition  Care Tool Care Tool Bed Mobility Roll left and right activity   Roll left and right assist level: Contact Guard/Touching assist    Sit to lying activity   Sit to lying assist level: Contact Guard/Touching assist    Lying to sitting on side of bed activity   Lying to sitting on side of bed assist level: the ability to move from lying on the back to sitting on the side of the bed with no back support.: Contact Guard/Touching assist     Care Tool Transfers Sit to stand transfer   Sit to stand assist level: Minimal Assistance - Patient > 75%    Chair/bed transfer   Chair/bed transfer assist level: Moderate Assistance - Patient 50 - 74%     Scientist, research (physical sciences) transfer activity did not occur: Safety/medical concerns (headache limits participation)        Care Tool Locomotion Ambulation   Assist level: 2 helpers (mod A with w/c follow) Assistive device: Walker-rolling Max distance: 13 ft  Walk 10 feet activity   Assist level: Moderate Assistance - Patient - 50 - 74% Assistive device: Walker-rolling   Walk 50 feet with 2 turns activity Walk 50 feet with 2 turns  activity did not occur: Safety/medical concerns      Walk 150 feet activity Walk 150 feet activity did not occur: Safety/medical concerns      Walk 10 feet on uneven surfaces activity Walk 10 feet on uneven surfaces activity did not occur: Safety/medical concerns      Stairs Stair activity did not occur: Safety/medical concerns        Walk up/down 1 step activity Walk up/down 1 step or curb (drop down) activity did not occur: Safety/medical concerns      Walk up/down 4 steps activity Walk up/down 4 steps activity did not occur: Safety/medical concerns      Walk up/down 12 steps activity Walk up/down 12 steps activity did not occur: Safety/medical concerns      Pick up small objects from floor Pick up small object from the floor (from standing position) activity did not occur: Safety/medical concerns      Wheelchair Is the patient using a wheelchair?: Yes Type of Wheelchair: Veterinary surgeon  assist level: Dependent - Patient 0% Max wheelchair distance: 150 ft  Wheel 50 feet with 2 turns activity   Assist Level: Dependent - Patient 0%  Wheel 150 feet activity   Assist Level: Dependent - Patient 0%    Refer to Care Plan for Long Term Goals  SHORT TERM GOAL WEEK 1 PT Short Term Goal 1 (Week 1): Pt will ambulate 30 ft with assist PT Short Term Goal 2 (Week 1): Pt will tolerate upright positioning >1 hour PT Short Term Goal 3 (Week 1): Pt will perform bed mobility with supervision consistently  Recommendations for other services: None   Skilled Therapeutic Intervention Evaluation completed (see details above) with patient education regarding purpose of PT evaluation, PT POC and goals, therapy schedule, weekly team meetings, and other CIR information including safety plan and fall risk safety. Pt was limited during eval d/t headache pain that started 4/10 at start of session and elevated when upright. Assessed BP as thought pt was dizzy (110/65) but then reports it was  headache and not dizziness.  Pt performed the below functional mobility tasks with the specified levels of skilled cuing and assistance.  Motor and sensory testing was difficult d/t cognitive and language barriers. Pt able to ambulate x 13 ft with HHA and hand rail, but unable to complete session after this. Pt missed x 15 min d/t pain and fatigue.   Mobility Bed Mobility Bed Mobility: Rolling Right;Rolling Left;Supine to Sit;Sit to Supine Rolling Right: Contact Guard/Touching assist Rolling Left: Contact Guard/Touching assist Supine to Sit: Contact Guard/Touching assist Sit to Supine: Contact Guard/Touching assist Transfers Transfers: Sit to Stand;Stand to Sit Sit to Stand: Minimal Assistance - Patient > 75% Stand to Sit: Minimal Assistance - Patient > 75% Transfer (Assistive device): None Locomotion  Gait Ambulation: Yes Gait Assistance: Moderate Assistance - Patient 50-74% Gait Distance (Feet): 13 Feet Assistive device: 1 person hand held assist;Other (Comment) (L hand rail) Gait Assistance Details: Manual facilitation for weight shifting;Manual facilitation for placement Gait Gait velocity: reduced Stairs / Additional Locomotion Stairs: No Wheelchair Mobility Wheelchair Mobility: No   Discharge Criteria: Patient will be discharged from PT if patient refuses treatment 3 consecutive times without medical reason, if treatment goals not met, if there is a change in medical status, if patient makes no progress towards goals or if patient is discharged from hospital.  The above assessment, treatment plan, treatment alternatives and goals were discussed and mutually agreed upon: by family  Juluis Rainier 06/28/2023, 3:54 PM

## 2023-06-28 NOTE — Plan of Care (Signed)
Problem: RH Balance Goal: LTG Patient will maintain dynamic standing with ADLs (OT) Description: LTG:  Patient will maintain dynamic standing balance with assist during activities of daily living (OT)  Flowsheets (Taken 06/28/2023 1340) LTG: Pt will maintain dynamic standing balance during ADLs with: Supervision/Verbal cueing   Problem: Sit to Stand Goal: LTG:  Patient will perform sit to stand in prep for activites of daily living with assistance level (OT) Description: LTG:  Patient will perform sit to stand in prep for activites of daily living with assistance level (OT) Flowsheets (Taken 06/28/2023 1340) LTG: PT will perform sit to stand in prep for activites of daily living with assistance level: Supervision/Verbal cueing   Problem: RH Eating Goal: LTG Patient will perform eating w/assist, cues/equip (OT) Description: LTG: Patient will perform eating with assist, with/without cues using equipment (OT) Flowsheets (Taken 06/28/2023 1340) LTG: Pt will perform eating with assistance level of: Supervision/Verbal cueing   Problem: RH Grooming Goal: LTG Patient will perform grooming w/assist,cues/equip (OT) Description: LTG: Patient will perform grooming with assist, with/without cues using equipment (OT) Flowsheets (Taken 06/28/2023 1340) LTG: Pt will perform grooming with assistance level of: Supervision/Verbal cueing   Problem: RH Bathing Goal: LTG Patient will bathe all body parts with assist levels (OT) Description: LTG: Patient will bathe all body parts with assist levels (OT) Flowsheets (Taken 06/28/2023 1340) LTG: Pt will perform bathing with assistance level/cueing: Supervision/Verbal cueing LTG: Position pt will perform bathing:  Shower  At sink   Problem: RH Dressing Goal: LTG Patient will perform upper body dressing (OT) Description: LTG Patient will perform upper body dressing with assist, with/without cues (OT). Flowsheets (Taken 06/28/2023 1340) LTG: Pt will perform upper body  dressing with assistance level of: Supervision/Verbal cueing Goal: LTG Patient will perform lower body dressing w/assist (OT) Description: LTG: Patient will perform lower body dressing with assist, with/without cues in positioning using equipment (OT) Flowsheets (Taken 06/28/2023 1340) LTG: Pt will perform lower body dressing with assistance level of: Supervision/Verbal cueing   Problem: RH Toileting Goal: LTG Patient will perform toileting task (3/3 steps) with assistance level (OT) Description: LTG: Patient will perform toileting task (3/3 steps) with assistance level (OT)  Flowsheets (Taken 06/28/2023 1340) LTG: Pt will perform toileting task (3/3 steps) with assistance level: Supervision/Verbal cueing   Problem: RH Toilet Transfers Goal: LTG Patient will perform toilet transfers w/assist (OT) Description: LTG: Patient will perform toilet transfers with assist, with/without cues using equipment (OT) Flowsheets (Taken 06/28/2023 1340) LTG: Pt will perform toilet transfers with assistance level of: Supervision/Verbal cueing   Problem: RH Tub/Shower Transfers Goal: LTG Patient will perform tub/shower transfers w/assist (OT) Description: LTG: Patient will perform tub/shower transfers with assist, with/without cues using equipment (OT) Flowsheets (Taken 06/28/2023 1340) LTG: Pt will perform tub/shower stall transfers with assistance level of: Supervision/Verbal cueing   Problem: RH Memory Goal: LTG Patient will demonstrate ability for day to day recall/carry over during activities of daily living with assistance level (OT) Description: LTG:  Patient will demonstrate ability for day to day recall/carry over during activities of daily living with assistance level (OT). Flowsheets (Taken 06/28/2023 1340) LTG:  Patient will demonstrate ability for day to day recall/carry over during activities of daily living with assistance level (OT): Minimal Assistance - Patient > 75%   Problem: RH Attention Goal:  LTG Patient will demonstrate this level of attention during functional activites (OT) Description: LTG:  Patient will demonstrate this level of attention during functional activites  (OT) Flowsheets (Taken 06/28/2023 1340) Patient  will demonstrate this level of attention during functional activites: Selective Patient will demonstrate above attention level in the following environment: Home LTG: Patient will demonstrate this level of attention during functional activites (OT): Minimal Assistance - Patient > 75%   Problem: RH Awareness Goal: LTG: Patient will demonstrate awareness during functional activites type of (OT) Description: LTG: Patient will demonstrate awareness during functional activites type of (OT) Flowsheets (Taken 06/28/2023 1340) Patient will demonstrate awareness during functional activites type of: Emergent LTG: Patient will demonstrate awareness during functional activites type of (OT): Minimal Assistance - Patient > 75%

## 2023-06-28 NOTE — Evaluation (Signed)
Speech Language Pathology Assessment and Plan  Patient Details  Name: Yolanda [name] MRN: [id number] Date of Birth: [birth year]  SLP Diagnosis: Cognitive Impairments;Dysphagia  Rehab Potential: Good ELOS: 12-14   Today's Date: 06/28/2023 SLP Individual Time: 1000-1054 SLP Individual Time Calculation (min): 54 min  Hospital Problem: Principal Problem:   Subarachnoid hemorrhage (HCC)  Past Medical History:  Past Medical History:  Diagnosis Date   Closed fracture of left proximal humerus 03/27/2021   Closed fracture of lower end of left radius with routine healing 10/31/2020   Dementia (HCC)    Mild intermittent asthma without complication 04/04/2019   OSTEOARTHRITIS 08/04/2010   Annotation: Bilateral knees and ankles Qualifier: Diagnosis of  By: Delrae Alfred MD, Lanora Manis     Past Surgical History:  Past Surgical History:  Procedure Laterality Date   IR 3D INDEPENDENT WKST  06/10/2023   IR ANGIO INTRA EXTRACRAN SEL INTERNAL CAROTID UNI L MOD SED  06/11/2023   IR ANGIOGRAM FOLLOW UP STUDY  06/10/2023   IR ANGIOGRAM FOLLOW UP STUDY  06/10/2023   IR NEURO EACH ADD'L AFTER BASIC UNI LEFT (MS)  06/10/2023   IR TRANSCATH/EMBOLIZ  06/10/2023   RADIOLOGY WITH ANESTHESIA N/A 06/10/2023   Procedure: IR WITH ANESTHESIA;  Surgeon: Lisbeth Renshaw, MD;  Location: [facility] OR;  Service: Radiology;  Laterality: N/A;   Assessment / Plan / Recommendation Clinical Impression An 89 year old with past medical history significant for asthma, dementia, essential hypertension presented to the ED with sudden onset of severe headache, nausea vomiting.  Associated with right-sided weakness, photophobia and facial droop.  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  Neurosurgery consulted and PCCM asked to admit patient to the ICU.  Patient underwent coiling of the left MCA aneurysm by Dr. Conchita Paris.  Patient also required intubation. She developed stridor after  extubation which was treated with racemic epinephrine and Decadron. Respiratory status improved.  She was transferred to progressive care floor. Patient has been able to tolerate diet. Core track tube removed. PT/OT/SLP evaluations completed with recommendations for acute inpatient rehab admission.   Cognitive Linguistic Evaluation: Patient presents with significant cognitive deficits in the areas of memory, attention, orientation, and awareness of deficits with patient exhibiting difficulty participating in full formal evaluation d/t pain and internal distracters. Suspect cognitive deficits at baseline. Patient lives with son with other children nearby and son is responsible for keeping track of daily events at baseline. With assistance of Nepali interpreter, SLP attempted to administer SLUMS evaluation with patient completing first 5 items. Across 5 items, patient scored 0. SLP provided patient with calendar containing month and year in [place] and in [place]. Placed visual aid on wall. Patient reported headache pain during evaluation and requested to rest, thus cognitive portion of evaluation was truncated. Patient will benefit from therapy services to address cognitive changes during CIR admission.   Swallowing: SLP conducted bedside swallow evaluation with patient demonstrating tolerance of Dys3/thin liquid textures across all trials. No overt s/sx of penetration/aspiration observed throughout evaluation. During OME, patient exhibited reduced right sided ROM and strength, although patient able to clear oral cavity of Dys3 textures given liquid wash. Recommend continuation of Dys3/thin liquid diet with pills administered whole with thin liquids or puree per patient preference. Recommend intermittent supervision to assist patient with reminders for liquid wash. SLP will continue to monitor during inpatient stay and upgrade as appropriate.    Skilled Therapeutic Interventions  SLP conducted skilled  dysphagia and cognitive evaluation utilizing bedside swallow evaluation, beginning questions of SLUMS examination, case history interview, and informal measures.    SLP Assessment  Patient will need skilled Speech Lanaguage Pathology Services during CIR admission    Recommendations  SLP Diet Recommendations: Age appropriate regular solids;Thin Liquid Administration via: Cup;Straw Medication Administration: Whole meds with liquid Supervision: Trained caregiver to feed patient;Intermittent supervision to cue for compensatory strategies Compensations: Follow solids with liquid Postural Changes and/or Swallow Maneuvers: Seated upright 90 degrees Oral Care Recommendations: Oral care BID Recommendations for Other Services: Neuropsych consult Patient destination: Home Follow up Recommendations: Home Health SLP Equipment Recommended: None recommended by SLP    SLP Frequency 3 to 5 out of 7 days   SLP Duration  SLP Intensity  SLP Treatment/Interventions 12-14  Minumum of 1-2 x/day, 30 to 90 minutes  Cognitive remediation/compensation;Environmental controls;Cueing hierarchy;Functional tasks;Therapeutic Activities;Internal/external aids;Dysphagia/aspiration precaution training;Medication managment;Patient/family education    Pain Pain Assessment Pain Scale: 0-10 Pain Score: 6  Faces Pain Scale: Hurts little more Pain Type: Acute pain Pain Location: Head Pain Orientation: Right Pain Descriptors / Indicators: Headache Pain Frequency: Constant Pain Onset: On-going Patients Stated Pain Goal: 0 Pain Intervention(s): Medication (See eMAR) Multiple Pain Sites: No  Prior Functioning Cognitive/Linguistic Baseline: Baseline deficits Baseline deficit details: specific deficits unavailable Type of Home: House  Lives With: Son Available Help at Discharge: Family;Available 24 hours/day Vocation: Retired  Architectural technologist Overall Cognitive Status: Impaired/Different from  baseline Arousal/Alertness: Awake/alert Orientation Level: Oriented to person Year: Other (Comment) ("I don't know") Month:  (Unable to state) Day of Week: Incorrect Attention: Focused Focused Attention: Impaired Focused Attention Impairment: Verbal basic Memory: Impaired Memory Impairment: Storage deficit;Retrieval deficit;Decreased recall of new information;Decreased short term memory Decreased Short Term Memory: Verbal basic Awareness: Impaired Awareness Impairment: Intellectual impairment Problem Solving: Impaired Problem Solving Impairment: Verbal basic;Functional basic Behaviors: Restless Safety/Judgment: Impaired  Comprehension Auditory Comprehension Overall Auditory Comprehension: Appears within functional limits for tasks assessed Yes/No Questions: Not tested Commands: Not tested One Step Basic Commands: Not tested Interfering Components: Attention;Pain EffectiveTechniques: Extra processing time Visual Recognition/Discrimination Discrimination: Not tested Reading Comprehension Reading Status: Not tested Expression Expression Primary Mode of Expression: Verbal Verbal Expression Overall Verbal Expression: Appears within functional limits for tasks assessed Oral Motor Oral Motor/Sensory Function Overall Oral Motor/Sensory Function: Mild impairment Facial ROM: Reduced right Facial Symmetry: Abnormal symmetry right Facial Strength: Reduced right Lingual ROM: Within Functional Limits Lingual Symmetry: Within Functional Limits Lingual Strength: Within Functional Limits Velum: Within Functional Limits Mandible: Within Functional Limits Motor Speech Overall Motor Speech: Appears within functional limits for tasks assessed  Care Tool Care Tool Cognition Ability to hear (with hearing aid or hearing appliances if normally used Ability to hear (with hearing aid or hearing appliances if normally used): 1. Minimal difficulty - difficulty in some environments (e.g. when  person speaks softly or setting is noisy)   Expression of Ideas and Wants Expression of Ideas and Wants: 3. Some difficulty - exhibits some difficulty with expressing needs and ideas (e.g, some words or finishing thoughts) or speech is not clear   Understanding Verbal and Non-Verbal Content Understanding Verbal and Non-Verbal Content: 3. Usually understands - understands most conversations, but misses some part/intent of message. Requires cues at times to understand  Memory/Recall Ability Memory/Recall Ability : That he or she is in a hospital/hospital unit   Bedside Swallowing Assessment General Date of Onset: 06/15/23 Diet Prior to this Study: Dysphagia 3 (mechanical soft);Thin liquids (Level 0) Temperature  Spikes Noted: No Respiratory Status: Room air History of Recent Intubation: Yes Total duration of intubation (days): 2 days Date extubated: 06/14/23 Behavior/Cognition: Alert;Cooperative Oral Cavity - Dentition: Dentures, bottom;Dentures, top Self-Feeding Abilities: Needs assist Vision: Functional for self-feeding Patient Positioning: Upright in bed Baseline Vocal Quality: Normal Volitional Cough: Weak Volitional Swallow: Able to elicit  Oral Care Assessment Oral Assessment  (WDL): Exceptions to WDL Lips: Symmetrical Teeth: Missing (Comment) Tongue: Moist Mucous Membrane(s): Pink Saliva: Moist, saliva free flowing Level of Consciousness: Alert Is patient on any of following O2 devices?: None of the above Nutritional status: Dysphagia Oral Assessment Risk : High Risk Ice Chips Ice chips: Not tested Thin Liquid Thin Liquid: Within functional limits Presentation: Cup;Spoon;Straw Nectar Thick Nectar Thick Liquid: Not tested Honey Thick Honey Thick Liquid: Not tested Puree Puree: Not tested Solid Solid: Impaired Presentation: Self Fed Oral Phase Functional Implications: Prolonged oral transit Other Comments: benefited from liquid wash BSE Assessment Risk for  Aspiration Impact on safety and function: Mild aspiration risk Other Related Risk Factors: Cognitive impairment;Prolonged intubation  Short Term Goals: Week 1: SLP Short Term Goal 1 (Week 1): Patient will orient to time, place, and situation given modA for use of available room aids. SLP Short Term Goal 2 (Week 1): Patient will verbalize 2 cognitive and 2 physical deficits given modA. SLP Short Term Goal 3 (Week 1): Patient will utilize environmental aids to recall basic daily events given modA. SLP Short Term Goal 4 (Week 1): Patient will tolerate least restrictive diet utilizing recommended swallowing safety strategies given minA.  Refer to Care Plan for Long Term Goals  Recommendations for other services: Neuropsych  Discharge Criteria: Patient will be discharged from SLP if patient refuses treatment 3 consecutive times without medical reason, if treatment goals not met, if there is a change in medical status, if patient makes no progress towards goals or if patient is discharged from hospital.  The above assessment, treatment plan, treatment alternatives and goals were discussed and mutually agreed upon: by patient  Jeannie Done, M.A., CCC-SLP  Yetta Barre 06/28/2023, 12:38 PM

## 2023-06-28 NOTE — Progress Notes (Signed)
Inpatient Rehabilitation Care Coordinator Assessment and Plan Patient Details  Name: Yolanda [name] MRN: [id number] Date of Birth: 09-12-1933  Today's Date: 06/28/2023  Hospital Problems: Principal Problem:   Subarachnoid hemorrhage [facility])  Past Medical History:  Past Medical History:  Diagnosis Date   Closed fracture of left proximal humerus 03/27/2021   Closed fracture of lower end of left radius with routine healing 10/31/2020   Dementia (HCC)    Mild intermittent asthma without complication 04/04/2019   OSTEOARTHRITIS 08/04/2010   Annotation: Bilateral knees and ankles Qualifier: Diagnosis of  By: Delrae Alfred MD, Lanora Manis     Past Surgical History:  Past Surgical History:  Procedure Laterality Date   IR 3D INDEPENDENT WKST  06/10/2023   IR ANGIO INTRA EXTRACRAN SEL INTERNAL CAROTID UNI L MOD SED  06/11/2023   IR ANGIOGRAM FOLLOW UP STUDY  06/10/2023   IR ANGIOGRAM FOLLOW UP STUDY  06/10/2023   IR NEURO EACH ADD'L AFTER BASIC UNI LEFT (MS)  06/10/2023   IR TRANSCATH/EMBOLIZ  06/10/2023   RADIOLOGY WITH ANESTHESIA N/A 06/10/2023   Procedure: IR WITH ANESTHESIA;  Surgeon: Lisbeth Renshaw, MD;  Location: [facility] OR;  Service: Radiology;  Laterality: N/A;   Social History:  reports that she has never smoked. She does not have any smokeless tobacco history on file. She reports that she does not drink alcohol and does not use drugs.  Family / Support Systems Marital Status: Widow/Widower Patient Roles: Parent, Other (Comment) (grandmother) Children: Jackquline [place] [phone] Other Supports: Bal-grandson [phone]  Daughter in-law and other family members   PCS aide 4-5 hours per day via Film/video editor Anticipated Caregiver: DIL, son, caregiver, family Ability/Limitations of Caregiver: No limitations Caregiver Availability: 24/7 Family Dynamics: Close knit with family and community she knows the interpreter here, which she likes. her fmaily will provide the support she needs at discharge  Social  History Preferred language: Nepali Religion: Other Cultural Background: pt is from [place] and speaks nepali Education: Some schooling in [place] Health Literacy - How often do you need to have someone help you when you read instructions, pamphlets, or other written material from your doctor or pharmacy?: Always Writes: Yes (native language) Employment Status: Retired Marine scientist Issues: No issues Guardian/Conservator: None-according to MD pt is not fully capable of making her own decisions. Will look toward her son although no formal POA/HCPOA in place   Abuse/Neglect Abuse/Neglect Assessment Can Be Completed: Unable to assess, patient is non-responsive or altered mental status  Patient response to: Social Isolation - How often do you feel lonely or isolated from those around you?: Patient unable to respond  Emotional Status Pt's affect, behavior and adjustment status: Pt is aware she is in a hospital and now on rehab. Son voiced she was mobile and could do ADL"S needed supervision due to her dementia. Family is very close and have been staying with pt so she does not become depressed. Son feels she will wil better on rehab due to has tasks to do and out of the room Recent Psychosocial Issues: other health issues Psychiatric History: no history-has dementia Substance Abuse History: No issues  Patient / Family Perceptions, Expectations & Goals Pt/Family understanding of illness & functional limitations: Pt seems to have a basic idea in the hospital, but son here daily and talks with the MD and feels he understands her treatment plan moving forward. He will try to go back and forth now she is on rehab. Premorbid pt/family roles/activities: mom, grandmother, retiree, church member, etc Anticipated changes in  roles/activities/participation: resume Pt/family expectations/goals: Son states: " I hope she does well and can get back to moving like before."  [employer]: Other (Comment) (PCS aide via [facility] 4-5 hours per day) Premorbid Home Care/DME Agencies: Other (Comment) (cane) Transportation available at discharge: family Is the patient able to respond to transportation needs?: Other (comment) (son reported) In the past 12 months, has lack of transportation kept you from medical appointments or from getting medications?: No In the past 12 months, has lack of transportation kept you from meetings, work, or from getting things needed for daily living?: No  Discharge Planning Living Arrangements: Children, Other relatives Support Systems: Children, Other relatives, Friends/neighbors, Home care staff, Church/faith community Type of Residence: Private residence Insurance Resources: Medicare, [employer] (specify county) Surveyor, quantity Resources: Family Support Financial Screen Referred: No Living Expenses: Lives with family Money Management: Family Does the patient have any problems obtaining your medications?: No Home Management: family Patient/Family Preliminary Plans: Return home with son and his family along with aide. They are aware she will need 24/7 care at discharge. Aware being evaluated today and goals being set for stay here. Care Coordinator Anticipated Follow Up Needs: HH/OP  Clinical Impression Pleasant female who is willing to work in therapies to improve. Her family is very involved and supportive and will provide care at discharge. She does need an interpreter while here since speaks nepali. Daughter in-law to get her PCP name so can be placed in the chart. Will work on discharge needs and await therapy team goals.  Lucy Chris 06/28/2023, 12:09 PM

## 2023-06-28 NOTE — Plan of Care (Signed)
  Problem: RH Swallowing Goal: LTG Patient will consume least restrictive diet using compensatory strategies with assistance (SLP) Description: LTG:  Patient will consume least restrictive diet using compensatory strategies with assistance (SLP) Flowsheets (Taken 06/28/2023 1302) LTG: Pt Patient will consume least restrictive diet using compensatory strategies with assistance of (SLP): Modified Independent   Problem: RH Cognition - SLP Goal: RH LTG Patient will demonstrate orientation with cues Description:  LTG:  Patient will demonstrate orientation to person/place/time/situation with cues (SLP)   Flowsheets (Taken 06/28/2023 1302) LTG Patient will demonstrate orientation to:  Place  Time  Situation LTG: Patient will demonstrate orientation using cueing (SLP): Minimal Assistance - Patient > 75%   Problem: RH Memory Goal: LTG Patient will demonstrate ability for day to day (SLP) Description: LTG:   Patient will demonstrate ability for day to day recall/carryover during cognitive/linguistic activities with assist  (SLP) Flowsheets (Taken 06/28/2023 1302) LTG: Patient will demonstrate ability for day to day recall: New information LTG: Patient will demonstrate ability for day to day recall/carryover during cognitive/linguistic activities with assist (SLP): Minimal Assistance - Patient > 75%   Problem: RH Awareness Goal: LTG: Patient will demonstrate awareness during functional activites type of (SLP) Description: LTG: Patient will demonstrate awareness during functional activites type of (SLP) Flowsheets (Taken 06/28/2023 1302) Patient will demonstrate during cognitive/linguistic activities awareness type of: Intellectual LTG: Patient will demonstrate awareness during cognitive/linguistic activities with assistance of (SLP): Minimal Assistance - Patient > 75%

## 2023-06-29 DIAGNOSIS — R519 Headache, unspecified: Secondary | ICD-10-CM

## 2023-06-29 MED ORDER — BUTALBITAL-APAP-CAFFEINE 50-325-40 MG PO TABS
1.0000 | ORAL_TABLET | Freq: Four times a day (QID) | ORAL | Status: DC | PRN
  Administered 2023-06-29 – 2023-07-05 (×17): 1 via ORAL
  Filled 2023-06-29 (×19): qty 1

## 2023-06-29 MED ORDER — TOPIRAMATE 25 MG PO TABS
25.0000 mg | ORAL_TABLET | Freq: Once | ORAL | Status: AC
  Administered 2023-06-29: 25 mg via ORAL
  Filled 2023-06-29: qty 1

## 2023-06-29 NOTE — Progress Notes (Addendum)
PROGRESS NOTE   Subjective/Complaints: She is sitting in chair, son in the room today.  Son reports he is happy with her progress with therapy.  She continues to have episodes of headache.   ROS: +headache, Denies CP/SOB/Fever  Objective:   VAS [place] LOWER EXTREMITY VENOUS (DVT)  Result Date: 06/28/2023  Lower Venous DVT Study Patient Name:  Yolanda [name]  Date of Exam:   06/28/2023 Medical Rec #: [id number]     Accession #:    [id number] Date of Birth: 11-Nov-1932     Patient Gender: F Patient Age:   89 years Exam Location:  [facility] Procedure:      VAS [place] LOWER EXTREMITY VENOUS (DVT) Referring Phys: Wendi Maya --------------------------------------------------------------------------------  Indications: "edema". Other Indications: Rehab patient. Limitations: Difficulty imaging left lower extremity due to patient lying on right side. Comparison Study: No previous exams Performing Technologist: Jody Hill RVT, RDMS  Examination Guidelines: A complete evaluation includes B-mode imaging, spectral Doppler, color Doppler, and power Doppler as needed of all accessible portions of each vessel. Bilateral testing is considered an integral part of a complete examination. Limited examinations for reoccurring indications may be performed as noted. The reflux portion of the exam is performed with the patient in reverse Trendelenburg.  +---------+---------------+---------+-----------+----------+--------------+ RIGHT    CompressibilityPhasicitySpontaneityPropertiesThrombus Aging +---------+---------------+---------+-----------+----------+--------------+ CFV      Full           Yes      Yes                                 +---------+---------------+---------+-----------+----------+--------------+ SFJ      Full                                                        +---------+---------------+---------+-----------+----------+--------------+  FV Prox  Full           Yes      Yes                                 +---------+---------------+---------+-----------+----------+--------------+ FV Mid   Full           Yes      Yes                                 +---------+---------------+---------+-----------+----------+--------------+ FV DistalFull           Yes      Yes                                 +---------+---------------+---------+-----------+----------+--------------+ PFV      Full                                                        +---------+---------------+---------+-----------+----------+--------------+  POP      Full           Yes      Yes                                 +---------+---------------+---------+-----------+----------+--------------+ PTV      Full                                                        +---------+---------------+---------+-----------+----------+--------------+ PERO     Full                                                        +---------+---------------+---------+-----------+----------+--------------+   +--------+---------------+---------+-----------+----------+--------------------+ LEFT    CompressibilityPhasicitySpontaneityPropertiesThrombus Aging       +--------+---------------+---------+-----------+----------+--------------------+ CFV     Full           Yes      Yes                                       +--------+---------------+---------+-----------+----------+--------------------+ SFJ     Full                                                              +--------+---------------+---------+-----------+----------+--------------------+ FV Prox Full           Yes      Yes                                       +--------+---------------+---------+-----------+----------+--------------------+ FV Mid  Full           Yes      Yes                                        +--------+---------------+---------+-----------+----------+--------------------+ FV      Full           Yes      Yes                                       Distal                                                                    +--------+---------------+---------+-----------+----------+--------------------+ PFV                    Yes  Yes                  patent by                                                                 color/doppler        +--------+---------------+---------+-----------+----------+--------------------+ POP     Full           Yes      Yes                                       +--------+---------------+---------+-----------+----------+--------------------+ PTV     Full                                                              +--------+---------------+---------+-----------+----------+--------------------+ PERO    Full                                                              +--------+---------------+---------+-----------+----------+--------------------+     Summary: BILATERAL: - No evidence of deep vein thrombosis seen in the lower extremities, bilaterally. -No evidence of popliteal cyst, bilaterally.   *See table(s) above for measurements and observations.    Preliminary    Recent Labs    06/27/23 1608 06/28/23 0622  WBC 10.9* 9.6  HGB 10.3* 11.1*  HCT 32.6* 34.7*  PLT 361 342   Recent Labs    06/27/23 1608 06/28/23 0622  NA  --  135  K  --  4.3  CL  --  101  CO2  --  25  GLUCOSE  --  109*  BUN  --  12  CREATININE 0.60 0.61  CALCIUM  --  8.7*    Intake/Output Summary (Last 24 hours) at 06/29/2023 1455 Last data filed at 06/29/2023 1400 Gross per 24 hour  Intake 460 ml  Output --  Net 460 ml        Physical Exam: Vital Signs Blood pressure (!) 103/42, pulse 62, temperature 97.6 F (36.4 C), temperature source Oral, resp. rate 16, weight 55.5 kg, SpO2 98%. Gen: no distress, normal appearing HEENT:  oral mucosa pink and moist, NCAT Cardio: RRR Chest: CTAB, normal effort, normal rate of breathing Abd: soft, non-distended, +BS Ext: no edema Psych: pleasant, normal affect Skin: Clean and intact without signs of breakdown Small area of redness on right wrist-much smaller than area outlined with marker family reports this has been improving Neuro: Alert and awake Left upper extremity strength grossly 4 out of 5 Right upper extremity strength grossly 4- out of 5 Bilateral lower extremity strength grossly 4 out of 5 MMT limited by difficulty following commands Sensation intact to light touch in all 4 extremities Musculoskeletal:  No joint swelling or tenderness noted No abnormal tone appreciated  Assessment/Plan: 1. Functional deficits which require 3+ hours per day of interdisciplinary therapy in a comprehensive inpatient rehab setting. Physiatrist is providing close team supervision and 24 hour management of active medical problems listed below. Physiatrist and rehab team continue to assess barriers to discharge/monitor patient progress toward functional and medical goals  Care Tool:  Bathing    Body parts bathed by patient: Chest, Face, Right upper leg, Left upper leg   Body parts bathed by helper: Right arm, Left arm, Left lower leg, Right lower leg, Front perineal area, Buttocks     Bathing assist Assist Level: Moderate Assistance - Patient 50 - 74%     Upper Body Dressing/Undressing Upper body dressing   What is the patient wearing?: Dress    Upper body assist Assist Level: Moderate Assistance - Patient 50 - 74%    Lower Body Dressing/Undressing Lower body dressing      What is the patient wearing?: Incontinence brief     Lower body assist Assist for lower body dressing: Dependent - Patient 0%     Toileting Toileting Toileting Activity did not occur (Clothing management and hygiene only): N/A (no void or bm)  Toileting assist        Transfers Chair/bed transfer  Transfers assist     Chair/bed transfer assist level: Moderate Assistance - Patient 50 - 74%     Locomotion Ambulation   Ambulation assist      Assist level: 2 helpers (mod A with w/c follow) Assistive device: Walker-rolling Max distance: 13 ft   Walk 10 feet activity   Assist     Assist level: Moderate Assistance - Patient - 50 - 74% Assistive device: Walker-rolling   Walk 50 feet activity   Assist Walk 50 feet with 2 turns activity did not occur: Safety/medical concerns         Walk 150 feet activity   Assist Walk 150 feet activity did not occur: Safety/medical concerns         Walk 10 feet on uneven surface  activity   Assist Walk 10 feet on uneven surfaces activity did not occur: Safety/medical concerns         Wheelchair     Assist Is the patient using a wheelchair?: Yes Type of Wheelchair: Manual    Wheelchair assist level: Dependent - Patient 0% Max wheelchair distance: 150 ft    Wheelchair 50 feet with 2 turns activity    Assist        Assist Level: Dependent - Patient 0%   Wheelchair 150 feet activity     Assist      Assist Level: Dependent - Patient 0%   Blood pressure (!) 103/42, pulse 62, temperature 97.6 F (36.4 C), temperature source Oral, resp. rate 16, weight 55.5 kg, SpO2 98%.  Medical Problem List and Plan: 1. 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  Antithrombotics: -DVT/anticoagulation:  Pharmaceutical: Heparin             -antiplatelet therapy: Aspirin and Plavix for three weeks followed by aspirin alone   3. Headaches: topamax ordered HS. Tylenol as needed  -9/7 she did not get Topamax last night however  she was given 25 mg earlier this morning  for headache.  Will start Fioricet as needed.  If headache worsens could consider CT scan of the head   4. Mood/Behavior/Sleep: LCSW to evaluate and provide emotional support             -continue Rozerem 8 mg q HS             -continue Zoloft 25 mg daily             -continue trazodone 100 mg q HS             -antipsychotic agents: n/a   5. Neuropsych/cognition: This patient is not capable of making decisions on her own behalf.   6. Skin/Wound Care: Routine skin care checks   7. Fluids/Electrolytes/Nutrition: Routine Is and Os and follow-up chemistries             -vegetarian diet/dysphagia 3; continue Ensure             -Dysphagia- SLP eval, monitor p.o. intake   8: Hypertension: monitor TID and prn             -continue bisoprolol 5 mg daily  -9/7 BP controlled overall, continue current regimen and monitor trend  9: Hyperlipidemia: continue statin   10: SAH: continue nimodipine 60 mg q 4 hours through 9/09   11: Urinary retention: continue Flomax             -observe voiding and consider d/c  -9/7 Has been continent, recent bladder scan is not particularly elevated-sinew to monitor for now     12: Loose stool: started on Florastor   13: Aspiration pneumonia: abx completed; history of asthma.              -continue Brovana and Pulmicort nebulizers BID             -consider MDI in place of nebs             -FV/IS   14: Prediabetes: A1c = 5.8%             -CBGs improving off TF and steroids (check BID), provided list of foods for improved blood sugar control   15: Leukocytosis: improving; steroids may have been contributing; Unasyn completed 9/05; remains afebrile             -follow-up CBC with diff   16: Acute anemia of critical illness: discussed that hemoglobin is improving  17: Acute diastolic CHF/garde 1 diastolic dysfunction/pulm edema/elevated BNP             -received Lasix, EF =  70-75%             -daily weight, monitor for signs of fluid overload  -9/7 no  signs of fluid overload noted, continue to monitor trend of weights  Filed Weights   06/27/23 1430 06/29/23 0701  Weight: 57.1 kg 55.5 kg      18.  Acute delirium.  Improving.  Continue trazodone 100 mg at bedtime.  19. Overweight: encouraged avoiding added sugars in drinks  20. Hypoalbuminemia: encouraged whole food sources of protein   LOS: 2 days A FACE TO FACE EVALUATION WAS PERFORMED  Fanny Dance 06/29/2023, 2:55 PM

## 2023-06-30 MED ORDER — GABAPENTIN 100 MG PO CAPS
100.0000 mg | ORAL_CAPSULE | Freq: Three times a day (TID) | ORAL | Status: DC
  Administered 2023-06-30 – 2023-07-05 (×14): 100 mg via ORAL
  Filled 2023-06-30 (×15): qty 1

## 2023-06-30 MED ORDER — TOPIRAMATE 25 MG PO TABS: 37.5000 mg | ORAL_TABLET | Freq: Every day | ORAL | Status: DC

## 2023-06-30 MED ORDER — TOPIRAMATE 25 MG PO TABS
25.0000 mg | ORAL_TABLET | Freq: Every day | ORAL | Status: DC
  Administered 2023-06-30: 25 mg via ORAL
  Filled 2023-06-30: qty 1

## 2023-06-30 NOTE — Progress Notes (Addendum)
PROGRESS NOTE   Subjective/Complaints: Patient reports her headache is a little better today.  No additional concerns.  ROS: +headache, Denies CP/SOB/Fever/chills/new motor or sensory changes  Objective:   VAS [place] LOWER EXTREMITY VENOUS (DVT)  Result Date: 06/30/2023  Lower Venous DVT Study Patient Name:  Yolanda [name]  Date of Exam:   06/28/2023 Medical Rec #: [id number]     Accession #:    [id number] Date of Birth: 03/13/1933     Patient Gender: F Patient Age:   89 years Exam Location:  [facility] Procedure:      VAS [place] LOWER EXTREMITY VENOUS (DVT) Referring Phys: Wendi Maya --------------------------------------------------------------------------------  Indications: "edema". Other Indications: Rehab patient. Limitations: Difficulty imaging left lower extremity due to patient lying on right side. Comparison Study: No previous exams Performing Technologist: Jody Hill RVT, RDMS  Examination Guidelines: A complete evaluation includes B-mode imaging, spectral Doppler, color Doppler, and power Doppler as needed of all accessible portions of each vessel. Bilateral testing is considered an integral part of a complete examination. Limited examinations for reoccurring indications may be performed as noted. The reflux portion of the exam is performed with the patient in reverse Trendelenburg.  +---------+---------------+---------+-----------+----------+--------------+ RIGHT    CompressibilityPhasicitySpontaneityPropertiesThrombus Aging +---------+---------------+---------+-----------+----------+--------------+ CFV      Full           Yes      Yes                                 +---------+---------------+---------+-----------+----------+--------------+ SFJ      Full                                                        +---------+---------------+---------+-----------+----------+--------------+ FV Prox  Full           Yes       Yes                                 +---------+---------------+---------+-----------+----------+--------------+ FV Mid   Full           Yes      Yes                                 +---------+---------------+---------+-----------+----------+--------------+ FV DistalFull           Yes      Yes                                 +---------+---------------+---------+-----------+----------+--------------+ PFV      Full                                                        +---------+---------------+---------+-----------+----------+--------------+  POP      Full           Yes      Yes                                 +---------+---------------+---------+-----------+----------+--------------+ PTV      Full                                                        +---------+---------------+---------+-----------+----------+--------------+ PERO     Full                                                        +---------+---------------+---------+-----------+----------+--------------+   +--------+---------------+---------+-----------+----------+--------------------+ LEFT    CompressibilityPhasicitySpontaneityPropertiesThrombus Aging       +--------+---------------+---------+-----------+----------+--------------------+ CFV     Full           Yes      Yes                                       +--------+---------------+---------+-----------+----------+--------------------+ SFJ     Full                                                              +--------+---------------+---------+-----------+----------+--------------------+ FV Prox Full           Yes      Yes                                       +--------+---------------+---------+-----------+----------+--------------------+ FV Mid  Full           Yes      Yes                                       +--------+---------------+---------+-----------+----------+--------------------+ FV      Full            Yes      Yes                                       Distal                                                                    +--------+---------------+---------+-----------+----------+--------------------+ PFV                    Yes  Yes                  patent by                                                                 color/doppler        +--------+---------------+---------+-----------+----------+--------------------+ POP     Full           Yes      Yes                                       +--------+---------------+---------+-----------+----------+--------------------+ PTV     Full                                                              +--------+---------------+---------+-----------+----------+--------------------+ PERO    Full                                                              +--------+---------------+---------+-----------+----------+--------------------+     Summary: BILATERAL: - No evidence of deep vein thrombosis seen in the lower extremities, bilaterally. -No evidence of popliteal cyst, bilaterally.   *See table(s) above for measurements and observations. Electronically signed by Heath Lark on 06/30/2023 at 11:13:00 AM.    Final    Recent Labs    06/27/23 1608 06/28/23 0622  WBC 10.9* 9.6  HGB 10.3* 11.1*  HCT 32.6* 34.7*  PLT 361 342   Recent Labs    06/27/23 1608 06/28/23 0622  NA  --  135  K  --  4.3  CL  --  101  CO2  --  25  GLUCOSE  --  109*  BUN  --  12  CREATININE 0.60 0.61  CALCIUM  --  8.7*    Intake/Output Summary (Last 24 hours) at 06/30/2023 1514 Last data filed at 06/30/2023 0800 Gross per 24 hour  Intake 480 ml  Output --  Net 480 ml        Physical Exam: Vital Signs Blood pressure (!) 126/55, pulse 71, temperature 98.8 F (37.1 C), temperature source Oral, resp. rate 17, weight 55.3 kg, SpO2 100%. Gen: no distress, normal appearing, HEENT: oral mucosa pink and moist, NCAT Cardio:  RRR Chest: CTAB, normal effort, normal rate of breathing Abd: s soft, nontender, nondistended, normal active bowel sounds Ext: no edema Psych: pleasant, normal affect Skin: Clean and intact without signs of breakdown Small area of redness on right wrist-improved Neuro: Alert and awake Left upper extremity strength grossly 4 out of 5 Right upper extremity strength grossly 4- out of 5 Bilateral lower extremity strength grossly 4 out of 5 MMT limited by difficulty following commands Sensation intact to light touch in all 4 extremities Musculoskeletal:  No joint swelling or tenderness noted No abnormal tone appreciated  Assessment/Plan: 1. Functional deficits which require 3+ hours per day of interdisciplinary therapy in a comprehensive inpatient rehab setting. Physiatrist is providing close team supervision and 24 hour management of active medical problems listed below. Physiatrist and rehab team continue to assess barriers to discharge/monitor patient progress toward functional and medical goals  Care Tool:  Bathing    Body parts bathed by patient: Right arm, Left arm, Chest, Abdomen, Front perineal area, Right upper leg, Left upper leg, Face   Body parts bathed by helper: Right arm, Left arm, Left lower leg, Right lower leg, Front perineal area, Buttocks     Bathing assist Assist Level: Moderate Assistance - Patient 50 - 74%     Upper Body Dressing/Undressing Upper body dressing   What is the patient wearing?: Dress    Upper body assist Assist Level: Minimal Assistance - Patient > 75%    Lower Body Dressing/Undressing Lower body dressing      What is the patient wearing?: Incontinence brief     Lower body assist Assist for lower body dressing: Dependent - Patient 0%     Toileting Toileting Toileting Activity did not occur (Clothing management and hygiene only): N/A (no void or bm)  Toileting assist Assist for toileting: Moderate Assistance - Patient 50 - 74%      Transfers Chair/bed transfer  Transfers assist     Chair/bed transfer assist level: Minimal Assistance - Patient > 75%     Locomotion Ambulation   Ambulation assist      Assist level: 2 helpers (mod A with w/c follow) Assistive device: Walker-rolling Max distance: 13 ft   Walk 10 feet activity   Assist     Assist level: Moderate Assistance - Patient - 50 - 74% Assistive device: Walker-rolling   Walk 50 feet activity   Assist Walk 50 feet with 2 turns activity did not occur: Safety/medical concerns         Walk 150 feet activity   Assist Walk 150 feet activity did not occur: Safety/medical concerns         Walk 10 feet on uneven surface  activity   Assist Walk 10 feet on uneven surfaces activity did not occur: Safety/medical concerns         Wheelchair     Assist Is the patient using a wheelchair?: Yes Type of Wheelchair: Manual    Wheelchair assist level: Dependent - Patient 0% Max wheelchair distance: 150 ft    Wheelchair 50 feet with 2 turns activity    Assist        Assist Level: Dependent - Patient 0%   Wheelchair 150 feet activity     Assist      Assist Level: Dependent - Patient 0%   Blood pressure (!) 126/55, pulse 71, temperature 98.8 F (37.1 C), temperature source Oral, resp. rate 17, weight 55.3 kg, SpO2 100%.  Medical Problem List and Plan: 1. 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  Antithrombotics: -DVT/anticoagulation:  Pharmaceutical: Heparin             -antiplatelet therapy: Aspirin and Plavix for three weeks followed by aspirin alone   3. Headaches: topamax ordered  HS. Tylenol as needed  -9/7 she did not get Topamax last night however she was given 25 mg earlier this morning  for headache.  Will start Fioricet as needed.  If headache worsens could consider CT scan of the head  -9/8 headache a little better today but continues, will add gabapentin 100mg  TID   4. Mood/Behavior/Sleep: LCSW to evaluate and provide emotional support             -continue Rozerem 8 mg q HS             -continue Zoloft 25 mg daily             -continue trazodone 100 mg q HS             -antipsychotic agents: n/a   5. Neuropsych/cognition: This patient is not capable of making decisions on her own behalf.   6. Skin/Wound Care: Routine skin care checks   7. Fluids/Electrolytes/Nutrition: Routine Is and Os and follow-up chemistries             -vegetarian diet/dysphagia 3; continue Ensure             -Dysphagia- SLP eval, monitor p.o. intake   8: Hypertension: monitor TID and prn             -continue bisoprolol 5 mg daily  -9/8 blood pressure controlled, continue current regimen      06/30/2023    1:06 PM 06/30/2023    8:43 AM 06/30/2023    4:43 AM  Vitals with BMI  Systolic 126  130  Diastolic 55  54  Pulse 71 60 59    9: Hyperlipidemia: continue statin   10: SAH: continue nimodipine 60 mg q 4 hours through 9/09   11: Urinary retention: continue Flomax             -observe voiding and consider d/c  -9/7-8 Has been continent, recent bladder scan is not particularly elevated-continue to monitor     12: Loose stool: started on Florastor   13: Aspiration pneumonia: abx completed; history of asthma.              -continue Brovana and Pulmicort nebulizers BID             -consider MDI in place of nebs             -FV/IS   14: Prediabetes: A1c = 5.8%             -CBGs improving off TF and steroids (check BID), provided list of foods for improved blood sugar control   15: Leukocytosis: improving; steroids may have been contributing; Unasyn completed 9/05; remains afebrile             -9/8 improved on last CBC to 9.6, recheck tomorrow   16: Acute anemia of critical  illness: discussed that hemoglobin is improving  17: Acute diastolic CHF/garde 1 diastolic dysfunction/pulm edema/elevated BNP             -received Lasix, EF =  70-75%             -daily weight, monitor for signs of fluid overload  -9/7-8 no signs of fluid overload noted, continue to monitor trend of weights  Filed Weights   06/27/23 1430 06/29/23 0701 06/30/23 0326  Weight: 57.1 kg 55.5 kg 55.3 kg      18.  Acute delirium.  Improving.  Continue trazodone 100 mg at bedtime.  19. Overweight: encouraged avoiding added  sugars in drinks  20. Hypoalbuminemia: encouraged whole food sources of protein   LOS: 3 days A FACE TO FACE EVALUATION WAS PERFORMED  Fanny Dance 06/30/2023, 3:14 PM

## 2023-06-30 NOTE — Progress Notes (Signed)
Speech Language Pathology Daily Session Note  Patient Details  Name: Yolanda [name] MRN: [id number] Date of Birth: 1933/01/31  Today's Date: 06/30/2023 SLP Individual Time: 7829-5621 SLP Individual Time Calculation (min): 55 min  Short Term Goals: Week 1: SLP Short Term Goal 1 (Week 1): Patient will orient to time, place, and situation given modA for use of available room aids. SLP Short Term Goal 2 (Week 1): Patient will verbalize 2 cognitive and 2 physical deficits given modA. SLP Short Term Goal 3 (Week 1): Patient will utilize environmental aids to recall basic daily events given modA. SLP Short Term Goal 4 (Week 1): Patient will tolerate least restrictive diet utilizing recommended swallowing safety strategies given minA.  Skilled Therapeutic Interventions:  Pt was seen for skilled ST targeting cognitive goals.  Pt was sleeping upon arrival but awakened easily to voice.  Son was initially present to act as interpreter but pt's neighbor and scheduled interpreter arrived shortly after session began.  Pt was oriented to place and date (month and day of the week) independently but benefited from choice of two to reorient to situation.  Pt requested to use the bathroom and needed min cues for safety and task sequencing during toileting tasks on bedside commode.  SLP facilitated the session with a basic peg board task to address sustained attention.  Pt was able to recreate a basic pattern from a photo with max faded to min assist verbal cues.  Pt limited by fatigue and headache throughout today's session but remained willing to continue working with therapist.  Pt was handed off to PT for scheduled appt.  Continue per current plan of care.     Pain Pain Assessment Pain Scale: 0-10 Pain Score: 7  Pain Type: Acute pain Pain Location: Head Pain Descriptors / Indicators: Headache Pain Intervention(s): RN made aware  Therapy/Group: Individual Therapy  Hari Casaus, Melanee Spry 06/30/2023, 12:48 PM

## 2023-06-30 NOTE — Progress Notes (Signed)
Occupational Therapy Session Note  Patient Details  Name: Yolanda [name] MRN: [id number] Date of Birth: 09-03-1933  Today's Date: 06/30/2023 OT Individual Time: 1006-1035 OT Individual Time Calculation (min): 29 min    Short Term Goals: Week 1:  OT Short Term Goal 1 (Week 1): Patient will indicate need to void and transfer to commode with min assist OT Short Term Goal 2 (Week 1): Patient will don socks with mod assist OT Short Term Goal 3 (Week 1): Patient will sequence bathing and dressing with minimal cueing OT Short Term Goal 4 (Week 1): Patient will don pull over dress/ shirt with set up assistance OT Short Term Goal 5 (Week 1): Patient will utilize RUE as dominant for grooming or feeding tasks 25%/time  Skilled Therapeutic Interventions/Progress Updates: Patient received resting in bed with interpreter at bedside. Patient c/o headache and being very tired after a poor nights sleep previous. Patient agreeable to getting OOB for simple bathing and grooming tasks. Patient assisted OOB with supervision for bed mobility and sitting EOB and min/CGA for stand pivot to w/c.  Patient sat at sink in w/c for grooming tasks set up. Moderate assist for bathing to improve task performance likely due more to fatigue than true ability. Able to don a dress with minimal assist and stood for therapist to don brief on patient. Patient requesting to lie back down due to headache. Will Continue follow up with patient in the afternoon to attempt to make up missed half hour of treatment as able. Patient continue to report headache. Unable to make up missed time due to continued discomfort. Continue with skilled OT POC as able.     Therapy Documentation Precautions:  Precautions Precautions: Fall Precaution Comments: SBP goal <150 Restrictions Weight Bearing Restrictions: No General:   Vital Signs:   Pain: Pain Assessment Pain Scale: 0-10 Pain Score: 7  Pain Type: Acute pain Pain Location: Head Pain  Descriptors / Indicators: Headache Pain Intervention(s): RN made aware ADL: ADL Eating: Unable to assess Grooming: Minimal assistance Where Assessed-Grooming: Sitting at sink, Standing at sink Upper Body Bathing: Minimal assistance Where Assessed-Upper Body Bathing: Sitting at sink Lower Body Bathing: Maximal assistance Where Assessed-Lower Body Bathing: Sitting at sink, Standing at sink Upper Body Dressing: Moderate assistance Where Assessed-Upper Body Dressing: Wheelchair Lower Body Dressing: Maximal assistance Where Assessed-Lower Body Dressing: Sitting at sink, Standing at sink Toileting: Unable to assess Toilet Transfer: Not assessed Tub/Shower Transfer: Unable to assess Tub/Shower Transfer Method: Unable to assess [employer] Transfer: Unable to assess [employer] Method: Unable to assess    Therapy/Group: Individual Therapy  Warnell Forester 06/30/2023, 2:24 PM

## 2023-06-30 NOTE — Progress Notes (Signed)
Physical Therapy Session Note  Patient Details  Name: Yolanda [name] MRN: [id number] Date of Birth: 07/23/1933  Today's Date: 06/30/2023 PT Individual Time: 1300-1345 PT Individual Time Calculation (min): 45 min   Short Term Goals: Week 1:  PT Short Term Goal 1 (Week 1): Pt will ambulate 30 ft with assist PT Short Term Goal 2 (Week 1): Pt will tolerate upright positioning >1 hour PT Short Term Goal 3 (Week 1): Pt will perform bed mobility with supervision consistently  Skilled Therapeutic Interventions/Progress Updates: Pt presents supine in bed and no interpreter present at this time.  Attempted video interpreter but none available.  Pt transferred sup to sit w/ gestures and sat on bed w/o UE support, continually reaching for head 2/2 pain.  Pt returned to supine w/ supervision and pt rolling into prone position and then back to sidelying.  In-person interpreter present and pt required encouragement to participate in limited session.  Pt transferred sup to sit w/ supervision and sat EOB.  Pt transferred sit to stand and then to w/c w/ min A.  Pt stood w/ min A and amb forward w/ RW x 3' and min A for walker management and safety.  Pt turned to face bed and "crawled" into bed.  Pt unwilling to participate further 2/2 pain.  Bed alarm on and all needs in reach.     Therapy Documentation Precautions:  Precautions Precautions: Fall Precaution Comments: SBP goal <150 Restrictions Weight Bearing Restrictions: No General: PT Amount of Missed Time (min): 30 Minutes PT Missed Treatment Reason: Pain;Patient unwilling to participate Vital Signs: Therapy Vitals Temp: 98.8 F (37.1 C) Temp Source: Oral Pulse Rate: 71 Resp: 17 BP: (!) 126/55 Patient Position (if appropriate): Lying Oxygen Therapy SpO2: 100 % O2 Device: Room Air Pain:9/10 headache, nsg aware. Pain Assessment Pain Scale: 0-10 Pain Score: 7  Pain Type: Acute pain Pain Location: Head Pain Descriptors / Indicators:  Headache Pain Intervention(s): RN made aware   Therapy/Group: Individual Therapy  Lucio Edward 06/30/2023, 1:54 PM

## 2023-06-30 NOTE — IPOC Note (Signed)
Overall Plan of Care [facility]) Patient Details Name: Yolanda [name] MRN: [id number] DOB: 06/20/1933  Admitting Diagnosis: Subarachnoid hemorrhage [facility])  Hospital Problems: Principal Problem:   Subarachnoid hemorrhage (HCC)     Functional Problem List: Nursing Bowel, Endurance, Medication Management, Safety, Bladder, Pain  PT Balance, Pain, Behavior, Perception, Safety, Endurance, Sensory, Motor, Skin Integrity  OT Balance, Motor, Sensory, Behavior, Skin Integrity, Cognition, Pain, Vision, Perception, Endurance, Safety  SLP Cognition  TR         Basic ADL's: OT Eating, Grooming, Bathing, Dressing, Toileting     Advanced  ADL's: OT       Transfers: PT Bed Mobility, Bed to Chair, Customer service manager, Tub/Shower     Locomotion: PT Ambulation, Psychologist, prison and probation services, Stairs     Additional Impairments: OT None  SLP Social Cognition   Problem Solving, Memory, Attention, Awareness  TR      Anticipated Outcomes Item Anticipated Outcome  Self Feeding supervission  Swallowing  modI   Basic self-care  supervission  Toileting  supervission   Bathroom Transfers supervission  Bowel/Bladder  manage bowel and bladder w mod I assist  Transfers  Supervision with LRAD  Locomotion  CGA with LRAD  Communication  minA  Cognition     Pain  < 4 with prns  Safety/Judgment  manage w cues   Therapy Plan: PT Intensity: Minimum of 1-2 x/day ,45 to 90 minutes PT Frequency: 5 out of 7 days PT Duration Estimated Length of Stay: 12-14 days OT Intensity: Minimum of 1-2 x/day, 45 to 90 minutes OT Frequency: 5 out of 7 days OT Duration/Estimated Length of Stay: 12-14 days SLP Intensity: Minumum of 1-2 x/day, 30 to 90 minutes SLP Frequency: 3 to 5 out of 7 days SLP Duration/Estimated Length of Stay: 12-14   Team Interventions: Nursing Interventions Disease Management/Prevention, Bladder Management, Medication Management, Discharge Planning, Pain Management, Bowel Management, Patient/Family  Education  PT interventions Ambulation/gait training, Cognitive remediation/compensation, Discharge planning, DME/adaptive equipment instruction, Functional mobility training, Pain management, Psychosocial support, Splinting/orthotics, Therapeutic Activities, UE/LE Strength taining/ROM, Visual/perceptual remediation/compensation, Wheelchair propulsion/positioning, UE/LE Coordination activities, Therapeutic Exercise, Stair training, Skin care/wound management, Patient/family education, Neuromuscular re-education, Functional electrical stimulation, Disease management/prevention, Firefighter, Warden/ranger  OT Interventions Warden/ranger, Neuromuscular re-education, Patient/family education, Self Care/advanced ADL retraining, Therapeutic Exercise, UE/LE Coordination activities, Visual/perceptual remediation/compensation, UE/LE Strength taining/ROM, Therapeutic Activities, Pain management, Functional mobility training, DME/adaptive equipment instruction, Discharge planning, Cognitive remediation/compensation  SLP Interventions Cognitive remediation/compensation, Environmental controls, Cueing hierarchy, Functional tasks, Therapeutic Activities, Internal/external aids, Dysphagia/aspiration precaution training, Medication managment, Patient/family education  TR Interventions    SW/CM Interventions Discharge Planning, Psychosocial Support, Patient/Family Education   Barriers to Discharge MD  Medical stability  Nursing Decreased caregiver support, Home environment access/layout Multi level ; main B+B with son, daughter in law and 2 grand children; has SPC  PT Decreased caregiver support, Home environment Best boy    OT      SLP      SW       Team Discharge Planning: Destination: PT-Home ,OT- Home , SLP-Home Projected Follow-up: PT-Home health PT, OT-  Home health OT, SLP-Home Health SLP Projected Equipment Needs: PT-To be determined, OT- To be determined,  SLP-None recommended by SLP Equipment Details: PT- , OT-  Patient/family involved in discharge planning: PT- Family member/caregiver,  OT-Patient unable/family or caregiver not available, SLP-Patient  MD ELOS: 10-12 days Medical Rehab Prognosis:  Excellent Assessment: The patient has been admitted for CIR therapies with the diagnosis of SAH/aneurysm rupture s/p coli  embolization of left MCA aneurysm. The team will be addressing functional mobility, strength, stamina, balance, safety, adaptive techniques and equipment, self-care, bowel and bladder mgt, patient and caregiver education. Goals have been set at supervision. Anticipated discharge destination is home.        See Team Conference Notes for weekly updates to the plan of care

## 2023-07-01 LAB — BASIC METABOLIC PANEL
Anion gap: 7 (ref 5–15)
BUN: 12 mg/dL (ref 8–23)
CO2: 23 mmol/L (ref 22–32)
Calcium: 9 mg/dL (ref 8.9–10.3)
Chloride: 105 mmol/L (ref 98–111)
Creatinine, Ser: 0.74 mg/dL (ref 0.44–1.00)
GFR, Estimated: >60 mL/min (ref >60)
Glucose, Bld: 111 mg/dL — ABNORMAL HIGH (ref 70–99)
Potassium: 3.9 mmol/L (ref 3.5–5.1)
Sodium: 135 mmol/L (ref 135–145)

## 2023-07-01 LAB — CBC
HCT: 36.5 % (ref 36.0–46.0)
Hemoglobin: 11.5 g/dL — ABNORMAL LOW (ref 12.0–15.0)
MCH: 28 pg (ref 26.0–34.0)
MCHC: 31.5 g/dL (ref 30.0–36.0)
MCV: 89 fL (ref 80.0–100.0)
Platelets: 329 10*3/uL (ref 150–400)
RBC: 4.1 MIL/uL (ref 3.87–5.11)
RDW: 15.1 % (ref 11.5–15.5)
WBC: 7.6 10*3/uL (ref 4.0–10.5)
nRBC: 0 % (ref 0.0–0.2)

## 2023-07-01 MED ORDER — BISOPROLOL FUMARATE 5 MG PO TABS
2.5000 mg | ORAL_TABLET | Freq: Every day | ORAL | Status: DC
  Administered 2023-07-02 – 2023-07-03 (×2): 2.5 mg via ORAL
  Filled 2023-07-01 (×2): qty 0.5

## 2023-07-01 MED ORDER — TOPIRAMATE 25 MG PO TABS
50.0000 mg | ORAL_TABLET | Freq: Every day | ORAL | Status: DC
  Administered 2023-07-01: 50 mg via ORAL
  Filled 2023-07-01: qty 2

## 2023-07-01 NOTE — Progress Notes (Signed)
Occupational Therapy Session Note  Patient Details  Name: Kiannah Menth MRN: [id number] Date of Birth: 1933/04/04  Today's Date: 07/01/2023 OT Individual Time: 5284-1324 OT Individual Time Calculation (min): 57 min    Short Term Goals: Week 1:  OT Short Term Goal 1 (Week 1): Patient will indicate need to void and transfer to commode with min assist OT Short Term Goal 2 (Week 1): Patient will don socks with mod assist OT Short Term Goal 3 (Week 1): Patient will sequence bathing and dressing with minimal cueing OT Short Term Goal 4 (Week 1): Patient will don pull over dress/ shirt with set up assistance OT Short Term Goal 5 (Week 1): Patient will utilize RUE as dominant for grooming or feeding tasks 25%/time  Skilled Therapeutic Interventions/Progress Updates:    Patient received seated in bed - family feeding her.  Patient agreeable to get out of bed.  Transported to bathroom where she indicated a need to void, and had continent void on toilet.  Able to complete hygiene.  Agreeable to shower and took steps to shower seat.  Patient unable to hold shower head in right hand.  Right inattention more apparent today then on eval now that patient tolerating more wakeful times.  Patient showered with min assist.  Patient sighing loudly and holding right side of head after shower.  Stood for aspects of oral care, then walked to bed with bilateral hand hold assist - min physical assistance for upright postural control and to reduce lateral sway.  Family indicates that patient had about 3-44 hrs/day of caregiver prior to hospitalization.   Patient returned to bed.  Bed alarm engaged and family at bedside.    Therapy Documentation Precautions:  Precautions Precautions: Fall Precaution Comments: SBP goal <150 Restrictions Weight Bearing Restrictions: No   Pain: Pain Assessment Pain Scale: 0-10 Pain Score: 6  Pain Type: Acute pain Pain Location: Head Called nursing for pain medication        Therapy/Group: Individual Therapy  Collier Salina 07/01/2023, 9:59 AM

## 2023-07-01 NOTE — Progress Notes (Signed)
PROGRESS NOTE   Subjective/Complaints: No new complaints this morning Continues to heave headaches Has been receiving fioricet, will increasae topamax at night  ROS: +headache, Denies CP/SOB/Fever/chills/new motor or sensory changes  Objective:   No results found. Recent Labs    07/01/23 0713  WBC 7.6  HGB 11.5*  HCT 36.5  PLT 329   Recent Labs    07/01/23 0713  NA 135  K 3.9  CL 105  CO2 23  GLUCOSE 111*  BUN 12  CREATININE 0.74  CALCIUM 9.0    Intake/Output Summary (Last 24 hours) at 07/01/2023 1039 Last data filed at 07/01/2023 0808 Gross per 24 hour  Intake 840 ml  Output 1900 ml  Net -1060 ml        Physical Exam: Vital Signs Blood pressure 134/61, pulse (!) 56, temperature 98.2 F (36.8 C), temperature source Oral, resp. rate 18, weight 54.8 kg, SpO2 99%. Gen: no distress, normal appearing, HEENT: oral mucosa pink and moist, NCAT Cardio: Bradycardia Chest: CTAB, normal effort, normal rate of breathing Abd: s soft, nontender, nondistended, normal active bowel sounds Ext: no edema Psych: pleasant, normal affect Skin: Clean and intact without signs of breakdown Small area of redness on right wrist-improved Neuro: Alert and awake Left upper extremity strength grossly 4 out of 5 Right upper extremity strength grossly 4- out of 5 Bilateral lower extremity strength grossly 4 out of 5 MMT limited by difficulty following commands Sensation intact to light touch in all 4 extremities Musculoskeletal:  No joint swelling or tenderness noted No abnormal tone appreciated     Assessment/Plan: 1. Functional deficits which require 3+ hours per day of interdisciplinary therapy in a comprehensive inpatient rehab setting. Physiatrist is providing close team supervision and 24 hour management of active medical problems listed below. Physiatrist and rehab team continue to assess barriers to discharge/monitor  patient progress toward functional and medical goals  Care Tool:  Bathing    Body parts bathed by patient: Right arm, Left arm, Chest, Abdomen, Front perineal area, Right upper leg, Left upper leg, Face   Body parts bathed by helper: Buttocks, Left lower leg, Right lower leg     Bathing assist Assist Level: Moderate Assistance - Patient 50 - 74%     Upper Body Dressing/Undressing Upper body dressing   What is the patient wearing?: Pull over shirt    Upper body assist Assist Level: Minimal Assistance - Patient > 75%    Lower Body Dressing/Undressing Lower body dressing      What is the patient wearing?: Incontinence brief, Pants     Lower body assist Assist for lower body dressing: Maximal Assistance - Patient 25 - 49%     Toileting Toileting Toileting Activity did not occur (Clothing management and hygiene only): N/A (no void or bm)  Toileting assist Assist for toileting: Minimal Assistance - Patient > 75%     Transfers Chair/bed transfer  Transfers assist     Chair/bed transfer assist level: Contact Guard/Touching assist     Locomotion Ambulation   Ambulation assist      Assist level: 2 helpers (mod A with w/c follow) Assistive device: Walker-rolling Max distance: 13 ft  Walk 10 feet activity   Assist     Assist level: Moderate Assistance - Patient - 50 - 74% Assistive device: Walker-rolling   Walk 50 feet activity   Assist Walk 50 feet with 2 turns activity did not occur: Safety/medical concerns         Walk 150 feet activity   Assist Walk 150 feet activity did not occur: Safety/medical concerns         Walk 10 feet on uneven surface  activity   Assist Walk 10 feet on uneven surfaces activity did not occur: Safety/medical concerns         Wheelchair     Assist Is the patient using a wheelchair?: Yes Type of Wheelchair: Manual    Wheelchair assist level: Dependent - Patient 0% Max wheelchair distance: 150 ft     Wheelchair 50 feet with 2 turns activity    Assist        Assist Level: Dependent - Patient 0%   Wheelchair 150 feet activity     Assist      Assist Level: Dependent - Patient 0%   Blood pressure 134/61, pulse (!) 56, temperature 98.2 F (36.8 C), temperature source Oral, resp. rate 18, weight 54.8 kg, SpO2 99%.  Medical Problem List and Plan: 1. 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  Antithrombotics: -DVT/anticoagulation:  Pharmaceutical: Heparin             -antiplatelet therapy: Aspirin and Plavix for three weeks followed by aspirin alone   3. Headaches: topamax ordered HS. Tylenol as needed  -9/7 she did not get Topamax last night however she was given 25 mg earlier this morning for headache.  Will start Fioricet as needed.  If headache worsens could consider CT scan of the head  -9/8 headache a little better today but continues, will add gabapentin 100mg  TID   4. Mood/Behavior/Sleep: LCSW to evaluate and provide emotional support             -continue Rozerem 8 mg q HS             -continue Zoloft 25 mg daily             -continue trazodone 100 mg q HS             -antipsychotic agents: n/a   5. Neuropsych/cognition: This patient is not capable of making decisions on her own behalf.   6. Skin/Wound Care: Routine skin care checks   7. Fluids/Electrolytes/Nutrition: Routine Is and Os and follow-up chemistries             -vegetarian diet/dysphagia 3; continue Ensure             -Dysphagia- SLP eval, monitor p.o. intake   8: Hypertension: monitor TID and prn             -continue bisoprolol 5 mg daily  -9/8 blood pressure controlled, continue current regimen      07/01/2023    5:00 AM 07/01/2023    2:45 AM 06/30/2023    5:17 PM  Vitals with  BMI  Weight 120 lbs 13 oz    BMI 27.1  Systolic  134 114  Diastolic  61 88  Pulse  56 69    9: Hyperlipidemia: continue statin   10: SAH: continue nimodipine 60 mg q 4 hours through 9/09   11: Urinary retention: continue Flomax             -observe voiding and consider d/c  -9/7-8 Has been continent, recent bladder scan is not particularly elevated-continue to monitor     12: Loose stool: started on Florastor   13: Aspiration pneumonia: abx completed; history of asthma.              -continue Brovana and Pulmicort nebulizers BID             -consider MDI in place of nebs             -FV/IS   14: Prediabetes: A1c = 5.8%             -CBGs improving off TF and steroids (check BID), provided list of foods for improved blood sugar control   15: Leukocytosis: improving; steroids may have been contributing; Unasyn completed 9/05; remains afebrile             -9/8 improved on last CBC to 9.6, recheck tomorrow   16: Acute anemia of critical illness: discussed that hemoglobin is improving  17: Acute diastolic CHF/garde 1 diastolic dysfunction/pulm edema/elevated BNP             -received Lasix, EF =  70-75%             -daily weight, monitor for signs of fluid overload  -weight monitored and stable  Filed Weights   06/29/23 0701 06/30/23 0326 07/01/23 0500  Weight: 55.5 kg 55.3 kg 54.8 kg      18.  Acute delirium.  Improving.  Continue trazodone 100 mg at bedtime.  19. Overweight: encouraged avoiding added sugars in drinks, d/c Ensure between meals  20. Hypoalbuminemia: encouraged whole food sources of protein  21. Bradycardia: decrease Zebta to 2.5mg  daily, messaged nursing to check on urination to see if flomax can be weaned  22. Headache: increase topamax to 50mg  HS, continue Fioricet, discussed with nursing applying ice to forehead prn  23. Hyperglycemia: d/c Ensure between meals  I spent >27mins performing patient care related activities, including face to face  time, documentation time, med management, discussion of meds and lab orders with patient, and overall coordination of care.    LOS: 4 days A FACE TO FACE EVALUATION WAS PERFORMED  Drema Pry Jamese Trauger 07/01/2023, 10:39 AM

## 2023-07-01 NOTE — Progress Notes (Signed)
Physical Therapy Session Note  Patient Details  Name: Yolanda [name] MRN: [id number] Date of Birth: 27-Dec-1932  Today's Date: 07/01/2023 PT Individual Time: 1015-1057 + [id number] PT Individual Time Calculation (min): 42 min  + 41 min  Short Term Goals: Week 1:  PT Short Term Goal 1 (Week 1): Pt will ambulate 30 ft with assist PT Short Term Goal 2 (Week 1): Pt will tolerate upright positioning >1 hour PT Short Term Goal 3 (Week 1): Pt will perform bed mobility with supervision consistently  Skilled Therapeutic Interventions/Progress Updates:      1st session: Pt sleeping in bed with sheets over her head. Family (DIL and daughter) present to start. In person interpreter arriving later in session. Pt reports headache, frequently grabbing her temporal region on R side throughout session. LPN reports patient has had pain Rx recently and is aware of pain.   Pt requests to use the bathroom prior to leaving her room. Bed mobility completed with minA for initiation only. Sit<>stand with minA for initiation with PT positioned in front of her. She ambulated from EOB to bathroom, ~32ft, with bilateral HHA from PT and from family member. Patient continent of bladder void, family assisted with pericare and personal hygiene, per patient request. Ambulated to her w/c in her room with similar assist as above and then wheeled sinkside to complete hand hygiene. When provided foam soap, patient initially attempting to eat this rather than use it for washing hands - unsure if related to language barrier vs. Cognitive deficits.   Transported in w/c to main rehab gym. Initiated gait training using 1 person bilateral HHA with min/modA for ~20ft. Patient with very flexed trunk, wide BOS, and downward gaze. Difficulty correcting with cues, again possibly related to language barrier. Introduced Scientist, research (medical) to facilitate AD and upright posture during gait. She ambulated 191ft with minA of 1 person using EVA walker, similar  deficits above but improved upright posture, especially with goal oriented distance.   Pt returned to her room and patient requesting to lie back in bed. Short distance ambulatory transfer with minA and no AD back to bed. Patient "crawling" to enter bed, no knee buckling on her L leg and able to lift her R leg fully into bed safely. Session concluded with alarm on, family present, pt made comfortable in bed.   2nd session: Pt lying in bed, direct handoff of care from OT. Family member and in person interpreter present during treatment. Patient complaining of headache - LPN notified who provided pain Rx during treatment session. Bed mobility completed with supervision and light minA for initiation. Patient donned B socks at EOB with setupA - pt with poor frustration tolerance and fatigues quickly, even when putting on just socks. Sit<>stand with CGA/minA and completes minA stand pivot transfer. Patient transported in w/c to main rehab gym. Instructed in stair training using 6" steps and 2 hand rails. Patient needing time to initiate task due to her headache. Able to completed x4, 6" steps with minA with a step-to pattern for both ascent/descent. No knee buckling observed but general unsteadiness present. After completing the stairs, setup patient to begin gait training with EVA walker. Patient restless and grabbing her head, reporting severe headache. BP checked at 155/77 and provided her with some sunglasses to help with possible light sensitivity. Pt reporting no improvement in symptoms after wearing sunglasses for >5 minutes and headache to severe to continue gait training. Therefore, returned to her room and assisted to bed as she "crawled" into  bed and flopped down into prone position. Family member present to help make patient more comfortable. All needs met at end of treatment.     Therapy Documentation Precautions:  Precautions Precautions: Fall Precaution Comments: SBP goal  <150 Restrictions Weight Bearing Restrictions: No General:      Therapy/Group: Individual Therapy  Don Tiu P Kaleeah Gingerich PT 07/01/2023, 7:53 AM

## 2023-07-01 NOTE — Plan of Care (Signed)
Goals downgraded after discussion with family with patient currently presenting close to baseline level of cognitive functioning.   Problem: RH Swallowing Goal: LTG Patient will consume least restrictive diet using compensatory strategies with assistance (SLP) Description: LTG:  Patient will consume least restrictive diet using compensatory strategies with assistance (SLP) Flowsheets (Taken 07/01/2023 1337) LTG: Pt Patient will consume least restrictive diet using compensatory strategies with assistance of (SLP): Supervision   Problem: RH Cognition - SLP Goal: RH LTG Patient will demonstrate orientation with cues Description:  LTG:  Patient will demonstrate orientation to person/place/time/situation with cues (SLP)   Flowsheets (Taken 07/01/2023 1337) LTG Patient will demonstrate orientation to:  Time  Situation  Place LTG: Patient will demonstrate orientation using cueing (SLP): Moderate Assistance - Patient 50 - 74%   Problem: RH Memory Goal: LTG Patient will demonstrate ability for day to day (SLP) Description: LTG:   Patient will demonstrate ability for day to day recall/carryover during cognitive/linguistic activities with assist  (SLP) Flowsheets (Taken 07/01/2023 1337) LTG: Patient will demonstrate ability for day to day recall: New information LTG: Patient will demonstrate ability for day to day recall/carryover during cognitive/linguistic activities with assist (SLP): Maximal Assistance - Patient 25 - 49%   Problem: RH Awareness Goal: LTG: Patient will demonstrate awareness during functional activites type of (SLP) Description: LTG: Patient will demonstrate awareness during functional activites type of (SLP) Flowsheets (Taken 07/01/2023 1337) Patient will demonstrate during cognitive/linguistic activities awareness type of: Intellectual LTG: Patient will demonstrate awareness during cognitive/linguistic activities with assistance of (SLP): Moderate Assistance - Patient 50 - 74%

## 2023-07-01 NOTE — Progress Notes (Addendum)
Speech Language Pathology Daily Session Note  Patient Details  Name: Yolanda [name] MRN: [id number] Date of Birth: November 11, 1932  Today's Date: 07/01/2023 SLP Individual Time: 1300-1356 SLP Individual Time Calculation (min): 56 min  Short Term Goals: Week 1: SLP Short Term Goal 1 (Week 1): Patient will orient to time, place, and situation given modA for use of available room aids. SLP Short Term Goal 2 (Week 1): Patient will verbalize 2 cognitive and 2 physical deficits given modA. SLP Short Term Goal 3 (Week 1): Patient will utilize environmental aids to recall basic daily events given modA. SLP Short Term Goal 4 (Week 1): Patient will tolerate least restrictive diet utilizing recommended swallowing safety strategies given minA.  Skilled Therapeutic Interventions: SLP conducted skilled therapy session targeting cognitive retraining goals. SLP received patient laying in bed with significant headache. Patient reported 10/10 pain and exhibited rocking/moaning behavior, often grabbing head. SLP alerted nursing staff who indicated they had been aware and patient was not due for pain medications at this time. Patient reported that she would like to continue with therapy despite pain. Patient oriented to month and location but disoriented to situation. Eldest daughter present at bedside and stated patient is close to baseline level of cognitive functioning. As patient is close to baseline level of functioning, SLP downgraded goals to reflect realistic outcome expectations. At baseline, patient's son completes all cognitive ADLs for patient, and patient reports she enjoys watching motivational religious speeches on the phone throughout the day to pass the time. SLP guided patient through basic problem solving tasks targeting environmental awareness and awareness of deficits with patient requiring maxA to provide adequate responses. At end of session, patient requested assistance for use of bathroom. SLP alerted  nursing staff and warm handoff was completed. SLP will continue to target goals per plan of care.      Pain Pain Assessment Pain Scale: 0-10 Pain Score: 10-Worst pain ever Pain Location: Head Pain Intervention(s): Repositioned;RN made aware  Therapy/Group: Individual Therapy  Jeannie Done, M.A., CCC-SLP  Yetta Barre 07/01/2023, 3:48 PM

## 2023-07-02 DIAGNOSIS — G4700 Insomnia, unspecified: Secondary | ICD-10-CM

## 2023-07-02 MED ORDER — TOPIRAMATE 25 MG PO TABS
75.0000 mg | ORAL_TABLET | Freq: Every day | ORAL | Status: DC
  Administered 2023-07-02: 75 mg via ORAL
  Filled 2023-07-02: qty 3

## 2023-07-02 NOTE — Progress Notes (Signed)
Occupational Therapy Session Note  Patient Details  Name: Yolanda [name] MRN: [id number] Date of Birth: 1933-09-27  Today's Date: 07/02/2023 OT Individual Time: 6433-2951 session 1  OT Individual Time Calculation (min): 59 min  Session 2: 8841-6606   Short Term Goals: Week 1:  OT Short Term Goal 1 (Week 1): Patient will indicate need to void and transfer to commode with min assist OT Short Term Goal 2 (Week 1): Patient will don socks with mod assist OT Short Term Goal 3 (Week 1): Patient will sequence bathing and dressing with minimal cueing OT Short Term Goal 4 (Week 1): Patient will don pull over dress/ shirt with set up assistance OT Short Term Goal 5 (Week 1): Patient will utilize RUE as dominant for grooming or feeding tasks 25%/time  Skilled Therapeutic Interventions/Progress Updates:  Session 1: Pt greeted supine in bed with son present acting as interpreter initially, pt agreeable to OT intervention. Session focus on BADL reeducation, functional mobility, dynamic standing balance, RUE coordination/motor planning and decreasing overall caregiver burden.     Pt completed supine>sit with MINA, pt reports need to void b/b. Pt completed ambulatory toilet transfer with bilateral hand held assist with [facility]. Pt needed MIN verbal cues for sequencing to pivot to toilet. Pt with continent b/b void. Pt able to complete 3/3 toilteing tasks with MIN A for balance with pt able to access posterior/anterior periarea. Assist needed to manage brief.        Pt reports dizziness therefore brought up w/c for safety. BP assessed from w/c- 127/69(88) HR 79 Interpreter present at this time of session Pt able to stand at sink for oral care. Pt needed MIN A to sequence oral care as pt begins brushing teeth with no water or paste, but did incorporate RUE into task with no cues needed. Pt noted to lean on sink needing CGA for balance d/t fatigue.   Pt transported to gym in w/c with total A. Pt completed 2 bouts  of functional mobility to facilitate improved activity tolerance ( ~ 10 ft total ) with pt needing MIN A +2 for safety with bilateral HHA. Worked on standing tolerance with pt instructed to sit>stand from mat table to match cones to dotes to facilitate improved functional reaching in RUE. Pt completed task with increased time and MOD cues for sequencing.    Pt c/o dizziness after standing task, BP assessed from sitting on mat table- 109/97( 103) HR 79 Pt transported back to room in w/c with total A.        Ended session with pt supine in bed with all needs within reach and bed alarm activated.                    Session 2: pt greeted supine in bed with daughters present, pt c/o pain from HA but agreeable to OT intervention. Pt completed supine>sit with close CGA as pt prefers to crawl to the EOB despite cues. Pt completed ambulatory toilet transfer to toilet with HHA from this therapist and her daughter. Pt with continent urine void able to complete 3/3 toileting tasks with MINA. Of note pt threw toilet paper on floor vs in toilet, family reports this is not her norm. Pt also needs assist to manage clothing on R side. Of note family tries to assist pt heavily with ADLS despite encouragement to try to allow pt to see what she can do on her own, feel this is likely cultural.  Pt completed functional ambulation to  w/c with bilateral hand held assist with MINA, MOD cues for sequencing and safety as pt attempting to crawl up into w/c.   Interpreter arrived at this time. Remainder of session focused on functional mobility with RW to increase independence with ADLs. Pt requires up to [facility] for RW mgmt as pt wants to roll RW in front of her like a shopping cart. Pt also requires multimodal cues for hand placement on RW. Education provided to pt and family on practicing with LRAD to assist with independence and provide stability as pt currently furniture walks around room. Pt completed functional mobility with RW in  apt with pt having increased difficulty rolling RW over carpet. Had pt practice bed mobility to flat [facility] with pt crawling into bed, family reports this is what she did at baseline.   Daughters asking when pt can be DC'ed however daughter reports that pt lives with her son. Education provided on team conference tomorrow and DC date to be provided after conference.   Ended session with pt supine in bed, handed off directly to SLP.   Therapy Documentation Precautions:  Precautions Precautions: Fall Precaution Comments: SBP goal <150 Restrictions Weight Bearing Restrictions: No  Pain: Session 1: unrated pain reported in head from HA, pain meds, rest breaks and repositioning provided as needed Session 2: unrated pain reported from HA. Rest breaks provided as needed.     Therapy/Group: Individual Therapy  Pollyann Glen [facility] 07/02/2023, 10:10 AM

## 2023-07-02 NOTE — Progress Notes (Addendum)
PROGRESS NOTE   Subjective/Complaints: No new complaints this morning Rates headache as very severe, messaged nursing to apply ice to head, discussed increasing dose of topamax at night  ROS: +headache, +insomnia Denies CP/SOB/Fever/chills/new motor or sensory changes  Objective:   No results found. Recent Labs    07/01/23 0713  WBC 7.6  HGB 11.5*  HCT 36.5  PLT 329   Recent Labs    07/01/23 0713  NA 135  K 3.9  CL 105  CO2 23  GLUCOSE 111*  BUN 12  CREATININE 0.74  CALCIUM 9.0    Intake/Output Summary (Last 24 hours) at 07/02/2023 0944 Last data filed at 07/01/2023 1849 Gross per 24 hour  Intake 480 ml  Output --  Net 480 ml        Physical Exam: Vital Signs Blood pressure 127/69, pulse 79, temperature 98.2 F (36.8 C), temperature source Oral, resp. rate 18, weight 54.2 kg, SpO2 98%. Gen: no distress, normal appearing, BMI 26.79 HEENT: oral mucosa pink and moist, NCAT Cardio: Bradycardia Chest: CTAB, normal effort, normal rate of breathing Abd: s soft, nontender, nondistended, normal active bowel sounds Ext: no edema Psych: pleasant, normal affect Skin: Clean and intact without signs of breakdown Small area of redness on right wrist-improved Neuro: Alert and awake, oriented to month and location Left upper extremity strength grossly 4 out of 5 Right upper extremity strength grossly 4- out of 5 Bilateral lower extremity strength grossly 4 out of 5 MMT limited by difficulty following commands Sensation intact to light touch in all 4 extremities Musculoskeletal:  No joint swelling or tenderness noted No abnormal tone appreciated     Assessment/Plan: 1. Functional deficits which require 3+ hours per day of interdisciplinary therapy in a comprehensive inpatient rehab setting. Physiatrist is providing close team supervision and 24 hour management of active medical problems listed  below. Physiatrist and rehab team continue to assess barriers to discharge/monitor patient progress toward functional and medical goals  Care Tool:  Bathing    Body parts bathed by patient: Right arm, Left arm, Chest, Abdomen, Front perineal area, Right upper leg, Left upper leg, Face   Body parts bathed by helper: Buttocks, Left lower leg, Right lower leg     Bathing assist Assist Level: Moderate Assistance - Patient 50 - 74%     Upper Body Dressing/Undressing Upper body dressing   What is the patient wearing?: Pull over shirt    Upper body assist Assist Level: Minimal Assistance - Patient > 75%    Lower Body Dressing/Undressing Lower body dressing      What is the patient wearing?: Incontinence brief, Pants     Lower body assist Assist for lower body dressing: Maximal Assistance - Patient 25 - 49%     Toileting Toileting Toileting Activity did not occur (Clothing management and hygiene only): N/A (no void or bm)  Toileting assist Assist for toileting: Minimal Assistance - Patient > 75%     Transfers Chair/bed transfer  Transfers assist     Chair/bed transfer assist level: Contact Guard/Touching assist     Locomotion Ambulation   Ambulation assist      Assist level: Minimal Assistance - Patient >  75% Assistive device: Fara Boros Max distance: 112ft   Walk 10 feet activity   Assist     Assist level: Minimal Assistance - Patient > 75% Assistive device: Walker-Eva   Walk 50 feet activity   Assist Walk 50 feet with 2 turns activity did not occur: Safety/medical concerns  Assist level: Minimal Assistance - Patient > 75% Assistive device: Walker-Eva    Walk 150 feet activity   Assist Walk 150 feet activity did not occur: Safety/medical concerns         Walk 10 feet on uneven surface  activity   Assist Walk 10 feet on uneven surfaces activity did not occur: Safety/medical concerns         Wheelchair     Assist Is the  patient using a wheelchair?: Yes Type of Wheelchair: Manual    Wheelchair assist level: Dependent - Patient 0% Max wheelchair distance: 150 ft    Wheelchair 50 feet with 2 turns activity    Assist        Assist Level: Dependent - Patient 0%   Wheelchair 150 feet activity     Assist      Assist Level: Dependent - Patient 0%   Blood pressure 127/69, pulse 79, temperature 98.2 F (36.8 C), temperature source Oral, resp. rate 18, weight 54.2 kg, SpO2 98%.  Medical Problem List and Plan: 1. 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  Antithrombotics: -DVT/anticoagulation:  Pharmaceutical: Heparin             -antiplatelet therapy: Aspirin and Plavix for three weeks followed by aspirin alone   3. Headaches: topamax ordered HS- increase to 75mg  HS. Tylenol as needed   Will start Fioricet as needed.  If headache worsens could consider CT scan of the head  Continue gabapentin 100mg  TID   4. Insomnia: increase topamax to 75mg  HS             -continue Rozerem 8 mg q HS             -continue Zoloft 25 mg daily             -continue trazodone 100 mg q HS             -antipsychotic agents: n/a   5. Neuropsych/cognition: This patient is not capable of making decisions on her own behalf.   6. Skin/Wound Care: Routine skin care checks   7. Fluids/Electrolytes/Nutrition: Routine Is and Os and follow-up chemistries             -vegetarian diet/dysphagia 3; continue Ensure             -Dysphagia- SLP eval, monitor p.o. intake   8: Hypertension:  monitor TID and prn             -continue bisoprolol 5 mg daily  -9/8 blood pressure controlled, continue current regimen      07/02/2023    7:53 AM 07/02/2023    4:05  AM 07/02/2023    3:43 AM  Vitals with BMI  Weight   119 lbs 8 oz  BMI   26.8  Systolic 127 142   Diastolic 69 73   Pulse 79 73     9: Hyperlipidemia: continue statin   10: SAH: continue nimodipine 60 mg q 4 hours through 9/09   11: Urinary retention: continue Flomax             -observe voiding and consider d/c  -9/7-8 Has been continent, recent bladder scan is not particularly elevated-continue to monitor     12: Loose stool: started on Florastor   13: Aspiration pneumonia: abx completed; history of asthma.              -continue Brovana and Pulmicort nebulizers BID             -consider MDI in place of nebs             -FV/IS   14: Prediabetes: A1c = 5.8%             -CBGs improving off TF and steroids (check BID), provided list of foods for improved blood sugar control   15: Leukocytosis: improving; steroids may have been contributing; Unasyn completed 9/05; remains afebrile             -9/8 improved on last CBC to 9.6, recheck tomorrow   16: Acute anemia of critical illness: discussed that hemoglobin is improving  17: Acute diastolic CHF/garde 1 diastolic dysfunction/pulm edema/elevated BNP             -received Lasix, EF =  70-75%             -daily weight, monitor for signs of fluid overload  -weight monitored and stable  Filed Weights   06/30/23 0326 07/01/23 0500 07/02/23 0343  Weight: 55.3 kg 54.8 kg 54.2 kg      18.  Acute delirium.  Improving.  Continue trazodone 100 mg at bedtime.  19. Overweight: encouraged avoiding added sugars in drinks, d/c Ensure between meals  20. Hypoalbuminemia: encouraged whole food sources of protein  21. Bradycardia: decrease Zebta to 2.5mg  daily, messaged nursing to check on urination to see if flomax can be weaned  22. Headache: increase topamax to 50mg   HS, continue Fioricet, discussed with nursing applying ice to forehead prn  23. Hyperglycemia: d/c Ensure between meals  >50 minutes spent in discussing patient's headache and insomnia, recommended increasing topamax at night which can help with both, educated patient that headache is likely secondary to bleed resorbing and often lasts about 2 weeks after bleed, discussed that we would call son on Wednesday after team conference to let him know estimated discharge date, therapy notes reviewed, discussed with therapy that she is having a hard time tolerating due to the severity of her headaches; changed therapy frequency to 15/7  LOS: 5 days A FACE TO FACE EVALUATION WAS PERFORMED  Drema Pry Javion Holmer 07/02/2023, 9:44 AM

## 2023-07-02 NOTE — Progress Notes (Addendum)
Speech Language Pathology Daily Session Note  Patient Details  Name: Yolanda [name] MRN: [id number] Date of Birth: 1932/12/04  Today's Date: 07/02/2023 SLP Individual Time: 1354-1420 SLP Individual Time Calculation (min): 26 min  Short Term Goals: Week 1: SLP Short Term Goal 1 (Week 1): Patient will orient to time, place, and situation given modA for use of available room aids. SLP Short Term Goal 2 (Week 1): Patient will verbalize 2 cognitive and 2 physical deficits given modA. SLP Short Term Goal 3 (Week 1): Patient will utilize environmental aids to recall basic daily events given modA. SLP Short Term Goal 4 (Week 1): Patient will tolerate least restrictive diet utilizing recommended swallowing safety strategies given minA.  Skilled Therapeutic Interventions: SLP conducted skilled therapy session targeting cognitive retraining skills. SLP attempted to begin session at scheduled time, however patient away with OT until 1353; thus, SLP session began at 1354. Patient reporting continued significant headache pain, "worse than yesterday", laying face down and grabbing head during first portion of session with SLP discussing patient progress and status with present family members via interpreter. SLP provided cues to patient with patient benefiting from [facility] verbal cues to reposition into upright sitting position for cognitive tasks. Patient moaning throughout, grabbing head. SLP alerted RN to concerns and RN reported patient has had ongoing headache but was due for pain medications. SLP facilitated sorting task with patient sliding from sitting to laying, requiring maxA to sustain attention to task. Patient successfully sorted 6/8 cards given modA, then requested to cease therapy session and rest d/t pain. Patient was left in lowered bed with call bell in reach and bed alarm set with RN on the way to administer medications as indicated. SLP will continue to target goals per plan of care.        Pain Pain Assessment Pain Scale: Faces Faces Pain Scale: Hurts whole lot Pain Location: Head  Therapy/Group: Individual Therapy  Jeannie Done, M.A., CCC-SLP  Yetta Barre 07/02/2023, 3:51 PM

## 2023-07-02 NOTE — Progress Notes (Signed)
Physical Therapy Session Note  Patient Details  Name: Yolanda [name] MRN: [id number] Date of Birth: January 15, 1933  Today's Date: 07/02/2023 PT Individual Time: 1000-1038 PT Individual Time Calculation (min): 38 min    Short Term Goals: Week 1:  PT Short Term Goal 1 (Week 1): Pt will ambulate 30 ft with assist PT Short Term Goal 2 (Week 1): Pt will tolerate upright positioning >1 hour PT Short Term Goal 3 (Week 1): Pt will perform bed mobility with supervision consistently  Skilled Therapeutic Interventions/Progress Updates:      Pt in bed with all lights off, interpreter present during session. Pt reporting "severe" headache. LPN made aware who provided pain Rx during treatment. Patient requesting to go to the bathroom before leaving her room. Bed mobility completed with minA for initiation. Donned socks with dependent assist for time management. Sit<>stand with B HHA with minA from EOB and ambulated with B HHA with minA to the toilet with cues for safety approach. Patient continent x2 on toilet, totalA required for posterior pericare. Ambulatory transfer with B HHA and minA back to her w/c and then transferred to ortho rehab gym. Assisted onto Nustep with B HHA minA transfer and setupA required due to R inattention. Patient demonstrating some motor planning deficits while attempting to complete Nustep activity, needing max directional and instructional cueing - patient reporting continued severe headache and unable to complete task for > because of this.  Offered alternatives and attempted gait training but headache to severe. Messaged care team re: decreased her therapy frequency to 15/7 to accommodate for poor activity tolerance. Returned to her room and assisted to bed (pt crawling in bed) with a minA ambulatory transfer. Questioning behavior related self limitations during therapy. All needs met with bed alarm on, call bell in lap. She missed the last 7 minutes of therapy because of headache  pain.    Therapy Documentation Precautions:  Precautions Precautions: Fall Precaution Comments: SBP goal <150 Restrictions Weight Bearing Restrictions: No General:       Therapy/Group: Individual Therapy  Yolanda [name] 07/02/2023, 7:35 AM

## 2023-07-03 ENCOUNTER — Inpatient Hospital Stay (HOSPITAL_COMMUNITY): Payer: Medicare Other

## 2023-07-03 MED ORDER — OXYCODONE-ACETAMINOPHEN 5-325 MG PO TABS: 1.0000 | ORAL_TABLET | ORAL | Status: DC | PRN

## 2023-07-03 MED ORDER — TOPIRAMATE 25 MG PO TABS
100.0000 mg | ORAL_TABLET | Freq: Every day | ORAL | Status: DC
  Administered 2023-07-03: 100 mg via ORAL
  Filled 2023-07-03: qty 4

## 2023-07-03 MED ORDER — BISOPROLOL FUMARATE 5 MG PO TABS
5.0000 mg | ORAL_TABLET | Freq: Every day | ORAL | Status: DC
  Administered 2023-07-04 – 2023-07-05 (×2): 5 mg via ORAL
  Filled 2023-07-03 (×2): qty 1

## 2023-07-03 NOTE — Progress Notes (Addendum)
Patient ID: Yolanda [name], female   DOB: 04/03/1933, 89 y.o.   MRN: [id number]  Met with daughter in-law who is present in pt's room to give update regarding team conference goals of supervision-CGA and discharge date 9/13. Team and MD felt pt would do better in a home environment and with her pain in her head, can rest more. Daughter in-law was agreeable and agreed to DME and follow up home health. No preference regarding home health. Have placed order youth rolling walker, 3 in 1 and tub bench via [employer]. Discussed hospital bed not recommended due to very mobile and need to elevate with pillows if needed or can get a wedge for the head of the bed. Will continue to work on discharge needs.   2;22 PM Daughter in-law needs transport chair versus rolling walker. Have contacted [employer] to switch this out. Referral made to [facility] for home health no preference.

## 2023-07-03 NOTE — Plan of Care (Signed)
  Problem: RH PAIN MANAGEMENT Goal: RH STG PAIN MANAGED AT OR BELOW PT'S PAIN GOAL Description: < 4 with prns Outcome: Not Progressing; c/o headache

## 2023-07-03 NOTE — Patient Care Conference (Signed)
Inpatient RehabilitationTeam Conference and Plan of Care Update Date: 07/03/2023   Time: 11:15 AM    Patient Name: Yolanda [name]      Medical Record Number: [id number]  Date of Birth: April 27, 1933 Sex: Female         Room/Bed: 4M10C/4M10C-01 Payor Info: Payor: MEDICARE / Plan: MEDICARE PART A AND B / Product Type: *No Product type* /    Admit Date/Time:  06/27/2023  2:33 PM  Primary Diagnosis:  Subarachnoid hemorrhage [facility])  Hospital Problems: Principal Problem:   Subarachnoid hemorrhage [facility])    Expected Discharge Date: Expected Discharge Date: 07/05/23  Team Members Present: Physician leading conference: Dr. Sula Soda Social Worker Present: Dossie Der, LCSW Nurse Present: Chana Bode, RN PT Present: Wynelle Link, PT OT Present: Roney Mans, OT SLP Present: Jeannie Done, SLP PPS Coordinator present : Fae Pippin, SLP     Current Status/Progress Goal Weekly Team Focus  Bowel/Bladder   Pt is continent of b/b. LBM: 9/10   Remain continent of b/b. Monitor voids and bladder scan when necessary.   Assist w/ toileting needs q 2-4 hours and as needed.    Swallow/Nutrition/ Hydration   Dys3/thin liquids   supervision for safe swallowing strategies  ongoing diet tolerance    ADL's                Mobility   supervision bed mobility, minA sit<>stand (for initiation primarily), minA gait using EVA walker 119ft, minA x4 stairs using 2 hand rails.   CGA / SUPERVISON  Barriers: severe headache pain and poor activity tolerance. Working on managing these, progressing mobility as tolerated. May benefit from 15/7 for therapy frequency    Communication                Safety/Cognition/ Behavioral Observations  maxA   modA   basic problem solving, attention, orientation with mod-maxA    Pain   Pt c/o of 10/10 headaches. Tylenol & Fiorcet given. Ice packs to head are also recommended per MD note.   Pt will rate pain < 4/10.   Asses pain q shift & pain.     Skin   Bruising to bilat. arms & abdomen.   Maintain skin integrity.  Assess skin q shift & PRN.      Discharge Planning:  HOme with family members and 5 hour cargiver. Aware will need 24/7 care at discharge. Will make discharge arrangements once know DME and HH needs   Team Discussion: Patient with severe headaches; on medications. MD added ice.  Patient on target to meet rehab goals: no, currently patient is not able to tolerate the CIR program due to pain and given dementia and pre-morbid cognitive baseline with caregiver for IADLs PTA; schedule adjusted to 15/7 for therapy.  *See Care Plan and progress notes for long and short-term goals.   Revisions to Treatment Plan:  CT of head Change to 15/7 therapy schedule   Teaching Needs: Safety, medications, transfers, toileting, etc.   Current Barriers to Discharge: Behavior  Possible Resolutions to Barriers: Family education     Medical Summary Current Status: severe headache, HTN, overweight  Barriers to Discharge: Medical stability  Barriers to Discharge Comments: severe headache, HTN, overweight Possible Resolutions to [employer] Focus: fioricet ordered, topamax increaesd, CT head ordered, ice packs, increase Zebta back to 5mg , nimodipine d/ced, provided dietary education   Continued Need for Acute Rehabilitation Level of Care: The patient requires daily medical management by a physician with specialized training in physical medicine and rehabilitation  for the following reasons: Direction of a multidisciplinary physical rehabilitation program to maximize functional independence : Yes Medical management of patient stability for increased activity during participation in an intensive rehabilitation regime.: Yes Analysis of laboratory values and/or radiology reports with any subsequent need for medication adjustment and/or medical intervention. : Yes   I attest that I was present, lead the team conference, and concur  with the assessment and plan of the team.   Chana Bode B 07/03/2023, 3:32 PM

## 2023-07-03 NOTE — Progress Notes (Signed)
PROGRESS NOTE   Subjective/Complaints: No new complaints this morning Patient's chart reviewed- No issues reported overnight Vitals signs stable   ROS: +headache, +insomnia Denies CP/SOB/Fever/chills/new motor or sensory changes  Objective:   No results found. Recent Labs    07/01/23 0713  WBC 7.6  HGB 11.5*  HCT 36.5  PLT 329   Recent Labs    07/01/23 0713  NA 135  K 3.9  CL 105  CO2 23  GLUCOSE 111*  BUN 12  CREATININE 0.74  CALCIUM 9.0   No intake or output data in the 24 hours ending 07/03/23 0940       Physical Exam: Vital Signs Blood pressure (!) 147/72, pulse 75, temperature 97.9 F (36.6 C), temperature source Oral, resp. rate 16, weight 56.6 kg, SpO2 100%. Gen: no distress, normal appearing, BMI 27.98 HEENT: oral mucosa pink and moist, NCAT Cardio: Bradycardia Chest: CTAB, normal effort, normal rate of breathing Abd: s soft, nontender, nondistended, normal active bowel sounds Ext: no edema Psych: pleasant, normal affect Skin: Clean and intact without signs of breakdown Small area of redness on right wrist-improved Neuro: Alert and awake, oriented to month and location Left upper extremity strength grossly 4 out of 5 Right upper extremity strength grossly 4- out of 5 Bilateral lower extremity strength grossly 4 out of 5 MMT limited by difficulty following commands Sensation intact to light touch in all 4 extremities Max A for attention Musculoskeletal:  No joint swelling or tenderness noted No abnormal tone appreciated     Assessment/Plan: 1. Functional deficits which require 3+ hours per day of interdisciplinary therapy in a comprehensive inpatient rehab setting. Physiatrist is providing close team supervision and 24 hour management of active medical problems listed below. Physiatrist and rehab team continue to assess barriers to discharge/monitor patient progress toward functional and  medical goals  Care Tool:  Bathing    Body parts bathed by patient: Right arm, Left arm, Chest, Abdomen, Front perineal area, Right upper leg, Left upper leg, Face   Body parts bathed by helper: Buttocks, Left lower leg, Right lower leg     Bathing assist Assist Level: Moderate Assistance - Patient 50 - 74%     Upper Body Dressing/Undressing Upper body dressing   What is the patient wearing?: Pull over shirt    Upper body assist Assist Level: Minimal Assistance - Patient > 75%    Lower Body Dressing/Undressing Lower body dressing      What is the patient wearing?: Incontinence brief, Pants     Lower body assist Assist for lower body dressing: Maximal Assistance - Patient 25 - 49%     Toileting Toileting Toileting Activity did not occur (Clothing management and hygiene only): N/A (no void or bm)  Toileting assist Assist for toileting: Minimal Assistance - Patient > 75%     Transfers Chair/bed transfer  Transfers assist     Chair/bed transfer assist level: Contact Guard/Touching assist     Locomotion Ambulation   Ambulation assist      Assist level: Minimal Assistance - Patient > 75% Assistive device: Walker-Eva Max distance: 11ft   Walk 10 feet activity   Assist     Assist  level: Minimal Assistance - Patient > 75% Assistive device: Walker-Eva   Walk 50 feet activity   Assist Walk 50 feet with 2 turns activity did not occur: Safety/medical concerns  Assist level: Minimal Assistance - Patient > 75% Assistive device: Walker-Eva    Walk 150 feet activity   Assist Walk 150 feet activity did not occur: Safety/medical concerns         Walk 10 feet on uneven surface  activity   Assist Walk 10 feet on uneven surfaces activity did not occur: Safety/medical concerns         Wheelchair     Assist Is the patient using a wheelchair?: Yes Type of Wheelchair: Manual    Wheelchair assist level: Dependent - Patient 0% Max  wheelchair distance: 150 ft    Wheelchair 50 feet with 2 turns activity    Assist        Assist Level: Dependent - Patient 0%   Wheelchair 150 feet activity     Assist      Assist Level: Dependent - Patient 0%   Blood pressure (!) 147/72, pulse 75, temperature 97.9 F (36.6 C), temperature source Oral, resp. rate 16, weight 56.6 kg, SpO2 100%.  Medical Problem List and Plan: 1. 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  Antithrombotics: -DVT/anticoagulation:  Pharmaceutical: Heparin             -antiplatelet therapy: Aspirin and Plavix for three weeks followed by aspirin alone   3. Headaches:Increase topamax to 100mg  HS. Tylenol as needed   Will start Fioricet as needed.  If headache worsens could consider CT scan of the head  Continue gabapentin 100mg  TID   4. Insomnia: increase topamax to 100mg  HS             -continue Rozerem 8 mg q HS             -continue Zoloft 25 mg daily             -continue trazodone 100 mg q HS             -antipsychotic agents: n/a   5. Neuropsych/cognition: This patient is not capable of making decisions on her own behalf.   6. Skin/Wound Care: Routine skin care checks   7. Fluids/Electrolytes/Nutrition: Routine Is and Os and follow-up chemistries             -vegetarian diet/dysphagia 3; continue Ensure             -Dysphagia- SLP eval, monitor p.o. intake   8: Hypertension: monitor TID and prn             -increase Zebta back to 5mg  daily      07/03/2023    7:39 AM 07/03/2023    4:02 AM  07/02/2023    7:52 PM  Vitals with BMI  Weight 124 lbs 12 oz    BMI 27.99    Systolic  147 144  Diastolic  72 63  Pulse  75 83    9: Hyperlipidemia: continue statin   10: SAH: continue nimodipine 60 mg q 4 hours through 9/09   11: Urinary retention: continue Flomax             -observe voiding and consider d/c  -9/7-8 Has been continent, recent bladder scan is not particularly elevated-continue to monitor     12: Loose stool: started on Florastor   13: Aspiration pneumonia: abx completed; history of asthma.              -continue Brovana and Pulmicort nebulizers BID             -consider MDI in place of nebs             -FV/IS   14: Prediabetes: A1c = 5.8%             -CBGs improving off TF and steroids (check BID), provided list of foods for improved blood sugar control   15: Leukocytosis: improving; steroids may have been contributing; Unasyn completed 9/05; remains afebrile             -9/8 improved on last CBC to 9.6, recheck tomorrow   16: Acute anemia of critical illness: discussed that hemoglobin is improving  17: Acute diastolic CHF/garde 1 diastolic dysfunction/pulm edema/elevated BNP             -received Lasix, EF =  70-75%             -daily weight, monitor for signs of fluid overload  -weight monitored and stable  Filed Weights   07/01/23 0500 07/02/23 0343 07/03/23 0739  Weight: 54.8 kg 54.2 kg 56.6 kg      18.  Acute delirium.  Improving.  Continue trazodone 100 mg at bedtime.  19. Overweight: encouraged avoiding added sugars in drinks, d/c Ensure between meals  20. Hypoalbuminemia: encouraged whole food sources of protein  21. Bradycardia: resolved, increase Zebta back to 5mg  daily, messaged nursing to check on urination to see if flomax can be weaned  22. Headache: increase topamax to 50mg  HS, continue Fioricet, discussed with nursing applying ice to forehead prn  23. Hyperglycemia: d/c Ensure between meals   LOS: 6 days A FACE TO FACE  EVALUATION WAS PERFORMED  Yolanda [name] 07/03/2023, 9:40 AM

## 2023-07-03 NOTE — Progress Notes (Signed)
Physical Therapy Session Note  Patient Details  Name: Yolanda [name] MRN: [id number] Date of Birth: 10/04/1933  Today's Date: 07/03/2023 PT Individual Time: 0900-0943 PT Individual Time Calculation (min): 43 min  and Today's Date: 07/03/2023 PT Missed Time: 17 Minutes Missed Time Reason: Pain;Patient fatigue  Short Term Goals: Week 1:  PT Short Term Goal 1 (Week 1): Pt will ambulate 30 ft with assist PT Short Term Goal 2 (Week 1): Pt will tolerate upright positioning >1 hour PT Short Term Goal 3 (Week 1): Pt will perform bed mobility with supervision consistently  Skilled Therapeutic Interventions/Progress Updates:      Pt lying in bed on her stomach. Her DIL at the bedside and RN giving medications for her headache. Discussed with DIL while meds being passed that patient is making slower than anticipated progress in therapies with the headache being the primary barrier. Discussed team conference this AM and will need to consider earlier than anticipated LOS due to lack of participation and progress - DIL understanding and confirms home setup of level entry 1 lvl home with caregiver available 4-5 hours/day. Discussed likely learned helplessness from caregiver assist and likelihood that patient will recover better and be more comfortable in her own home environment. DIL understanding.  Bed mobility completed without assist, cues for initiation only. DIL assisted with donning pants and socks. Sit<>stand with B HHA and CGA/minA for initiation and stability. Ambulated from her room to ortho rehab gym, ~165ft, with B HHA and CGA/minA for stability and guiding. Seated rest break on mat table and then proceeded to complete car transfer with car height replicating their CR-V - patient needing minA for car transfer and wanted to "crawl" into the car rather than safely turning to sit. Educated with daughter the safe technique to approach car and recommend low vehicle due to patient's height (4'8). Patient  then assisted onto the Nustep and attempted to initiate this task but patient reporting too severe of a headache to continue. So she ambulated back to her room with similar assist and distance as above. Patient climbing into bed with CGA. Alarm on anda ll needs met. She missed the last 17 minutes of therapy due to fatigue and headache pain.   Therapy Documentation Precautions:  Precautions Precautions: Fall Precaution Comments: SBP goal <150 Restrictions Weight Bearing Restrictions: No General:     Therapy/Group: Individual Therapy  Orrin Brigham 07/03/2023, 7:41 AM

## 2023-07-03 NOTE — Plan of Care (Signed)
Pt's plan of care adjusted to 15/7 after speaking with care team and discussed with MD in team conference as pt currently unable to tolerate current therapy schedule with OT, PT, and SLP.   

## 2023-07-03 NOTE — Progress Notes (Signed)
Occupational Therapy Session Note  Patient Details  Name: Yolanda [name] MRN: [id number] Date of Birth: 10-14-1933  Today's Date: 07/03/2023 OT Individual Time: 1050-1130 OT Individual Time Calculation (min): 40 min    Short Term Goals: Week 1:  OT Short Term Goal 1 (Week 1): Patient will indicate need to void and transfer to commode with min assist OT Short Term Goal 2 (Week 1): Patient will don socks with mod assist OT Short Term Goal 3 (Week 1): Patient will sequence bathing and dressing with minimal cueing OT Short Term Goal 4 (Week 1): Patient will don pull over dress/ shirt with set up assistance OT Short Term Goal 5 (Week 1): Patient will utilize RUE as dominant for grooming or feeding tasks 25%/time  Skilled Therapeutic Interventions/Progress Updates:  Skilled OT intervention completed with focus on activity tolerance, self-care, functional transfers, dynamic balance. Pt received in prone in bed, with DIL present. Pt remained minimally verbal during session. Pt difficult to arouse but once alert was reluctant but agreeable to session. Headache reported however no number provided. Pre-medicated. OT offered rest breaks, distraction and repositioning throughout for pain reduction.  Pt put one hand on her headache consistently during session and with all mobility, but declined dimming of lights, sunglasses or any other intervention. BP assessed 120/68 in sitting, denying dizziness. Mod-max cues needed for sequencing and safety including sitting up in her w/c as pt on 2 accounts attempted to lay down in chair and needed CGA/min A to block both knees and scoot back into chair.  Transitioned to EOB with supervision with bed flat, and supervision scooting to EOB. Donned grip socks total A. CGA sit > stand with HHA, however pt seeking additional hand of DIL therefore +2 HHA for stand pivot > w/c.  Seated at sink, pt completed oral care and facial hygiene with again max cues for sequencing, but  utilized RUE functionally without cues; overall min A to open toothpaste. Pt declined bathing/dressing.  Transported dependently in w/c <> gym for energy conservation. Pt participated in the following dynamic standing balance and endurance tasks to promote independence and safety during BADLs and functional mobility: -placing and retrieving squigz from long mirror. CGA sit > stand with HHA, and min A needed for balance as pt with anterior lean and pt self-seeking holding onto mirror, almost leaning into mirror with minimal correction despite cues; able to stand for about 5 mins prior to self-initiating sitting  Back in room, CGA sit > stand then urgent need to void. Transported dependently for time. Min A stand pivot with max cues > toilet. NT present for direct care handoff due to OT time constraint.   Therapy Documentation Precautions:  Precautions Precautions: Fall Precaution Comments: SBP goal <150 Restrictions Weight Bearing Restrictions: No    Therapy/Group: Individual Therapy  Melvyn Novas, MS, OTR/L  07/03/2023, 1:48 PM

## 2023-07-03 NOTE — Progress Notes (Signed)
SLP Cancellation Note  Patient Details Name: Yolanda [name] MRN: [id number] DOB: 07/27/1933   Cancelled treatment:        SLP attempted to conduct skilled therapy session targeting dysphagia and cognitive retraining goals. Patient continuing to report headache with rocking, moaning, and grabbing towards head behaviors as previously observed during recent therapy sessions across disciplines. Via daughter-in-law as interpreter, patient reported she was not feeling up to therapy this afternoon. SLP will reattempt per plan of care.                                                                                              Jeannie Done, M.A., CCC-SLP   Yetta Barre 07/03/2023, 2:37 PM

## 2023-07-04 MED ORDER — TOPIRAMATE 25 MG PO TABS
100.0000 mg | ORAL_TABLET | Freq: Every day | ORAL | Status: DC
  Administered 2023-07-04: 100 mg via ORAL
  Filled 2023-07-04: qty 4

## 2023-07-04 NOTE — Progress Notes (Addendum)
PROGRESS NOTE   Subjective/Complaints: No new complaints this morning Discussed that CT head is stable Discussed d/c tomorrow Discussed that we will send her home with her same pain medications for her headache  ROS: +headache- continues, +insomnia. Denies CP/SOB/Fever/chills/new motor or sensory changes  Objective:   CT HEAD WO CONTRAST ( )  Result Date: 07/03/2023 CLINICAL DATA:  Headache, increasing frequency or severity EXAM: CT HEAD WITHOUT CONTRAST TECHNIQUE: Contiguous axial images were obtained from the base of the skull through the vertex without intravenous contrast. RADIATION DOSE REDUCTION: This exam was performed according to the departmental dose-optimization program which includes automated exposure control, adjustment of the mA and/or kV according to patient size and/or use of iterative reconstruction technique. COMPARISON:  CT Head 06/12/23 FINDINGS: Brain: There is a likely small volume subarachnoid hemorrhage along the left cerebral convexity (series 3, image [id number]). No hydrocephalus. No extra-axial fluid collection. No evidence of intraventricular blood products. No CT evidence of an acute cortical infarct. Partially empty sella. 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 Skull: Normal. Negative for fracture or focal lesion. Sinuses/Orbits: 2 no middle ear or mastoid effusion. Paranasal sinuses are notable for polypoid mucosal thickening in the left maxillary sinus. Orbits are unremarkable. Other: None. IMPRESSION: 1. Small volume residual subarachnoid hemorrhage along the left cerebral convexity. This has decreased compared to 06/10/23 2. 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 Electronically Signed   By: Lorenza Cambridge M.D.   On: 07/03/2023 14:26   No results for input(s): "WBC", "HGB", "HCT", "PLT" in the last 72 hours.  No results for input(s): "NA",  "K", "CL", "CO2", "GLUCOSE", "BUN", "CREATININE", "CALCIUM" in the last 72 hours.   Intake/Output Summary (Last 24 hours) at 07/04/2023 1125 Last data filed at 07/04/2023 0750 Gross per 24 hour  Intake 960 ml  Output --  Net 960 ml         Physical Exam: Vital Signs Blood pressure (!) 140/74, pulse 70, temperature 97.6 F (36.4 C), temperature source Oral, resp. rate 18, weight 56.9 kg, SpO2 92%. Gen: no distress, normal appearing, BMI 28.12 HEENT: oral mucosa pink and moist, NCAT Cardio: Bradycardia Chest: CTAB, normal effort, normal rate of breathing Abd: s soft, nontender, nondistended, normal active bowel sounds Ext: no edema Psych: pleasant, normal affect Skin: Clean and intact without signs of breakdown Small area of redness on right wrist-improved Neuro: Alert and awake, oriented to month and location Left upper extremity strength grossly 4 out of 5 Right upper extremity strength grossly 4- out of 5 Bilateral lower extremity strength grossly 4 out of 5 MMT limited by difficulty following commands Sensation intact to light touch in all 4 extremities Max A for attention Musculoskeletal:  No joint swelling or tenderness noted No abnormal tone appreciated     Assessment/Plan: 1. Functional deficits which require 3+ hours per day of interdisciplinary therapy in a comprehensive inpatient rehab setting. Physiatrist is providing close team supervision and 24 hour management of active medical problems listed below. Physiatrist and rehab team continue to assess barriers to discharge/monitor patient progress toward functional and medical goals  Care Tool:  Bathing    Body parts bathed by  patient: Right arm, Left arm, Chest, Abdomen, Front perineal area, Right upper leg, Left upper leg, Face, Buttocks, Left lower leg, Right lower leg   Body parts bathed by helper: Buttocks, Left lower leg, Right lower leg     Bathing assist Assist Level: Supervision/Verbal cueing      Upper Body Dressing/Undressing Upper body dressing   What is the patient wearing?: Pull over shirt    Upper body assist Assist Level: Supervision/Verbal cueing    Lower Body Dressing/Undressing Lower body dressing      What is the patient wearing?: Pants     Lower body assist Assist for lower body dressing: Supervision/Verbal cueing     Toileting Toileting Toileting Activity did not occur (Clothing management and hygiene only): N/A (no void or bm)  Toileting assist Assist for toileting: Supervision/Verbal cueing     Transfers Chair/bed transfer  Transfers assist     Chair/bed transfer assist level: Contact Guard/Touching assist     Locomotion Ambulation   Ambulation assist      Assist level: Minimal Assistance - Patient > 75% Assistive device: Hand held assist Max distance: 150FT   Walk 10 feet activity   Assist     Assist level: Minimal Assistance - Patient > 75% Assistive device: Hand held assist   Walk 50 feet activity   Assist Walk 50 feet with 2 turns activity did not occur: Safety/medical concerns  Assist level: Minimal Assistance - Patient > 75% Assistive device: Hand held assist    Walk 150 feet activity   Assist Walk 150 feet activity did not occur: Safety/medical concerns  Assist level: Minimal Assistance - Patient > 75% Assistive device: Hand held assist    Walk 10 feet on uneven surface  activity   Assist Walk 10 feet on uneven surfaces activity did not occur: Safety/medical concerns         Wheelchair     Assist Is the patient using a wheelchair?: Yes Type of Wheelchair: Manual    Wheelchair assist level: Dependent - Patient 0% Max wheelchair distance: 150 ft    Wheelchair 50 feet with 2 turns activity    Assist        Assist Level: Dependent - Patient 0%   Wheelchair 150 feet activity     Assist      Assist Level: Dependent - Patient 0%   Blood pressure (!) 140/74, pulse 70, temperature  97.6 F (36.4 C), temperature source Oral, resp. rate 18, weight 56.9 kg, SpO2 92%.  Medical Problem List and Plan: 1. Functional deficits secondary to subarachnoid hemorrhage/aneurysm rupture status post coil embolization of left MCA aneurysm             -patient may  shower             -ELOS/Goals: 10-12 days, PT/OT/SLP supervision goals             -Continue CIR (PT, OT, SLP)- therapy notes reviewed   -discussed that we would call son regarding estimated discharge date on Wednesday after team conference  Changed to 15/7 due to difficulty tolerating therapy due to her severe headaches  Discussed plan for d/c tomorrow  2.  Antithrombotics: -DVT/anticoagulation:  Pharmaceutical: Heparin   3. Headaches:Increase topamax to 100mg  HS. Tylenol as needed   Will start Fioricet as needed.  If headache worsens could consider CT scan of the head  Continue gabapentin 100mg  TID   4. Insomnia: increase topamax to 100mg  HS             -  continue Rozerem 8 mg q HS             -continue Zoloft 25 mg daily             -continue trazodone 100 mg q HS             -antipsychotic agents: n/a   5. Neuropsych/cognition: This patient is not capable of making decisions on her own behalf.   6. Skin/Wound Care: Routine skin care checks   7. Fluids/Electrolytes/Nutrition: Routine Is and Os and follow-up chemistries             -vegetarian diet/dysphagia 3; continue Ensure             -Dysphagia- SLP eval, monitor p.o. intake   8: Hypertension: monitor TID and prn             -increase Zebta back to 5mg  daily      07/04/2023    8:18 AM 07/04/2023    6:03 AM 07/04/2023    3:54 AM  Vitals with BMI  Weight  125 lbs 7 oz   BMI  28.14   Systolic   140  Diastolic   74  Pulse 70  70    9: Hyperlipidemia: continue statin   10: SAH: continue nimodipine 60 mg q 4 hours through 9/09   11: Urinary retention: continue Flomax             -observe voiding and consider d/c  -9/7-8 Has been continent, recent  bladder scan is not particularly elevated-continue to monitor     12: Loose stool: started on Florastor   13: Aspiration pneumonia: abx completed; history of asthma.              -continue Brovana and Pulmicort nebulizers BID             -consider MDI in place of nebs             -FV/IS   14: Prediabetes: A1c = 5.8%             -CBGs improving off TF and steroids (check BID), provided list of foods for improved blood sugar control   15: Leukocytosis: improving; steroids may have been contributing; Unasyn completed 9/05; remains afebrile             -9/8 improved on last CBC to 9.6, recheck tomorrow   16: Acute anemia of critical illness: discussed that hemoglobin is improving  17: Acute diastolic CHF/garde 1 diastolic dysfunction/pulm edema/elevated BNP             -received Lasix, EF =  70-75%             -daily weight, monitor for signs of fluid overload  -weight monitored and stable  Filed Weights   07/02/23 0343 07/03/23 0739 07/04/23 0603  Weight: 54.2 kg 56.6 kg 56.9 kg      18.  Acute delirium.  Improving.  Continue trazodone 100 mg at bedtime.  19. Overweight: encouraged avoiding added sugars in drinks, d/c Ensure between meals  20. Hypoalbuminemia: encouraged whole food sources of protein  21. Bradycardia: resolved, increase Zebta back to 5mg  daily, messaged nursing to check on urination to see if flomax can be weaned  22. Headache: increase topamax to 100mg  HS, continue Fioricet, discussed with nursing applying ice to forehead prn  23. Hyperglycemia: d/c Ensure between meals   LOS: 7 days A FACE TO FACE EVALUATION WAS PERFORMED  Yolanda [name] 07/04/2023, 11:25  AM

## 2023-07-04 NOTE — Progress Notes (Signed)
Physical Therapy Discharge Summary  Patient Details  Name: Yolanda [name] MRN: [id number] Date of Birth: 1933-05-21  Date of Discharge from PT service:July 04, 2023  Today's Date: 07/04/2023 PT Individual Time: 0915-0945 PT Individual Time Calculation (min): 30 min  and Today's Date: 07/04/2023 PT Missed Time: 15 Minutes Missed Time Reason: Pain;Patient fatigue   Patient has met 2 of 7 long term goals due to improved activity tolerance, improved balance, ability to compensate for deficits, and functional use of  right upper extremity and right lower extremity.  Patient to discharge at an ambulatory level Min Assist.   Patient's care partner is independent to provide the necessary physical and cognitive assistance at discharge.  Reasons goals not met: Goals set to supervision/CGA however patient required grossly minA for functional mobility. Barriers to progression to goal level are cognition, language/cultural barrier, and her severe headaches that limited her participation in therapy sessions. Family trained with hands on care and demonstrated appropriate ability to manage patient at home.  Recommendation:  Patient will benefit from ongoing skilled PT services in home health setting to continue to advance safe functional mobility, address ongoing impairments in functional mobility, caregiver training, home safety, and minimize fall risk.  Equipment: Transport wheelchair  Reasons for discharge: discharge from hospital  Patient/family agrees with progress made and goals achieved: Yes  PT Discharge Precautions/Restrictions Precautions Precautions: Fall Restrictions Weight Bearing Restrictions: No Pain Pain Assessment Pain Scale: Faces Faces Pain Scale: Hurts even more Pain Type: Acute pain Pain Location: Head Pain Orientation: Right;Anterior Pain Descriptors / Indicators: Headache Pain Onset: On-going Pain Intervention(s): Medication (See eMAR);Repositioned;Environmental  changes;Emotional support;Ambulation/increased activity;Therapeutic touch Multiple Pain Sites: No Pain Interference Pain Interference Pain Effect on Sleep: 4. Almost constantly Pain Interference with Therapy Activities: 4. Almost constantly Pain Interference with Day-to-Day Activities: 4. Almost constantly Vision/Perception  Vision - History Ability to See in Adequate Light: 0 Adequate Vision - Assessment Additional Comments: R inattention Perception Perception: Impaired Preception Impairment Details: Inattention/Neglect Perception-Other Comments: Right inattention Praxis Praxis: WFL  Cognition Overall Cognitive Status: Difficult to assess Arousal/Alertness: Lethargic Attention: Sustained Focused Attention: Appears intact Focused Attention Impairment: Functional basic Sustained Attention: Impaired Sustained Attention Impairment: Functional basic Memory: Impaired Memory Impairment: Storage deficit;Retrieval deficit;Decreased recall of new information Decreased Short Term Memory: Functional basic Awareness: Impaired Awareness Impairment: Intellectual impairment Problem Solving: Impaired Problem Solving Impairment: Functional basic Executive Function: Initiating;Self Monitoring Initiating: Impaired Initiating Impairment: Functional basic Self Monitoring: Impaired Self Monitoring Impairment: Functional basic Behaviors: Perseveration Safety/Judgment: Impaired Sensation Sensation Light Touch: Impaired by gross assessment Hot/Cold: Not tested Proprioception: Impaired by gross assessment Stereognosis: Not tested Coordination Gross Motor Movements are Fluid and Coordinated: No Fine Motor Movements are Fluid and Coordinated: No Coordination and Movement Description: Sluggish gross motor movement Rsided weakness (hemiparesis) Finger Nose Finger Test: undershoots R>L Motor  Motor Motor: Hemiplegia;Abnormal postural alignment and control;Motor impersistence Motor - Skilled  Clinical Observations: mild right sided weakness/ inattention Motor - Discharge Observations: mild right sided weakness/ inattention  Mobility Bed Mobility Bed Mobility: Rolling Right;Rolling Left;Supine to Sit;Sit to Supine Rolling Right: Supervision/verbal cueing Rolling Left: Supervision/Verbal cueing Supine to Sit: Supervision/Verbal cueing Sit to Supine: Supervision/Verbal cueing Transfers Transfers: Sit to Stand;Stand to Sit;Stand Pivot Transfers Sit to Stand: Contact Guard/Touching assist Stand to Sit: Contact Guard/Touching assist Stand Pivot Transfers: Minimal Assistance - Patient > 75% Stand Pivot Transfer Details: Tactile cues for initiation;Tactile cues for posture;Verbal cues for gait pattern;Verbal cues for safe use of DME/AE;Verbal cues for precautions/safety Transfer (Assistive device): 1 person  hand held assist Locomotion  Gait Ambulation: Yes Gait Assistance: Minimal Assistance - Patient > 75% Gait Distance (Feet): 150 Feet Assistive device: 1 person hand held assist Gait Assistance Details: Manual facilitation for weight shifting;Manual facilitation for placement;Tactile cues for initiation;Verbal cues for gait pattern;Verbal cues for precautions/safety Gait Gait: Yes Gait Pattern: Impaired Gait Pattern: Step-through pattern;Right flexed knee in stance;Left flexed knee in stance;Wide base of support;Decreased hip/knee flexion - right;Decreased hip/knee flexion - left (B genu varus) Stairs / Additional Locomotion Stairs: Yes Stairs Assistance: Minimal Assistance - Patient > 75% Stair Management Technique: Two rails;Forwards;Step to pattern Number of Stairs: 4 Height of Stairs: 6 Pick up small object from the floor assist level: Maximal Assistance - Patient 25 - 49% Wheelchair Mobility Wheelchair Mobility: Yes Wheelchair Assistance: Dependent - Patient 0% Wheelchair Parts Management: Needs assistance  Trunk/Postural Assessment  Cervical Assessment Cervical  Assessment: Exceptions to [facility] (forward head) Thoracic Assessment Thoracic Assessment: Exceptions to [facility] (rounded shoulders) Lumbar Assessment Lumbar Assessment: Exceptions to [facility] (posteriorly tilted) Postural Control Postural Control: Deficits on evaluation Protective Responses: premature with forward weight shsift, delayed to R  Balance Balance Balance Assessed: Yes Static Sitting Balance Static Sitting - Balance Support: No upper extremity supported;Feet supported Static Sitting - Level of Assistance: 7: Independent Dynamic Sitting Balance Dynamic Sitting - Balance Support: No upper extremity supported;Feet supported;During functional activity Dynamic Sitting - Level of Assistance: 5: Stand by assistance Static Standing Balance Static Standing - Balance Support: Left upper extremity supported;Right upper extremity supported;During functional activity Static Standing - Level of Assistance: Other (comment) (CGA) Dynamic Standing Balance Dynamic Standing - Balance Support: Right upper extremity supported;Left upper extremity supported;During functional activity Dynamic Standing - Level of Assistance: 4: Min assist Extremity Assessment  RUE Assessment RUE Assessment: Exceptions to [facility] Active Range of Motion (AROM) Comments: Proximal weakness - shoulder, distal weakness - hand General Strength Comments: NT RUE Body System: Neuro Brunstrum levels for arm and hand: Arm;Hand Brunstrum level for arm: Stage IV Movement is deviating from synergy Brunstrum level for hand: Stage V Independence from basic synergies LUE Assessment LUE Assessment: Within Functional Limits RLE Assessment RLE Assessment: Exceptions to [facility] General Strength Comments: Grossly 4-/5 LLE Assessment LLE Assessment: Exceptions to [facility] General Strength Comments: Grossly 4/5  Skilled Intervention: Pt in the bathroom with NT assisting with care. DIL at the bedside, as well as the interpreter. Interpreter leaves during  session but DIL can assist with interpreting. Session focused on DC planning, home safety, managing patient, fall prevention, etc to prepare for tomorrow DC home. DIL without questions - reviewed transport chair management (folding, seat belt, brakes, leg rests, etc) with teach back method. Reviewed stand pivot transfers with the DIL who provided assist with PT supervising. Pt continues to report severe HA but was premedicated by nursing. Patient transported outside for fresh air and change of environment to help boost mood and improve symptoms. Continued to educate DIL while outside on follow up care, DME, recovery process, and home management. Patient returned upstairs to CIR floor - offered further training on car transfers and short distance gait but patient refused due her headache. DIL assisted with getting patient back to bed with PT supervising for safety. All needs met at end, she missed the last 15 minutes of therapy due to fatigue and pain.    Berenis Corter P Bexlee Bergdoll PT 07/04/2023, 9:56 AM

## 2023-07-04 NOTE — Progress Notes (Signed)
Inpatient Rehabilitation Discharge Medication Review by a Pharmacist  A complete drug regimen review was completed for this patient to identify any potential clinically significant medication issues.  High Risk Drug Classes Is patient taking? Indication by Medication  Antipsychotic No   Anticoagulant No   Antibiotic No   Opioid No   Antiplatelet No   Hypoglycemics/insulin No   Vasoactive Medication Yes Zebeta, Flomax- HTN, urinary hesitancy  Chemotherapy No   Other Yes Zoloft- MDD Trazodone- sleep Rozerem- sleep Topamax,gabapentin, Fioricet PRN- HA  Vit D- supplement      Type of Medication Issue Identified Description of Issue Recommendation(s)  Drug Interaction(s) (clinically significant)     Duplicate Therapy     Allergy     No Medication Administration End Date     Incorrect Dose     Additional Drug Therapy Needed     Significant med changes from prior encounter (inform family/care partners about these prior to discharge).    Other       Clinically significant medication issues were identified that warrant physician communication and completion of prescribed/recommended actions by midnight of the next day:  No    Time spent performing this drug regimen review (minutes):  30   Jani Gravel, PharmD Clinical Pharmacist  07/05/2023 9:06 AM

## 2023-07-04 NOTE — Plan of Care (Signed)
  Problem: RH Awareness Goal: LTG: Patient will demonstrate awareness during functional activites type of (SLP) Description: LTG: Patient will demonstrate awareness during functional activites type of (SLP) Outcome: Not Met (requires max-totalA)   Problem: RH Swallowing Goal: LTG Patient will consume least restrictive diet using compensatory strategies with assistance (SLP) Description: LTG:  Patient will consume least restrictive diet using compensatory strategies with assistance (SLP) Outcome: Completed/Met   Problem: RH Cognition - SLP Goal: RH LTG Patient will demonstrate orientation with cues Description:  LTG:  Patient will demonstrate orientation to person/place/time/situation with cues (SLP)   Outcome: Completed/Met   Problem: RH Memory Goal: LTG Patient will demonstrate ability for day to day (SLP) Description: LTG:   Patient will demonstrate ability for day to day recall/carryover during cognitive/linguistic activities with assist  (SLP) Outcome: Completed/Met

## 2023-07-04 NOTE — Progress Notes (Addendum)
Speech Language Pathology Discharge Summary  Patient Details  Name: Yolanda [name] MRN: [id number] Date of Birth: October 22, 1933  Date of Discharge from SLP service:July 04, 2023  Today's Date: 07/04/2023 SLP Individual Time: 4132-4401 SLP Individual Time Calculation (min): 22 min  Skilled Therapeutic Interventions:  SLP conducted skilled therapy session targeting cognitive retraining and family education. Patient greeted again laying on stomach, holding head with significant headache pain. Majority of session was spent on family education as patient requested to only participate in therapy tasks she could complete while lying down. Patient oriented to general place, month, and general situation but disoriented to specifics of reason for hospital admission. SLP attempted to facilitate sequencing/basic problem solving task with patient unwilling to participate d/t headache pain. RN entered mid-session to administer pain medications which patient swallowed alongside thin liquids with no overt s/sx of penetration/aspiration. During session, SLP spoke with daughter-in-law who lives with patient at baseline. SLP discussed current cognitive deficits and patient status compared to pre-admission. DIL reports no changes compared to baseline and supports that she feels adequately prepared with information to take patient home. Patient appropriate for discharge from SLP services at this time.   Patient has met 3 of 4 long term goals.  Patient to discharge at overall Mod level.  Reasons goals not met: baseline cognitive impairments   Clinical Impression/Discharge Summary: Patient made modest gains towards therapy goals this admission meeting 3/4 long term goals set for this admission. Patient currently oriented to basic information re: person, place, time, and situation given modA. At most recent session, patient recalled basic events from the morning with 75% accuracy given minA. Patient continues to tolerate  Dys3/thin liquids diet and whole meds administered with thin liquids. Patient does demonstrate reduced awareness of deficits, however presents with dementia at baseline and this is likely pre-admission status. Patient would benefit from home health SLP service evaluation to determine need for further cognitive therapy in functional home setting. Patient and family education complete. Patient appropriate for discharge from SLP services.    Care Partner:  Caregiver Able to Provide Assistance: Yes  Type of Caregiver Assistance: Cognitive;Physical  Recommendation:  Home Health SLP  Rationale for SLP Follow Up: Maximize cognitive function and independence   Equipment: n/a   Reasons for discharge: Discharged from hospital   Patient/Family Agrees with Progress Made and Goals Achieved: Yes   Jeannie Done, M.A., CCC-SLP  Yetta Barre 07/04/2023, 2:03 PM

## 2023-07-04 NOTE — Progress Notes (Signed)
Occupational Therapy Discharge Summary  Patient Details  Name: Yolanda [name] MRN: [id number] Date of Birth: 11-23-1932  Date of Discharge from OT service:July 04, 2023  Today's Date: 07/04/2023 OT Individual Time: 8657-8469 OT Individual Time Calculation (min): 45 min   Skilled Therapeutic Intervention: Patient received prone in bed with covers over her head.  Patient's oldest son present, no live interpreter.  Attempted to connect with audio interpreter without success.  Son asking to leave session.  Called younger brother - who does speak [place] and explained situation.  Son aware that plan is to bring patient home tomorrow, and that she will need some light assistance with bathing, dressing, toileting due to pain and lethargy, as well as mild right sided weakness.  Son on phone expressed understanding.   Patient's daughter and daughter in law typically assist with personal care for patient.   Patient agreeable to sit at edge of bed, then walk to bathroom for continent void of bowel and bladder.  NT made aware.  Patient then washed at sink and changed into clean clothing with supervision and gestural cueing.  Live interpreter now present.  Patient holding head, moaning, and closing eyes throughout session.  Indicates very high pain, but that she had taken pain medicine.  Patient assisted back to bed, lights turned off, covered patient.     Family education regarding set up for [facility] and uses, as well as set up for tub transfer bench (TTB) and demonstration of TTB transfer. All questions asked and answered.  Reset bed alarm, and made sure call bell in reach.  Interpreter who is known to patient and family at bedside.  Patient has met 9 of 12 long term goals due to improved activity tolerance, improved balance, functional use of  RIGHT upper and RIGHT lower extremity, and improved attention.  Patient to discharge at [facility] Assist level.  Patient's care partner is independent to provide the  necessary physical and cognitive assistance at discharge.    Reasons goals not met: Patient leaving earlier than expected as it is felt she may do better in home versus rehab environment.  Patient limited by headache and fatigue although both are slowly improving.    Recommendation:  Patient will benefit from ongoing skilled OT services in home health setting to continue to advance functional skills in the area of BADL and Reduce care partner burden.  Equipment: Bedside commode, tub transfer bench  Reasons for discharge: treatment goals met and discharge from hospital  Patient/family agrees with progress made and goals achieved: Yes  OT Discharge Precautions/Restrictions  Precautions Precautions: Fall Restrictions Weight Bearing Restrictions: No General OT Amount of Missed Time: 30 Minutes Vital Signs Oxygen Therapy O2 Device: Room Air Pain Pain Assessment Pain Scale: Faces Faces Pain Scale: Hurts even more Pain Type: Acute pain Pain Location: Head Pain Orientation: Right;Anterior Pain Descriptors / Indicators: Headache Pain Onset: On-going Pain Intervention(s): Medication (See eMAR);Repositioned;Environmental changes;Emotional support;Ambulation/increased activity;Therapeutic touch Multiple Pain Sites: No ADL ADL Eating: Minimal assistance (able to feed self - assist more due to low motivation) Where Assessed-Eating: Edge of bed Grooming: Supervision/safety Where Assessed-Grooming: Sitting at sink, Standing at sink Upper Body Bathing: Supervision/safety Where Assessed-Upper Body Bathing: Sitting at sink Lower Body Bathing: Supervision/safety Where Assessed-Lower Body Bathing: Sitting at sink, Standing at sink Upper Body Dressing: Supervision/safety Where Assessed-Upper Body Dressing: Sitting at sink Lower Body Dressing: Supervision/safety Where Assessed-Lower Body Dressing: Sitting at sink Toileting: Supervision/safety Where Assessed-Toileting: Public house manager: Furniture conservator/restorer Method: [employer]  Transfer Equipment: Gaffer: Unable to assess Tub/Shower Transfer Method: Unable to assess Tub/Shower Equipment: Insurance underwriter: Administrator, arts Method: Designer, industrial/product: Information systems manager with back Vision Baseline Vision/History: 0 No visual deficits Patient Visual Report: Eye fatigue/eye pain/headache Vision Assessment?: Vision impaired- to be further tested in functional context Additional Comments: mild right inattention - difficulty maintaining eyes open Perception  Perception: Impaired Perception-Other Comments: Right inattention Praxis Praxis: WFL Cognition Cognition Overall Cognitive Status: Impaired/Different from baseline Arousal/Alertness: Lethargic Orientation Level: Person;Place;Situation Person: Oriented Place: Oriented Situation: Oriented Memory: Impaired Memory Impairment: Storage deficit;Retrieval deficit Decreased Short Term Memory: Functional basic Attention: Sustained Focused Attention: Appears intact Focused Attention Impairment: Functional basic Sustained Attention: Impaired Sustained Attention Impairment: Functional basic Awareness: Impaired Awareness Impairment: Intellectual impairment Problem Solving: Impaired Problem Solving Impairment: Functional basic Executive Function: Initiating;Self Monitoring Initiating: Impaired Initiating Impairment: Functional basic Self Monitoring: Impaired Self Monitoring Impairment: Functional basic Behaviors: Perseveration Safety/Judgment: Impaired Brief Interview for Mental Status (BIMS) Repetition of Three Words (First Attempt): None Temporal Orientation: Year: No answer Temporal Orientation: Month: No answer Temporal Orientation: Day: No answer Recall: "Sock": No answer Recall: "Blue": No answer Recall: "Bed": No answer BIMS Summary Score:  99 Sensation Sensation Light Touch: Impaired by gross assessment Hot/Cold: Not tested Proprioception: Impaired by gross assessment Stereognosis: Not tested Coordination Gross Motor Movements are Fluid and Coordinated: No Fine Motor Movements are Fluid and Coordinated: No Coordination and Movement Description: Sluggish gross motor movement Rsided weakness (hemiparesis) Finger Nose Finger Test: undershoots R>L Motor  Motor Motor: Hemiplegia;Abnormal postural alignment and control;Motor impersistence Motor - Skilled Clinical Observations: mild right sided weakness/ inattention Motor - Discharge Observations: mild right sided weakness/ inattention Mobility  Bed Mobility Bed Mobility: Rolling Right;Rolling Left;Supine to Sit;Sit to Supine Rolling Right: Supervision/verbal cueing Rolling Left: Supervision/Verbal cueing Supine to Sit: Supervision/Verbal cueing Sit to Supine: Supervision/Verbal cueing Transfers Sit to Stand: Supervision/Verbal cueing Stand to Sit: Supervision/Verbal cueing  Trunk/Postural Assessment  Cervical Assessment Cervical Assessment: Exceptions to [facility] (holds head forward) Thoracic Assessment Thoracic Assessment: Exceptions to [facility] (flexes trunk forward) Lumbar Assessment Lumbar Assessment: Exceptions to [facility] (posterior pelvic tilt) Postural Control Postural Control: Deficits on evaluation Protective Responses: premature with forward weight shsift, delayed to R  Balance Balance Balance Assessed: Yes Static Sitting Balance Static Sitting - Balance Support: No upper extremity supported;Feet supported Static Sitting - Level of Assistance: 7: Independent Dynamic Sitting Balance Dynamic Sitting - Balance Support: No upper extremity supported;Feet supported;During functional activity Dynamic Sitting - Level of Assistance: 5: Stand by assistance Static Standing Balance Static Standing - Balance Support: Left upper extremity supported;Right upper extremity  supported;During functional activity Static Standing - Level of Assistance: 5: Stand by assistance Dynamic Standing Balance Dynamic Standing - Balance Support: Right upper extremity supported;Left upper extremity supported;During functional activity Dynamic Standing - Level of Assistance: 5: Stand by assistance Extremity/Trunk Assessment RUE Assessment RUE Assessment: Exceptions to [facility] Active Range of Motion (AROM) Comments: Proximal weakness - shoulder, distal weakness - hand General Strength Comments: NT RUE Body System: Neuro Brunstrum levels for arm and hand: Arm;Hand Brunstrum level for arm: Stage IV Movement is deviating from synergy Brunstrum level for hand: Stage V Independence from basic synergies LUE Assessment LUE Assessment: Within Functional Limits   Collier Salina 07/04/2023, 8:19 AM

## 2023-07-04 NOTE — Discharge Instructions (Addendum)
Inpatient Rehab Discharge Instructions  Yolanda [name] Discharge date and time: 07/05/2023   Activities/Precautions/ Functional Status: Activity: no lifting, driving, or strenuous exercise until cleared by MD Diet: cardiac diet, carb modified Wound Care: none needed Functional status:  ___ No restrictions     ___ Walk up steps independently _x__ 24/7 supervision/assistance   ___ Walk up steps with assistance ___ Intermittent supervision/assistance  ___ Bathe/dress independently ___ Walk with walker     ___ Bathe/dress with assistance ___ Walk Independently    ___ Shower independently ___ Walk with assistance    __x_ Shower with assistance _x__ No alcohol     ___ Return to work/school ________  Special Instructions:  No driving, alcohol consumption or tobacco use.  Recommend daily BP measurement in same arm and record time of day. Bring this information with you to follow-up appointment with PCP.   COMMUNITY REFERRALS UPON DISCHARGE:    Home Health:   PT   OT   SP                Agency:[facility] Phone: [phone]    Medical Equipment/Items Ordered: 3 IN 1, TUB BENCH AND TRANSPORT CHAIR                                                 Agency/Supplier:[facility]   [phone]   STROKE/TIA DISCHARGE INSTRUCTIONS SMOKING Cigarette smoking nearly doubles your risk of having a stroke & is the single most alterable risk factor  If you smoke or have smoked in the last 12 months, you are advised to quit smoking for your health. Most of the excess cardiovascular risk related to smoking disappears within a year of stopping. Ask you doctor about anti-smoking medications [place] Quit Line: 1-800-QUIT NOW Free Smoking Cessation Classes [phone]  CHOLESTEROL Know your levels; limit fat & cholesterol in your diet  Lipid Panel     Component Value Date/Time   CHOL 188 06/11/2023 0457   TRIG 147 06/11/2023 0457   HDL 27 (L) 06/11/2023 0457   CHOLHDL 7.0 06/11/2023 0457   VLDL 29  06/11/2023 0457   LDLCALC 132 (H) 06/11/2023 0457     Many patients benefit from treatment even if their cholesterol is at goal. Goal: Total Cholesterol (CHOL) less than 160 Goal:  Triglycerides (TRIG) less than 150 Goal:  HDL greater than 40 Goal:  LDL (LDLCALC) less than 100   BLOOD PRESSURE American Stroke Association blood pressure target is less that 120/80 mm/Hg  Your discharge blood pressure is:  BP: 124/70 Monitor your blood pressure Limit your salt and alcohol intake Many individuals will require more than one medication for high blood pressure  DIABETES (A1c is a blood sugar average for last 3 months) Goal HGBA1c is under 7% (HBGA1c is blood sugar average for last 3 months)  Diabetes: Diagnosis of diabetes:  Your A1c:5.8 %    Lab Results  Component Value Date   HGBA1C 5.8 (H) 06/12/2023    Your HGBA1c can be lowered with medications, healthy diet, and exercise. Check your blood sugar as directed by your physician Call your physician if you experience unexplained or low blood sugars.  PHYSICAL ACTIVITY/REHABILITATION Goal is 30 minutes at least 4 days per week  Activity: Increase activity slowly, Therapies: Physical Therapy: Home Health, Occupational Therapy: Home Health, and Speech Therapy: Home Health Return to  work: n/a Activity decreases your risk of heart attack and stroke and makes your heart stronger.  It helps control your weight and blood pressure; helps you relax and can improve your mood. Participate in a regular exercise program. Talk with your doctor about the best form of exercise for you (dancing, walking, swimming, cycling).  DIET/WEIGHT Goal is to maintain a healthy weight  Your discharge diet is:  Diet Order             DIET DYS 3 Room service appropriate? Yes with Assist; Fluid consistency: Thin  Diet effective now                  thin liquids Your height is:    Your current weight is: Weight: 56.5 kg Your Body Mass Index (BMI) is:  BMI  (Calculated): 27.94 Following the type of diet specifically designed for you will help prevent another stroke. Your goal weight range is:   Your goal Body Mass Index (BMI) is 19-24. Healthy food habits can help reduce 3 risk factors for stroke:  High cholesterol, hypertension, and excess weight.  RESOURCES Stroke/Support Group:  Call [phone]   STROKE EDUCATION PROVIDED/REVIEWED AND GIVEN TO PATIENT Stroke warning signs and symptoms How to activate emergency medical system (call 911). Medications prescribed at discharge. Need for follow-up after discharge. Personal risk factors for stroke. Pneumonia vaccine given: No Flu vaccine given: No My questions have been answered, the writing is legible, and I understand these instructions.  I will adhere to these goals & educational materials that have been provided to me after my discharge from the hospital.     My questions have been answered and I understand these instructions. I will adhere to these goals and the provided educational materials after my discharge from the hospital.  Patient/Caregiver Signature _______________________________ Date __________  Clinician Signature _______________________________________ Date __________  Please bring this form and your medication list with you to all your follow-up doctor's appointments.

## 2023-07-04 NOTE — Progress Notes (Signed)
Inpatient Rehabilitation Care Coordinator Discharge Note   Patient Details  Name: Yolanda [name] MRN: [id number] Date of Birth: [birth year]   Discharge location: HOME WITH SON AND DAUGHTER IN-[place] WHO CAN PROVIDE 24/7 CARE  Length of Stay: 8 DAYS  Discharge activity level: CGA LEVEL  Home/community participation: HOME BOUND  Patient response WU:JWJXBJ Literacy - How often do you need to have someone help you when you read instructions, pamphlets, or other written material from your doctor or pharmacy?: Always  Patient response YN:WGNFAO Isolation - How often do you feel lonely or isolated from those around you?: Patient unable to respond  Services provided included: MD, RD, PT, OT, SLP, RN, CM, Pharmacy, SW  Financial Services:  Financial Services Utilized: Medicare    Choices offered to/list presented to: PT AND DAUGHER [place]  Follow-up services arranged:  Home Health, DME, Patient/Family has no preference for HH/DME agencies [facility] Health Agency: [facility]  PT  OT  SP    DME : [facility] TRANSPORT [facility]AIR, 3 IN 1 AND TUB BENCH  HAS CAREGIVER M-F 5 HOURS PER DAY-PCS WILL RESUME  Patient response to transportation need: Is the patient able to respond to transportation needs?: Other (comment) (SON REPORTED) In the past 12 months, has lack of transportation kept you from medical appointments or from getting medications?: No In the past 12 months, has lack of transportation kept you from meetings, work, or from getting things needed for daily living?: No   Patient/Family verbalized understanding of follow-up arrangements:  Yes  Individual responsible for coordination of the follow-up plan: KUMAR-SON [phone]  Confirmed correct DME delivered: Lucy Chris 07/04/2023    Comments (or additional information):FAMILY WAS HERE DAILY TO PROVIDE SUPPORT AND SEE HER LEVEL OF CARE. WITH PT'S DEMENTIA SHE NEEDED ASSIST PRIOR TO ADMISSION.   Summary of Stay    Date/Time  Discharge Planning CSW  07/03/23 1132 HOme with family members and 5 hour cargiver. Aware will need 24/7 care at discharge. Will make discharge arrangements once know DME and HH needs RGD       Jkayla Spiewak, Lemar Livings

## 2023-07-05 ENCOUNTER — Other Ambulatory Visit (HOSPITAL_COMMUNITY): Payer: Self-pay

## 2023-07-05 ENCOUNTER — Telehealth (HOSPITAL_COMMUNITY): Payer: Self-pay

## 2023-07-05 MED ORDER — TRAZODONE HCL 100 MG PO TABS
100.0000 mg | ORAL_TABLET | Freq: Every day | ORAL | 0 refills | Status: AC
  Filled 2023-07-05: qty 30, 30d supply, fill #0

## 2023-07-05 MED ORDER — BISOPROLOL FUMARATE 5 MG PO TABS
5.0000 mg | ORAL_TABLET | Freq: Every day | ORAL | 0 refills | Status: AC
  Filled 2023-07-05: qty 30, 30d supply, fill #0

## 2023-07-05 MED ORDER — VITAMIN D (ERGOCALCIFEROL) 1.25 MG (50000 UNIT) PO CAPS
50000.0000 [IU] | ORAL_CAPSULE | ORAL | 0 refills | Status: AC
  Filled 2023-07-05: qty 5, 35d supply, fill #0

## 2023-07-05 MED ORDER — GABAPENTIN 100 MG PO CAPS
100.0000 mg | ORAL_CAPSULE | Freq: Three times a day (TID) | ORAL | 0 refills | Status: AC
  Filled 2023-07-05: qty 90, 30d supply, fill #0

## 2023-07-05 MED ORDER — RAMELTEON 8 MG PO TABS
8.0000 mg | ORAL_TABLET | Freq: Every day | ORAL | 0 refills | Status: AC
  Filled 2023-07-05: qty 30, 30d supply, fill #0

## 2023-07-05 MED ORDER — ACETAMINOPHEN 325 MG PO TABS: 650.0000 mg | ORAL_TABLET | Freq: Three times a day (TID) | ORAL | Status: AC | PRN

## 2023-07-05 MED ORDER — BUTALBITAL-APAP-CAFFEINE 50-325-40 MG PO TABS
1.0000 | ORAL_TABLET | Freq: Four times a day (QID) | ORAL | 0 refills | Status: AC | PRN
  Filled 2023-07-05: qty 21, 6d supply, fill #0

## 2023-07-05 MED ORDER — SERTRALINE HCL 25 MG PO TABS
25.0000 mg | ORAL_TABLET | Freq: Every day | ORAL | 0 refills | Status: DC
  Filled 2023-07-05: qty 30, 30d supply, fill #0

## 2023-07-05 MED ORDER — TAMSULOSIN HCL 0.4 MG PO CAPS
0.4000 mg | ORAL_CAPSULE | Freq: Every day | ORAL | 0 refills | Status: AC
  Filled 2023-07-05: qty 30, 30d supply, fill #0

## 2023-07-05 MED ORDER — TOPIRAMATE 100 MG PO TABS
100.0000 mg | ORAL_TABLET | Freq: Every day | ORAL | 0 refills | Status: AC
  Filled 2023-07-05: qty 30, 30d supply, fill #0

## 2023-07-05 NOTE — Progress Notes (Signed)
Patient ID: Yolanda [name], female   DOB: 03/22/1933, 89 y.o.   MRN: [id number]  SW covering for primary SW, Becky Dupree.  SW met with pt and pt family in room to discuss family has the option to purchase RW if needed as insurance will only cover the transport chair. Family had no questions/concerns.   Cecile Sheerer, MSW, LCSWA Office: [phone] Cell: [phone] Fax: [phone]

## 2023-07-05 NOTE — Telephone Encounter (Signed)
Pharmacy Patient Advocate Encounter   Received notification from  In patient  that prior authorization for Ramelteon 8mg  tabs is required/requested.   Insurance verification completed.   The patient is insured through [employer] .   Per test claim: PA required; PA submitted to [facility] via CoverMyMeds Key/confirmation #/EOC [id number] Status is pending

## 2023-07-05 NOTE — Telephone Encounter (Signed)
Pharmacy Patient Advocate Encounter  Received notification from [facility] that Prior Authorization for Ramelteon has been APPROVED from 10/22/2022 to 10/22/2023. Ran test claim, Copay is $1.55. This test claim was processed through [facility]- copay amounts may vary at other pharmacies due to pharmacy/plan contracts, or as the patient moves through the different stages of their insurance plan.   PA #/Case ID/Reference #: [id number]

## 2023-07-05 NOTE — Progress Notes (Signed)
PROGRESS NOTE   Subjective/Complaints: No new complaints this morning Discussed with son that today we will go over her current medications, follow-ups, and that we will send a 30 day supply of all medications except for the opioids  ROS: +headache- continue, +insomnia. Denies CP/SOB/Fever/chills/new motor or sensory changes  Objective:   CT HEAD WO CONTRAST ( )  Result Date: 07/03/2023 CLINICAL DATA:  Headache, increasing frequency or severity EXAM: CT HEAD WITHOUT CONTRAST TECHNIQUE: Contiguous axial images were obtained from the base of the skull through the vertex without intravenous contrast. RADIATION DOSE REDUCTION: This exam was performed according to the departmental dose-optimization program which includes automated exposure control, adjustment of the mA and/or kV according to patient size and/or use of iterative reconstruction technique. COMPARISON:  CT Head 06/12/23 FINDINGS: Brain: There is a likely small volume subarachnoid hemorrhage along the left cerebral convexity (series 3, image [id number]). No hydrocephalus. No extra-axial fluid collection. No evidence of intraventricular blood products. No CT evidence of an acute cortical infarct. Partially empty sella. 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 Skull: Normal. Negative for fracture or focal lesion. Sinuses/Orbits: 2 no middle ear or mastoid effusion. Paranasal sinuses are notable for polypoid mucosal thickening in the left maxillary sinus. Orbits are unremarkable. Other: None. IMPRESSION: 1. Small volume residual subarachnoid hemorrhage along the left cerebral convexity. This has decreased compared to 06/10/23 2. 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 Electronically Signed   By: Lorenza Cambridge M.D.   On: 07/03/2023 14:26   No results for input(s): "WBC", "HGB", "HCT", "PLT" in the last 72 hours.  No results for  input(s): "NA", "K", "CL", "CO2", "GLUCOSE", "BUN", "CREATININE", "CALCIUM" in the last 72 hours.   Intake/Output Summary (Last 24 hours) at 07/05/2023 1045 Last data filed at 07/04/2023 1305 Gross per 24 hour  Intake 240 ml  Output --  Net 240 ml         Physical Exam: Vital Signs Blood pressure 124/70, pulse 74, temperature 97.9 F (36.6 C), temperature source Oral, resp. rate 16, weight 56.5 kg, SpO2 100%. Gen: no distress, normal appearing, BMI 28.12 HEENT: oral mucosa pink and moist, NCAT Cardio: Bradycardia Gen: no distress, normal appearing HEENT: oral mucosa pink and moist, NCAT Cardio: Reg rate Chest: normal effort, normal rate of breathing Abd: soft, non-distended Ext: no edema Psych: pleasant, normal affect Skin: Clean and intact without signs of breakdown Small area of redness on right wrist-improved Neuro: Alert and awake, oriented to month and location Left upper extremity strength grossly 4 out of 5 Right upper extremity strength grossly 4- out of 5 Bilateral lower extremity strength grossly 4 out of 5 MMT limited by difficulty following commands Sensation intact to light touch in all 4 extremities Max A for attention Musculoskeletal:  No joint swelling or tenderness noted No abnormal tone appreciated     Assessment/Plan: 1. Functional deficits which require 3+ hours per day of interdisciplinary therapy in a comprehensive inpatient rehab setting. Physiatrist is providing close team supervision and 24 hour management of active medical problems listed below. Physiatrist and rehab team continue to assess barriers to discharge/monitor patient progress toward functional and medical goals  Care Tool:  Bathing    Body parts bathed by patient: Right arm, Left arm, Chest, Abdomen, Front perineal area, Right upper leg, Left upper leg, Face, Buttocks, Left lower leg, Right lower leg   Body parts bathed by helper: Buttocks, Left lower leg, Right lower leg      Bathing assist Assist Level: Supervision/Verbal cueing     Upper Body Dressing/Undressing Upper body dressing   What is the patient wearing?: Pull over shirt    Upper body assist Assist Level: Supervision/Verbal cueing    Lower Body Dressing/Undressing Lower body dressing      What is the patient wearing?: Pants     Lower body assist Assist for lower body dressing: Supervision/Verbal cueing     Toileting Toileting Toileting Activity did not occur (Clothing management and hygiene only): N/A (no void or bm)  Toileting assist Assist for toileting: Supervision/Verbal cueing     Transfers Chair/bed transfer  Transfers assist     Chair/bed transfer assist level: Contact Guard/Touching assist     Locomotion Ambulation   Ambulation assist      Assist level: Minimal Assistance - Patient > 75% Assistive device: Hand held assist Max distance: 150FT   Walk 10 feet activity   Assist     Assist level: Minimal Assistance - Patient > 75% Assistive device: Hand held assist   Walk 50 feet activity   Assist Walk 50 feet with 2 turns activity did not occur: Safety/medical concerns  Assist level: Minimal Assistance - Patient > 75% Assistive device: Hand held assist    Walk 150 feet activity   Assist Walk 150 feet activity did not occur: Safety/medical concerns  Assist level: Minimal Assistance - Patient > 75% Assistive device: Hand held assist    Walk 10 feet on uneven surface  activity   Assist Walk 10 feet on uneven surfaces activity did not occur: Safety/medical concerns         Wheelchair     Assist Is the patient using a wheelchair?: Yes Type of Wheelchair: Manual    Wheelchair assist level: Dependent - Patient 0% Max wheelchair distance: 150 ft    Wheelchair 50 feet with 2 turns activity    Assist        Assist Level: Dependent - Patient 0%   Wheelchair 150 feet activity     Assist      Assist Level: Dependent -  Patient 0%   Blood pressure 124/70, pulse 74, temperature 97.9 F (36.6 C), temperature source Oral, resp. rate 16, weight 56.5 kg, SpO2 100%.  Medical Problem List and Plan: 1. Functional deficits secondary to subarachnoid hemorrhage/aneurysm rupture status post coil embolization of left MCA aneurysm             -patient may  shower             -ELOS/Goals: 10-12 days, PT/OT/SLP supervision goals             -Continue CIR (PT, OT, SLP)- therapy notes reviewed   -discussed that we would call son regarding estimated discharge date on Wednesday after team conference  Changed to 15/7 due to difficulty tolerating therapy due to her severe headaches  Discussed plan for d/c today  2.  Antithrombotics: -DVT/anticoagulation:  Pharmaceutical: Heparin   3. Headaches:Increase topamax to 100mg  HS. Tylenol as needed   Will start Fioricet as needed.  If headache worsens could consider CT scan of the head  Continue gabapentin 100mg  TID   4. Insomnia: continue topamax to  100mg  HS             -continue Rozerem 8 mg q HS             -continue Zoloft 25 mg daily             -continue trazodone 100 mg q HS             -antipsychotic agents: n/a   5. Neuropsych/cognition: This patient is not capable of making decisions on her own behalf.   6. Skin/Wound Care: Routine skin care checks   7. Fluids/Electrolytes/Nutrition: Routine Is and Os and follow-up chemistries             -vegetarian diet/dysphagia 3; continue Ensure             -Dysphagia- SLP eval, monitor p.o. intake   8: Hypertension: monitor TID and prn             -increase Zebta back to 5mg  daily, continue this dose      07/05/2023    8:36 AM 07/05/2023    5:59 AM 07/05/2023    3:52 AM  Vitals with BMI  Weight  124 lbs 9 oz   BMI  27.94   Systolic   124  Diastolic   70  Pulse 74  73    9: Hyperlipidemia: continue statin   10: SAH: continue nimodipine 60 mg q 4 hours through 9/09   11: Urinary retention: continue Flomax              -observe voiding and consider d/c  -9/7-8 Has been continent, recent bladder scan is not particularly elevated-continue to monitor     12: Loose stool: started on Florastor   13: Aspiration pneumonia: abx completed; history of asthma.              -continue Brovana and Pulmicort nebulizers BID             -consider MDI in place of nebs             -FV/IS   14: Prediabetes: A1c = 5.8%             -CBGs improving off TF and steroids (check BID), provided list of foods for improved blood sugar control   15: Leukocytosis: improving; steroids may have been contributing; Unasyn completed 9/05; remains afebrile             -9/8 improved on last CBC to 9.6, recheck tomorrow   16: Acute anemia of critical illness: discussed that hemoglobin is improving  17: Acute diastolic CHF/garde 1 diastolic dysfunction/pulm edema/elevated BNP             -received Lasix, EF =  70-75%             -daily weight, monitor for signs of fluid overload  -weight monitored and stable  Filed Weights   07/03/23 0739 07/04/23 0603 07/05/23 0559  Weight: 56.6 kg 56.9 kg 56.5 kg      18.  Acute delirium.  Improving.  Continue trazodone 100 mg at bedtime.  19. Overweight: encouraged avoiding added sugars in drinks, d/c Ensure between meals  20. Hypoalbuminemia: encouraged whole food sources of protein  21. Bradycardia: resolved, increase Zebta back to 5mg  daily, messaged nursing to check on urination to see if flomax can be weaned  22. Headache: continue topamax to 100mg  HS, continue Fioricet, discussed with nursing applying ice to forehead prn  23. Hyperglycemia: d/c Ensure between meals   >  30 minutes spent in discharge of patient including review of medications and follow-up appointments, physical examination, and in answering all patient's questions    LOS: 8 days A FACE TO FACE EVALUATION WAS PERFORMED  [place] P Domino Holten 07/05/2023, 10:45 AM

## 2023-07-06 ENCOUNTER — Inpatient Hospital Stay (HOSPITAL_COMMUNITY)
Admission: EM | Admit: 2023-07-06 | Discharge: 2023-07-10 | DRG: 064 | Disposition: A | Discharge status: Home Health | Payer: Medicare Other | Attending: Internal Medicine | Admitting: Internal Medicine

## 2023-07-06 ENCOUNTER — Emergency Department (HOSPITAL_COMMUNITY): Payer: Medicare Other

## 2023-07-06 ENCOUNTER — Encounter (HOSPITAL_COMMUNITY): Payer: Self-pay | Admitting: Emergency Medicine

## 2023-07-06 ENCOUNTER — Inpatient Hospital Stay (HOSPITAL_COMMUNITY): Payer: Medicare Other

## 2023-07-06 ENCOUNTER — Observation Stay (HOSPITAL_COMMUNITY): Payer: Medicare Other

## 2023-07-06 DIAGNOSIS — I639 Cerebral infarction, unspecified: Secondary | ICD-10-CM | POA: Diagnosis not present

## 2023-07-06 DIAGNOSIS — I10 Essential (primary) hypertension: Secondary | ICD-10-CM | POA: Diagnosis not present

## 2023-07-06 DIAGNOSIS — R29719 NIHSS score 19: Secondary | ICD-10-CM | POA: Diagnosis present

## 2023-07-06 DIAGNOSIS — Z79899 Other long term (current) drug therapy: Secondary | ICD-10-CM

## 2023-07-06 DIAGNOSIS — Z515 Encounter for palliative care: Secondary | ICD-10-CM | POA: Diagnosis not present

## 2023-07-06 DIAGNOSIS — E785 Hyperlipidemia, unspecified: Secondary | ICD-10-CM | POA: Diagnosis present

## 2023-07-06 DIAGNOSIS — Z603 Acculturation difficulty: Secondary | ICD-10-CM | POA: Diagnosis present

## 2023-07-06 DIAGNOSIS — G8191 Hemiplegia, unspecified affecting right dominant side: Secondary | ICD-10-CM | POA: Diagnosis present

## 2023-07-06 DIAGNOSIS — R410 Disorientation, unspecified: Principal | ICD-10-CM

## 2023-07-06 DIAGNOSIS — I69091 Dysphagia following nontraumatic subarachnoid hemorrhage: Secondary | ICD-10-CM | POA: Diagnosis not present

## 2023-07-06 DIAGNOSIS — R569 Unspecified convulsions: Secondary | ICD-10-CM | POA: Diagnosis not present

## 2023-07-06 DIAGNOSIS — G43909 Migraine, unspecified, not intractable, without status migrainosus: Secondary | ICD-10-CM | POA: Diagnosis present

## 2023-07-06 DIAGNOSIS — I6501 Occlusion and stenosis of right vertebral artery: Secondary | ICD-10-CM | POA: Diagnosis present

## 2023-07-06 DIAGNOSIS — I6381 Other cerebral infarction due to occlusion or stenosis of small artery: Principal | ICD-10-CM | POA: Diagnosis present

## 2023-07-06 DIAGNOSIS — I6330 Cerebral infarction due to thrombosis of unspecified cerebral artery: Principal | ICD-10-CM | POA: Diagnosis present

## 2023-07-06 DIAGNOSIS — R29810 Facial weakness: Secondary | ICD-10-CM | POA: Diagnosis present

## 2023-07-06 DIAGNOSIS — E876 Hypokalemia: Secondary | ICD-10-CM | POA: Diagnosis present

## 2023-07-06 DIAGNOSIS — R1310 Dysphagia, unspecified: Secondary | ICD-10-CM | POA: Diagnosis present

## 2023-07-06 DIAGNOSIS — I671 Cerebral aneurysm, nonruptured: Secondary | ICD-10-CM | POA: Diagnosis present

## 2023-07-06 DIAGNOSIS — G9340 Encephalopathy, unspecified: Secondary | ICD-10-CM | POA: Diagnosis not present

## 2023-07-06 DIAGNOSIS — Z8679 Personal history of other diseases of the circulatory system: Secondary | ICD-10-CM

## 2023-07-06 DIAGNOSIS — J4520 Mild intermittent asthma, uncomplicated: Secondary | ICD-10-CM | POA: Diagnosis present

## 2023-07-06 DIAGNOSIS — R339 Retention of urine, unspecified: Secondary | ICD-10-CM | POA: Diagnosis present

## 2023-07-06 DIAGNOSIS — Z9889 Other specified postprocedural states: Secondary | ICD-10-CM | POA: Diagnosis not present

## 2023-07-06 DIAGNOSIS — I63512 Cerebral infarction due to unspecified occlusion or stenosis of left middle cerebral artery: Secondary | ICD-10-CM | POA: Diagnosis not present

## 2023-07-06 DIAGNOSIS — F0390 Unspecified dementia without behavioral disturbance: Secondary | ICD-10-CM | POA: Diagnosis present

## 2023-07-06 DIAGNOSIS — G9341 Metabolic encephalopathy: Secondary | ICD-10-CM | POA: Diagnosis present

## 2023-07-06 DIAGNOSIS — E875 Hyperkalemia: Secondary | ICD-10-CM | POA: Diagnosis not present

## 2023-07-06 DIAGNOSIS — I5032 Chronic diastolic (congestive) heart failure: Secondary | ICD-10-CM | POA: Diagnosis present

## 2023-07-06 DIAGNOSIS — R531 Weakness: Secondary | ICD-10-CM

## 2023-07-06 DIAGNOSIS — Z1152 Encounter for screening for COVID-19: Secondary | ICD-10-CM | POA: Diagnosis not present

## 2023-07-06 DIAGNOSIS — I110 Hypertensive heart disease with heart failure: Secondary | ICD-10-CM | POA: Diagnosis present

## 2023-07-06 DIAGNOSIS — Z66 Do not resuscitate: Secondary | ICD-10-CM | POA: Diagnosis not present

## 2023-07-06 DIAGNOSIS — J690 Pneumonitis due to inhalation of food and vomit: Secondary | ICD-10-CM | POA: Diagnosis not present

## 2023-07-06 DIAGNOSIS — J9811 Atelectasis: Secondary | ICD-10-CM | POA: Diagnosis present

## 2023-07-06 DIAGNOSIS — R4701 Aphasia: Secondary | ICD-10-CM | POA: Diagnosis present

## 2023-07-06 LAB — DIFFERENTIAL
Abs Immature Granulocytes: 0.06 10*3/uL (ref 0.00–0.07)
Basophils Absolute: 0.1 10*3/uL (ref 0.0–0.1)
Basophils Relative: 1 %
Eosinophils Absolute: 0.4 10*3/uL (ref 0.0–0.5)
Eosinophils Relative: 6 %
Immature Granulocytes: 1 %
Lymphocytes Relative: 32 %
Lymphs Abs: 2.1 10*3/uL (ref 0.7–4.0)
Monocytes Absolute: 0.5 10*3/uL (ref 0.1–1.0)
Monocytes Relative: 8 %
Neutro Abs: 3.4 10*3/uL (ref 1.7–7.7)
Neutrophils Relative %: 52 %

## 2023-07-06 LAB — CBC
HCT: 40.1 % (ref 36.0–46.0)
Hemoglobin: 12.4 g/dL (ref 12.0–15.0)
MCH: 28.3 pg (ref 26.0–34.0)
MCHC: 30.9 g/dL (ref 30.0–36.0)
MCV: 91.6 fL (ref 80.0–100.0)
Platelets: 202 10*3/uL (ref 150–400)
RBC: 4.38 MIL/uL (ref 3.87–5.11)
RDW: 15.6 % — ABNORMAL HIGH (ref 11.5–15.5)
WBC: 6.5 10*3/uL (ref 4.0–10.5)
nRBC: 0 % (ref 0.0–0.2)

## 2023-07-06 LAB — I-STAT CHEM 8, ED
BUN: 16 mg/dL (ref 8–23)
Calcium, Ion: 1.18 mmol/L (ref 1.15–1.40)
Chloride: 109 mmol/L (ref 98–111)
Creatinine, Ser: 0.7 mg/dL (ref 0.44–1.00)
Glucose, Bld: 100 mg/dL — ABNORMAL HIGH (ref 70–99)
HCT: 40 % (ref 36.0–46.0)
Hemoglobin: 13.6 g/dL (ref 12.0–15.0)
Potassium: 6 mmol/L — ABNORMAL HIGH (ref 3.5–5.1)
Sodium: 138 mmol/L (ref 135–145)
TCO2: 22 mmol/L (ref 22–32)

## 2023-07-06 LAB — COMPREHENSIVE METABOLIC PANEL
ALT: 29 U/L (ref 0–44)
AST: 20 U/L (ref 15–41)
Albumin: 3.7 g/dL (ref 3.5–5.0)
Alkaline Phosphatase: 80 U/L (ref 38–126)
Anion gap: 11 (ref 5–15)
BUN: 11 mg/dL (ref 8–23)
CO2: 22 mmol/L (ref 22–32)
Calcium: 9.6 mg/dL (ref 8.9–10.3)
Chloride: 105 mmol/L (ref 98–111)
Creatinine, Ser: 0.78 mg/dL (ref 0.44–1.00)
GFR, Estimated: >60 mL/min (ref >60)
Glucose, Bld: 98 mg/dL (ref 70–99)
Potassium: 3.9 mmol/L (ref 3.5–5.1)
Sodium: 138 mmol/L (ref 135–145)
Total Bilirubin: 0.3 mg/dL (ref 0.3–1.2)
Total Protein: 7.4 g/dL (ref 6.5–8.1)

## 2023-07-06 LAB — RESP PANEL BY RT-PCR (RSV, FLU A&B, COVID)  RVPGX2
Influenza A by PCR: NEGATIVE
Influenza B by PCR: NEGATIVE
Resp Syncytial Virus by PCR: NEGATIVE
SARS Coronavirus 2 by RT PCR: NEGATIVE

## 2023-07-06 LAB — ETHANOL: Alcohol, Ethyl (B): <10 mg/dL (ref <10)

## 2023-07-06 LAB — PROTIME-INR
INR: 0.9 (ref 0.8–1.2)
Prothrombin Time: 12.3 s (ref 11.4–15.2)

## 2023-07-06 LAB — CBG MONITORING, ED: Glucose-Capillary: 98 mg/dL (ref 70–99)

## 2023-07-06 LAB — APTT: aPTT: 27 s (ref 24–36)

## 2023-07-06 MED ORDER — ACETAMINOPHEN 325 MG PO TABS
650.0000 mg | ORAL_TABLET | Freq: Four times a day (QID) | ORAL | Status: DC | PRN
  Administered 2023-07-09 – 2023-07-10 (×2): 650 mg via ORAL
  Filled 2023-07-06 (×2): qty 2

## 2023-07-06 MED ORDER — SODIUM CHLORIDE 0.9% FLUSH
3.0000 mL | Freq: Once | INTRAVENOUS | Status: AC
  Administered 2023-07-06: 3 mL via INTRAVENOUS

## 2023-07-06 MED ORDER — VITAMIN D (ERGOCALCIFEROL) 1.25 MG (50000 UNIT) PO CAPS: 50000.0000 [IU] | ORAL_CAPSULE | ORAL | Status: DC

## 2023-07-06 MED ORDER — BUTALBITAL-APAP-CAFFEINE 50-325-40 MG PO TABS: 1.0000 | ORAL_TABLET | Freq: Four times a day (QID) | ORAL | Status: DC | PRN

## 2023-07-06 MED ORDER — ALBUTEROL SULFATE (2.5 MG/3ML) 0.083% IN NEBU: 2.5000 mg | INHALATION_SOLUTION | Freq: Four times a day (QID) | RESPIRATORY_TRACT | Status: DC | PRN

## 2023-07-06 MED ORDER — TOPIRAMATE 25 MG PO TABS
100.0000 mg | ORAL_TABLET | Freq: Every day | ORAL | Status: DC
  Administered 2023-07-08 – 2023-07-09 (×2): 100 mg via ORAL
  Filled 2023-07-06 (×2): qty 4

## 2023-07-06 MED ORDER — LORAZEPAM 2 MG/ML IJ SOLN
2.0000 mg | Freq: Once | INTRAMUSCULAR | Status: AC
  Administered 2023-07-06: 2 mg via INTRAVENOUS
  Filled 2023-07-06: qty 1

## 2023-07-06 MED ORDER — TRAZODONE HCL 50 MG PO TABS
100.0000 mg | ORAL_TABLET | Freq: Every day | ORAL | Status: DC
  Administered 2023-07-08 – 2023-07-09 (×2): 100 mg via ORAL
  Filled 2023-07-06 (×2): qty 2

## 2023-07-06 MED ORDER — SODIUM CHLORIDE 0.9% FLUSH
3.0000 mL | Freq: Two times a day (BID) | INTRAVENOUS | Status: DC
  Administered 2023-07-07 – 2023-07-10 (×4): 3 mL via INTRAVENOUS

## 2023-07-06 MED ORDER — LORAZEPAM 2 MG/ML IJ SOLN
1.0000 mg | Freq: Once | INTRAMUSCULAR | Status: AC
  Administered 2023-07-06: 1 mg via INTRAVENOUS
  Filled 2023-07-06: qty 1

## 2023-07-06 MED ORDER — LEVETIRACETAM IN NACL 1000 MG/100ML IV SOLN
2000.0000 mg | Freq: Once | INTRAVENOUS | Status: AC
  Administered 2023-07-06: 2000 mg via INTRAVENOUS
  Filled 2023-07-06: qty 200

## 2023-07-06 MED ORDER — ACETAMINOPHEN 650 MG RE SUPP: 650.0000 mg | Freq: Four times a day (QID) | RECTAL | Status: DC | PRN

## 2023-07-06 MED ORDER — IOHEXOL 350 MG/ML SOLN
75.0000 mL | Freq: Once | INTRAVENOUS | Status: AC | PRN
  Administered 2023-07-06: 75 mL via INTRAVENOUS

## 2023-07-06 MED ORDER — SERTRALINE HCL 50 MG PO TABS
25.0000 mg | ORAL_TABLET | Freq: Every day | ORAL | Status: DC
  Administered 2023-07-09 – 2023-07-10 (×2): 25 mg via ORAL
  Filled 2023-07-06 (×2): qty 1

## 2023-07-06 MED ORDER — RAMELTEON 8 MG PO TABS
8.0000 mg | ORAL_TABLET | Freq: Every day | ORAL | Status: DC
  Administered 2023-07-08 – 2023-07-09 (×2): 8 mg via ORAL
  Filled 2023-07-06 (×4): qty 1

## 2023-07-06 MED ORDER — TAMSULOSIN HCL 0.4 MG PO CAPS
0.4000 mg | ORAL_CAPSULE | Freq: Every day | ORAL | Status: DC
  Administered 2023-07-08 – 2023-07-09 (×2): 0.4 mg via ORAL
  Filled 2023-07-06 (×2): qty 1

## 2023-07-06 MED ORDER — ENOXAPARIN SODIUM 40 MG/0.4ML IJ SOSY
40.0000 mg | PREFILLED_SYRINGE | INTRAMUSCULAR | Status: DC
  Administered 2023-07-06 – 2023-07-09 (×3): 40 mg via SUBCUTANEOUS
  Filled 2023-07-06 (×3): qty 0.4

## 2023-07-06 NOTE — ED Notes (Signed)
Pt back from MRI at this time

## 2023-07-06 NOTE — ED Triage Notes (Signed)
Pt arrives via EMS from home with reports of LSN 1030. Family took pt to bathroom and at 1040, pt was unable to get up with right sided deficits and facial droop.

## 2023-07-06 NOTE — H&P (Addendum)
History and Physical    Patient: Yolanda [name] QIO:[id number] DOB: 1933-07-19 DOA: 07/06/2023 DOS: the patient was seen and examined on 07/06/2023 PCP: Pcp, No  Patient coming from: Home  Chief Complaint: Altered mental status.  HPI: Yolanda [name] is a 89 y.o. Nepali speaking female with medical history significant of hypertension, asthma, and dementia who presented to the hospital after being noted to be acutely altered.  Patient just recently hospitalized from 8/19-9/5 after presenting with acute onset of headache with nausea, vomiting, and right-sided weakness found to have grade 3 subarachnoid hemorrhage.  CTA showed a left MCA aneurysm , right MCA aneurysm, and right PCA aneurysm.  Neurosurgery had been consulted and patient underwent coiling of the left MCA aneurysm.  She had went to inpatient rehab after her hospitalization and just had been discharged home yesterday.  Patient son is at bedside and gives additional history.  They left the hospital around 12:30 PM and patient was able to walk with assistance home.  This morning she had gotten up, urinated, had breakfast, and then complained of a headache for which she took headache medicine.  There after the patient went to lay down.  Her son had left the house and he was called by his daughter noting that his mother was drooling out of the right side of her mouth.  He came back home to find her not responding with eyes rolling to speak or recognize who he was.  Patient had previously been able to speak earlier this morning.He states that she previously could not move her right arm, but now was unable to.  The therapist who had come to the house were concerned that she had had a stroke for which she came back to the hospital.  He makes note that she has had an intermittent cough since leaving the hospital and had also had some shaking in her left hand.   Patient presented to the emergency department as a code stroke.  Neurology have formally  evaluated the patient.  Patient was noted to be afebrile with relatively stable vital signs.  Labs noted CBC within normal limits.  I-STAT Chem-8 noted potassium to be elevated at 6, but formal CMP was pending.  CT scan of the head did not note any acute abnormality.  Patient was not a thrombolytics candidate due to recent subarachnoid hemorrhage.  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  Patient had been given Ativan IV and subsequently loaded with 2 g of Keppra IV.   Review of Systems: unable to review all systems due to the inability of the patient to answer questions. Past Medical History:  Diagnosis Date   Closed fracture of left proximal humerus 03/27/2021   Closed fracture of lower end of left radius with routine healing 10/31/2020   Dementia (HCC)    Mild intermittent asthma without complication 04/04/2019   OSTEOARTHRITIS 08/04/2010   Annotation: Bilateral knees and ankles Qualifier: Diagnosis of  By: Delrae Alfred MD, Lanora Manis     Past Surgical History:  Procedure Laterality Date   IR 3D INDEPENDENT WKST  06/10/2023   IR ANGIO INTRA EXTRACRAN SEL INTERNAL CAROTID UNI L MOD SED  06/11/2023   IR ANGIOGRAM FOLLOW UP STUDY  06/10/2023   IR ANGIOGRAM FOLLOW UP STUDY  06/10/2023   IR NEURO EACH ADD'L AFTER BASIC UNI LEFT (MS)  06/10/2023   IR TRANSCATH/EMBOLIZ  06/10/2023   RADIOLOGY WITH ANESTHESIA N/A 06/10/2023   Procedure: IR WITH  ANESTHESIA;  Surgeon: Lisbeth Renshaw, MD;  Location: [facility] OR;  Service: Radiology;  Laterality: N/A;   Social History:  reports that she has never smoked. She does not have any smokeless tobacco history on file. She reports that she does not drink alcohol and does not use drugs.  Allergies  Allergen Reactions   Meat Extract Other (See Comments)    Pt vegetarian     History reviewed. No pertinent family history.  Prior to Admission medications   Medication Sig Start Date End Date Taking?  Authorizing Provider  acetaminophen (TYLENOL) 325 MG tablet Take 2 tablets (650 mg total) by mouth 3 (three) times daily as needed. 07/05/23   Setzer, Lynnell Jude, PA-C  bisoprolol (ZEBETA) 5 MG tablet Take 1 tablet (5 mg total) by mouth daily. 07/05/23   Setzer, Lynnell Jude, PA-C  butalbital-acetaminophen-caffeine (FIORICET) 226 [id number] MG tablet Take 1 tablet by mouth every 6 (six) hours as needed for headache. 07/05/23   Setzer, Lynnell Jude, PA-C  gabapentin (NEURONTIN) 100 MG capsule Take 1 capsule (100 mg total) by mouth 3 (three) times daily. 07/05/23   Setzer, Lynnell Jude, PA-C  ramelteon (ROZEREM) 8 MG tablet Take 1 tablet (8 mg total) by mouth at bedtime. 07/05/23   Setzer, Lynnell Jude, PA-C  sertraline (ZOLOFT) 25 MG tablet Take 1 tablet (25 mg total) by mouth daily. 07/05/23   Setzer, Lynnell Jude, PA-C  tamsulosin (FLOMAX) 0.4 MG CAPS capsule Take 1 capsule (0.4 mg total) by mouth daily after supper. 07/05/23   Setzer, Lynnell Jude, PA-C  topiramate (TOPAMAX) 100 MG tablet Take 1 tablet (100 mg total) by mouth at bedtime. 07/05/23   Setzer, Lynnell Jude, PA-C  traZODone (DESYREL) 100 MG tablet Take 1 tablet (100 mg total) by mouth at bedtime. 07/05/23   Setzer, Lynnell Jude, PA-C  Vitamin D, Ergocalciferol, (DRISDOL) 1.25 MG (50000 UNIT) CAPS capsule Take 1 capsule (50,000 Units total) by mouth every 7 (seven) days. Take each Friday until finished. 07/05/23   Milinda Antis, PA-C    Physical Exam: Vitals:   07/06/23 1450 07/06/23 1452 07/06/23 1515 07/06/23 1530  BP:   (!) 154/68 119/87  Pulse:   75 82  Resp:   19 (!) 22  Temp:  (!) 96.9 F (36.1 C)    TempSrc:  Axillary    SpO2:   98% 99%  Weight:      Height: 4\' 8"  (1.422 m)      Constitutional: Elderly female who appears to be lethargic and not really responding to commands at this time but had recently been given Ativan Eyes: PERRL, lids and conjunctivae normal ENMT: Mucous membranes are moist. Posterior pharynx clear of any exudate or lesions.Normal dentition.   Neck: normal, supple, no masses, no thyromegaly Respiratory: clear to auscultation bilaterally, no wheezing, no crackles. Normal respiratory effort. No accessory muscle use.  Cardiovascular: Regular rate and rhythm, no murmurs / rubs / gallops. No extremity edema. 2+ pedal pulses. No carotid bruits.  Abdomen: no tenderness, no masses palpated. Bowel sounds positive.  Musculoskeletal: no clubbing / cyanosis. No joint deformity upper and lower extremities. Good ROM, no contractures. Normal muscle tone.  Skin: no rashes, lesions, ulcers. No induration Neurologic: CN 2-12 grossly intact.  Strength 2/5 in the right upper extremity.  Patient appears able to move all other extremities. Psychiatric: Unable to assess for orientation at this time.  Data Reviewed:  EKG appears to show sinus rhythm at 71 bpm with significant background artifact present.  Reviewed labs, imaging,  and pertinent records as documented in this note.  Assessment and Plan:  CVA Acute/subacute.  Patient presented after being noted to be acutely altered with right-sided facial, right arm weakness, and inability to speak.  MRI revealed acute/subacute infarcts involving the left frontal operculum and basal ganglia.  -Admit to a medical telemetry bed -Seizure precautions -Neurochecks -Check transcranial Dopplers  -Check EEG -PT/OT/speech to eval and treat -Appreciate neurology consultative services we will follow-up for any further recommendations.  Acute encephalopathy Patient was noted to be acutely altered.  Symptoms may all be related to throat.  However on the differential includes possibility of seizure versus infection. -Check respiratory virus panel -Check urinalysis and chest x-ray  Essential hypertension Blood pressures currently maintained.  Neurology recommends normotension and avoiding hypotension at this time. -Hold bisprolol given stroke  History of subarachnoid hemorrhage/aneurysmal rupture Status post  coil embolization of cerebral aneurysm Patient just recently had been admitted into the hospital on 8/19 with a subarachnoid hemorrhage and underwent coil embolization of the left MCA aneurysm with neurosurgery.  She had completed the 21-day course of nimodipine.  CT angiogram head did not show any concern for vasospasm. -Neurosurgery had been consulted and will formally see in a.m.  Headaches -Continue topiramate and Fioricet as needed once able  Hyperkalemia Acute.  Potassium noted to be 6  on i-STAT.  Question hemolysis. -Follow-up CMP  Dysphagia During last hospitalization patient had required a core track, but  had been placed on a dysphagia 3 diet prior to discharge. -N.p.o. until able to pass swallow screen -Speech therapy consulted to formally evaluate  Mild intermittent asthma No wheezing noted on physical exam at this time. -For vomiting budesonide nebs twice daily -Albuterol nebs as needed  Diastolic congestive heart failure Chronic.  Patient does not appear to be fluid overloaded.  Last EF noted to be 70 to 75% with grade 1 diastolic dysfunction on 06/12/2023. -Monitor intake and output -Daily weight  History of urinary retention Patient has been placed on Flomax during previous hospitalization. -Continue Flomax once able   DVT prophylaxis: Lovenox Advance Care Planning:   Code Status: Full Code    Consults: Neurology and neurosurgery  Family Communication: Son updated at bedside  Severity of Illness: The appropriate patient status for this patient is INPATIENT. Inpatient status is judged to be reasonable and necessary in order to provide the required intensity of service to ensure the patient's safety. The patient's presenting symptoms, physical exam findings, and initial radiographic and laboratory data in the context of their chronic comorbidities is felt to place them at high risk for further clinical deterioration. Furthermore, it is not anticipated that the  patient will be medically stable for discharge from the hospital within 2 midnights of admission.   * I certify that at the point of admission it is my clinical judgment that the patient will require inpatient hospital care spanning beyond 2 midnights from the point of admission due to high intensity of service, high risk for further deterioration and high frequency of surveillance required.*  Author: Clydie Braun, MD 07/06/2023 4:15 PM  For on call review [email or web address].

## 2023-07-06 NOTE — Consult Note (Addendum)
Neurology Consultation  Reason for Consult: Code stroke-right arm leg weakness, right facial droop, aphasia Referring Physician: Dr. Trudie Buckler  CC: Code stroke for right arm leg weakness right facial droop and aphasia  History is obtained from: Chart, son over the phone  HPI: Shauniece [name] is a 89 y.o. 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 According to the family, she was able to talk and had breakfast this morning, but was somewhat drowsy-they thought she was tired.  Therapist came to home and figured out that she was not moving her right side.[facility]  EMS was called and brought her as a code stroke. Going by the progress note from the physiatrist yesterday, her right upper extremity strength was documented 4 -/5 but today she is barely 2/5 on the right upper and lower extremity.  This is definitely a change from before.   LKW: Sometime 9 AM per son IV thrombolysis given?: no, recent SAH Premorbid modified Rankin scale (mRS): 3   ROS: Full ROS was performed and is negative except as noted in the HPI.  Past Medical History:  Diagnosis Date   Closed fracture of left proximal humerus 03/27/2021   Closed fracture of lower end of left radius with routine healing 10/31/2020   Dementia (HCC)    Mild intermittent asthma without complication 04/04/2019   OSTEOARTHRITIS 08/04/2010   Annotation: Bilateral knees and ankles Qualifier: Diagnosis of  By: Delrae Alfred MD, Elizabeth      History reviewed. No pertinent family history.   Social History:   reports that she has never smoked. She does not have any smokeless  tobacco history on file. She reports that she does not drink alcohol and does not use drugs.  Medications  Current Facility-Administered Medications:    LORazepam (ATIVAN) injection 2 mg, 2 mg, Intravenous, Once, Milon Dikes, MD  Current Outpatient Medications:    acetaminophen (TYLENOL) 325 MG tablet, Take 2 tablets (650 mg total) by mouth 3 (three) times daily as needed., Disp: , Rfl:    bisoprolol (ZEBETA) 5 MG tablet, Take 1 tablet (5 mg total) by mouth daily., Disp: 30 tablet, Rfl: 0   butalbital-acetaminophen-caffeine (FIORICET) 50-325-40 MG tablet, Take 1 tablet by mouth every 6 (six) hours as needed for headache., Disp: 21 tablet, Rfl: 0   gabapentin (NEURONTIN) 100 MG capsule, Take 1 capsule (100 mg total) by mouth 3 (three) times daily., Disp: 90 capsule, Rfl: 0   ramelteon (ROZEREM) 8 MG tablet, Take 1 tablet (8 mg total) by mouth at bedtime., Disp: 30 tablet, Rfl: 0   sertraline (ZOLOFT) 25 MG tablet, Take 1 tablet (25 mg total) by mouth daily., Disp: 30 tablet, Rfl: 0   tamsulosin (FLOMAX) 0.4 MG CAPS capsule, Take 1 capsule (0.4 mg total) by mouth daily after supper., Disp: 30 capsule, Rfl: 0   topiramate (TOPAMAX) 100 MG tablet, Take 1 tablet (100 mg total) by mouth at bedtime., Disp: 30 tablet, Rfl: 0   traZODone (DESYREL) 100 MG tablet, Take 1 tablet (100 mg total) by mouth at bedtime., Disp: 30 tablet, Rfl: 0   Vitamin D, Ergocalciferol, (DRISDOL) 1.25 MG (50000 UNIT) CAPS capsule, Take 1 capsule (50,000 Units total) by mouth every 7 (seven)  days. Take each Friday until finished., Disp: 5 capsule, Rfl: 0   Exam: Current vital signs: BP 119/87   Pulse 82   Temp (!) 96.9 F (36.1 C) (Axillary)   Resp (!) 22   Ht 4\' 8"  (1.422 m)   Wt 54 kg   SpO2 99%   BMI 26.69 kg/m  Vital signs in last 24 hours: Temp:  [96.9 F (36.1 C)] 96.9 F (36.1 C) (09/14 1452) Pulse Rate:  [75-82] 82 (09/14 1530) Resp:  [17-22] 22 (09/14 1530) BP: (119-154)/(68-87) 119/87 (09/14  1530) SpO2:  [98 %-99 %] 99 % (09/14 1530) Weight:  [54 kg] 54 kg (09/14 1424) Somewhat difficult exam due to language barrier. Nepali interpreter used on the iPad but it was audio only and the connection was poor. Requested one of the hospital colleagues, who speaks fluent Nepali to help-appreciate his help. General: Awake alert complaining of headache. HEENT: Normocephalic atraumatic Lungs clear Cardiovascular: Regular rhythm Neurological examination She is awake alert appears to be in a lot of discomfort due to headache She was able to nod yes and no appropriately but with minimal verbal output. Her speech was moderately dysarthric She was not following commands consistently even in her mother tongue. Cranial nerves: Pupils equal round reactive light, extraocular movements were difficult to assess as she was not following commands consistently but she blink to threat from both sides, face appeared asymmetric with right lower facial weakness. Motor examination with barely 2/5 right upper and lower extremity strength.  Left side was at least 4+/5 in the upper and lower extremity. Sensation also appeared somewhat diminished on the right in comparison to the left as evidenced by withdrawal to noxious stimulation. Coordination was difficult to assess given her mentation.  Labs I have reviewed labs in epic and the results pertinent to this consultation are:  CBC    Component Value Date/Time   WBC 6.5 07/06/2023 1422   RBC 4.38 07/06/2023 1422   HGB 13.6 07/06/2023 1426   HCT 40.0 07/06/2023 1426   PLT 202 07/06/2023 1422   MCV 91.6 07/06/2023 1422   MCH 28.3 07/06/2023 1422   MCHC 30.9 07/06/2023 1422   RDW 15.6 (H) 07/06/2023 1422   LYMPHSABS 2.1 07/06/2023 1422   MONOABS 0.5 07/06/2023 1422   EOSABS 0.4 07/06/2023 1422   BASOSABS 0.1 07/06/2023 1422    CMP     Component Value Date/Time   NA 138 07/06/2023 1426   K 6.0 (H) 07/06/2023 1426   CL 109 07/06/2023 1426   CO2 23  07/01/2023 0713   GLUCOSE 100 (H) 07/06/2023 1426   BUN 16 07/06/2023 1426   CREATININE 0.70 07/06/2023 1426   CALCIUM 9.0 07/01/2023 0713   PROT 6.6 06/28/2023 0622   ALBUMIN 2.7 (L) 06/28/2023 0622   AST 29 06/28/2023 0622   ALT 37 06/28/2023 0622   ALKPHOS 78 06/28/2023 0622   BILITOT 0.9 06/28/2023 0622   GFRNONAA >60 07/01/2023 0713    Lipid Panel     Component Value Date/Time   CHOL 188 06/11/2023 0457   TRIG 147 06/11/2023 0457   HDL 27 (L) 06/11/2023 0457   CHOLHDL 7.0 06/11/2023 0457   VLDL 29 06/11/2023 0457   LDLCALC 132 (H) 06/11/2023 0457    Lab Results  Component Value Date   HGBA1C 5.8 (H) 06/12/2023      Imaging I have reviewed the images obtained:  CT-head: No acute intracranial abnormality.  Coiled left MCA bifurcation aneurysm CT angiography head and  neck shows large coil mass now present at the left MCA bifurcation.  Stable 9 mm right MCA bifurcation aneurysm with a wide neck.  M2 segments originate from the base of the aneurysm.  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  Stable mild atherosclerotic changes at the carotid bifurcations without significant stenosis.  There is patchy airspace opacities posteriorly in the lungs bilaterally right greater than left which may represent atelectasis or infection.  Aortic atherosclerosis is seen. MRI examination of the brain-ordered stat and is pending at this time  Assessment:  89 year old woman past history of dementia, recent subarachnoid hemorrhage secondary to a ruptured left MCA aneurysm status post coil embolization and multiple other aneurysms, developed encephalopathy and agitation while in the hospital discharged from rehab on 07/05/2023 brought back after family noticed sudden onset of right sided weakness-unclear last known well.  Weakness was noticed by the therapist and EMS was called. On arrival she complained of severe  headache.  CT imaging unrevealing.  CT angiography unrevealing for any acute abnormality.  Question patchy airspace disease on the CT angiography images that image the chest.  At this point, seizures with following Todd's paralysis versus toxic metabolic encephalopathy versus delayed cerebral ischemia and vasospasm are in the differentials for her current presentation.  Recommendations: Avoid hypotension-maintain normotension to hypertension in terms of her blood pressure parameters. Stat MRI ordered and pending to evaluate for any acute stroke Will hook her up to LTM EEG to evaluate for any ongoing seizures and possible Todd's paralysis as the etiology of her right-sided weakness. May need consideration for neurosurgical reevaluation for possible angio although CT angiography does not reveal evidence of vasospasm and she is also nearly 4 weeks out from her initial event which is very far out for vasospasm. Symptomatic management of headache-can use migraine cocktail including NSAIDs as the aneurysm has been secured. One-time Ativan followed by Keppra 2 g IV load.  Additional Ativan if needed for MRI has been ordered as needed. Further recommendations based on imaging and clinical course.  May need observation overnight  Plan discussed with Dr. Clarice Pole and patient's son at bedside   Addendum 1729 MRI brain completed.  There is acute to subacute stroke in the left hemisphere-question delayed cerebral ischemia. I will change the LTM EEG to just a routine EEG now that we know that the stroke might be the culprit for her presentation. Symptomatic management of headache. I would also order TCD Neurosurgical consultation-for consideration of repeat cervical angio for vasospasm if deemed appropriate No need to repeat complete stroke workup.  Recently had an echo. Plan was relayed to Dr. Redmond School hospitalist -- Milon Dikes, MD Neurologist [facility] Pager: [phone]

## 2023-07-06 NOTE — Code Documentation (Addendum)
Stroke Response Nurse Documentation Code Documentation  Yolanda [name] is a 89 y.o. female arriving to [facility]  for stroke activation via [facility] with past medical hx of dementia, recent SAH/aneurysm s/p coiling. Patient LKW around 0900 when she was able to eat breakfast with family though she was a little bit drowsy. Right sided weakness was noted when therapy got to the house prompting EMS to be called. Patient was just d/c from CIR yesterday 9/13.   Stroke team at the bedside on patient arrival. Labs drawn and patient cleared for CT by EDP. Patient to CT with team. NIH 19, see flowsheet for details. CT, CTA completed. Patient is not a candidate for IV Thrombolytic due to recent [facility]. Patient is not a candidate for thrombectomy. Care Plan: q2h NIH/vitals. 1mg  ativan and 2g Keppra given IV post CT per MD [place]. Bedside handoff with ED RN Seward Grater.    *Nepali interpreter was used.   Scarlette Slice K  Rapid Response RN

## 2023-07-06 NOTE — ED Provider Notes (Signed)
Yolanda [name] Provider Note   CSN: [id number] Arrival date & time: 07/06/23  1417  An emergency department physician performed an initial assessment on this suspected stroke patient at 57.  History  No chief complaint on file.   Yolanda [name] is a 89 y.o. female.  HPI Patient assessed by myself at the EMS bridge as code stroke with stroke team.  Brief EMS history obtained.  Detailed history obtained by Dr. Jerrell Belfast as follows by conversation over the phone with patient's son.  I later spoke with his son after patient's stroke workup, he describe changes that he observed today.  Following history from Dr. Roxine Caddy evaluation .  Yolanda [name] is a 89 y.o. 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 According to the family, she was able to talk and had breakfast this morning, but was somewhat drowsy-they thought she was tired.  Therapist came to home and figured out that she was not moving her right side.Marland Kitchen  EMS was called and brought her as a code stroke. Going by the progress note from the physiatrist yesterday, her right upper extremity strength was documented 4 -/5 but today she is barely 2/5 on the right upper and lower extremity.  This is definitely a change from before.    Home Medications Prior to Admission medications   Medication Sig Start Date End Date Taking? Authorizing Provider  acetaminophen (TYLENOL) 325 MG tablet Take 2 tablets (650 mg total) by mouth 3 (three) times daily as needed. 07/05/23   Setzer, Lynnell Jude, PA-C  bisoprolol (ZEBETA) 5 MG tablet Take 1  tablet (5 mg total) by mouth daily. 07/05/23   Setzer, Lynnell Jude, PA-C  butalbital-acetaminophen-caffeine (FIORICET) [phone] MG tablet Take 1 tablet by mouth every 6 (six) hours as needed for headache. 07/05/23   Setzer, Lynnell Jude, PA-C  gabapentin (NEURONTIN) 100 MG capsule Take 1 capsule (100 mg total) by mouth 3 (three) times daily. 07/05/23   Setzer, Lynnell Jude, PA-C  ramelteon (ROZEREM) 8 MG tablet Take 1 tablet (8 mg total) by mouth at bedtime. 07/05/23   Setzer, Lynnell Jude, PA-C  sertraline (ZOLOFT) 25 MG tablet Take 1 tablet (25 mg total) by mouth daily. 07/05/23   Setzer, Lynnell Jude, PA-C  tamsulosin (FLOMAX) 0.4 MG CAPS capsule Take 1 capsule (0.4 mg total) by mouth daily after supper. 07/05/23   Setzer, Lynnell Jude, PA-C  topiramate (TOPAMAX) 100 MG tablet Take 1 tablet (100 mg total) by mouth at bedtime. 07/05/23   Setzer, Lynnell Jude, PA-C  traZODone (DESYREL) 100 MG tablet Take 1 tablet (100 mg total) by mouth at bedtime. 07/05/23   Setzer, Lynnell Jude, PA-C  Vitamin D, Ergocalciferol, (DRISDOL) 1.25 MG (50000 UNIT) CAPS capsule Take 1 capsule (50,000 Units total) by mouth every 7 (seven) days. Take each Friday until finished. 07/05/23   Setzer, Lynnell Jude, PA-C      Allergies    Meat extract    Review of Systems   Review of Systems  Physical Exam Updated Vital Signs BP 119/87   Pulse 82   Temp (!) 96.9 F (36.1 C) (Axillary)   Resp (!) 22   Ht 4\' 8"  (1.422 m)  Wt 54 kg   SpO2 99%   BMI 26.69 kg/m  Physical Exam Constitutional:      Comments: On arrival, patient has very confused appearance.  She has gaze predominantly toward the left.  No respiratory distress.  HENT:     Mouth/Throat:     Pharynx: Oropharynx is clear.  Eyes:     Comments: Left gaze deviation  Cardiovascular:     Rate and Rhythm: Normal rate and regular rhythm.  Pulmonary:     Effort: Pulmonary effort is normal.     Breath sounds: Normal breath sounds.  Abdominal:     General: There is no distension.     Palpations:  Abdomen is soft.  Musculoskeletal:     Right lower leg: No edema.     Left lower leg: No edema.  Skin:    General: Skin is warm and dry.  Neurological:     Comments: Patient is making very limited verbal response.  With strong prompting and translator she is making some very vague verbal response.  She cannot really seem to follow any commands for strength testing with a flaccid paralysis on the right.  Has made a few spontaneous movements of the left lower extremity and upper extremity.     ED Results / Procedures / Treatments   Labs (all labs ordered are listed, but only abnormal results are displayed) Labs Reviewed  CBC - Abnormal; Notable for the following components:      Result Value   RDW 15.6 (*)    All other components within normal limits  I-STAT CHEM 8, ED - Abnormal; Notable for the following components:   Potassium 6.0 (*)    Glucose, Bld 100 (*)    All other components within normal limits  PROTIME-INR  APTT  DIFFERENTIAL  ETHANOL  COMPREHENSIVE METABOLIC PANEL  URINALYSIS, ROUTINE W REFLEX MICROSCOPIC  CBG MONITORING, ED    EKG EKG Interpretation Date/Time:  Saturday July 06 2023 14:49:02 EDT Ventricular Rate:  71 PR Interval:  199 QRS Duration:  92 QT Interval:  439 QTC Calculation: 488 R Axis:   59  Text Interpretation: Sinus or ectopic atrial rhythm extensive artifact. appears similar to previous. A few inferior QRS complexes distorted creating artifact simulating ischemia. Confirmed by Arby Barrette [phone]) on 07/06/2023 4:14:50 PM  Radiology CT ANGIO HEAD NECK W WO CM (CODE STROKE)  Result Date: 07/06/2023 CLINICAL DATA:  Neuro deficit, acute, stroke suspected. Recent subarachnoid hemorrhage. Confusion. Aphasia. EXAM: CT ANGIOGRAPHY HEAD AND NECK WITH AND WITHOUT CONTRAST TECHNIQUE: Multidetector CT imaging of the head and neck was performed using the standard protocol during bolus administration of intravenous contrast. Multiplanar CT image  reconstructions and MIPs were obtained to evaluate the vascular anatomy. Carotid stenosis measurements (when applicable) are obtained utilizing NASCET criteria, using the distal internal carotid diameter as the denominator. RADIATION DOSE REDUCTION: This exam was performed according to the departmental dose-optimization program which includes automated exposure control, adjustment of the mA and/or kV according to patient size and/or use of iterative reconstruction technique. CONTRAST:  75mL OMNIPAQUE IOHEXOL 350 MG/ML SOLN COMPARISON:  CT head without contrast 07/06/2023. CT angio head and neck 06/10/2023. FINDINGS: CTA NECK FINDINGS Aortic arch: Atherosclerotic calcifications are again noted at the aortic arch and great vessel origins without focal stenosis or aneurysm. Right carotid system: The cervical scratched at the right common carotid artery is within normal limits. Minimal atherosclerotic irregularity is present at the bifurcation without significant stenosis. Mild tortuosity is present in mid cervical  right ICA without significant stenosis. Left carotid system: Mild tortuosity is present in the left common carotid artery. Atherosclerotic changes are present bifurcation without significant stenosis. Mild tortuosity is again noted in the cervical left ICA without significant stenosis. Vertebral arteries: The vertebral arteries are codominant. Both vertebral arteries originate from the subclavian arteries. Moderate right-sided stenosis is again noted. No other significant stenosis is present in either vertebral artery in the neck. Skeleton: Vertebral body heights and alignment are normal. No focal osseous lesions are present. The patient is edentulous. Other neck: Overall the study is mildly degraded by patient motion. No significant interval change is present. Heterogeneous thyroid is noted. A dominant 18 mm nodule is again noted within the left lobe of the thyroid. Upper chest: Patchy airspace opacities are  present posteriorly in the lungs bilaterally, right greater than left. No nodular airspace disease present. The heart is enlarged. Review of the MIP images confirms the above findings CTA HEAD FINDINGS Anterior circulation: The internal carotid arteries are within normal limits through the ICA termini. The A1 and M1 segments are normal. A large coil mass is now present at the left MCA bifurcation. A 9 mm right MCA bifurcation aneurysm with a wide neck is again noted. M2 segments originate from the base of the aneurysm. ACA and MCA branch vessels are within normal limits bilaterally. Posterior circulation: The vertebral arteries are codominant. Vertebrobasilar junction basilar artery is normal. The right posterior cerebral artery originates from basilar tip. The left posterior cerebral artery is of fetal type. 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 The PCA branch vessels are normal bilaterally. Venous sinuses: The dural sinuses are patent. The straight sinus and deep cerebral veins are intact. Cortical veins are within normal limits. No significant vascular malformation is evident. Anatomic variants: Fetal type left posterior cerebral artery. Review of the MIP images confirms the above findings IMPRESSION: 1. Large coil mass now present at the left MCA bifurcation. 2. Stable 9 mm right MCA bifurcation aneurysm with a wide neck. M2 segments originate from the base of the aneurysm. 3. 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 4. 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 5. Stable mild atherosclerotic changes at the carotid bifurcations without significant stenosis. 6. Patchy airspace opacities posteriorly in the lungs bilaterally, right greater than left. This may represent atelectasis or infection. 7.  Aortic Atherosclerosis (ICD10-I70.0). The above was relayed via text pager to Dr. Milon Dikes on 07/06/2023 at 15:00 . Electronically Signed   By:  Marin Roberts M.D.   On: 07/06/2023 15:00   CT HEAD CODE STROKE WO CONTRAST  Result Date: 07/06/2023 CLINICAL DATA:  Code stroke. Neuro deficit, acute, stroke suspected. Recent subarachnoid hemorrhage treated with endovascular obliteration. EXAM: CT HEAD WITHOUT CONTRAST TECHNIQUE: Contiguous axial images were obtained from the base of the skull through the vertex without intravenous contrast. RADIATION DOSE REDUCTION: This exam was performed according to the departmental dose-optimization program which includes automated exposure control, adjustment of the mA and/or kV according to patient size and/or use of iterative reconstruction technique. COMPARISON:  CT head without contrast 07/03/2023 FINDINGS: Brain: Accounting for streak artifact in the left MCA territory, no acute infarct, hemorrhage or mass lesion is present. Mild atrophy and white matter changes are likely within normal limits for age. Deep brain nuclei are within normal limits. The ventricles are of normal size. No significant extraaxial fluid collection is present. Vascular: Coil mass is present in the left MCA bifurcation. No  significant vascular calcifications are present. No hyperdense vessel is present. Skull: No significant extracranial soft tissue lesion is present. Calvarium is intact. No focal lytic or blastic lesions are present. Sinuses/Orbits: The paranasal sinuses and mastoid air cells are clear. The globes and orbits are within normal limits. ASPECTS [facility] Stroke Program Early CT Score) - Ganglionic level infarction (caudate, lentiform nuclei, internal capsule, insula, M1-M3 cortex): 7/7 - Supraganglionic infarction (M4-M6 cortex): 3/3 Total score (0-10 with 10 being normal): 10/10 IMPRESSION: 1. No acute intracranial abnormality. 2. Coil mass in the left MCA bifurcation. 3. Mild atrophy and white matter changes are likely within normal limits for age. 4. Aspects is 10/10. The above was relayed via text pager to Dr. Wilford Corner on  07/06/2023 at 14:33 . Electronically Signed   By: Marin Roberts M.D.   On: 07/06/2023 14:34    Procedures Procedures    Medications Ordered in ED Medications  sodium chloride flush (NS) 0.9 % injection 3 mL (3 mLs Intravenous Given 07/06/23 1449)  LORazepam (ATIVAN) injection 1 mg (1 mg Intravenous Given 07/06/23 1448)  levETIRAcetam (KEPPRA) IVPB 1000 mg/100 mL premix (0 mg Intravenous Stopped 07/06/23 1517)  iohexol (OMNIPAQUE) 350 MG/ML injection 75 mL (75 mLs Intravenous Contrast Given 07/06/23 1444)  LORazepam (ATIVAN) injection 2 mg (2 mg Intravenous Given 07/06/23 1606)    ED Course/ Medical Decision Making/ A&P                                 Medical Decision Making Amount and/or Complexity of Data Reviewed Labs: ordered. Radiology: ordered.  Risk Decision regarding hospitalization.   Patient assessed upon EMS arrival as code stroke.  Airway cleared.  Stroke team with Dr. Jerrell Belfast escorted patient to CT scan.  Arrival patient appears to have a right hemiparalysis.  Patient has had a recent complex subarachnoid hemorrhage with left MCA coiling.  This was discussed with Dr. Jerrell Belfast and differential diagnosis includes acute stroke\seizure secondary to prior subarachnoid hemorrhage\recurrent bleeding.  Dr. Jerrell Belfast proceeded with loading dose for Keppra and milligram of Ativan for possible seizure with plan to proceed with EEG and MRI.  Upon return from CT, no new acute bleeding identified.  Patient is more agitated now.  She is trying to roll to the right side in the stretcher.  Some picking behavior consistent with delirium.  CBC normal i-STAT Chem-8 shows sodium 138 potassium 6.0 BUN 16 and creatinine 0.7.  I suspect hemolysis with the elevated potassium will be awaiting complete metabolic panel.  Have updated the patient's son and family members at bedside.  Dr. Wilford Corner  recommends admission to medical service he will continue to consult review MRI and EEG for further  management commendations.          Final Clinical Impression(s) / ED Diagnoses Final diagnoses:  Delirium  Acute right-sided weakness  History of subarachnoid hemorrhage    Rx / DC Orders ED Discharge Orders     None         Arby Barrette, MD 07/06/23 1623

## 2023-07-06 NOTE — Progress Notes (Signed)
EEG complete - results pending 

## 2023-07-06 NOTE — ED Provider Notes (Signed)
Signed out that hospitalist call back pending, plan for admission with neurology consult. Dr Wilford Corner is leaving consult recommendations.   Discussed with hospitalists, will admit.   Repeat chem remains pending, as does ua, cxr  and mri.     Cathren Laine, MD 07/06/23 [phone]

## 2023-07-06 NOTE — ED Notes (Signed)
ED TO INPATIENT HANDOFF REPORT  ED Nurse Name and Phone #: Seward Grater [phone]  S Name/Age/Gender Yolanda [name] 89 y.o. female Room/Bed: 016C/016C  Code Status   Code Status: Full Code  Home/SNF/Other Home Patient oriented to: self Is this baseline? No   Triage Complete: Triage complete  Chief Complaint Acute encephalopathy [G93.40]  Triage Note Pt arrives via EMS from home with reports of LSN 1030. Family took pt to bathroom and at 1040, pt was unable to get up with right sided deficits and facial droop.     Allergies Allergies  Allergen Reactions   Meat Extract Other (See Comments)    Pt vegetarian     Level of Care/Admitting Diagnosis ED Disposition     ED Disposition  Admit   Condition  --   Comment  Hospital Area: [facility] [[id number]]  Level of Care: Telemetry Medical [104]  May place patient in observation at [facility] or [facility] if equivalent level of care is available:: No  Covid Evaluation: Asymptomatic - no recent exposure (last 10 days) testing not required  Diagnosis: Acute encephalopathy [562130]  Admitting Physician: Clydie Braun [[id number]]  Attending Physician: Clydie Braun [[id number]]          B Medical/Surgery History Past Medical History:  Diagnosis Date   Closed fracture of left proximal humerus 03/27/2021   Closed fracture of lower end of left radius with routine healing 10/31/2020   Dementia (HCC)    Mild intermittent asthma without complication 04/04/2019   OSTEOARTHRITIS 08/04/2010   Annotation: Bilateral knees and ankles Qualifier: Diagnosis of  By: Delrae Alfred MD, Lanora Manis     Past Surgical History:  Procedure Laterality Date   IR 3D INDEPENDENT WKST  06/10/2023   IR ANGIO INTRA EXTRACRAN SEL INTERNAL CAROTID UNI L MOD SED  06/11/2023   IR ANGIOGRAM FOLLOW UP STUDY  06/10/2023   IR ANGIOGRAM FOLLOW UP STUDY  06/10/2023   IR NEURO EACH ADD'L AFTER BASIC UNI LEFT (MS)  06/10/2023   IR TRANSCATH/EMBOLIZ   06/10/2023   RADIOLOGY WITH ANESTHESIA N/A 06/10/2023   Procedure: IR WITH ANESTHESIA;  Surgeon: Lisbeth Renshaw, MD;  Location: [facility];  Service: Radiology;  Laterality: N/A;     A IV Location/Drains/Wounds Patient Lines/Drains/Airways Status     Active Line/Drains/Airways     Name Placement date Placement time Site Days   Peripheral IV 07/06/23 20 G Anterior;Left Forearm 07/06/23  1448  Forearm  less than 1            Intake/Output Last 24 hours No intake or output data in the 24 hours ending 07/06/23 1752  Labs/Imaging Results for orders placed or performed during the hospital encounter of 07/06/23 (from the past 48 hour(s))  CBG monitoring, ED     Status: None   Collection Time: 07/06/23  2:18 PM  Result Value Ref Range   Glucose-Capillary 98 70 - 99 mg/dL    Comment: Glucose reference range applies only to samples taken after fasting for at least 8 hours.  Protime-INR     Status: None   Collection Time: 07/06/23  2:22 PM  Result Value Ref Range   Prothrombin Time 12.3 11.4 - 15.2 seconds   INR 0.9 0.8 - 1.2    Comment: (NOTE) INR goal varies based on device and disease states. Performed at [facility], [address], [place], [place] [address]   APTT     Status: None   Collection Time: 07/06/23  2:22  PM  Result Value Ref Range   aPTT 27 24 - 36 seconds    Comment: Performed at [facility], [address], [place], [place] [address]  CBC     Status: Abnormal   Collection Time: 07/06/23  2:22 PM  Result Value Ref Range   WBC 6.5 4.0 - 10.5 K/uL   RBC 4.38 3.87 - 5.11 MIL/uL   Hemoglobin 12.4 12.0 - 15.0 g/dL   HCT 95.6 38.7 - 56.4 %   MCV 91.6 80.0 - 100.0 fL   MCH 28.3 26.0 - 34.0 pg   MCHC 30.9 30.0 - 36.0 g/dL   RDW 33.2 (H) 95.1 - 88.4 %   Platelets 202 150 - 400 K/uL   nRBC 0.0 0.0 - 0.2 %    Comment: Performed at [facility], [address], [place], [place] [address]  Differential     Status: None   Collection Time: 07/06/23   2:22 PM  Result Value Ref Range   Neutrophils Relative % 52 %   Neutro Abs 3.4 1.7 - 7.7 K/uL   Lymphocytes Relative 32 %   Lymphs Abs 2.1 0.7 - 4.0 K/uL   Monocytes Relative 8 %   Monocytes Absolute 0.5 0.1 - 1.0 K/uL   Eosinophils Relative 6 %   Eosinophils Absolute 0.4 0.0 - 0.5 K/uL   Basophils Relative 1 %   Basophils Absolute 0.1 0.0 - 0.1 K/uL   Immature Granulocytes 1 %   Abs Immature Granulocytes 0.06 0.00 - 0.07 K/uL    Comment: Performed at [facility], [address], [place], [place] [address]  Ethanol     Status: None   Collection Time: 07/06/23  2:22 PM  Result Value Ref Range   Alcohol, Ethyl (B) <10 <10 mg/dL    Comment: (NOTE) Lowest detectable limit for serum alcohol is 10 mg/dL.  For medical purposes only. Performed at [facility], [address], [place], [place] [address]   I-stat chem 8, ED     Status: Abnormal   Collection Time: 07/06/23  2:26 PM  Result Value Ref Range   Sodium 138 135 - 145 mmol/L   Potassium 6.0 (H) 3.5 - 5.1 mmol/L   Chloride 109 98 - 111 mmol/L   BUN 16 8 - 23 mg/dL   Creatinine, Ser 3.55 0.44 - 1.00 mg/dL   Glucose, Bld 732 (H) 70 - 99 mg/dL    Comment: Glucose reference range applies only to samples taken after fasting for at least 8 hours.   Calcium, Ion 1.18 1.15 - 1.40 mmol/L   TCO2 22 22 - 32 mmol/L   Hemoglobin 13.6 12.0 - 15.0 g/dL   HCT 20.2 54.2 - 70.6 %   MR BRAIN WO CONTRAST  Result Date: 07/06/2023 CLINICAL DATA:  Headache.  Neuro deficit. EXAM: MRI HEAD WITHOUT CONTRAST TECHNIQUE: Multiplanar, multiecho pulse sequences of the brain and surrounding structures were obtained without intravenous contrast. COMPARISON:  CT head without contrast 07/03/2023 and 07/06/2023. FINDINGS: Brain: The axial diffusion-weighted images are distorted by nasal jewelry. Coronal images demonstrate acute/subacute infarcts of the left frontal operculum and basal ganglia. T1 weighted images demonstrate some shortening in the  left frontal operculum consistent with cortical laminar necrosis. This is consistent with both acute and subacute infarcts. Remote subarachnoid blood products are present. No new hemorrhage is present. The left MCA coil mass is noted. No significant mass effect or midline shift is present. The ventricles are of normal  size. The brainstem and cerebellum are within normal limits. The internal auditory canals are within normal limits. Enlarged empty sella is present. Midline structures are otherwise unremarkable. Vascular: Flow is present in the major intracranial arteries. 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 Skull and upper cervical spine: The craniocervical junction is normal. Upper cervical spine is within normal limits. Marrow signal is unremarkable. Sinuses/Orbits: The paranasal sinuses and mastoid air cells are clear. The globes and orbits are within normal limits. IMPRESSION: 1. 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 2. Remote subarachnoid blood products are present. No new hemorrhage is present. 3. 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 4. Enlarged empty sella. This is nonspecific, but can be seen in the setting of idiopathic intracranial hypertension. 5. The axial diffusion-weighted images are distorted by nasal jewelry. 6. Coronal images demonstrate acute/subacute infarcts of the left frontal operculum and basal ganglia. Electronically Signed   By: Marin Roberts M.D.   On: 07/06/2023 17:28   CT ANGIO HEAD NECK W WO CM (CODE STROKE)  Result Date: 07/06/2023 CLINICAL DATA:  Neuro deficit, acute, stroke suspected. Recent subarachnoid hemorrhage. Confusion. Aphasia. EXAM: CT ANGIOGRAPHY HEAD AND NECK WITH AND WITHOUT CONTRAST TECHNIQUE: Multidetector CT imaging of the head and neck was performed using the standard protocol during bolus administration of intravenous contrast. Multiplanar CT image reconstructions and MIPs were obtained to evaluate the vascular  anatomy. Carotid stenosis measurements (when applicable) are obtained utilizing NASCET criteria, using the distal internal carotid diameter as the denominator. RADIATION DOSE REDUCTION: This exam was performed according to the departmental dose-optimization program which includes automated exposure control, adjustment of the mA and/or kV according to patient size and/or use of iterative reconstruction technique. CONTRAST:  75mL OMNIPAQUE IOHEXOL 350 MG/ML SOLN COMPARISON:  CT head without contrast 07/06/2023. CT angio head and neck 06/10/2023. FINDINGS: CTA NECK FINDINGS Aortic arch: Atherosclerotic calcifications are again noted at the aortic arch and great vessel origins without focal stenosis or aneurysm. Right carotid system: The cervical scratched at the right common carotid artery is within normal limits. Minimal atherosclerotic irregularity is present at the bifurcation without significant stenosis. Mild tortuosity is present in mid cervical right ICA without significant stenosis. Left carotid system: Mild tortuosity is present in the left common carotid artery. Atherosclerotic changes are present bifurcation without significant stenosis. Mild tortuosity is again noted in the cervical left ICA without significant stenosis. Vertebral arteries: The vertebral arteries are codominant. Both vertebral arteries originate from the subclavian arteries. Moderate right-sided stenosis is again noted. No other significant stenosis is present in either vertebral artery in the neck. Skeleton: Vertebral body heights and alignment are normal. No focal osseous lesions are present. The patient is edentulous. Other neck: Overall the study is mildly degraded by patient motion. No significant interval change is present. Heterogeneous thyroid is noted. A dominant 18 mm nodule is again noted within the left lobe of the thyroid. Upper chest: Patchy airspace opacities are present posteriorly in the lungs bilaterally, right greater  than left. No nodular airspace disease present. The heart is enlarged. Review of the MIP images confirms the above findings CTA HEAD FINDINGS Anterior circulation: The internal carotid arteries are within normal limits through the ICA termini. The A1 and M1 segments are normal. A large coil mass is now present at the left MCA bifurcation. A 9 mm right MCA bifurcation aneurysm with a wide neck is again noted. M2 segments originate from the base of the aneurysm. ACA and MCA branch vessels  are within normal limits bilaterally. Posterior circulation: The vertebral arteries are codominant. Vertebrobasilar junction basilar artery is normal. The right posterior cerebral artery originates from basilar tip. The left posterior cerebral artery is of fetal type. 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 The PCA branch vessels are normal bilaterally. Venous sinuses: The dural sinuses are patent. The straight sinus and deep cerebral veins are intact. Cortical veins are within normal limits. No significant vascular malformation is evident. Anatomic variants: Fetal type left posterior cerebral artery. Review of the MIP images confirms the above findings IMPRESSION: 1. Large coil mass now present at the left MCA bifurcation. 2. Stable 9 mm right MCA bifurcation aneurysm with a wide neck. M2 segments originate from the base of the aneurysm. 3. 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 4. 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 5. Stable mild atherosclerotic changes at the carotid bifurcations without significant stenosis. 6. Patchy airspace opacities posteriorly in the lungs bilaterally, right greater than left. This may represent atelectasis or infection. 7.  Aortic Atherosclerosis (ICD10-I70.0). The above was relayed via text pager to Dr. Milon Dikes on 07/06/2023 at 15:00 . Electronically Signed   By: Marin Roberts M.D.   On: 07/06/2023 15:00   CT HEAD  CODE STROKE WO CONTRAST  Result Date: 07/06/2023 CLINICAL DATA:  Code stroke. Neuro deficit, acute, stroke suspected. Recent subarachnoid hemorrhage treated with endovascular obliteration. EXAM: CT HEAD WITHOUT CONTRAST TECHNIQUE: Contiguous axial images were obtained from the base of the skull through the vertex without intravenous contrast. RADIATION DOSE REDUCTION: This exam was performed according to the departmental dose-optimization program which includes automated exposure control, adjustment of the mA and/or kV according to patient size and/or use of iterative reconstruction technique. COMPARISON:  CT head without contrast 07/03/2023 FINDINGS: Brain: Accounting for streak artifact in the left MCA territory, no acute infarct, hemorrhage or mass lesion is present. Mild atrophy and white matter changes are likely within normal limits for age. Deep brain nuclei are within normal limits. The ventricles are of normal size. No significant extraaxial fluid collection is present. Vascular: Coil mass is present in the left MCA bifurcation. No significant vascular calcifications are present. No hyperdense vessel is present. Skull: No significant extracranial soft tissue lesion is present. Calvarium is intact. No focal lytic or blastic lesions are present. Sinuses/Orbits: The paranasal sinuses and mastoid air cells are clear. The globes and orbits are within normal limits. ASPECTS [facility] Stroke Program Early CT Score) - Ganglionic level infarction (caudate, lentiform nuclei, internal capsule, insula, M1-M3 cortex): 7/7 - Supraganglionic infarction (M4-M6 cortex): 3/3 Total score (0-10 with 10 being normal): 10/10 IMPRESSION: 1. No acute intracranial abnormality. 2. Coil mass in the left MCA bifurcation. 3. Mild atrophy and white matter changes are likely within normal limits for age. 4. Aspects is 10/10. The above was relayed via text pager to Dr. Wilford Corner on 07/06/2023 at 14:33 . Electronically Signed   By: Marin Roberts M.D.   On: 07/06/2023 14:34    Pending Labs Unresulted Labs (From admission, onward)     Start     Ordered   07/07/23 0500  CBC  Tomorrow morning,   R        07/06/23 1620   07/07/23 0500  Basic metabolic panel  Tomorrow morning,   R        07/06/23 1620   07/06/23 1652  Resp panel by RT-PCR (RSV, Flu A&B, Covid) Anterior Nasal Swab  (Tier  2 - SARS Coronavirus 2 by RT PCR (hospital order, performed in [facility] hospital lab) *cepheid single result test*)  Once,   R       Comments: For Encephalopathy    07/06/23 1651   07/06/23 1612  Urinalysis, Routine w reflex microscopic -Urine, Catheterized  ONCE - STAT,   STAT       Question:  Specimen Source  Answer:  Urine, Catheterized   07/06/23 1611   07/06/23 1507  Comprehensive metabolic panel  Once,   STAT        07/06/23 1507            Vitals/Pain Today's Vitals   07/06/23 1452 07/06/23 1515 07/06/23 1530 07/06/23 1645  BP:  (!) 154/68 119/87 (!) 150/73  Pulse:  75 82 78  Resp:  19 (!) 22 (!) 21  Temp: (!) 96.9 F (36.1 C)     TempSrc: Axillary     SpO2:  98% 99% 100%  Weight:      Height:        Isolation Precautions Airborne and Contact precautions  Medications Medications  enoxaparin (LOVENOX) injection 40 mg (has no administration in time range)  sodium chloride flush (NS) 0.9 % injection 3 mL (has no administration in time range)  acetaminophen (TYLENOL) tablet 650 mg (has no administration in time range)    Or  acetaminophen (TYLENOL) suppository 650 mg (has no administration in time range)  albuterol (PROVENTIL) (2.5 MG/3ML) 0.083% nebulizer solution 2.5 mg (has no administration in time range)  sodium chloride flush (NS) 0.9 % injection 3 mL (3 mLs Intravenous Given 07/06/23 1449)  LORazepam (ATIVAN) injection 1 mg (1 mg Intravenous Given 07/06/23 1448)  levETIRAcetam (KEPPRA) IVPB 1000 mg/100 mL premix (0 mg Intravenous Stopped 07/06/23 1517)  iohexol (OMNIPAQUE) 350 MG/ML injection 75 mL (75 mLs  Intravenous Contrast Given 07/06/23 1444)  LORazepam (ATIVAN) injection 2 mg (2 mg Intravenous Given 07/06/23 1606)    Mobility non-ambulatory until PT/OT     Focused Assessments Neuro Assessment Handoff:  Swallow screen pass? No    NIH Stroke Scale  Dizziness Present: No Headache Present: Yes Interval: Initial Level of Consciousness (1a.)   : Alert, keenly responsive LOC Questions (1b. )   : Answers neither question correctly LOC Commands (1c. )   : Performs one task correctly Best Gaze (2. )  : Normal Visual (3. )  : Partial hemianopia Facial Palsy (4. )    : Minor paralysis Motor Arm, Left (5a. )   : No drift Motor Arm, Right (5b. ) : No effort against gravity Motor Leg, Left (6a. )  : Drift Motor Leg, Right (6b. ) : No effort against gravity Limb Ataxia (7. ): Absent Sensory (8. )  : Mild-to-moderate sensory loss, patient feels pinprick is less sharp or is dull on the affected side, or there is a loss of superficial pain with pinprick, but patient is aware of being touched Best Language (9. )  : Mute, global aphasia Dysarthria (10. ): Severe dysarthria, patient's speech is so slurred as to be unintelligible in the absence of or out of proportion to any dysphasia, or is mute/anarthric Extinction/Inattention (11.)   : Visual/tactile/auditory/spatial/personal inattention Complete NIHSS TOTAL: 19 Last date known well: 07/06/23 Last time known well: 0900 Neuro Assessment: Exceptions to WDL Neuro Checks:   Initial (07/06/23 1445)  Has TPA been given? No If patient is a Neuro Trauma and patient is going to OR before floor call report to 4N  Charge nurse: [phone] or [phone]   R Recommendations: See Admitting Provider Note  Report given to:   Additional Notes:  recent subarachnoid hemorrhage, right sided deficits starting today, speaks [place]

## 2023-07-07 DIAGNOSIS — R410 Disorientation, unspecified: Secondary | ICD-10-CM

## 2023-07-07 DIAGNOSIS — I63512 Cerebral infarction due to unspecified occlusion or stenosis of left middle cerebral artery: Secondary | ICD-10-CM | POA: Diagnosis not present

## 2023-07-07 DIAGNOSIS — I10 Essential (primary) hypertension: Secondary | ICD-10-CM

## 2023-07-07 DIAGNOSIS — R569 Unspecified convulsions: Secondary | ICD-10-CM | POA: Diagnosis not present

## 2023-07-07 DIAGNOSIS — G9340 Encephalopathy, unspecified: Secondary | ICD-10-CM

## 2023-07-07 DIAGNOSIS — Z9889 Other specified postprocedural states: Secondary | ICD-10-CM | POA: Diagnosis not present

## 2023-07-07 DIAGNOSIS — Z8679 Personal history of other diseases of the circulatory system: Secondary | ICD-10-CM | POA: Diagnosis not present

## 2023-07-07 DIAGNOSIS — I639 Cerebral infarction, unspecified: Secondary | ICD-10-CM

## 2023-07-07 LAB — CBC
HCT: 39.6 % (ref 36.0–46.0)
Hemoglobin: 12.4 g/dL (ref 12.0–15.0)
MCH: 28.1 pg (ref 26.0–34.0)
MCHC: 31.3 g/dL (ref 30.0–36.0)
MCV: 89.6 fL (ref 80.0–100.0)
Platelets: 215 10*3/uL (ref 150–400)
RBC: 4.42 MIL/uL (ref 3.87–5.11)
RDW: 15.6 % — ABNORMAL HIGH (ref 11.5–15.5)
WBC: 7.6 10*3/uL (ref 4.0–10.5)
nRBC: 0 % (ref 0.0–0.2)

## 2023-07-07 LAB — BASIC METABOLIC PANEL
Anion gap: 7 (ref 5–15)
BUN: 12 mg/dL (ref 8–23)
CO2: 23 mmol/L (ref 22–32)
Calcium: 9.2 mg/dL (ref 8.9–10.3)
Chloride: 107 mmol/L (ref 98–111)
Creatinine, Ser: 0.82 mg/dL (ref 0.44–1.00)
GFR, Estimated: >60 mL/min (ref >60)
Glucose, Bld: 106 mg/dL — ABNORMAL HIGH (ref 70–99)
Potassium: 3.5 mmol/L (ref 3.5–5.1)
Sodium: 137 mmol/L (ref 135–145)

## 2023-07-07 MED ORDER — ASPIRIN 300 MG RE SUPP
300.0000 mg | Freq: Every day | RECTAL | Status: DC
  Filled 2023-07-07: qty 1

## 2023-07-07 MED ORDER — ACETAMINOPHEN 10 MG/ML IV SOLN
1000.0000 mg | Freq: Once | INTRAVENOUS | Status: AC
  Administered 2023-07-07: 1000 mg via INTRAVENOUS
  Filled 2023-07-07: qty 100

## 2023-07-07 MED ORDER — SODIUM CHLORIDE 0.9 % IV SOLN: INTRAVENOUS | Status: DC

## 2023-07-07 NOTE — Plan of Care (Signed)
Problem: Clinical Measurements: Goal: Diagnostic test results will improve Outcome: Progressing   Problem: Activity: Goal: Risk for activity intolerance will decrease Outcome: Progressing   Problem: Coping: Goal: Level of anxiety will decrease Outcome: Progressing   Problem: Pain Managment: Goal: General experience of comfort will improve Outcome: Progressing

## 2023-07-07 NOTE — Evaluation (Signed)
Clinical/Bedside Swallow Evaluation Patient Details  Name: Yolanda [name] MRN: [id number] Date of Birth: 1933-02-02  Today's Date: 07/07/2023 Time: SLP Start Time (ACUTE ONLY): 0843 SLP Stop Time (ACUTE ONLY): 0900 SLP Time Calculation (min) (ACUTE ONLY): 17 min  Past Medical History:  Past Medical History:  Diagnosis Date   Closed fracture of left proximal humerus 03/27/2021   Closed fracture of lower end of left radius with routine healing 10/31/2020   Dementia (HCC)    Mild intermittent asthma without complication 04/04/2019   OSTEOARTHRITIS 08/04/2010   Annotation: Bilateral knees and ankles Qualifier: Diagnosis of  By: Delrae Alfred MD, Lanora Manis     Past Surgical History:  Past Surgical History:  Procedure Laterality Date   IR 3D INDEPENDENT WKST  06/10/2023   IR ANGIO INTRA EXTRACRAN SEL INTERNAL CAROTID UNI L MOD SED  06/11/2023   IR ANGIOGRAM FOLLOW UP STUDY  06/10/2023   IR ANGIOGRAM FOLLOW UP STUDY  06/10/2023   IR NEURO EACH ADD'L AFTER BASIC UNI LEFT (MS)  06/10/2023   IR TRANSCATH/EMBOLIZ  06/10/2023   RADIOLOGY WITH ANESTHESIA N/A 06/10/2023   Procedure: IR WITH ANESTHESIA;  Surgeon: Lisbeth Renshaw, MD;  Location: [facility] OR;  Service: Radiology;  Laterality: N/A;   HPI:  Yolanda [name] is an 89 yo female presenting to ED 9/14 with R sided weakness and speech difficulty. 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 Recently discharged from [facility] 9/13. Most recently seen by SLP 9/12 tolerating Dys 3 diet with thin liquids. PMH includes dementia, recent SAH secondary to L MCA aneurysmal rupture s/p coil embolization, multiple other aneurysms, developed encephalopathy and agitated delirium    Assessment / Plan / Recommendation  Clinical Impression  Pt appears quite lethargic and seemingly internally distracted, unable to achieve sustained attention to follow commands and complete an oral motor exam. Pt's son present in room to assist with interpreting, who  reports he has noted word finding difficulties and differences with speech. SLP discussed with MD, who ordered SLE to be completed next date. Provided ice chip x1 via spoon with noted oral holding and suspected delayed swallow initiation. Overall, pt's level of distraction and alertness is not appropriate to initiate a PO diet at this time. Recommend she remain NPO. Will continue to follow to assess readiness for PO diet as clinically indicated. SLP Visit Diagnosis: Dysphagia, unspecified (R13.10)    Aspiration Risk  Moderate aspiration risk    Diet Recommendation NPO    Medication Administration: Via alternative means    Other  Recommendations Oral Care Recommendations: Oral care QID;Staff/trained caregiver to provide oral care    Recommendations for follow up therapy are one component of a multi-disciplinary discharge planning process, led by the attending physician.  Recommendations may be updated based on patient status, additional functional criteria and insurance authorization.  Follow up Recommendations Skilled nursing-short term rehab (<3 hours/day)      Assistance Recommended at Discharge    Functional Status Assessment Patient has had a recent decline in their functional status and demonstrates the ability to make significant improvements in function in a reasonable and predictable amount of time.  Frequency and Duration min 2x/week  2 weeks       Prognosis Prognosis for improved oropharyngeal function: Fair Barriers to Reach Goals: Cognitive deficits;Language deficits;Time post onset      Swallow Study   General HPI: Yolanda [name] is an 89 yo female presenting to ED 9/14 with R sided weakness and speech difficulty. MRI Brain with  acute/subacute infarcts in L frontal operculum and basal ganglia. Recently discharged from [facility] 9/13. Most recently seen by SLP 9/12 tolerating Dys 3 diet with thin liquids. PMH includes dementia, recent SAH secondary to L MCA aneurysmal rupture s/p  coil embolization, multiple other aneurysms, developed encephalopathy and agitated delirium Type of Study: Bedside Swallow Evaluation Previous Swallow Assessment: See HPI Diet Prior to this Study: NPO Temperature Spikes Noted: No Respiratory Status: Room air History of Recent Intubation: No Behavior/Cognition: Lethargic/Drowsy;Distractible Oral Cavity Assessment: Within Functional Limits Oral Care Completed by SLP: No Oral Cavity - Dentition: Dentures, bottom;Dentures, top Vision: Functional for self-feeding Self-Feeding Abilities: Total assist Patient Positioning: Upright in bed Baseline Vocal Quality: Not observed Volitional Cough: Cognitively unable to elicit Volitional Swallow: Unable to elicit    Oral/Motor/Sensory Function Overall Oral Motor/Sensory Function: Mild impairment Facial ROM: Reduced right;Suspected CN VII (facial) dysfunction Facial Symmetry: Abnormal symmetry right;Suspected CN VII (facial) dysfunction Facial Strength: Reduced right;Suspected CN VII (facial) dysfunction Facial Sensation: Reduced right;Suspected CN V (Trigeminal) dysfunction Lingual ROM: Within Functional Limits Lingual Symmetry: Within Functional Limits Lingual Strength: Within Functional Limits Lingual Sensation: Within Functional Limits   Ice Chips Ice chips: Impaired Presentation: Spoon Oral Phase Impairments: Poor awareness of bolus Oral Phase Functional Implications: Right anterior spillage;Prolonged oral transit;Oral holding Pharyngeal Phase Impairments: Suspected delayed Swallow   Thin Liquid Thin Liquid: Not tested    Nectar Thick Nectar Thick Liquid: Not tested   Honey Thick Honey Thick Liquid: Not tested   Puree Puree: Not tested   Solid     Solid: Not tested      Gwynneth Aliment, M.A., CF-SLP Speech Language Pathology, Acute Rehabilitation Services  Secure Chat preferred [phone]  07/07/2023,9:10 AM

## 2023-07-07 NOTE — Procedures (Signed)
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 EEG to evaluate for seizure  Level of alertness: Asleep  AEDs during EEG study: TPM  Technical aspects: This EEG study was done with scalp electrodes positioned according to the 10-20 International system of electrode placement. Electrical activity was reviewed with band pass filter of 1-70Hz , sensitivity of 7 uV/mm, display speed of 47mm/sec with a 60Hz  notched filter applied as appropriate. EEG data were recorded continuously and digitally stored.  Video monitoring was available and reviewed as appropriate.  Description: Sleep was characterized by vertex waves, sleep spindles (12 to 14 Hz), maximal frontocentral region. Hyperventilation and photic stimulation were not performed.     IMPRESSION: This study during sleep only is within normal limits. No seizures or epileptiform discharges were seen throughout the recording.   A normal interictal EEG does not exclude nor support the diagnosis of epilepsy.    Yolanda [name]

## 2023-07-07 NOTE — Progress Notes (Addendum)
PROGRESS NOTE        PATIENT DETAILS Name: Yolanda [name] Age: 89 y.o. Sex: female Date of Birth: 1933/04/02 Admit Date: 07/06/2023 Admitting Physician Clydie Braun, MD PCP:Pcp, No  Brief Summary: Patient is a 89 y.o.  female with recent history of SAH-s/p left MCA aneurysm coiling-just discharged home from [facility] on 9/13-presented to the hospital on 9/14 with aphasia/right-sided weakness-she was found to have acute CVA and subsequently admitted to the hospitalist service.  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 9/15>> Spot EEG: No seizures  Significant microbiology data: 9/14>> COVID/influenza/RSV PCR: Negative  Procedures: None  Consults: Neurology  Subjective: Restless-right-sided hemiplegia-aphasic.  Son/son-in-law at bedside.  Objective: Vitals: Blood pressure 110/64, pulse 90, temperature 98 F (36.7 C), resp. rate 20, height 4\' 8"  (1.422 m), weight 54 kg, SpO2 100%.   Exam: Gen Exam:Alert awake-not in any distress HEENT:atraumatic, normocephalic Chest: B/L clear to auscultation anteriorly CVS:S1S2 regular Abdomen:soft non tender, non distended Extremities:no edema Neurology: Aphasic-mild right-sided facial droop-right-sided hemiplegia. Skin: no rash  Pertinent Labs/Radiology:    Latest Ref Rng & Units 07/07/2023    7:06 AM 07/06/2023    2:26 PM 07/06/2023    2:22 PM  CBC  WBC 4.0 - 10.5 K/uL 7.6   6.5   Hemoglobin 12.0 - 15.0 g/dL 81.1  91.4  78.2   Hematocrit 36.0 - 46.0 % 39.6  40.0  40.1   Platelets 150 - 400 K/uL 215   202     Lab Results  Component Value Date    NA 137 07/07/2023   K 3.5 07/07/2023   CL 107 07/07/2023   CO2 23 07/07/2023      Assessment/Plan: Acute CVA With significant right-sided deficits/aphasia-failed swallow screen Unclear whether this is from delayed vasospasm from recent SAH-or other etiology Await PT/OT/SLP eval Await further recommendations from stroke MD/neurosurgery  Recent SAH-with call embolization of left MCA aneurysm CTA as above Neurosurgery evaluation pending  Dysphagia Likely secondary to acute CVA-superimposed on recent SAH Was on a dysphagia 3 diet Currently n.p.o.-awaiting SLP eval  Acute metabolic encephalopathy Restless this morning-likely combination of CVA-possible aspiration pneumonia  Possible aspiration pneumonia/atelectasis Patchy airspace opacities noted on CTA-likely reflecting aspiration-patient asymptomatic-hold off on starting antibiotics at this point. SLP evaluation pending  Chronic HFpEF Euvolemic  HTN Permissive hypertension Bisoprolol on hold  Mild intermittent asthma Bronchodilators  Migraine headaches As needed Tylenol for now Resume Topamax when able  History of urinary retention Flomax was started-during recent hospitalization Resume when oral intake permits.  Palliative care/goals of care Long discussion with son/son-in-law at bedside-they are aware of life disabling situation-and they would prefer to allow clinical outcomes-await SLP/PT/OT eval-and then decide on clinical course of action over the next several days.  Full scope of treatment for now.  BMI: Estimated body mass index is 26.69 kg/m as calculated from the following:   Height as of this encounter: 4\' 8"  (1.422 m).   Weight as of this encounter: 54 kg.   Code status:   Code Status: Full Code   DVT Prophylaxis:  enoxaparin (LOVENOX) injection 40 mg Start: 07/06/23 2200   Family Communication: Son/Son in Social worker at bedside   Disposition Plan: Status is: Inpatient Remains inpatient  appropriate because: Severity of illness   Planned Discharge Destination:Home   Diet: Diet Order             Diet NPO time specified  Diet effective now                     Antimicrobial agents: Anti-infectives (From admission, onward)    None        MEDICATIONS: Scheduled Meds:  enoxaparin (LOVENOX) injection  40 mg Subcutaneous Q24H   ramelteon  8 mg Oral QHS   sertraline  25 mg Oral Daily   sodium chloride flush  3 mL Intravenous Q12H   tamsulosin  0.4 mg Oral QPC supper   topiramate  100 mg Oral QHS   traZODone  100 mg Oral QHS   [START ON 07/12/2023] Vitamin D (Ergocalciferol)  50,000 Units Oral Q7 days   Continuous Infusions: PRN Meds:.acetaminophen **OR** acetaminophen, albuterol   I have personally reviewed following labs and imaging studies  LABORATORY DATA: CBC: Recent Labs  Lab 07/01/23 0713 07/06/23 1422 07/06/23 1426 07/07/23 0706  WBC 7.6 6.5  --  7.6  NEUTROABS  --  3.4  --   --   HGB 11.5* 12.4 13.6 12.4  HCT 36.5 40.1 40.0 39.6  MCV 89.0 91.6  --  89.6  PLT 329 202  --  215    Basic Metabolic Panel: Recent Labs  Lab 07/01/23 0713 07/06/23 1426 07/06/23 1906 07/07/23 0706  NA 135 138 138 137  K 3.9 6.0* 3.9 3.5  CL 105 109 105 107  CO2 23  --  22 23  GLUCOSE 111* 100* 98 106*  BUN 12 16 11 12   CREATININE 0.74 0.70 0.78 0.82  CALCIUM 9.0  --  9.6 9.2    GFR: Estimated Creatinine Clearance: 32.5 mL/min (by C-G formula based on SCr of 0.82 mg/dL).  Liver Function Tests: Recent Labs  Lab 07/06/23 1906  AST 20  ALT 29  ALKPHOS 80  BILITOT 0.3  PROT 7.4  ALBUMIN 3.7   No results for input(s): "LIPASE", "AMYLASE" in the last 168 hours. No results for input(s): "AMMONIA" in the last 168 hours.  Coagulation Profile: Recent Labs  Lab 07/06/23 1422  INR 0.9    Cardiac Enzymes: No results for input(s): "CKTOTAL", "CKMB", "CKMBINDEX", "TROPONINI" in the last 168 hours.  BNP (last 3 results) No results for  input(s): "PROBNP" in the last 8760 hours.  Lipid Profile: No results for input(s): "CHOL", "HDL", "LDLCALC", "TRIG", "CHOLHDL", "LDLDIRECT" in the last 72 hours.  Thyroid Function Tests: No results for input(s): "TSH", "T4TOTAL", "FREET4", "T3FREE", "THYROIDAB" in the last 72 hours.  Anemia Panel: No results for input(s): "VITAMINB12", "FOLATE", "FERRITIN", "TIBC", "IRON", "RETICCTPCT" in the last 72 hours.  Urine analysis:    Component Value Date/Time   COLORURINE STRAW (A) 06/10/2023 0742   APPEARANCEUR CLEAR 06/10/2023 0742   LABSPEC 1.033 (H) 06/10/2023 0742   PHURINE 7.0 06/10/2023 0742   GLUCOSEU 50 (A) 06/10/2023 0742   HGBUR NEGATIVE 06/10/2023 0742   HGBUR negative 08/04/2010 0943   BILIRUBINUR NEGATIVE 06/10/2023 0742   KETONESUR NEGATIVE 06/10/2023 0742   PROTEINUR NEGATIVE 06/10/2023 0742   UROBILINOGEN 0.2 01/01/2012 1259   NITRITE NEGATIVE 06/10/2023 0742   LEUKOCYTESUR NEGATIVE 06/10/2023 0742    Sepsis Labs: Lactic Acid, Venous No results found  for: "LATICACIDVEN"  MICROBIOLOGY: Recent Results (from the past 240 hour(s))  Resp panel by RT-PCR (RSV, Flu A&B, Covid) Anterior Nasal Swab     Status: None   Collection Time: 07/06/23  4:52 PM   Specimen: Anterior Nasal Swab  Result Value Ref Range Status   SARS Coronavirus 2 by RT PCR NEGATIVE NEGATIVE Final   Influenza A by PCR NEGATIVE NEGATIVE Final   Influenza B by PCR NEGATIVE NEGATIVE Final    Comment: (NOTE) The Xpert Xpress SARS-CoV-2/FLU/RSV plus assay is intended as an aid in the diagnosis of influenza from Nasopharyngeal swab specimens and should not be used as a sole basis for treatment. Nasal washings and aspirates are unacceptable for Xpert Xpress SARS-CoV-2/FLU/RSV testing.  Fact Sheet for Patients: BloggerCourse.com  Fact Sheet for Healthcare Providers: SeriousBroker.it  This test is not yet approved or cleared by the [place] FDA  and has been authorized for detection and/or diagnosis of SARS-CoV-2 by FDA under an Emergency Use Authorization (EUA). This EUA will remain in effect (meaning this test can be used) for the duration of the COVID-19 declaration under Section 564(b)(1) of the Act, 21 U.S.C. section 360bbb-3(b)(1), unless the authorization is terminated or revoked.     Resp Syncytial Virus by PCR NEGATIVE NEGATIVE Final    Comment: (NOTE) Fact Sheet for Patients: BloggerCourse.com  Fact Sheet for Healthcare Providers: SeriousBroker.it  This test is not yet approved or cleared by the [place] FDA and has been authorized for detection and/or diagnosis of SARS-CoV-2 by FDA under an Emergency Use Authorization (EUA). This EUA will remain in effect (meaning this test can be used) for the duration of the COVID-19 declaration under Section 564(b)(1) of the Act, 21 U.S.C. section 360bbb-3(b)(1), unless the authorization is terminated or revoked.  Performed at [facility], [address], [place], [place] [address]     RADIOLOGY STUDIES/RESULTS: EEG adult  Result Date: 07/07/2023 Charlsie Quest, MD     07/07/2023  8:08 AM Patient Name: Charle Cotey MRN: [id number] Epilepsy Attending: Charlsie Quest Referring Physician/Provider: Milon Dikes, MD Date: 07/06/2023 Duration: 20.21 mins Patient history: 89 year old woman past history of dementia, recent subarachnoid hemorrhage secondary to a ruptured left MCA aneurysm status post coil embolization and multiple other aneurysms, developed encephalopathy and agitation while in the hospital discharged from rehab on 07/05/2023 brought back after family noticed sudden onset of right sided weakness. EEG to evaluate for seizure Level of alertness: Asleep AEDs during EEG study: TPM Technical aspects: This EEG study was done with scalp electrodes positioned according to the 10-20 International system of electrode  placement. Electrical activity was reviewed with band pass filter of 1-70Hz , sensitivity of 7 uV/mm, display speed of 39mm/sec with a 60Hz  notched filter applied as appropriate. EEG data were recorded continuously and digitally stored.  Video monitoring was available and reviewed as appropriate. Description: Sleep was characterized by vertex waves, sleep spindles (12 to 14 Hz), maximal frontocentral region. Hyperventilation and photic stimulation were not performed.   IMPRESSION: This study during sleep only is within normal limits. No seizures or epileptiform discharges were seen throughout the recording. A normal interictal EEG does not exclude nor support the diagnosis of epilepsy. Charlsie Quest   DG Chest Port 1 View  Result Date: 07/06/2023 CLINICAL DATA:  Altered level of consciousness, facial droop EXAM: PORTABLE CHEST 1 VIEW COMPARISON:  06/18/2023 FINDINGS: Single frontal view of the chest demonstrates an enlarged cardiac silhouette. Lung volumes are diminished, with crowding the central  vasculature. Chronic interstitial scarring, without acute airspace disease, effusion, or pneumothorax. No acute bony abnormalities. IMPRESSION: 1. Low lung volumes.  No acute process. Electronically Signed   By: Sharlet Salina M.D.   On: 07/06/2023 18:52   MR BRAIN WO CONTRAST  Result Date: 07/06/2023 CLINICAL DATA:  Headache.  Neuro deficit. EXAM: MRI HEAD WITHOUT CONTRAST TECHNIQUE: Multiplanar, multiecho pulse sequences of the brain and surrounding structures were obtained without intravenous contrast. COMPARISON:  CT head without contrast 07/03/2023 and 07/06/2023. FINDINGS: Brain: The axial diffusion-weighted images are distorted by nasal jewelry. Coronal images demonstrate acute/subacute infarcts of the left frontal operculum and basal ganglia. T1 weighted images demonstrate some shortening in the left frontal operculum consistent with cortical laminar necrosis. This is consistent with both acute and  subacute infarcts. Remote subarachnoid blood products are present. No new hemorrhage is present. The left MCA coil mass is noted. No significant mass effect or midline shift is present. The ventricles are of normal size. The brainstem and cerebellum are within normal limits. The internal auditory canals are within normal limits. Enlarged empty sella is present. Midline structures are otherwise unremarkable. Vascular: Flow is present in the major intracranial arteries. 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 Skull and upper cervical spine: The craniocervical junction is normal. Upper cervical spine is within normal limits. Marrow signal is unremarkable. Sinuses/Orbits: The paranasal sinuses and mastoid air cells are clear. The globes and orbits are within normal limits. IMPRESSION: 1. 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 2. Remote subarachnoid blood products are present. No new hemorrhage is present. 3. 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 4. Enlarged empty sella. This is nonspecific, but can be seen in the setting of idiopathic intracranial hypertension. 5. The axial diffusion-weighted images are distorted by nasal jewelry. 6. Coronal images demonstrate acute/subacute infarcts of the left frontal operculum and basal ganglia. Electronically Signed   By: Marin Roberts M.D.   On: 07/06/2023 17:28   CT ANGIO HEAD NECK W WO CM (CODE STROKE)  Result Date: 07/06/2023 CLINICAL DATA:  Neuro deficit, acute, stroke suspected. Recent subarachnoid hemorrhage. Confusion. Aphasia. EXAM: CT ANGIOGRAPHY HEAD AND NECK WITH AND WITHOUT CONTRAST TECHNIQUE: Multidetector CT imaging of the head and neck was performed using the standard protocol during bolus administration of intravenous contrast. Multiplanar CT image reconstructions and MIPs were obtained to evaluate the vascular anatomy. Carotid stenosis measurements (when applicable) are obtained utilizing NASCET criteria, using the  distal internal carotid diameter as the denominator. RADIATION DOSE REDUCTION: This exam was performed according to the departmental dose-optimization program which includes automated exposure control, adjustment of the mA and/or kV according to patient size and/or use of iterative reconstruction technique. CONTRAST:  75mL OMNIPAQUE IOHEXOL 350 MG/ML SOLN COMPARISON:  CT head without contrast 07/06/2023. CT angio head and neck 06/10/2023. FINDINGS: CTA NECK FINDINGS Aortic arch: Atherosclerotic calcifications are again noted at the aortic arch and great vessel origins without focal stenosis or aneurysm. Right carotid system: The cervical scratched at the right common carotid artery is within normal limits. Minimal atherosclerotic irregularity is present at the bifurcation without significant stenosis. Mild tortuosity is present in mid cervical right ICA without significant stenosis. Left carotid system: Mild tortuosity is present in the left common carotid artery. Atherosclerotic changes are present bifurcation without significant stenosis. Mild tortuosity is again noted in the cervical left ICA without significant stenosis. Vertebral arteries: The vertebral arteries are codominant. Both vertebral arteries originate from the subclavian arteries. Moderate right-sided stenosis is again  noted. No other significant stenosis is present in either vertebral artery in the neck. Skeleton: Vertebral body heights and alignment are normal. No focal osseous lesions are present. The patient is edentulous. Other neck: Overall the study is mildly degraded by patient motion. No significant interval change is present. Heterogeneous thyroid is noted. A dominant 18 mm nodule is again noted within the left lobe of the thyroid. Upper chest: Patchy airspace opacities are present posteriorly in the lungs bilaterally, right greater than left. No nodular airspace disease present. The heart is enlarged. Review of the MIP images confirms the  above findings CTA HEAD FINDINGS Anterior circulation: The internal carotid arteries are within normal limits through the ICA termini. The A1 and M1 segments are normal. A large coil mass is now present at the left MCA bifurcation. A 9 mm right MCA bifurcation aneurysm with a wide neck is again noted. M2 segments originate from the base of the aneurysm. ACA and MCA branch vessels are within normal limits bilaterally. Posterior circulation: The vertebral arteries are codominant. Vertebrobasilar junction basilar artery is normal. The right posterior cerebral artery originates from basilar tip. The left posterior cerebral artery is of fetal type. 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 The PCA branch vessels are normal bilaterally. Venous sinuses: The dural sinuses are patent. The straight sinus and deep cerebral veins are intact. Cortical veins are within normal limits. No significant vascular malformation is evident. Anatomic variants: Fetal type left posterior cerebral artery. Review of the MIP images confirms the above findings IMPRESSION: 1. Large coil mass now present at the left MCA bifurcation. 2. Stable 9 mm right MCA bifurcation aneurysm with a wide neck. M2 segments originate from the base of the aneurysm. 3. 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 4. 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 5. Stable mild atherosclerotic changes at the carotid bifurcations without significant stenosis. 6. Patchy airspace opacities posteriorly in the lungs bilaterally, right greater than left. This may represent atelectasis or infection. 7.  Aortic Atherosclerosis (ICD10-I70.0). The above was relayed via text pager to Dr. Milon Dikes on 07/06/2023 at 15:00 . Electronically Signed   By: Marin Roberts M.D.   On: 07/06/2023 15:00   CT HEAD CODE STROKE WO CONTRAST  Result Date: 07/06/2023 CLINICAL DATA:  Code stroke. Neuro deficit, acute, stroke  suspected. Recent subarachnoid hemorrhage treated with endovascular obliteration. EXAM: CT HEAD WITHOUT CONTRAST TECHNIQUE: Contiguous axial images were obtained from the base of the skull through the vertex without intravenous contrast. RADIATION DOSE REDUCTION: This exam was performed according to the departmental dose-optimization program which includes automated exposure control, adjustment of the mA and/or kV according to patient size and/or use of iterative reconstruction technique. COMPARISON:  CT head without contrast 07/03/2023 FINDINGS: Brain: Accounting for streak artifact in the left MCA territory, no acute infarct, hemorrhage or mass lesion is present. Mild atrophy and white matter changes are likely within normal limits for age. Deep brain nuclei are within normal limits. The ventricles are of normal size. No significant extraaxial fluid collection is present. Vascular: Coil mass is present in the left MCA bifurcation. No significant vascular calcifications are present. No hyperdense vessel is present. Skull: No significant extracranial soft tissue lesion is present. Calvarium is intact. No focal lytic or blastic lesions are present. Sinuses/Orbits: The paranasal sinuses and mastoid air cells are clear. The globes and orbits are within normal limits. ASPECTS [facility] Stroke Program Early CT Score) - Ganglionic level infarction (  caudate, lentiform nuclei, internal capsule, insula, M1-M3 cortex): 7/7 - Supraganglionic infarction (M4-M6 cortex): 3/3 Total score (0-10 with 10 being normal): 10/10 IMPRESSION: 1. No acute intracranial abnormality. 2. Coil mass in the left MCA bifurcation. 3. Mild atrophy and white matter changes are likely within normal limits for age. 4. Aspects is 10/10. The above was relayed via text pager to Dr. Wilford Corner on 07/06/2023 at 14:33 . Electronically Signed   By: Marin Roberts M.D.   On: 07/06/2023 14:34     LOS: 1 day   Jeoffrey Massed, MD  [facility]    To  contact the attending provider between 7A-7P or the covering provider during after hours 7P-7A, please log into the web site [email or web address] and access using universal [place] password for that web site. If you do not have the password, please call the hospital operator.  07/07/2023, 8:28 AM

## 2023-07-07 NOTE — Consult Note (Addendum)
   Providing Compassionate, Quality Care - Together  Neurosurgery Consult  Referring physician: Dr. Wilford Corner Reason for referral: CVA/recent aneurysm treatment  Chief Complaint: Acute right-sided weakness and altered mental status  History of Present Illness: This is an 89 year old female, Nepalese speaking, with a history of left large MCA aneurysm coiling 06/10/2023 by Dr. Conchita Paris.  She presents with acute onset right-sided weakness and altered mental status concerning for seizure versus stroke.  She was noted to have progressively worsening altered mental status and weakness throughout the morning yesterday.  CT angiogram revealed no acute pathology, stable aneurysms and stable coil mass in the left MCA.  MRI revealed a left frontal opercular and basal ganglia acute CVA.  History reviewed. No pertinent past medical history. History reviewed. No pertinent surgical history.  Medications: I have reviewed the patient's current medications. Allergies: No Known Allergies  History reviewed. No pertinent family history. Social History:  has no history on file for tobacco use, alcohol use, and drug use.  ROS: All pertinent positives and negatives are listed in HPI above  Physical Exam:  Vital signs in last 24 hours: Temp:  [98 F (36.7 C)-98.3 F (36.8 C)] 98 F (36.7 C) (07/25 1814) Pulse Rate:  [58-128] 65 (07/26 0746) Resp:  [11-18] 14 (07/26 0217) BP: (138-182)/(65-125) 153/88 (07/26 0700) SpO2:  [91 %-98 %] 96 % (07/26 0746) PE: Awake, sleepy, PERRLA Right facial droop Right arm 0/5, leg 1/5 Left upper/lower extremity full strength, following commands on the left appropriately Noncommunicative Exam performed with son at bedside as a translator   Impression/Assessment:  89 year old female with  Acute left frontal/basal ganglia CVA Recent left MCA aneurysm coiling  Plan:  -CT angiogram reviewed, no evidence of significant spasm, and given the timing, it is certainly  possible but quite unlikely spasm being the etiology of this stroke and distribution. -Recommend treating likely has an ischemic stroke -EEG negative -discuss with Dr. Conchita Paris, ok for anticoag/antiplatelet as needed for ischemic CVA -PT/OT/speech therapy  Thank you for allowing me to participate in this patient's care.  Please do not hesitate to call with questions or concerns.   Monia Pouch, DO Neurosurgeon [facility] [phone]

## 2023-07-07 NOTE — Progress Notes (Addendum)
STROKE TEAM PROGRESS NOTE   BRIEF HPI [name] is a 89 y.o. 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  On the morning after her discharge from rehab on 9/13, patient complained of headache and was found to have aphasia, right sided weakness, and drooling.  CT head showed coil mass in left MCA bifurcation, confirmed with CTA head/neck.  MRI brain: Acute/subacute infarct involving left frontal operculum and basal ganglia.  EEG negative.  Likeliest etiology: Acute left MCA infarct versus delayed vasospasm from recent subarachnoid hemorrhage.   SIGNIFICANT HOSPITAL EVENTS 9/14: Admission, transfer to floor, EEG negative palliative care conversation with family   INTERIM HISTORY/SUBJECTIVE  On interview with family in room, patient is lethargic and minimally attentive.  Endorses headache.  Follows midline commands open (closes eyes, sticks tongue out), does not follow left-sided commands.  Gross right-sided weakness: Flaccid right upper limb and lower right lower limb with slight right-sided withdrawal to pain.  Right facial droop.  Discussed with family - some discussion of discharging patient to home hospice today, however family decided to keep her in the hospital for 1 or 2 days for observation on IV fluids.  Made clear that they do not want invasive measures such as NG tube/PEG.  It is important to patient, per family, that they are able to conduct end-of-life religious practices.   OBJECTIVE  CBC    Component Value Date/Time   WBC 7.6 07/07/2023 0706   RBC 4.42 07/07/2023 0706   HGB 12.4 07/07/2023 0706   HCT 39.6 07/07/2023 0706   PLT 215 07/07/2023 0706   MCV 89.6 07/07/2023 0706   MCH 28.1 07/07/2023 0706   MCHC 31.3 07/07/2023 0706   RDW 15.6 (H) 07/07/2023 0706   LYMPHSABS 2.1 07/06/2023 1422   MONOABS 0.5  07/06/2023 1422   EOSABS 0.4 07/06/2023 1422   BASOSABS 0.1 07/06/2023 1422    BMET    Component Value Date/Time   NA 137 07/07/2023 0706   K 3.5 07/07/2023 0706   CL 107 07/07/2023 0706   CO2 23 07/07/2023 0706   GLUCOSE 106 (H) 07/07/2023 0706   BUN 12 07/07/2023 0706   CREATININE 0.82 07/07/2023 0706   CALCIUM 9.2 07/07/2023 0706   GFRNONAA >60 07/07/2023 0706    IMAGING past 24 hours EEG adult  Result Date: 07/07/2023 Charlsie Quest, MD     07/07/2023  8:08 AM Patient Name: Yolanda [name] MRN: [id number] Epilepsy Attending: Charlsie Quest Referring Physician/Provider: Milon Dikes, MD Date: 07/06/2023 Duration: 20.21 mins Patient history: 89 year old woman past history of dementia, recent subarachnoid hemorrhage secondary to a ruptured left MCA aneurysm status post coil embolization and multiple other aneurysms, developed encephalopathy and agitation while in the hospital discharged from rehab on 07/05/2023 brought back after family noticed sudden onset of right sided weakness. EEG to evaluate for seizure Level of alertness: Asleep AEDs during EEG study: TPM Technical aspects: This EEG study was done with scalp electrodes positioned according to the 10-20 International system of electrode placement. Electrical activity was reviewed with band pass filter of 1-70Hz , sensitivity of 7 uV/mm, display speed of 59mm/sec with a 60Hz  notched filter applied as appropriate. EEG data were recorded continuously and digitally stored.  Video monitoring was available and reviewed as appropriate. Description: Sleep was characterized by vertex waves, sleep spindles (12 to 14 Hz), maximal frontocentral region. Hyperventilation and photic stimulation  were not performed.   IMPRESSION: This study during sleep only is within normal limits. No seizures or epileptiform discharges were seen throughout the recording. A normal interictal EEG does not exclude nor support the diagnosis of epilepsy. Charlsie Quest    DG Chest Port 1 View  Result Date: 07/06/2023 CLINICAL DATA:  Altered level of consciousness, facial droop EXAM: PORTABLE CHEST 1 VIEW COMPARISON:  06/18/2023 FINDINGS: Single frontal view of the chest demonstrates an enlarged cardiac silhouette. Lung volumes are diminished, with crowding the central vasculature. Chronic interstitial scarring, without acute airspace disease, effusion, or pneumothorax. No acute bony abnormalities. IMPRESSION: 1. Low lung volumes.  No acute process. Electronically Signed   By: Sharlet Salina M.D.   On: 07/06/2023 18:52   MR BRAIN WO CONTRAST  Result Date: 07/06/2023 CLINICAL DATA:  Headache.  Neuro deficit. EXAM: MRI HEAD WITHOUT CONTRAST TECHNIQUE: Multiplanar, multiecho pulse sequences of the brain and surrounding structures were obtained without intravenous contrast. COMPARISON:  CT head without contrast 07/03/2023 and 07/06/2023. FINDINGS: Brain: The axial diffusion-weighted images are distorted by nasal jewelry. Coronal images demonstrate acute/subacute infarcts of the left frontal operculum and basal ganglia. T1 weighted images demonstrate some shortening in the left frontal operculum consistent with cortical laminar necrosis. This is consistent with both acute and subacute infarcts. Remote subarachnoid blood products are present. No new hemorrhage is present. The left MCA coil mass is noted. No significant mass effect or midline shift is present. The ventricles are of normal size. The brainstem and cerebellum are within normal limits. The internal auditory canals are within normal limits. Enlarged empty sella is present. Midline structures are otherwise unremarkable. Vascular: Flow is present in the major intracranial arteries. 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 Skull and upper cervical spine: The craniocervical junction is normal. Upper cervical spine is within normal limits. Marrow signal is unremarkable. Sinuses/Orbits: The paranasal sinuses and mastoid air  cells are clear. The globes and orbits are within normal limits. IMPRESSION: 1. 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 2. Remote subarachnoid blood products are present. No new hemorrhage is present. 3. 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 4. Enlarged empty sella. This is nonspecific, but can be seen in the setting of idiopathic intracranial hypertension. 5. The axial diffusion-weighted images are distorted by nasal jewelry. 6. Coronal images demonstrate acute/subacute infarcts of the left frontal operculum and basal ganglia. Electronically Signed   By: Marin Roberts M.D.   On: 07/06/2023 17:28   CT ANGIO HEAD NECK W WO CM (CODE STROKE)  Result Date: 07/06/2023 CLINICAL DATA:  Neuro deficit, acute, stroke suspected. Recent subarachnoid hemorrhage. Confusion. Aphasia. EXAM: CT ANGIOGRAPHY HEAD AND NECK WITH AND WITHOUT CONTRAST TECHNIQUE: Multidetector CT imaging of the head and neck was performed using the standard protocol during bolus administration of intravenous contrast. Multiplanar CT image reconstructions and MIPs were obtained to evaluate the vascular anatomy. Carotid stenosis measurements (when applicable) are obtained utilizing NASCET criteria, using the distal internal carotid diameter as the denominator. RADIATION DOSE REDUCTION: This exam was performed according to the departmental dose-optimization program which includes automated exposure control, adjustment of the mA and/or kV according to patient size and/or use of iterative reconstruction technique. CONTRAST:  75mL OMNIPAQUE IOHEXOL 350 MG/ML SOLN COMPARISON:  CT head without contrast 07/06/2023. CT angio head and neck 06/10/2023. FINDINGS: CTA NECK FINDINGS Aortic arch: Atherosclerotic calcifications are again noted at the aortic arch and great vessel origins without focal stenosis or aneurysm. Right carotid system: The  cervical scratched at the right common carotid artery is within normal limits.  Minimal atherosclerotic irregularity is present at the bifurcation without significant stenosis. Mild tortuosity is present in mid cervical right ICA without significant stenosis. Left carotid system: Mild tortuosity is present in the left common carotid artery. Atherosclerotic changes are present bifurcation without significant stenosis. Mild tortuosity is again noted in the cervical left ICA without significant stenosis. Vertebral arteries: The vertebral arteries are codominant. Both vertebral arteries originate from the subclavian arteries. Moderate right-sided stenosis is again noted. No other significant stenosis is present in either vertebral artery in the neck. Skeleton: Vertebral body heights and alignment are normal. No focal osseous lesions are present. The patient is edentulous. Other neck: Overall the study is mildly degraded by patient motion. No significant interval change is present. Heterogeneous thyroid is noted. A dominant 18 mm nodule is again noted within the left lobe of the thyroid. Upper chest: Patchy airspace opacities are present posteriorly in the lungs bilaterally, right greater than left. No nodular airspace disease present. The heart is enlarged. Review of the MIP images confirms the above findings CTA HEAD FINDINGS Anterior circulation: The internal carotid arteries are within normal limits through the ICA termini. The A1 and M1 segments are normal. A large coil mass is now present at the left MCA bifurcation. A 9 mm right MCA bifurcation aneurysm with a wide neck is again noted. M2 segments originate from the base of the aneurysm. ACA and MCA branch vessels are within normal limits bilaterally. Posterior circulation: The vertebral arteries are codominant. Vertebrobasilar junction basilar artery is normal. The right posterior cerebral artery originates from basilar tip. The left posterior cerebral artery is of fetal type. 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  The PCA branch vessels are normal bilaterally. Venous sinuses: The dural sinuses are patent. The straight sinus and deep cerebral veins are intact. Cortical veins are within normal limits. No significant vascular malformation is evident. Anatomic variants: Fetal type left posterior cerebral artery. Review of the MIP images confirms the above findings IMPRESSION: 1. Large coil mass now present at the left MCA bifurcation. 2. Stable 9 mm right MCA bifurcation aneurysm with a wide neck. M2 segments originate from the base of the aneurysm. 3. 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 4. 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 5. Stable mild atherosclerotic changes at the carotid bifurcations without significant stenosis. 6. Patchy airspace opacities posteriorly in the lungs bilaterally, right greater than left. This may represent atelectasis or infection. 7.  Aortic Atherosclerosis (ICD10-I70.0). The above was relayed via text pager to Dr. Milon Dikes on 07/06/2023 at 15:00 . Electronically Signed   By: Marin Roberts M.D.   On: 07/06/2023 15:00   CT HEAD CODE STROKE WO CONTRAST  Result Date: 07/06/2023 CLINICAL DATA:  Code stroke. Neuro deficit, acute, stroke suspected. Recent subarachnoid hemorrhage treated with endovascular obliteration. EXAM: CT HEAD WITHOUT CONTRAST TECHNIQUE: Contiguous axial images were obtained from the base of the skull through the vertex without intravenous contrast. RADIATION DOSE REDUCTION: This exam was performed according to the departmental dose-optimization program which includes automated exposure control, adjustment of the mA and/or kV according to patient size and/or use of iterative reconstruction technique. COMPARISON:  CT head without contrast 07/03/2023 FINDINGS: Brain: Accounting for streak artifact in the left MCA territory, no acute infarct, hemorrhage or mass lesion is present. Mild atrophy and white matter changes are  likely within normal limits for age.  Deep brain nuclei are within normal limits. The ventricles are of normal size. No significant extraaxial fluid collection is present. Vascular: Coil mass is present in the left MCA bifurcation. No significant vascular calcifications are present. No hyperdense vessel is present. Skull: No significant extracranial soft tissue lesion is present. Calvarium is intact. No focal lytic or blastic lesions are present. Sinuses/Orbits: The paranasal sinuses and mastoid air cells are clear. The globes and orbits are within normal limits. ASPECTS [facility] Stroke Program Early CT Score) - Ganglionic level infarction (caudate, lentiform nuclei, internal capsule, insula, M1-M3 cortex): 7/7 - Supraganglionic infarction (M4-M6 cortex): 3/3 Total score (0-10 with 10 being normal): 10/10 IMPRESSION: 1. No acute intracranial abnormality. 2. Coil mass in the left MCA bifurcation. 3. Mild atrophy and white matter changes are likely within normal limits for age. 4. Aspects is 10/10. The above was relayed via text pager to Dr. Wilford Corner on 07/06/2023 at 14:33 . Electronically Signed   By: Marin Roberts M.D.   On: 07/06/2023 14:34    Vitals:   07/06/23 2000 07/07/23 0000 07/07/23 0625 07/07/23 0800  BP: 125/60 (!) 123/106 110/64 100/79  Pulse: 78 98 90 88  Resp: 20 20 20 16   Temp: 98 F (36.7 C) 97.6 F (36.4 C) 98 F (36.7 C) 98.2 F (36.8 C)  TempSrc:  Axillary  Axillary  SpO2: 95% 98% 100% 97%  Weight:      Height:         PHYSICAL EXAM General: Lethargic patient laying in bed, minimally responsive to conversation Psych: Unable to assess CV: Regular rate and rhythm on monitor Respiratory:  Regular, unlabored respirations on room air GI: Abdomen soft and nontender   NEURO:  Mental Status: Unable to assess Speech/Language: Patient has full expressive and some receptive aphasia, can follow some midline commands  Cranial Nerves:  II: Unable to assess III, IV, VI: EOMI.  Eyelids elevate symmetrically.  V: Unable to assess VII: Some minimal right facial droop VIII: hearing intact to voice. IX, X: Unable to assess XI: unable to assess XII: tongue is midline without fasciculations. Motor: Gross right-sided weakness Tone: is normal and bulk is normal Sensation-minimal withdrawing to pain on the right side coordination:Unable to assess Gait- deferred   ASSESSMENT/PLAN This is a very ill-appearing woman approaching the end of her ninth decade after an extended stay in the hospital and a long physical rehabilitation course.  Family has decided to continue to monitor patient's status in the hospital over the next few days, but have at times requested to allow her to be taken home, where she can be made comfortable.  Begun on IV normal saline 75 cc/hour.  DNR/DNI.  They understand that patient cannot take food by mouth, however they do not want invasive procedures for feeding, such as an NG tube.  Family stated multiple times that patient would not like this process to be unnecessarily drawn out.  We will continue to follow.  Stroke: left MCA infarct with recent left MCA aneurysm coiling, etiology unclear, ? stent thrombosis vs ? vasospasm Code Stroke CT head: No acute abnormality. ASPECTS 10.    CTA head & neck: Large coil mass now present and left MCA bifurcation, stable 9 mm right MCA bifurcation aneurysm with white neck, stable 4 mm right PCA aneurysm, stable moderate stenosis of right vertebral artery MRI: Acute/subacute infarct involving left frontal operculum and basal gangli Recommended cerebral angio with NSG but family declined due to potential hospice Transcranial Doppler pending EEG no seizure  VTE prophylaxis -subcu Lovenox 40 mg No antithrombotic prior to admission, now on ASA 300 PR Therapy recommendations - SNF Disposition: TBD. Family is considering home hospice if pt has no significant improvement in the next 1-2 days.   Cerebral  aneurysms 06/10/2023 admitted for headache, right facial droop and right-sided weakness.  CT showed diffuse SAH.  CTA head and neck showed left MCA bifurcation large 19mm aneurysm, right MCA 9 mm and right P-comm 5 mm aneurysm.  Status post coil embolization same day.  Postop, patient had encephalopathy, delirium, agitation, was intubated.  TCD no vasospasm.  CT repeat stable SAH.  LDL 132, A1c 5.8.  2D echo EF 70 to 75%. Patient was later extubated and discharged to CIR. During CIR stay, patient continued to complain of headache, Topamax added and increased to 100 mg nightly.  Fioricet as needed.  Bilateral lower extremity Doppler showed no DVT.  Amlodipine completed 9/9.  Worsening headache 9/10, CT 9/11 shows stable awaiting SAH.  Patient discharged on 9/13.  Hypertension Home meds: Bisoprolol, held Stable now BP goal less than 160 Long-term BP goal normotensive  Hyperlipidemia Home meds: None LDL 132, goal < 70 No po access now  Dysphagia Patient has post-stroke dysphagia, SLP consulted N.p.o. for now Family declined core track On IVF  Other Stroke Risk Factors Advanced age  Other acute issues Dementia  Hospital day # 1  Luiz Iron, MD Psychiatry Resident, PGY-1  ATTENDING NOTE: I reviewed above note and agree with the assessment and plan. Pt was seen and examined.   Family is at the bedside.  Granddaughter served as Equities trader.  Patient lying in bed, eyes open on voice, nonverbal, seems able to follow some midline commands but not peripheral commands.  Blinking to visual threat on the left but inconsistent on the right.  Mild right facial droop.  Left upper and lower extremities functional movement, right upper and lower extremity flaccid. Sensation, coordination not corporative and gait not tested.  Patient had recent left MCA large aneurysm coiling, current stroke on the left MCA territory, concerning for left MCA coil thrombosis versus vasospasm.  Patient has constant  headache during CIR stay, raising concern for likely vasospasm.  TCD pending.  However, patient family now considering home hospice, declined cerebral angiogram and core track.  Put on aspirin 325.  PT and OT recommend SNF.  Will follow.  For detailed assessment and plan, please refer to above/below as I have made changes wherever appropriate.   Marvel Plan, MD PhD Stroke Neurology 07/07/2023 3:22 PM    To contact Stroke Continuity provider, please refer to [email or web address]. After hours, contact General Neurology

## 2023-07-07 NOTE — Evaluation (Signed)
Physical Therapy Evaluation Patient Details Name: Yolanda [name] MRN: [id number] DOB: Mar 01, 1933 Today's Date: 07/07/2023  History of Present Illness  Pt is an 89 y.o. Nepali speaking female who presented 07/06/23 with AMS (drooling out of right side of mouth, not responding, eyes rolling, unable to speak or recognize family. 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 PMH: dementia, asthma, OA, HTN, 06/10/23 subarachnoid hemorrhage more prominent in her left sylvian fissure.  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 S/p coil embolization of L MCA aneurysm.D/C 9/5 to rehab, D/C home 9/13.  Clinical Impression   This 89 yo female admitted with above (and recently with other medical issues as well) presents to acute PT from home after a short rehab stay. At home (for on day after rehab) pt was ambulating with RW with A/S of family, talking, and A'ing some with her basic ADLs. Currently she is total A at bed mobiltiy and total A +2 for transfers with or without a hoyer lift. She will continue to benefit from a trial of PT with follow up from continued inpatient follow up therapy, <3 hours/day OR trial of HHPT. Will benefit form acute PT to maximize independence and safety with mobility and ADLs and to faciliate DC planning in alignment with Goals of Care      If plan is discharge home, recommend the following: Two people to help with walking and/or transfers;Two people to help with bathing/dressing/bathroom;Assistance with cooking/housework;Direct supervision/assist for medications management;Direct supervision/assist for financial management;Assistance with feeding;Assist for transportation;Help with stairs or ramp for entrance;Supervision due to cognitive status   Can travel by private vehicle   No    Equipment Recommendations Hoyer lift;Hospital  bed;Wheelchair cushion (measurements PT)  Recommendations for Other Services       Functional Status Assessment Patient has had a recent decline in their functional status and/or demonstrates limited ability to make significant improvements in function in a reasonable and predictable amount of time     Precautions / Restrictions Precautions Precautions: Fall Precaution Comments: SBP goal <160 Restrictions Weight Bearing Restrictions: No      Mobility  Bed Mobility Overal bed mobility: Needs Assistance Bed Mobility: Rolling, Sidelying to Sit Rolling: Total assist, +2 for physical assistance Sidelying to sit: +2 for physical assistance, +2 for safety/equipment       General bed mobility comments: 2 person physical assist for all aspects of mobilty getting up to EOB    Transfers Overall transfer level: Needs assistance Equipment used: 2 person hand held assist Transfers: Bed to chair/wheelchair/BSC       Squat pivot transfers: Total assist, +2 physical assistance (use of bed pad)     General transfer comment: squat pivot bed to recliner transfer with armrest down; bil knees gently blocked for stability    Ambulation/Gait                  Stairs            Wheelchair Mobility     Tilt Bed    Modified Rankin (Stroke Patients Only) Modified Rankin (Stroke Patients Only) Pre-Morbid Rankin Score: Moderately severe disability Modified Rankin: Severe disability     Balance Overall balance assessment: Needs assistance Sitting-balance support: Single extremity supported, Feet unsupported (pt too short for bed for feet to reach floor and her still be sitting on bed) Sitting balance-Leahy Scale: Zero Sitting balance -  Comments: pushes with LUE (to right and posterioly)                                     Pertinent Vitals/Pain Pain Assessment Pain Assessment: Faces Faces Pain Scale: Hurts little more Pain Location: Per son she has Bil  knee pain at baseline Pain Intervention(s): Monitored during session    Home Living Family/patient expects to be discharged to:: Private residence Living Arrangements: Children;Other relatives Available Help at Discharge: Family;Available 24 hours/day Type of Home: House Home Access: Stairs to enter   [employer] of Steps: 3   Home Layout: Two level;Able to live on main level with bedroom/bathroom Home Equipment: [employer] - single point;Shower seat      Prior Function Prior Level of Function : Needs assist  Cognitive Assist : Mobility (cognitive);ADLs (cognitive) Mobility (Cognitive): Intermittent cues ADLs (Cognitive): Intermittent cues Physical Assist : Mobility (physical);ADLs (physical) Mobility (physical): Bed mobility;Transfers;Gait;Stairs ADLs (physical): Grooming;Bathing;Dressing;Toileting Mobility Comments: Needed A and someone with her when up and about at home ADLs Comments: Needed A for ADLs     Extremity/Trunk Assessment   Upper Extremity Assessment Upper Extremity Assessment: Defer to OT evaluation RUE Deficits / Details: flaccid with 2 finger sublux RUE: Subluxation noted RUE Coordination: decreased fine motor;WNL LUE Deficits / Details: moving spontaneously and maybe once to command (squeeze hand-with son interpreting)    Lower Extremity Assessment Lower Extremity Assessment: RLE deficits/detail RLE Deficits / Details: Likely poor sensation/proprioception as pt would often get her R leg stuck under her L and seemed unaware of it, but difficult to formally assess  with impaired cognition; activation of quads noted to resist therapist PROM at times, but overall weak compared to L    Cervical / Trunk Assessment Cervical / Trunk Assessment: Other exceptions Cervical / Trunk Exceptions: short stature  Communication   Communication Communication: Other (comment) (speaks nepali) Cueing Techniques: Verbal cues;Gestural cues;Tactile cues;Visual cues   Cognition Arousal: Lethargic Behavior During Therapy: Flat affect Overall Cognitive Status: Impaired/Different from baseline (but has history of impairment at baseline) Area of Impairment: Attention, Following commands, Safety/judgement, Awareness, Problem solving                   Current Attention Level: Focused   Following Commands: Follows one step commands with increased time, Follows one step commands inconsistently Safety/Judgement: Decreased awareness of safety, Decreased awareness of deficits Awareness: Intellectual Problem Solving: Slow processing, Difficulty sequencing, Requires verbal cues, Requires tactile cues, Decreased initiation          General Comments General comments (skin integrity, edema, etc.): Session conducted on room air and O2 sats 96% at end of session; HR 82, BP sitting EOB 91/79, MAP 85    Exercises     Assessment/Plan    PT Assessment Patient needs continued PT services  PT Problem List Decreased strength;Decreased activity tolerance;Decreased balance;Decreased mobility;Decreased coordination;Decreased cognition;Decreased safety awareness;Decreased knowledge of use of DME;Cardiopulmonary status limiting activity;Impaired sensation       PT Treatment Interventions DME instruction;Gait training;Functional mobility training;Therapeutic activities;Therapeutic exercise;Balance training;Neuromuscular re-education;Cognitive remediation;Patient/family education    PT Goals (Current goals can be found in the Care Plan section)  Acute Rehab PT Goals Patient Stated Goal: Unable to state; per Dr. Andres Ege note, family does not desire aggressive care PT Goal Formulation: Patient unable to participate in goal setting Time For Goal Achievement: 07/21/23 Potential to Achieve Goals: Fair    Frequency Min 1X/week  Co-evaluation PT/OT/SLP Co-Evaluation/Treatment: Yes Reason for Co-Treatment: For patient/therapist safety PT goals addressed during  session: Mobility/safety with mobility;Balance;Strengthening/ROM OT goals addressed during session: Strengthening/ROM       AM-PAC PT "6 Clicks" Mobility  Outcome Measure Help needed turning from your back to your side while in a flat bed without using bedrails?: Total Help needed moving from lying on your back to sitting on the side of a flat bed without using bedrails?: Total Help needed moving to and from a bed to a chair (including a wheelchair)?: Total Help needed standing up from a chair using your arms (e.g., wheelchair or bedside chair)?: Total Help needed to walk in hospital room?: Total Help needed climbing 3-5 steps with a railing? : Total 6 Click Score: 6    End of Session Equipment Utilized During Treatment: Other (comment) (bed pad) Activity Tolerance: Patient tolerated treatment well (NAD on room air) Patient left: in chair;with call bell/phone within reach;with chair alarm set (Maximove pad under pt) Nurse Communication: Mobility status;Need for lift equipment PT Visit Diagnosis: Muscle weakness (generalized) (M62.81);Difficulty in walking, not elsewhere classified (R26.2);Other symptoms and signs involving the nervous system (R29.898);Unsteadiness on feet (R26.81);Other abnormalities of gait and mobility (R26.89)    Time: 3474-2595 PT Time Calculation (min) (ACUTE ONLY): 25 min   Charges:   PT Evaluation $PT Eval Moderate Complexity: 1 Mod   PT General Charges $$ ACUTE PT VISIT: 1 Visit         Van Clines, PT  Acute Rehabilitation Services Office [phone] Secure Chat welcomed   Levi Aland 07/07/2023, 12:55 PM

## 2023-07-07 NOTE — Evaluation (Signed)
Occupational Therapy Evaluation Patient Details Name: Yolanda [name] MRN: [id number] DOB: 1933-08-03 Today's Date: 07/07/2023   History of Present Illness Pt is an 89 y.o. Nepali speaking female who presented 07/06/23 with AMS (drooling out of right side of mouth, not responding, eyes rolling, unable to speak or recognize family. 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 PMH: dementia, asthma, OA, HTN, 06/10/23 subarachnoid hemorrhage more prominent in her left sylvian fissure.  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 S/p coil embolization of L MCA aneurysm.D/C 9/5 to rehab, D/C home 9/13.   Clinical Impression   This 89 yo female admitted with above (and recently with other medical issues as well) presents to acute OT from home after a short rehab stay. At home (for on day after rehab) pt was ambulating with RW with A/S of family, talking, and A'ing some with her basic ADLs. Currently she is total A at bed level for basic ADLs and total A +2 for transfers with or without a hoyer lift. She will continue to benefit from a trial of OT with follow up from continued inpatient follow up therapy, <3 hours/day OR trial of HHOT. We will continue to follow.       If plan is discharge home, recommend the following: Two people to help with walking and/or transfers;Two people to help with bathing/dressing/bathroom;Assistance with cooking/housework;Assistance with feeding;Help with stairs or ramp for entrance;Assist for transportation;Direct supervision/assist for financial management;Direct supervision/assist for medications management    Functional Status Assessment  Patient has had a recent decline in their functional status and demonstrates the ability to make significant improvements in function in a reasonable and predictable amount of time.  Equipment  Recommendations  [employer];Hospital bed       Precautions / Restrictions Precautions Precautions: Fall Precaution Comments: SBP goal <160 Restrictions Weight Bearing Restrictions: No      Mobility Bed Mobility Overal bed mobility: Needs Assistance Bed Mobility: Rolling, Sidelying to Sit Rolling: Total assist, +2 for physical assistance Sidelying to sit: +2 for physical assistance, +2 for safety/equipment            Transfers Overall transfer level: Needs assistance Equipment used: 2 person hand held assist Transfers: Bed to chair/wheelchair/BSC     Squat pivot transfers: Total assist, +2 physical assistance (use of bed pad)              Balance Overall balance assessment: Needs assistance Sitting-balance support: Single extremity supported, Feet unsupported (pt too short for bed for feet to reach floor and her still be sitting on bed) Sitting balance-Leahy Scale: Zero Sitting balance - Comments: pushes with LUE (to right and posterioly)                                   ADL either performed or assessed with clinical judgement   ADL Overall ADL's : Needs assistance/impaired                                       General ADL Comments: total A at bed level     Vision Baseline Vision/History: 0 No visual deficits Ability to See in Adequate Light: 0 Adequate Additional Comments: when her son asked him to look  at her she shook her head no, when he asked her if she could see him she shook her head no (son standing on her right). She tended to keep her eyes closed, but would open them when asked "look at me" but would only open them, not look in direction she was asked to            Pertinent Vitals/Pain Pain Assessment Pain Location: Per son she has Bil knee pain at baseline     Extremity/Trunk Assessment Upper Extremity Assessment Upper Extremity Assessment: Right hand dominant;RUE deficits/detail RUE Deficits / Details:  flaccid with 2 finger sublux RUE: Subluxation noted RUE Coordination: decreased fine motor;WNL LUE Deficits / Details: moving spontaneously and maybe once to command (squeeze hand-with son interpreting)           Communication Communication Communication: Other (comment) (speaks nepali) Cueing Techniques: Verbal cues;Gestural cues;Tactile cues;Visual cues   Cognition Arousal: Lethargic Behavior During Therapy: Flat affect Overall Cognitive Status: Impaired/Different from baseline (but has history of impairment at baseline) Area of Impairment: Attention, Following commands, Safety/judgement, Awareness, Problem solving                   Current Attention Level: Focused   Following Commands: Follows one step commands with increased time, Follows one step commands inconsistently Safety/Judgement: Decreased awareness of safety, Decreased awareness of deficits Awareness: Intellectual Problem Solving: Slow processing, Difficulty sequencing, Requires verbal cues, Requires tactile cues, Decreased initiation       General Comments  See PT note for vitals            Home Living Family/patient expects to be discharged to:: Private residence Living Arrangements: Children;Other relatives Available Help at Discharge: Family;Available 24 hours/day Type of Home: House Home Access: Stairs to enter [employer] of Steps: 3   Home Layout: Two level;Able to live on main level with bedroom/bathroom     Bathroom Shower/Tub: Tub/shower unit;Walk-in shower (on other level)   Firefighter: Standard     Home Equipment: Cane - single point;Shower seat      Lives With: Son    Prior Functioning/Environment Prior Level of Function : Needs assist  Cognitive Assist : Mobility (cognitive);ADLs (cognitive) Mobility (Cognitive): Intermittent cues ADLs (Cognitive): Intermittent cues Physical Assist : Mobility (physical);ADLs (physical) Mobility (physical): Bed  mobility;Transfers;Gait;Stairs ADLs (physical): Grooming;Bathing;Dressing;Toileting Mobility Comments: Needed A and someone with her when up and about at home ADLs Comments: Needed A for ADLs        OT Problem List: Decreased strength;Decreased range of motion;Decreased activity tolerance;Impaired balance (sitting and/or standing);Impaired vision/perception;Decreased coordination;Decreased cognition;Decreased safety awareness;Decreased knowledge of use of DME or AE;Impaired tone;Impaired UE functional use;Pain      OT Treatment/Interventions: Self-care/ADL training;Neuromuscular education;DME and/or AE instruction;Balance training;Patient/family education    OT Goals(Current goals can be found in the care plan section) Acute Rehab OT Goals Patient Stated Goal: unable to state OT Goal Formulation: Patient unable to participate in goal setting (family had left room to talk with MD and other family entered room as we were leaving) Time For Goal Achievement: 07/21/23  OT Frequency: Min 1X/week    Co-evaluation   Reason for Co-Treatment: For patient/therapist safety PT goals addressed during session: Mobility/safety with mobility;Balance;Strengthening/ROM OT goals addressed during session: Strengthening/ROM      AM-PAC OT "6 Clicks" Daily Activity     Outcome Measure Help from another person eating meals?: Total Help from another person taking care of personal grooming?: Total Help from another person toileting, which includes  using toliet, bedpan, or urinal?: Total Help from another person bathing (including washing, rinsing, drying)?: Total Help from another person to put on and taking off regular upper body clothing?: Total Help from another person to put on and taking off regular lower body clothing?: Total 6 Click Score: 6   End of Session Nurse Communication: Mobility status;Need for lift equipment  Activity Tolerance: Patient limited by lethargy Patient left: in chair;with  call bell/phone within reach;with chair alarm set;with family/visitor present  OT Visit Diagnosis: Unsteadiness on feet (R26.81);Other abnormalities of gait and mobility (R26.89);Muscle weakness (generalized) (M62.81);Low vision, both eyes (H54.2);Other symptoms and signs involving cognitive function;Cognitive communication deficit (R41.841);Hemiplegia and hemiparesis;Pain Symptoms and signs involving cognitive functions: Cerebral infarction Hemiplegia - Right/Left: Right Hemiplegia - dominant/non-dominant: Dominant Hemiplegia - caused by: Cerebral infarction Pain - part of body:  (maybe both knees (per son, chronic pain))                Time: 0945-1010 OT Time Calculation (min): 25 min Charges:  OT General Charges $OT Visit: 1 Visit OT Evaluation $OT Eval Moderate Complexity: 1 Mod  Cathy L. OT Acute Rehabilitation Services Office [phone]    Evette Georges 07/07/2023, 11:07 AM

## 2023-07-07 NOTE — Progress Notes (Signed)
Patient noted to have increased agitation and attempting to get out of the bed.  Family at  bedside unable to redirect/reorient patient.  Order for non violent restraints started at this time with soft wrist restraint to left wrist only.  Family and patient educated on use of restraints by staff RN that speaks their language.  All concerns addressed and questions answered to the satisfaction of the family.

## 2023-07-07 NOTE — Progress Notes (Signed)
Asked by nursing staff to talk to family again Spoke with son and multiple other relatives at bedside-they have had a chance to discuss amongst themselves.  Since she has had another setback-family does not desire aggressive care in case she does not improve.  They did not want PEG tube or  even NG tube.  Plan at this point is to watch/observe for any improvement-if not-family would like to take patient home with hospice care.  Family agreeable with DNR order.

## 2023-07-08 ENCOUNTER — Inpatient Hospital Stay (HOSPITAL_COMMUNITY): Payer: Medicare Other

## 2023-07-08 DIAGNOSIS — Z8679 Personal history of other diseases of the circulatory system: Secondary | ICD-10-CM | POA: Diagnosis not present

## 2023-07-08 DIAGNOSIS — I63512 Cerebral infarction due to unspecified occlusion or stenosis of left middle cerebral artery: Secondary | ICD-10-CM | POA: Diagnosis not present

## 2023-07-08 LAB — CBC
HCT: 38.6 % (ref 36.0–46.0)
Hemoglobin: 12.2 g/dL (ref 12.0–15.0)
MCH: 28.1 pg (ref 26.0–34.0)
MCHC: 31.6 g/dL (ref 30.0–36.0)
MCV: 88.9 fL (ref 80.0–100.0)
Platelets: 184 10*3/uL (ref 150–400)
RBC: 4.34 MIL/uL (ref 3.87–5.11)
RDW: 15.5 % (ref 11.5–15.5)
WBC: 6.1 10*3/uL (ref 4.0–10.5)
nRBC: 0 % (ref 0.0–0.2)

## 2023-07-08 LAB — BASIC METABOLIC PANEL
Anion gap: 8 (ref 5–15)
BUN: 13 mg/dL (ref 8–23)
CO2: 21 mmol/L — ABNORMAL LOW (ref 22–32)
Calcium: 8.8 mg/dL — ABNORMAL LOW (ref 8.9–10.3)
Chloride: 109 mmol/L (ref 98–111)
Creatinine, Ser: 0.73 mg/dL (ref 0.44–1.00)
GFR, Estimated: >60 mL/min (ref >60)
Glucose, Bld: 95 mg/dL (ref 70–99)
Potassium: 3.4 mmol/L — ABNORMAL LOW (ref 3.5–5.1)
Sodium: 138 mmol/L (ref 135–145)

## 2023-07-08 LAB — PROCALCITONIN: Procalcitonin: 0.16 ng/mL

## 2023-07-08 MED ORDER — ASPIRIN 81 MG PO CHEW
81.0000 mg | CHEWABLE_TABLET | Freq: Every day | ORAL | Status: DC
  Administered 2023-07-08 – 2023-07-10 (×3): 81 mg via ORAL
  Filled 2023-07-08 (×3): qty 1

## 2023-07-08 MED ORDER — POTASSIUM CHLORIDE 10 MEQ/100ML IV SOLN
10.0000 meq | INTRAVENOUS | Status: AC
  Administered 2023-07-08 (×2): 10 meq via INTRAVENOUS
  Filled 2023-07-08 (×2): qty 100

## 2023-07-08 MED ORDER — ROSUVASTATIN CALCIUM 20 MG PO TABS
20.0000 mg | ORAL_TABLET | Freq: Every day | ORAL | Status: DC
  Administered 2023-07-08 – 2023-07-10 (×3): 20 mg via ORAL
  Filled 2023-07-08 (×3): qty 1

## 2023-07-08 NOTE — Plan of Care (Signed)
  Problem: Education: Goal: Knowledge of disease or condition will improve Outcome: Not Applicable Goal: Knowledge of secondary prevention will improve (MUST DOCUMENT ALL) Outcome: Not Applicable Goal: Knowledge of patient specific risk factors will improve Loraine Leriche N/A or DELETE if not current risk factor) Outcome: Not Applicable   Problem: Spontaneous Subarachnoid Hemorrhage Tissue Perfusion: Goal: Complications of Spontaneous Subarachnoid Hemorrhage will be minimized Outcome: Not Applicable   Problem: Coping: Goal: Will verbalize positive feelings about self Outcome: Not Applicable Goal: Will identify appropriate support needs Outcome: Not Applicable   Problem: Health Behavior/Discharge Planning: Goal: Ability to manage health-related needs will improve Outcome: Not Applicable Goal: Goals will be collaboratively established with patient/family Outcome: Not Applicable   Problem: Self-Care: Goal: Ability to participate in self-care as condition permits will improve Outcome: Not Applicable Goal: Verbalization of feelings and concerns over difficulty with self-care will improve Outcome: Not Applicable Goal: Ability to communicate needs accurately will improve Outcome: Not Applicable   Problem: Nutrition: Goal: Risk of aspiration will decrease Outcome: Not Applicable Goal: Dietary intake will improve Outcome: Not Applicable   Problem: Education: Goal: Knowledge of General Education information will improve Description: Including pain rating scale, medication(s)/side effects and non-pharmacologic comfort measures Outcome: Not Applicable   Problem: Health Behavior/Discharge Planning: Goal: Ability to manage health-related needs will improve Outcome: Not Applicable   Problem: Clinical Measurements: Goal: Ability to maintain clinical measurements within normal limits will improve Outcome: Not Applicable Goal: Will remain free from infection Outcome: Not Applicable Goal:  Diagnostic test results will improve Outcome: Not Applicable Goal: Respiratory complications will improve Outcome: Not Applicable Goal: Cardiovascular complication will be avoided Outcome: Not Applicable   Problem: Activity: Goal: Risk for activity intolerance will decrease Outcome: Not Applicable   Problem: Nutrition: Goal: Adequate nutrition will be maintained Outcome: Not Applicable   Problem: Coping: Goal: Level of anxiety will decrease Outcome: Not Applicable   Problem: Elimination: Goal: Will not experience complications related to bowel motility Outcome: Not Applicable Goal: Will not experience complications related to urinary retention Outcome: Not Applicable   Problem: Pain Managment: Goal: General experience of comfort will improve Outcome: Not Applicable   Problem: Safety: Goal: Ability to remain free from injury will improve Outcome: Not Applicable   Problem: Skin Integrity: Goal: Risk for impaired skin integrity will decrease Outcome: Not Applicable   Problem: Safety: Goal: Non-violent Restraint(s) Outcome: Not Applicable

## 2023-07-08 NOTE — Progress Notes (Signed)
Patient returns to [id number] at this time

## 2023-07-08 NOTE — Progress Notes (Signed)
Speech Language Pathology Treatment: Dysphagia  Patient Details Name: Yolanda [name] MRN: [id number] DOB: 23-Oct-1932 Today's Date: 07/08/2023 Time: [id number] SLP Time Calculation (min) (ACUTE ONLY): 12 min  Assessment / Plan / Recommendation Clinical Impression  Pt much more alert this date, sitting EOB. She requires frequent cueing to accept PO presentations. With thin liquids, pt initially able to get sip via straw which resulted in immediate coughing. She was then unable to initiate a sip via straw and required total assist to use a cup sip of thin liquids, with which she had a significant bout of immediate coughing after the swallow. Trials of purees with prolonged bolus formation. Pt noted to use multiple audible swallows with each bolus consistency. Given pt's signs concerning for aspiration, recommend she remain NPO pending results of MBS which will be completed as scheduling allows. Per MD, pt's family eager to have more information regarding swallow function in order to ensure least restrictive diet recommendation upon d/c home.    HPI HPI: Yolanda [name] is an 89 yo female presenting to ED 9/14 with R sided weakness and speech difficulty. 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 Recently discharged from [facility] 9/13. Most recently seen by SLP 9/12 tolerating Dys 3 diet with thin liquids. PMH includes dementia, recent SAH secondary to L MCA aneurysmal rupture s/p coil embolization, multiple other aneurysms, developed encephalopathy and agitated delirium      SLP Plan  MBS      Recommendations for follow up therapy are one component of a multi-disciplinary discharge planning process, led by the attending physician.  Recommendations may be updated based on patient status, additional functional criteria and insurance authorization.    Recommendations  Diet recommendations: NPO Medication Administration: Crushed with puree                  Oral care  QID;Staff/trained caregiver to provide oral care   Frequent or constant Supervision/Assistance Dysphagia, unspecified (R13.10)     MBS     Gwynneth Aliment, M.A., CF-SLP Speech Language Pathology, Acute Rehabilitation Services  Secure Chat preferred [phone]   07/08/2023, 10:32 AM

## 2023-07-08 NOTE — Progress Notes (Signed)
Modified Barium Swallow Study  Patient Details  Name: Yolanda [name] MRN: [id number] Date of Birth: November 29, 1932  Today's Date: 07/08/2023  Modified Barium Swallow completed.  Full report located under Chart Review in the Imaging Section.  History of Present Illness Yolanda [name] is an 89 yo female presenting to ED 9/14 with R sided weakness and speech difficulty. 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 Recently discharged from [facility] 9/13. Most recently seen by SLP 9/12 tolerating Dys 3 diet with thin liquids. 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 The study was limtied by pt's excessive movement and difficulty maintaining adequate positioning. Pt consistently initiating the swallow at the level of the pyriform sinuses across all boluses. Trials of thin liquids resulted in misitiming of laryngeal vestibule closure with trace, transient penetration from the pyriform sinuses as the swallow is initiated. No further penetration/aspiration noted with trials of nectar thick liquids or purees. Pt with reduced labial seal, likely due to R sided weakness and resulting in significant anterior spillage. When presented with trials of solids, pt made no attempt to masticate or initiate swallow given Max verbal and visual cueing. Suspect cognition is inhibitng her ability to safely initiate a regular texture solid diet at this time, but feel that she shows potential to progress as she d/c and is exposed to familiar foods with family support. Recommend initiating a diet of Dys 1 textured solids with thin liquids at this time. Meds should be given whole in puree. Discussed results of MBS and diet recommendation as well as potential to upgrade with her son, who was present for the  entirety of the study. Will continue to follow. Factors that may increase risk of adverse event in presence of aspiration Yolanda [name] & Yolanda [name] 2021): Poor general health and/or compromised immunity;Reduced cognitive function;Frail or deconditioned  Swallow Evaluation Recommendations Recommendations: PO diet PO Diet Recommendation: Dysphagia 1 (Pureed);Thin liquids (Level 0) Liquid Administration via: Cup;Straw Medication Administration: Whole meds with puree Supervision: Staff to assist with self-feeding;Full supervision/cueing for swallowing strategies Swallowing strategies  : Minimize environmental distractions;Slow rate;Small bites/sips Postural changes: Stay upright 30-60 min after meals Oral care recommendations: Oral care BID (2x/day)    Yolanda [name], M.A., CF-SLP Speech Language Pathology, Acute Rehabilitation Services  Secure Chat preferred [phone]  07/08/2023,2:11 PM

## 2023-07-08 NOTE — Progress Notes (Addendum)
PROGRESS NOTE        PATIENT DETAILS Name: Yolanda [name] Age: 89 y.o. Sex: female Date of Birth: 10-01-1933 Admit Date: 07/06/2023 Admitting Physician Clydie Braun, MD PCP:Pcp, No  Brief Summary: Patient is a 89 y.o.  female with recent history of SAH-s/p left MCA aneurysm coiling-just discharged home from [facility] on 9/13-presented to the hospital on 9/14 with aphasia/right-sided weakness-she was found to have acute CVA and subsequently admitted to the hospitalist service.  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 9/15>> Spot EEG: No seizures  Significant microbiology data: 9/14>> COVID/influenza/RSV PCR: Negative  Procedures: None  Consults: Neurology  Subjective: Much more awake and alert-still aphasic-but seems to be able to follow questions/commandsbmuch more fluently today.  Per family-they have been giving her sips of water since yesterday-and they have not noted any coughing.  I gave her 3 sips of water through the straw-no coughing-she swallowed without any issues  Objective: Vitals: Blood pressure 133/70, pulse 91, temperature 98.1 F (36.7 C), temperature source Axillary, resp. rate 18, height 4\' 8"  (1.422 m), weight 54 kg, SpO2 98%.   Exam: Gen Exam:Alert awake-still of sick HEENT:atraumatic, normocephalic Chest: B/L clear to auscultation anteriorly CVS:S1S2 regular Abdomen:soft non tender, non distended Extremities:no edema Neurology: Able to move RLE side-to-side-able to just about keep the RUE up when I let her drop-she was not able to do any of these movements  yesterday. Skin: no rash  Pertinent Labs/Radiology:    Latest Ref Rng & Units 07/08/2023    3:46 AM 07/07/2023    7:06 AM 07/06/2023    2:26 PM  CBC  WBC 4.0 - 10.5 K/uL 6.1  7.6    Hemoglobin 12.0 - 15.0 g/dL 40.9  81.1  91.4   Hematocrit 36.0 - 46.0 % 38.6  39.6  40.0   Platelets 150 - 400 K/uL 184  215      Lab Results  Component Value Date   NA 138 07/08/2023   K 3.4 (L) 07/08/2023   CL 109 07/08/2023   CO2 21 (L) 07/08/2023      Assessment/Plan: Acute CVA Continues to have significant right-sided deficits but slightly better Remains aphasic But much more awake and alert Workup as above Neurosurgery/neurology following and unclear whether this is from delayed vasospasm from recent [facility] or thrombotic due to numerous aneurysms with wide base Continue aspirin  Addendum Discussed with Dr. Barnabas Lister the phone on 9/16-no role to treat left or right MCA aneurysm at this point.  He advises supportive care and ischemic stroke management per neurology.  Recent SAH-with call embolization of left MCA aneurysm CTA as above Appreciate neurosurgical input-continue to treat as ischemic stroke.  Dysphagia Likely secondary to acute CVA-superimposed on recent SAH Was on a dysphagia 3 diet prior to this hospitalization Seems to be much more awake and alert today-swallowed 3 sips of water without any major issues for me this morning-discussed with SLP-family will except some aspiration risk-will await further recommendations.  Acute metabolic encephalopathy likely combination of CVA-possible aspiration pneumonia Much awake and alert today compared  to yesterday  Possible aspiration pneumonia/atelectasis Patchy airspace opacities noted on CTA-likely reflecting aspiration-patient asymptomatic-hold off on starting antibiotics at this point. SLP evaluation ongoing  Chronic HFpEF Euvolemic  Hypokalemia Replete/recheck  HTN Permissive hypertension Bisoprolol on hold  Mild  intermittent asthma Bronchodilators  Migraine headaches As needed Tylenol for now Resume Topamax when able  History of urinary retention Flomax was started-during recent hospitalization Resume when oral intake permits.  Palliative care/goals of care Multiple questions with family yesterday and again this morning DNR Allow clinical outcomes for further goals of care discussion-as she seems much better compared to yesterday.  Numerous options including hospice at home-possibility of PEG tube placement-oral intake with accepting aspiration risk all discussed.  BMI: Estimated body mass index is 26.69 kg/m as calculated from the following:   Height as of this encounter: 4\' 8"  (1.422 m).   Weight as of this encounter: 54 kg.   Code status:   Code Status: Limited: Do not attempt resuscitation (DNR) -DNR-LIMITED -Do Not Intubate/DNI    DVT Prophylaxis: enoxaparin (LOVENOX) injection 40 mg Start: 07/06/23 2200   Family Communication: Son/Son in Social worker at bedside  Disposition Plan: Status is: Inpatient Remains inpatient appropriate because: Severity of illness   Planned Discharge Destination:Home   Diet: Diet Order             Diet NPO time specified  Diet effective now                     Antimicrobial agents: Anti-infectives (From admission, onward)    None        MEDICATIONS: Scheduled Meds:  aspirin  300 mg Rectal Daily   enoxaparin (LOVENOX) injection  40 mg Subcutaneous Q24H   ramelteon  8 mg Oral QHS   sertraline  25 mg Oral Daily   sodium chloride flush  3 mL Intravenous Q12H   tamsulosin  0.4 mg Oral QPC supper   topiramate  100 mg Oral QHS   traZODone  100 mg Oral QHS   [START ON 07/12/2023] Vitamin D (Ergocalciferol)  50,000 Units Oral Q7 days   Continuous Infusions:  sodium chloride 75 mL/hr at 07/07/23 1235   potassium chloride 10 mEq (07/08/23 0958)   PRN Meds:.acetaminophen **OR** acetaminophen, albuterol   I have personally reviewed  following labs and imaging studies  LABORATORY DATA: CBC: Recent Labs  Lab 07/06/23 1422 07/06/23 1426 07/07/23 0706 07/08/23 0346  WBC 6.5  --  7.6 6.1  NEUTROABS 3.4  --   --   --   HGB 12.4 13.6 12.4 12.2  HCT 40.1 40.0 39.6 38.6  MCV 91.6  --  89.6 88.9  PLT 202  --  215 184    Basic Metabolic Panel: Recent Labs  Lab 07/06/23 1426 07/06/23 1906 07/07/23 0706 07/08/23 0346  NA 138 138 137 138  K 6.0* 3.9 3.5 3.4*  CL 109 105 107 109  CO2  --  22 23 21*  GLUCOSE 100* 98 106* 95  BUN 16 11 12 13   CREATININE 0.70 0.78 0.82 0.73  CALCIUM  --  9.6 9.2 8.8*    GFR: Estimated Creatinine Clearance: 33.3 mL/min (by C-G formula based on SCr of 0.73 mg/dL).  Liver Function Tests: Recent Labs  Lab 07/06/23 1906  AST 20  ALT 29  ALKPHOS 80  BILITOT 0.3  PROT 7.4  ALBUMIN 3.7   No results for input(s): "LIPASE", "AMYLASE" in the last 168 hours. No results for input(s): "AMMONIA" in the last  168 hours.  Coagulation Profile: Recent Labs  Lab 07/06/23 1422  INR 0.9    Cardiac Enzymes: No results for input(s): "CKTOTAL", "CKMB", "CKMBINDEX", "TROPONINI" in the last 168 hours.  BNP (last 3 results) No results for input(s): "PROBNP" in the last 8760 hours.  Lipid Profile: No results for input(s): "CHOL", "HDL", "LDLCALC", "TRIG", "CHOLHDL", "LDLDIRECT" in the last 72 hours.  Thyroid Function Tests: No results for input(s): "TSH", "T4TOTAL", "FREET4", "T3FREE", "THYROIDAB" in the last 72 hours.  Anemia Panel: No results for input(s): "VITAMINB12", "FOLATE", "FERRITIN", "TIBC", "IRON", "RETICCTPCT" in the last 72 hours.  Urine analysis:    Component Value Date/Time   COLORURINE STRAW (A) 06/10/2023 0742   APPEARANCEUR CLEAR 06/10/2023 0742   LABSPEC 1.033 (H) 06/10/2023 0742   PHURINE 7.0 06/10/2023 0742   GLUCOSEU 50 (A) 06/10/2023 0742   HGBUR NEGATIVE 06/10/2023 0742   HGBUR negative 08/04/2010 0943   BILIRUBINUR NEGATIVE 06/10/2023 0742    KETONESUR NEGATIVE 06/10/2023 0742   PROTEINUR NEGATIVE 06/10/2023 0742   UROBILINOGEN 0.2 01/01/2012 1259   NITRITE NEGATIVE 06/10/2023 0742   LEUKOCYTESUR NEGATIVE 06/10/2023 0742    Sepsis Labs: Lactic Acid, Venous No results found for: "LATICACIDVEN"  MICROBIOLOGY: Recent Results (from the past 240 hour(s))  Resp panel by RT-PCR (RSV, Flu A&B, Covid) Anterior Nasal Swab     Status: None   Collection Time: 07/06/23  4:52 PM   Specimen: Anterior Nasal Swab  Result Value Ref Range Status   SARS Coronavirus 2 by RT PCR NEGATIVE NEGATIVE Final   Influenza A by PCR NEGATIVE NEGATIVE Final   Influenza B by PCR NEGATIVE NEGATIVE Final    Comment: (NOTE) The Xpert Xpress SARS-CoV-2/FLU/RSV plus assay is intended as an aid in the diagnosis of influenza from Nasopharyngeal swab specimens and should not be used as a sole basis for treatment. Nasal washings and aspirates are unacceptable for Xpert Xpress SARS-CoV-2/FLU/RSV testing.  Fact Sheet for Patients: BloggerCourse.com  Fact Sheet for Healthcare Providers: SeriousBroker.it  This test is not yet approved or cleared by the [place] FDA and has been authorized for detection and/or diagnosis of SARS-CoV-2 by FDA under an Emergency Use Authorization (EUA). This EUA will remain in effect (meaning this test can be used) for the duration of the COVID-19 declaration under Section 564(b)(1) of the Act, 21 U.S.C. section 360bbb-3(b)(1), unless the authorization is terminated or revoked.     Resp Syncytial Virus by PCR NEGATIVE NEGATIVE Final    Comment: (NOTE) Fact Sheet for Patients: BloggerCourse.com  Fact Sheet for Healthcare Providers: SeriousBroker.it  This test is not yet approved or cleared by the [place] FDA and has been authorized for detection and/or diagnosis of SARS-CoV-2 by FDA under an Emergency Use  Authorization (EUA). This EUA will remain in effect (meaning this test can be used) for the duration of the COVID-19 declaration under Section 564(b)(1) of the Act, 21 U.S.C. section 360bbb-3(b)(1), unless the authorization is terminated or revoked.  Performed at [facility], [address], [place], [place] [address]     RADIOLOGY STUDIES/RESULTS: DG Chest Port 1 View  Result Date: 07/08/2023 CLINICAL DATA:  Shortness of breath. EXAM: PORTABLE CHEST 1 VIEW COMPARISON:  July 06, 2023. FINDINGS: Stable cardiomegaly. Diffuse interstitial density is again noted which may represent chronic interstitial lung disease, but acute superimposed edema or atypical inflammation cannot be excluded. Bony thorax is unremarkable. IMPRESSION: Bilateral diffuse interstitial densities as described above. Electronically Signed   By: Zenda Alpers.D.  On: 07/08/2023 08:41   EEG adult  Result Date: 07/07/2023 Charlsie Quest, MD     07/07/2023  8:08 AM Patient Name: Charleen Salsberry MRN: [id number] Epilepsy Attending: Charlsie Quest Referring Physician/Provider: Milon Dikes, MD Date: 07/06/2023 Duration: 20.21 mins Patient history: 89 year old woman past history of dementia, recent subarachnoid hemorrhage secondary to a ruptured left MCA aneurysm status post coil embolization and multiple other aneurysms, developed encephalopathy and agitation while in the hospital discharged from rehab on 07/05/2023 brought back after family noticed sudden onset of right sided weakness. EEG to evaluate for seizure Level of alertness: Asleep AEDs during EEG study: TPM Technical aspects: This EEG study was done with scalp electrodes positioned according to the 10-20 International system of electrode placement. Electrical activity was reviewed with band pass filter of 1-70Hz , sensitivity of 7 uV/mm, display speed of 68mm/sec with a 60Hz  notched filter applied as appropriate. EEG data were recorded continuously and digitally  stored.  Video monitoring was available and reviewed as appropriate. Description: Sleep was characterized by vertex waves, sleep spindles (12 to 14 Hz), maximal frontocentral region. Hyperventilation and photic stimulation were not performed.   IMPRESSION: This study during sleep only is within normal limits. No seizures or epileptiform discharges were seen throughout the recording. A normal interictal EEG does not exclude nor support the diagnosis of epilepsy. Charlsie Quest   DG Chest Port 1 View  Result Date: 07/06/2023 CLINICAL DATA:  Altered level of consciousness, facial droop EXAM: PORTABLE CHEST 1 VIEW COMPARISON:  06/18/2023 FINDINGS: Single frontal view of the chest demonstrates an enlarged cardiac silhouette. Lung volumes are diminished, with crowding the central vasculature. Chronic interstitial scarring, without acute airspace disease, effusion, or pneumothorax. No acute bony abnormalities. IMPRESSION: 1. Low lung volumes.  No acute process. Electronically Signed   By: Sharlet Salina M.D.   On: 07/06/2023 18:52   MR BRAIN WO CONTRAST  Result Date: 07/06/2023 CLINICAL DATA:  Headache.  Neuro deficit. EXAM: MRI HEAD WITHOUT CONTRAST TECHNIQUE: Multiplanar, multiecho pulse sequences of the brain and surrounding structures were obtained without intravenous contrast. COMPARISON:  CT head without contrast 07/03/2023 and 07/06/2023. FINDINGS: Brain: The axial diffusion-weighted images are distorted by nasal jewelry. Coronal images demonstrate acute/subacute infarcts of the left frontal operculum and basal ganglia. T1 weighted images demonstrate some shortening in the left frontal operculum consistent with cortical laminar necrosis. This is consistent with both acute and subacute infarcts. Remote subarachnoid blood products are present. No new hemorrhage is present. The left MCA coil mass is noted. No significant mass effect or midline shift is present. The ventricles are of normal size. The brainstem  and cerebellum are within normal limits. The internal auditory canals are within normal limits. Enlarged empty sella is present. Midline structures are otherwise unremarkable. Vascular: Flow is present in the major intracranial arteries. 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 Skull and upper cervical spine: The craniocervical junction is normal. Upper cervical spine is within normal limits. Marrow signal is unremarkable. Sinuses/Orbits: The paranasal sinuses and mastoid air cells are clear. The globes and orbits are within normal limits. IMPRESSION: 1. 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 2. Remote subarachnoid blood products are present. No new hemorrhage is present. 3. 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 4. Enlarged empty sella. This is nonspecific, but can be seen in the setting of idiopathic intracranial hypertension. 5. The axial diffusion-weighted images are distorted by nasal jewelry. 6. Coronal images demonstrate acute/subacute infarcts of the left frontal operculum  and basal ganglia. Electronically Signed   By: Marin Roberts M.D.   On: 07/06/2023 17:28   CT ANGIO HEAD NECK W WO CM (CODE STROKE)  Result Date: 07/06/2023 CLINICAL DATA:  Neuro deficit, acute, stroke suspected. Recent subarachnoid hemorrhage. Confusion. Aphasia. EXAM: CT ANGIOGRAPHY HEAD AND NECK WITH AND WITHOUT CONTRAST TECHNIQUE: Multidetector CT imaging of the head and neck was performed using the standard protocol during bolus administration of intravenous contrast. Multiplanar CT image reconstructions and MIPs were obtained to evaluate the vascular anatomy. Carotid stenosis measurements (when applicable) are obtained utilizing NASCET criteria, using the distal internal carotid diameter as the denominator. RADIATION DOSE REDUCTION: This exam was performed according to the departmental dose-optimization program which includes automated exposure control, adjustment of the mA and/or kV  according to patient size and/or use of iterative reconstruction technique. CONTRAST:  75mL OMNIPAQUE IOHEXOL 350 MG/ML SOLN COMPARISON:  CT head without contrast 07/06/2023. CT angio head and neck 06/10/2023. FINDINGS: CTA NECK FINDINGS Aortic arch: Atherosclerotic calcifications are again noted at the aortic arch and great vessel origins without focal stenosis or aneurysm. Right carotid system: The cervical scratched at the right common carotid artery is within normal limits. Minimal atherosclerotic irregularity is present at the bifurcation without significant stenosis. Mild tortuosity is present in mid cervical right ICA without significant stenosis. Left carotid system: Mild tortuosity is present in the left common carotid artery. Atherosclerotic changes are present bifurcation without significant stenosis. Mild tortuosity is again noted in the cervical left ICA without significant stenosis. Vertebral arteries: The vertebral arteries are codominant. Both vertebral arteries originate from the subclavian arteries. Moderate right-sided stenosis is again noted. No other significant stenosis is present in either vertebral artery in the neck. Skeleton: Vertebral body heights and alignment are normal. No focal osseous lesions are present. The patient is edentulous. Other neck: Overall the study is mildly degraded by patient motion. No significant interval change is present. Heterogeneous thyroid is noted. A dominant 18 mm nodule is again noted within the left lobe of the thyroid. Upper chest: Patchy airspace opacities are present posteriorly in the lungs bilaterally, right greater than left. No nodular airspace disease present. The heart is enlarged. Review of the MIP images confirms the above findings CTA HEAD FINDINGS Anterior circulation: The internal carotid arteries are within normal limits through the ICA termini. The A1 and M1 segments are normal. A large coil mass is now present at the left MCA bifurcation. A  9 mm right MCA bifurcation aneurysm with a wide neck is again noted. M2 segments originate from the base of the aneurysm. ACA and MCA branch vessels are within normal limits bilaterally. Posterior circulation: The vertebral arteries are codominant. Vertebrobasilar junction basilar artery is normal. The right posterior cerebral artery originates from basilar tip. The left posterior cerebral artery is of fetal type. 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 The PCA branch vessels are normal bilaterally. Venous sinuses: The dural sinuses are patent. The straight sinus and deep cerebral veins are intact. Cortical veins are within normal limits. No significant vascular malformation is evident. Anatomic variants: Fetal type left posterior cerebral artery. Review of the MIP images confirms the above findings IMPRESSION: 1. Large coil mass now present at the left MCA bifurcation. 2. Stable 9 mm right MCA bifurcation aneurysm with a wide neck. M2 segments originate from the base of the aneurysm. 3. 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 4. 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 5. Stable  mild atherosclerotic changes at the carotid bifurcations without significant stenosis. 6. Patchy airspace opacities posteriorly in the lungs bilaterally, right greater than left. This may represent atelectasis or infection. 7.  Aortic Atherosclerosis (ICD10-I70.0). The above was relayed via text pager to Dr. Milon Dikes on 07/06/2023 at 15:00 . Electronically Signed   By: Marin Roberts M.D.   On: 07/06/2023 15:00   CT HEAD CODE STROKE WO CONTRAST  Result Date: 07/06/2023 CLINICAL DATA:  Code stroke. Neuro deficit, acute, stroke suspected. Recent subarachnoid hemorrhage treated with endovascular obliteration. EXAM: CT HEAD WITHOUT CONTRAST TECHNIQUE: Contiguous axial images were obtained from the base of the skull through the vertex without intravenous  contrast. RADIATION DOSE REDUCTION: This exam was performed according to the departmental dose-optimization program which includes automated exposure control, adjustment of the mA and/or kV according to patient size and/or use of iterative reconstruction technique. COMPARISON:  CT head without contrast 07/03/2023 FINDINGS: Brain: Accounting for streak artifact in the left MCA territory, no acute infarct, hemorrhage or mass lesion is present. Mild atrophy and white matter changes are likely within normal limits for age. Deep brain nuclei are within normal limits. The ventricles are of normal size. No significant extraaxial fluid collection is present. Vascular: Coil mass is present in the left MCA bifurcation. No significant vascular calcifications are present. No hyperdense vessel is present. Skull: No significant extracranial soft tissue lesion is present. Calvarium is intact. No focal lytic or blastic lesions are present. Sinuses/Orbits: The paranasal sinuses and mastoid air cells are clear. The globes and orbits are within normal limits. ASPECTS [facility] Stroke Program Early CT Score) - Ganglionic level infarction (caudate, lentiform nuclei, internal capsule, insula, M1-M3 cortex): 7/7 - Supraganglionic infarction (M4-M6 cortex): 3/3 Total score (0-10 with 10 being normal): 10/10 IMPRESSION: 1. No acute intracranial abnormality. 2. Coil mass in the left MCA bifurcation. 3. Mild atrophy and white matter changes are likely within normal limits for age. 4. Aspects is 10/10. The above was relayed via text pager to Dr. Wilford Corner on 07/06/2023 at 14:33 . Electronically Signed   By: Marin Roberts M.D.   On: 07/06/2023 14:34     LOS: 2 days   Jeoffrey Massed, MD  [facility]    To contact the attending provider between 7A-7P or the covering provider during after hours 7P-7A, please log into the web site [email or web address] and access using universal  AFB password for that web site. If you do not have  the password, please call the hospital operator.  07/08/2023, 10:17 AM

## 2023-07-08 NOTE — Progress Notes (Addendum)
STROKE TEAM PROGRESS NOTE   BRIEF HPI Ms. Lowanda Gilner is a 89 y.o. 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  On the morning after her discharge from rehab on 9/13, patient complained of headache and was found to have aphasia, right sided weakness, and drooling.  CT head showed coil mass in left MCA bifurcation, confirmed with CTA head/neck.  MRI brain: Acute/subacute infarct involving left frontal operculum and basal ganglia.  EEG negative.  Likeliest etiology: Acute left MCA infarct versus delayed vasospasm from recent subarachnoid hemorrhage.   SIGNIFICANT HOSPITAL EVENTS 9/14: Admission, transfer to floor, EEG negative palliative care conversation with family 9/15: Patient made DNR/DNI, family declined hospice/comfort care measures, would like to wait a few days to see if she recovers on IV fluids   INTERIM HISTORY/SUBJECTIVE  On interview with family member in room, patient appears in some distress.  When asked if she can indicate where the headache is, she gestures to her right forehead.  Follows some commands, midline and left sided, which marks an improvement from yesterday.  Family says that she has been swallowing small sips of water.  Per SLP note, patient should remain NPO.  Patient status appears improved, details below.  Family member in room seemed more open to NG tube than previously.  Asked about PEG tube, informed patient that we would only consider this if it is within the patient's goals and after other options (NG tube) have been exhausted.   OBJECTIVE  CBC    Component Value Date/Time   WBC 6.1 07/08/2023 0346   RBC 4.34 07/08/2023 0346   HGB 12.2 07/08/2023 0346   HCT 38.6 07/08/2023 0346   PLT 184 07/08/2023 0346   MCV 88.9 07/08/2023 0346   MCH 28.1 07/08/2023 0346   MCHC 31.6 07/08/2023 0346   RDW  15.5 07/08/2023 0346   LYMPHSABS 2.1 07/06/2023 1422   MONOABS 0.5 07/06/2023 1422   EOSABS 0.4 07/06/2023 1422   BASOSABS 0.1 07/06/2023 1422    BMET    Component Value Date/Time   NA 138 07/08/2023 0346   K 3.4 (L) 07/08/2023 0346   CL 109 07/08/2023 0346   CO2 21 (L) 07/08/2023 0346   GLUCOSE 95 07/08/2023 0346   BUN 13 07/08/2023 0346   CREATININE 0.73 07/08/2023 0346   CALCIUM 8.8 (L) 07/08/2023 0346   GFRNONAA >60 07/08/2023 0346    IMAGING past 24 hours DG Chest Port 1 View  Result Date: 07/08/2023 CLINICAL DATA:  Shortness of breath. EXAM: PORTABLE CHEST 1 VIEW COMPARISON:  July 06, 2023. FINDINGS: Stable cardiomegaly. Diffuse interstitial density is again noted which may represent chronic interstitial lung disease, but acute superimposed edema or atypical inflammation cannot be excluded. Bony thorax is unremarkable. IMPRESSION: Bilateral diffuse interstitial densities as described above. Electronically Signed   By: Lupita Raider M.D.   On: 07/08/2023 08:41    Vitals:   07/07/23 2210 07/08/23 0044 07/08/23 0400 07/08/23 0815  BP:  (!) 154/87 (!) 155/139 133/70  Pulse:  98 (!) 101 91  Resp:  (!) 24 18   Temp: 98.2 F (36.8 C) (!) 96.9 F (36.1 C) 98.1 F (36.7 C)   TempSrc:  Axillary Axillary   SpO2:  99% 98%   Weight:      Height:         PHYSICAL EXAM General: Somewhat attentive to interview, poor  head with her left hand, appears in some distress Psych: Unable to assess CV: Regular rate and rhythm on monitor Respiratory:  Regular, unlabored respirations on room air GI: Abdomen soft and nontender   NEURO:  Mental Status: Unable to assess Speech/Language: Patient has full expressive and some receptive aphasia, can follow some midline and left-sided commands, improved from yesterday  Cranial Nerves:  II: Unable to assess III, IV, VI: EOMI. Eyelids elevate symmetrically.  V: Sensation intact VII: Some right facial droop VIII: hearing intact to  voice. IX, X: Unable to assess XI: unable to assess XII: tongue is midline without fasciculations. Motor: Right lower extremity: 3- out of 5, right upper extremity: 2- out of 5.  Left upper and lower limb: 4 out of 5. Tone: is normal and bulk is normal Sensation: Intact on face, upper limbs, and lower limbs Coordination:Unable to assess Gait: deferred   ASSESSMENT/PLAN  Stroke: left MCA infarct with recent left MCA aneurysm coiling, etiology unclear, ? stent thrombosis vs ? vasospasm Code Stroke CT head: No acute abnormality. ASPECTS 10.    CTA head & neck: Large coil mass now present and left MCA bifurcation, stable 9 mm right MCA bifurcation aneurysm with white neck, stable 4 mm right PCA aneurysm, stable moderate stenosis of right vertebral artery MRI: Acute/subacute infarct involving left frontal operculum and basal gangli Family not interested further endovascular procedure. Dr. Jerral Ralph discussed with Dr. Conchita Paris, no angiogram will be offered at this time Transcranial Doppler cancelled per above reason EEG no seizure VTE prophylaxis -subcu Lovenox 40 mg No antithrombotic prior to admission, now on ASA 300 PR, can transition to ASA 81 once po access Therapy recommendations - SNF Disposition: TBD   Cerebral aneurysms 06/10/2023 admitted for headache, right facial droop and right-sided weakness.  CT showed diffuse SAH.  CTA head and neck showed left MCA bifurcation large 19mm aneurysm, right MCA 9 mm and right P-comm 5 mm aneurysm.  Status post coil embolization same day.  Postop, patient had encephalopathy, delirium, agitation, was intubated.  TCD no vasospasm.  CT repeat stable SAH.  LDL 132, A1c 5.8.  2D echo EF 70 to 75%. Patient was later extubated and discharged to CIR. During CIR stay, patient continued to complain of headache, Topamax added and increased to 100 mg nightly.  Fioricet as needed.  Bilateral lower extremity Doppler showed no DVT.  Amlodipine completed 9/9.   Worsening headache 9/10, CT 9/11 shows stable awaiting SAH.  Patient discharged on 9/13.  Hypertension Home meds: Bisoprolol, held Stable now BP goal less than 160 Long-term BP goal normotensive  Hyperlipidemia Home meds: None LDL 132, goal < 70 Consider crestor 20 once po access Continue statin on discharge  Dysphagia Patient has post-stroke dysphagia SLP on board N.p.o. for now, pending MBS On IVF May consider cortrak if no po access, family seems OK with cortrak if needed  Other Stroke Risk Factors Advanced age  Other acute issues Dementia  Hospital day # 2  Luiz Iron, MD Psychiatry Resident, PGY-1  ATTENDING NOTE: I reviewed above note and agree with the assessment and plan. Pt was seen and examined.   Son at the bedside. Pt lying in bed, with left hand holding forehead, seems still have some HA, but reported getting better. Pt seems more awake alert today and able to follow most simple commands but not all of them. She still nonverbal, not able to name or repeat. Moving LUE and LLE spontaneously and against gravity. RUE 2-/5 and RLE 3-/5.  Still has right facial droop and not consistently blinking to visual threat on the right.   Pt was able to swallow with family but failed with speech therapist. Currently NPO with pending MBS. Family seems OK with cortrak today if needed. On ASA PR. Recommend ASA 81 and statin once po access. Family not interested in further procedure, Dr. Jerral Ralph discussed with Dr. Conchita Paris NSG, no further intervention will be offered. Will cancel TCD. PT and OT recommend SNF.   For detailed assessment and plan, please refer to above/below as I have made changes wherever appropriate.   Neurology will sign off. Please call with questions. Pt will follow up with stroke clinic NP at [facility] in about 4 weeks. Thanks for the consult.   Marvel Plan, MD PhD Stroke Neurology 07/08/2023 1:19 PM    To contact Stroke Continuity provider, please refer to  [email or web address]. After hours, contact General Neurology

## 2023-07-08 NOTE — Progress Notes (Signed)
Patient off unit at this time for barium swallow study.

## 2023-07-09 DIAGNOSIS — I63512 Cerebral infarction due to unspecified occlusion or stenosis of left middle cerebral artery: Secondary | ICD-10-CM | POA: Diagnosis not present

## 2023-07-09 DIAGNOSIS — Z8679 Personal history of other diseases of the circulatory system: Secondary | ICD-10-CM | POA: Diagnosis not present

## 2023-07-09 LAB — CBC
HCT: 35.7 % — ABNORMAL LOW (ref 36.0–46.0)
Hemoglobin: 11.6 g/dL — ABNORMAL LOW (ref 12.0–15.0)
MCH: 29.3 pg (ref 26.0–34.0)
MCHC: 32.5 g/dL (ref 30.0–36.0)
MCV: 90.2 fL (ref 80.0–100.0)
Platelets: 158 10*3/uL (ref 150–400)
RBC: 3.96 MIL/uL (ref 3.87–5.11)
RDW: 15.1 % (ref 11.5–15.5)
WBC: 6.6 10*3/uL (ref 4.0–10.5)
nRBC: 0 % (ref 0.0–0.2)

## 2023-07-09 LAB — BASIC METABOLIC PANEL
Anion gap: 9 (ref 5–15)
BUN: 9 mg/dL (ref 8–23)
CO2: 19 mmol/L — ABNORMAL LOW (ref 22–32)
Calcium: 8.4 mg/dL — ABNORMAL LOW (ref 8.9–10.3)
Chloride: 108 mmol/L (ref 98–111)
Creatinine, Ser: 0.61 mg/dL (ref 0.44–1.00)
GFR, Estimated: >60 mL/min (ref >60)
Glucose, Bld: 97 mg/dL (ref 70–99)
Potassium: 3.3 mmol/L — ABNORMAL LOW (ref 3.5–5.1)
Sodium: 136 mmol/L (ref 135–145)

## 2023-07-09 LAB — MAGNESIUM: Magnesium: 2 mg/dL (ref 1.7–2.4)

## 2023-07-09 MED ORDER — POTASSIUM CHLORIDE 20 MEQ PO PACK
40.0000 meq | PACK | Freq: Once | ORAL | Status: AC
  Administered 2023-07-09: 40 meq via ORAL
  Filled 2023-07-09: qty 2

## 2023-07-09 NOTE — Evaluation (Signed)
Speech Language Pathology Evaluation Patient Details Name: Yolanda [name] MRN: [id number] DOB: December 18, 1932 Today's Date: 07/09/2023 Time: 4098-1191 SLP Time Calculation (min) (ACUTE ONLY): 23 min  Problem List:  Patient Active Problem List   Diagnosis Date Noted   Acute encephalopathy 07/06/2023   CVA (cerebral vascular accident) (HCC) 07/06/2023   Essential hypertension 07/06/2023   History of subarachnoid hemorrhage 07/06/2023   Status post coil embolization of cerebral aneurysm 07/06/2023   Hyperkalemia 07/06/2023   Dysphagia 07/06/2023   Chronic diastolic CHF (congestive heart failure) (HCC) 07/06/2023   Acute respiratory failure with hypoxia (HCC) 06/11/2023   Subarachnoid hemorrhage (HCC) 06/10/2023   Dementia (HCC) 06/29/2022   Closed fracture of left proximal humerus 03/27/2021   Closed fracture of lower end of left radius with routine healing 10/31/2020   Dry cough 04/04/2019   Mild intermittent asthma without complication 04/04/2019   OSTEOARTHRITIS 08/04/2010   Past Medical History:  Past Medical History:  Diagnosis Date   Closed fracture of left proximal humerus 03/27/2021   Closed fracture of lower end of left radius with routine healing 10/31/2020   Dementia (HCC)    Mild intermittent asthma without complication 04/04/2019   OSTEOARTHRITIS 08/04/2010   Annotation: Bilateral knees and ankles Qualifier: Diagnosis of  By: Delrae Alfred MD, Lanora Manis     Past Surgical History:  Past Surgical History:  Procedure Laterality Date   IR 3D INDEPENDENT WKST  06/10/2023   IR ANGIO INTRA EXTRACRAN SEL INTERNAL CAROTID UNI L MOD SED  06/11/2023   IR ANGIOGRAM FOLLOW UP STUDY  06/10/2023   IR ANGIOGRAM FOLLOW UP STUDY  06/10/2023   IR NEURO EACH ADD'L AFTER BASIC UNI LEFT (MS)  06/10/2023   IR TRANSCATH/EMBOLIZ  06/10/2023   RADIOLOGY WITH ANESTHESIA N/A 06/10/2023   Procedure: IR WITH ANESTHESIA;  Surgeon: Lisbeth Renshaw, MD;  [facility];  Service: Radiology;  Laterality: N/A;    HPI:  [name] is an 89 yo female presenting to ED 9/14 with R sided weakness and speech difficulty. 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 Recently discharged from [facility] 9/13. Most recently seen by SLP 9/12 tolerating Dys 3 diet with thin liquids. Cognitive goals targeting sustained attention, orientation, and problem solving. Pt noted ot be close to baseline with goals downgraded when d/c from AIR. PMH includes dementia, recent SAH secondary to L MCA aneurysmal rupture s/p coil embolization, multiple other aneurysms, developed encephalopathy and agitated delirium   Assessment / Plan / Recommendation Clinical Impression  Teleinterpreter (#[id number]) utilized to facilitate communication during session this date. Pt's daughter-in-law reports that pt appeared at her cognitive baseline upon d/c from [facility], although note significant difficutly with sustained attention, orientation, memory, problem solving, and executive functioning.  Pt required Max visual cueing to follow one-step commands with 50% accuracy. She presents with significant deficits related to sustained attention, which affect her ability to participate in both simple and complex cognitive tasks. Pt presents with both expressive and receptive deficits that are quite severe in nature. Suspect she is slightly more responsive with family or known individuals, although per daughter-in-law, pt has only answered yes/no questions with family by nodding her head and they have not noted any verbal output during this admission. Per prior SLP notes, pt's son provides full assist with all cognitive ADLs at baseline; however, suspect pt is experiencing new onset cognitive decline. Will continue to follow.    SLP Assessment  SLP Recommendation/Assessment: Patient needs continued Speech Lanaguage Pathology Services SLP Visit Diagnosis:  Aphasia (R47.01);Cognitive communication deficit (R41.841)    Recommendations for  follow up therapy are one component of a multi-disciplinary discharge planning process, led by the attending physician.  Recommendations may be updated based on patient status, additional functional criteria and insurance authorization.    Follow Up Recommendations  Skilled nursing-short term rehab (<3 hours/day)    Assistance Recommended at Discharge  Frequent or constant Supervision/Assistance  Functional Status Assessment Patient has had a recent decline in their functional status and demonstrates the ability to make significant improvements in function in a reasonable and predictable amount of time.  Frequency and Duration min 2x/week  2 weeks      SLP Evaluation Cognition  Overall Cognitive Status: Impaired/Different from baseline Arousal/Alertness: Lethargic Orientation Level: Disoriented X4 Attention: Sustained Sustained Attention: Impaired Sustained Attention Impairment: Verbal basic;Functional basic Awareness: Impaired Awareness Impairment: Intellectual impairment Problem Solving: Impaired Problem Solving Impairment: Verbal basic;Functional basic Executive Function: Initiating Initiating: Impaired Initiating Impairment: Verbal basic;Functional basic       Comprehension  Auditory Comprehension Overall Auditory Comprehension: Impaired Yes/No Questions: Impaired Basic Biographical Questions: 0-25% accurate Commands: Impaired One Step Basic Commands: 50-74% accurate Two Step Basic Commands: 0-24% accurate    Expression Expression Primary Mode of Expression: Verbal Verbal Expression Overall Verbal Expression: Impaired Repetition: Impaired Level of Impairment: Word level   Oral / Motor  Oral Motor/Sensory Function Overall Oral Motor/Sensory Function: Mild impairment Facial ROM: Reduced right;Suspected CN VII (facial) dysfunction Facial Symmetry: Abnormal symmetry right;Suspected CN VII (facial) dysfunction Facial Strength: Reduced right;Suspected CN VII (facial)  dysfunction Facial Sensation: Reduced right;Suspected CN V (Trigeminal) dysfunction Lingual ROM: Within Functional Limits Lingual Symmetry: Within Functional Limits Lingual Strength: Within Functional Limits Lingual Sensation: Within Functional Limits Motor Speech Overall Motor Speech: Appears within functional limits for tasks assessed            Gwynneth Aliment, M.A., CF-SLP Speech Language Pathology, Acute Rehabilitation Services  Secure Chat preferred [phone]  07/09/2023, 12:10 PM

## 2023-07-09 NOTE — TOC Progression Note (Addendum)
Transition of Care [facility] - Progression Note    Patient Details  Name: Yolanda [name] MRN: [id number] Date of Birth: 09-14-1933  Transition of [facility]) CM/SW Contact  Gordy Clement, RN Phone Number: 07/09/2023, 12:16 PM  Clinical Narrative:     UPDATE  1:44 PM per CSW- Family wants HH and Hospital bed. [facility] will provide HH/ [facility] to deliver bed to home by 1:00 on 9/18  [facility] to transport at DC   [facility] met with Patient and Family bedside.  One Son was also on the phone on speaker phone to be able to listen. RNCM explained what Home Health was and what services could be offered. RNCM also reiterated that the services were intermittent and for a brief duration vs round the clock.  CSW came into room and explained what  SNF placement entails.   RNCM spoke with Son, Gibson Ramp regarding his  request for assistance with FMLA paperwork.  RNCM successsfully faxed his papers per Dr Jerral Ralph to fax # [phone] for completion. Cover sheet indicated where completed papers are to be faxed for Son's employer. HR Coordinator # [phone]  RNCM gave uncompleted faxed papers back to Son for his records.   TOC will continue to follow patient for any additional discharge needs  and family decision on disposition      Expected Discharge Plan: Skilled Nursing Facility Barriers to Discharge: Continued Medical Work up, SNF Pending bed offer  Expected Discharge Plan and Services In-house Referral: Clinical Social Work     Living arrangements for the past 2 months: Single Family Home                                       Social Determinants of Health (SDOH) Interventions SDOH Screenings   Food Insecurity: No Food Insecurity (06/19/2023)  Housing: High Risk (06/19/2023)  Transportation Needs: Patient Unable To Answer (06/19/2023)  Utilities: Patient Unable To Answer (06/19/2023)  Tobacco Use: Unknown (07/06/2023)    Readmission Risk Interventions    06/26/2023   11:55 AM  Readmission  Risk Prevention Plan  Transportation Screening Complete  Home Care Screening Complete  Medication Review (RN CM) Complete

## 2023-07-09 NOTE — Plan of Care (Signed)
  Problem: Spontaneous Subarachnoid Hemorrhage Tissue Perfusion: Goal: Complications of Spontaneous Subarachnoid Hemorrhage will be minimized Outcome: Progressing   Problem: Nutrition: Goal: Risk of aspiration will decrease Outcome: Progressing   Problem: Nutrition: Goal: Dietary intake will improve Outcome: Progressing

## 2023-07-09 NOTE — NC FL2 (Signed)
[place] MEDICAID FL2 LEVEL OF CARE FORM     IDENTIFICATION  Patient Name: Yolanda [name] Birthdate: 08-02-1933 Sex: female Admission Date (Current Location): 07/06/2023  [facility] and [place] Number:  Producer, television/film/video and Address:  [facility] [place], [address], [place], [place] [address]      Provider Number: [id number]  Attending Physician Name and Address:  Maretta Bees, MD  Relative Name and Phone Number:       Current Level of Care: Hospital Recommended Level of Care: Skilled Nursing Facility Prior Approval Number:    Date Approved/Denied:   PASRR Number: [id number]  Discharge Plan: SNF    Current Diagnoses: Patient Active Problem List   Diagnosis Date Noted   Acute encephalopathy 07/06/2023   CVA (cerebral vascular accident) (HCC) 07/06/2023   Essential hypertension 07/06/2023   History of subarachnoid hemorrhage 07/06/2023   Status post coil embolization of cerebral aneurysm 07/06/2023   Hyperkalemia 07/06/2023   Dysphagia 07/06/2023   Chronic diastolic CHF (congestive heart failure) (HCC) 07/06/2023   Acute respiratory failure with hypoxia (HCC) 06/11/2023   Subarachnoid hemorrhage (HCC) 06/10/2023   Dementia (HCC) 06/29/2022   Closed fracture of left proximal humerus 03/27/2021   Closed fracture of lower end of left radius with routine healing 10/31/2020   Dry cough 04/04/2019   Mild intermittent asthma without complication 04/04/2019   OSTEOARTHRITIS 08/04/2010    Orientation RESPIRATION BLADDER Height & Weight      (Disoriented x4)  Normal Incontinent, External catheter Weight: 124 lb 12.5 oz (56.6 kg) Height:  4\' 8"  (142.2 cm)  BEHAVIORAL SYMPTOMS/MOOD NEUROLOGICAL BOWEL NUTRITION STATUS      Incontinent Diet (See dc summary)  AMBULATORY STATUS COMMUNICATION OF NEEDS Skin   Extensive Assist Verbally Normal                       Personal Care Assistance Level of Assistance  Bathing, Feeding, Dressing  Bathing Assistance: Maximum assistance Feeding assistance: Limited assistance Dressing Assistance: Limited assistance     Functional Limitations Info  Sight (Speaks Nepali) Sight Info: Impaired        SPECIAL CARE FACTORS FREQUENCY  PT (By licensed PT), OT (By licensed OT), Speech therapy     PT Frequency: 5x/week OT Frequency: 5x/week     Speech Therapy Frequency: 2x/week      Contractures Contractures Info: Not present    Additional Factors Info  Code Status, Allergies Code Status Info: DNR Allergies Info: Meat Extract           Current Medications (07/09/2023):  This is the current hospital active medication list Current Facility-Administered Medications  Medication Dose Route Frequency Provider Last Rate Last Admin   acetaminophen (TYLENOL) tablet 650 mg  650 mg Oral Q6H PRN Madelyn Flavors A, MD   650 mg at 07/09/23 1051   Or   acetaminophen (TYLENOL) suppository 650 mg  650 mg Rectal Q6H PRN Madelyn Flavors A, MD       albuterol (PROVENTIL) (2.5 MG/3ML) 0.083% nebulizer solution 2.5 mg  2.5 mg Nebulization Q6H PRN Madelyn Flavors A, MD       aspirin chewable tablet 81 mg  81 mg Oral Daily Maretta Bees, MD   81 mg at 07/09/23 0828   enoxaparin (LOVENOX) injection 40 mg  40 mg Subcutaneous Q24H Katrinka Blazing, Rondell A, MD   40 mg at 07/08/23 2120   ramelteon (ROZEREM) tablet 8 mg  8 mg Oral QHS Smith, Rondell A,  MD   8 mg at 07/08/23 2121   rosuvastatin (CRESTOR) tablet 20 mg  20 mg Oral Daily Marvel Plan, MD   20 mg at 07/09/23 0820   sertraline (ZOLOFT) tablet 25 mg  25 mg Oral Daily Smith, Rondell A, MD   25 mg at 07/09/23 0820   sodium chloride flush (NS) 0.9 % injection 3 mL  3 mL Intravenous Q12H Smith, Rondell A, MD   3 mL at 07/09/23 0821   tamsulosin (FLOMAX) capsule 0.4 mg  0.4 mg Oral QPC supper Madelyn Flavors A, MD   0.4 mg at 07/08/23 1808   topiramate (TOPAMAX) tablet 100 mg  100 mg Oral QHS Smith, Rondell A, MD   100 mg at 07/08/23 2120   traZODone  (DESYREL) tablet 100 mg  100 mg Oral QHS Smith, Rondell A, MD   100 mg at 07/08/23 2121   [START ON 07/12/2023] Vitamin D (Ergocalciferol) (DRISDOL) 1.25 MG (50000 UNIT) capsule 50,000 Units  50,000 Units Oral Q7 days Clydie Braun, MD         Discharge Medications: Please see discharge summary for a list of discharge medications.  Relevant Imaging Results:  Relevant Lab Results:   Additional Information SSN: [id number] [place], [place]

## 2023-07-09 NOTE — TOC Initial Note (Addendum)
Transition of Care [facility]) - Initial/Assessment Note    Patient Details  Name: Yolanda [name] MRN: [id number] Date of Birth: 06-Oct-1933  Transition of Care [facility]) CM/SW Contact:    Mearl Latin, LCSW Phone Number: 07/09/2023, 12:12 PM  Clinical Narrative:                 12pm-CSW and RNCM met with patient's daughter in law in the room and son on speaker phone to discuss discharge plan. CSW described SNF versus home health and answered questions. Patient currently receives 4-5 hours of PCS in the home currently and CSW explained how to apply for more hours. Daughter in law will call CSW back with their decision once family meets again today to discuss.   1pm-CSW received call from daughter in law stating they would like to take patient home. RNCM assisting in obtaining hospital bed. Patient has a wheelchair and commode. Family requests PTAR for transport home. CSW confirmed address on facesheet.   4pm-CSW spoke with patient's son Yolanda [name] [phone]) and confirmed that family would like to take patient home with home health services. He is aware that the hospital bed will be delivered.   Skilled Nursing Rehab Facilities-   [email or web address]   Ratings out of 5 stars (5 the highest)   Name Address  Phone # Quality Care Staffing Health Inspection Overall  [facility] [address], [place] [phone] 2 1 5 4   [facility] [address], [place] [phone] 4 1 3 2   [facility] [address], [place] Otto [phone] 2 1 1 1   [facility] [address] [place] [phone] 4 1 3 2   [facility]  [address], [place] [phone] 3 2 5 5   [facility] [address] [facility] [phone] 2 1 2 1   [facility] [address] [place] [phone] 4 1 2 1   [facility] [id number] [place] [phone] 2 4 3 3   [address] ([facility]) [address] [place]  [phone] 3 2 2 2   [facility] [address] [place], [place] [phone] 1 2 1 1   [facility] ([place]) [address], [place] [phone] 3 1 1 1   [facility] [address] [place]iane Shi [phone] 4 3 4 4   [facility] [address] [place] [phone] [facility] [address], [place] [phone]      [facility], [place] [address], [place] [phone] 1 1 2 1   [facility] [address] [employer] [phone] 2 2 4 4   [facility] [address] [phone] 2 1 4 3   [facility] [address], [place] [phone] 3 3 3 3           [address] (no [facility]) [address], [place] [phone] 4 4 5 5   [facility] (No [employer]) [address] Lavera Guise [phone] 2 2 4 4   [facility] [address] [place] [phone] 2 1 2 1   [facility] (No [employer]) [address], [place] [phone] 5 5 5 5   [facility] [address], [facility] [phone] 2 3 5 5   [facility] [address], [place] [phone] 2 1 1 1   [facility] [address] [place] [phone] 5 2 5 5   [facility]  [address] [place] [phone] 2 2 2 2   [facility] [address]  [address], [place] [phone] 4 2 1 1   [facility] [address] [place], [place] [phone] 2 2 3 3           [facility] [address], [place] [phone] 1 1 1 1   [facility] [address] [place]lyn Clines  [phone] 2 3 3 3   [facility] (No Humana) [address], [place] [phone] 2 1 3 2   [facility]) [address], [place]an [phone] 1 1 1 1   [facility] [address], [place] [phone] [facility] [address], [place] [phone] 2 1 1 1           [facility] [address] [place], [place] [phone] 4 4 5 5   [facility])  [address] [place] [phone] 2 1 2 1   [facility]) [address] [place], [place] [phone]  1 4 3   [facility] [address] [place] [phone] 3 5 4 5   [address] [place], [place] [phone] 3 2 2 2   [facility]) [address] [place] [phone] 2 1 3 2      Expected Discharge Plan: Skilled Nursing Facility Barriers to Discharge: Continued Medical Work up, SNF Pending bed offer   Patient Goals and CMS Choice Patient states their goals for this hospitalization and ongoing recovery are:: Rehab CMS Medicare.gov Compare Post Acute Care list provided to:: Patient Represenative (must comment) Choice offered to / list presented to : Adult Children (Son and daughter in Social worker) [place] ownership interest in [facility]provided to:: Adult Children    Expected Discharge Plan and Services In-house Referral: Clinical Social Work     Living arrangements for the past 2 months: Single Family Home                                      Prior Living Arrangements/Services Living arrangements for the past 2 months: Single Family Home Lives with:: Adult Children Patient language and need for interpreter reviewed:: Yes (Speaks Nepali) Do you feel safe going back to the place where you live?: Yes      Need for Family Participation in Patient Care: Yes (Comment) Care giver support system in place?: Yes (comment) Current home services: Homehealth aide Criminal Activity/Legal Involvement Pertinent to Current Situation/Hospitalization: No - Comment as needed  Activities of Daily Living      Permission Sought/Granted Permission sought to share information with : Facility Medical sales representative, Family Supports Permission granted to share information with : Yes, Verbal Permission Granted  Share Information with NAME: Yolanda [name]     Permission granted to share info w Relationship:  Son  Permission granted to share info w Contact Information: [phone]  Emotional Assessment Appearance:: Appears stated age Attitude/Demeanor/Rapport: Unable to Assess Affect (typically observed): Unable to Assess Orientation: :  (Disoriented x4) Alcohol / Substance Use: Not Applicable Psych Involvement: No (comment)  Admission diagnosis:  Delirium [R41.0] CVA (cerebral vascular accident) (HCC) [I63.9] History of subarachnoid hemorrhage [Z86.79] Acute right-sided weakness [R53.1] Acute encephalopathy [G93.40] Patient Active Problem List   Diagnosis Date Noted   Acute encephalopathy 07/06/2023   CVA (cerebral vascular accident) (HCC) 07/06/2023   Essential hypertension 07/06/2023   History of subarachnoid hemorrhage 07/06/2023   Status post coil embolization of cerebral aneurysm 07/06/2023   Hyperkalemia 07/06/2023   Dysphagia 07/06/2023  Chronic diastolic CHF (congestive heart failure) (HCC) 07/06/2023   Acute respiratory failure with hypoxia (HCC) 06/11/2023   Subarachnoid hemorrhage (HCC) 06/10/2023   Dementia (HCC) 06/29/2022   Closed fracture of left proximal humerus 03/27/2021   Closed fracture of lower end of left radius with routine healing 10/31/2020   Dry cough 04/04/2019   Mild intermittent asthma without complication 04/04/2019   OSTEOARTHRITIS 08/04/2010   PCP:  Pcp, No Pharmacy:   [id number] - [place] - [address] [place] [address] Phone: [phone] Fax: [phone]  [id number] [place], [place] - [address] AT [facility] [address] [address] [facility] [address] [address] [facility] [address] [place] [address] Phone: [phone] Fax: [phone]  [id number] [place] - [address] AT [facility] [address] [place] [address] Phone: [phone] Fax: [phone]  [facility] [address] [place] [address] Phone: [phone] Fax: [phone]     Social Determinants of Health (SDOH) Social History: SDOH Screenings   Food Insecurity: No Food Insecurity (06/19/2023)  Housing: High Risk (06/19/2023)  Transportation Needs: Patient Unable To Answer (06/19/2023)  Utilities: Patient Unable To Answer (06/19/2023)  Tobacco Use: Unknown (07/06/2023)   SDOH Interventions: Housing Interventions: Intervention Not Indicated   Readmission Risk Interventions    06/26/2023   11:55 AM  Readmission Risk Prevention Plan  Transportation Screening Complete  Home Care Screening Complete  Medication Review (RN CM) Complete

## 2023-07-09 NOTE — Plan of Care (Signed)
  Problem: Spontaneous Subarachnoid Hemorrhage Tissue Perfusion: Goal: Complications of Spontaneous Subarachnoid Hemorrhage will be minimized Outcome: Progressing   Problem: Health Behavior/Discharge Planning: Goal: Goals will be collaboratively established with patient/family Outcome: Progressing   Problem: Nutrition: Goal: Dietary intake will improve Outcome: Progressing    Patient mute and unable to demonstrate understanding of education.  Problem: Education: Goal: Knowledge of disease or condition will improve Outcome: Not Progressing Goal: Knowledge of secondary prevention will improve (MUST DOCUMENT ALL) Outcome: Not Progressing Goal: Knowledge of patient specific risk factors will improve Yolanda [name] N/A or DELETE if not current risk factor) Outcome: Not Progressing   Problem: Coping: Goal: Will verbalize positive feelings about self Outcome: Not Progressing   Problem: Self-Care: Goal: Verbalization of feelings and concerns over difficulty with self-care will improve Outcome: Not Progressing

## 2023-07-09 NOTE — Progress Notes (Signed)
PROGRESS NOTE        PATIENT DETAILS Name: Yolanda [name] Age: 89 y.o. Sex: female Date of Birth: 1933/08/06 Admit Date: 07/06/2023 Admitting Physician Clydie Braun, MD PCP:Pcp, No  Brief Summary: Patient is a 89 y.o.  female with recent history of SAH-s/p left MCA aneurysm coiling-just discharged home from [facility] on 9/13-presented to the hospital on 9/14 with aphasia/right-sided weakness-she was found to have acute CVA and subsequently admitted to the hospitalist service.  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 9/15>> Spot EEG: No seizures  Significant microbiology data: 9/14>> COVID/influenza/RSV PCR: Negative  Procedures: None  Consults: Neurology Neurosurgery  Subjective: Still aphasic-more movement in RLE and RUE.  Tolerating dysphagia 1 diet-son was feeding at this morning without any major issues.  Objective: Vitals: Blood pressure 135/69, pulse 75, temperature 98.6 F (37 C), temperature source Oral, resp. rate 16, height 4\' 8"  (1.422 m), weight 56.6 kg, SpO2 97%.   Exam: Gen Exam:Alert awake-not in any distress HEENT:atraumatic, normocephalic Chest: B/L clear to auscultation anteriorly CVS:S1S2 regular Abdomen:soft non tender, non distended Extremities:no edema Neurology: Able to move RLE side-to-side-able to briefly hold RUE up.  Remains aphasic Skin: no rash  Pertinent Labs/Radiology:    Latest Ref Rng & Units 07/09/2023    3:02 AM 07/08/2023    3:46 AM 07/07/2023    7:06 AM  CBC  WBC 4.0 - 10.5 K/uL 6.6  6.1  7.6   Hemoglobin 12.0 - 15.0 g/dL 09.8  11.9  14.7    Hematocrit 36.0 - 46.0 % 35.7  38.6  39.6   Platelets 150 - 400 K/uL 158  184  215     Lab Results  Component Value Date   NA 136 07/09/2023   K 3.3 (L) 07/09/2023   CL 108 07/09/2023   CO2 19 (L) 07/09/2023      Assessment/Plan: Acute CVA Remains aphasic-improving right-sided hemiparesis Workup as above Neurology recommending aspirin/statin No further interventions recommended by neurosurgery.  Recent SAH-with call embolization of left MCA aneurysm CTA as above Appreciate neurosurgical input-continue to treat as ischemic stroke.  Dysphagia Likely secondary to acute CVA-superimposed on recent SAH Tolerating dysphagia 1 diet  SLP following  Acute metabolic encephalopathy likely combination of CVA-possible aspiration pneumonia Much awake and alert today compared to yesterday  Possible aspiration pneumonia/atelectasis Patchy airspace opacities noted on CTA-likely reflecting aspiration-patient asymptomatic-hold off on starting antibiotics at this point. SLP evaluation ongoing  Chronic HFpEF Euvolemic  Hypokalemia Replete/recheck  HTN Permissive hypertension Bisoprolol on hold  Mild intermittent asthma Bronchodilators  Migraine headaches As needed Tylenol for now Resume Topamax when able  History of urinary retention Flomax was started-during recent hospitalization Resume when oral intake permits.  Palliative care/goals of care Multiple questions with family yesterday and again this morning DNR Allow clinical outcomes for further goals of care discussion-as she seems much better compared to yesterday.   Family discussion ongoing regarding either home health versus SNF.  Do not feel hospice appropriate at this point.  BMI: Estimated body mass index is 27.98 kg/m as calculated  PROGRESS NOTE        PATIENT DETAILS Name: Yolanda [name] Age: 89 y.o. Sex: female Date of Birth: 1933/08/06 Admit Date: 07/06/2023 Admitting Physician Clydie Braun, MD PCP:Pcp, No  Brief Summary: Patient is a 89 y.o.  female with recent history of SAH-s/p left MCA aneurysm coiling-just discharged home from [facility] on 9/13-presented to the hospital on 9/14 with aphasia/right-sided weakness-she was found to have acute CVA and subsequently admitted to the hospitalist service.  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 9/15>> Spot EEG: No seizures  Significant microbiology data: 9/14>> COVID/influenza/RSV PCR: Negative  Procedures: None  Consults: Neurology Neurosurgery  Subjective: Still aphasic-more movement in RLE and RUE.  Tolerating dysphagia 1 diet-son was feeding at this morning without any major issues.  Objective: Vitals: Blood pressure 135/69, pulse 75, temperature 98.6 F (37 C), temperature source Oral, resp. rate 16, height 4\' 8"  (1.422 m), weight 56.6 kg, SpO2 97%.   Exam: Gen Exam:Alert awake-not in any distress HEENT:atraumatic, normocephalic Chest: B/L clear to auscultation anteriorly CVS:S1S2 regular Abdomen:soft non tender, non distended Extremities:no edema Neurology: Able to move RLE side-to-side-able to briefly hold RUE up.  Remains aphasic Skin: no rash  Pertinent Labs/Radiology:    Latest Ref Rng & Units 07/09/2023    3:02 AM 07/08/2023    3:46 AM 07/07/2023    7:06 AM  CBC  WBC 4.0 - 10.5 K/uL 6.6  6.1  7.6   Hemoglobin 12.0 - 15.0 g/dL 09.8  11.9  14.7    Hematocrit 36.0 - 46.0 % 35.7  38.6  39.6   Platelets 150 - 400 K/uL 158  184  215     Lab Results  Component Value Date   NA 136 07/09/2023   K 3.3 (L) 07/09/2023   CL 108 07/09/2023   CO2 19 (L) 07/09/2023      Assessment/Plan: Acute CVA Remains aphasic-improving right-sided hemiparesis Workup as above Neurology recommending aspirin/statin No further interventions recommended by neurosurgery.  Recent SAH-with call embolization of left MCA aneurysm CTA as above Appreciate neurosurgical input-continue to treat as ischemic stroke.  Dysphagia Likely secondary to acute CVA-superimposed on recent SAH Tolerating dysphagia 1 diet  SLP following  Acute metabolic encephalopathy likely combination of CVA-possible aspiration pneumonia Much awake and alert today compared to yesterday  Possible aspiration pneumonia/atelectasis Patchy airspace opacities noted on CTA-likely reflecting aspiration-patient asymptomatic-hold off on starting antibiotics at this point. SLP evaluation ongoing  Chronic HFpEF Euvolemic  Hypokalemia Replete/recheck  HTN Permissive hypertension Bisoprolol on hold  Mild intermittent asthma Bronchodilators  Migraine headaches As needed Tylenol for now Resume Topamax when able  History of urinary retention Flomax was started-during recent hospitalization Resume when oral intake permits.  Palliative care/goals of care Multiple questions with family yesterday and again this morning DNR Allow clinical outcomes for further goals of care discussion-as she seems much better compared to yesterday.   Family discussion ongoing regarding either home health versus SNF.  Do not feel hospice appropriate at this point.  BMI: Estimated body mass index is 27.98 kg/m as calculated  in the last 72 hours.  Urine analysis:    Component Value Date/Time   COLORURINE STRAW (A) 06/10/2023 0742   APPEARANCEUR CLEAR 06/10/2023 0742   LABSPEC 1.033 (H) 06/10/2023 0742   PHURINE 7.0 06/10/2023 0742   GLUCOSEU 50 (A) 06/10/2023 0742   HGBUR NEGATIVE 06/10/2023 0742   HGBUR negative 08/04/2010 0943   BILIRUBINUR NEGATIVE 06/10/2023 0742   KETONESUR NEGATIVE 06/10/2023 0742   PROTEINUR NEGATIVE 06/10/2023 0742   UROBILINOGEN 0.2 01/01/2012 1259   NITRITE NEGATIVE 06/10/2023 0742   LEUKOCYTESUR NEGATIVE 06/10/2023 0742    Sepsis Labs: Lactic Acid, Venous No results found for: "LATICACIDVEN"  MICROBIOLOGY: Recent Results (from the past 240 hour(s))  Resp panel by RT-PCR (RSV, Flu A&B, Covid) Anterior Nasal Swab     Status: None   Collection Time: 07/06/23  4:52 PM   Specimen: Anterior Nasal Swab  Result Value Ref Range Status   SARS Coronavirus 2 by RT PCR NEGATIVE NEGATIVE Final   Influenza A by PCR NEGATIVE NEGATIVE Final   Influenza B by PCR NEGATIVE NEGATIVE Final    Comment: (NOTE) The Xpert  Xpress SARS-CoV-2/FLU/RSV plus assay is intended as an aid in the diagnosis of influenza from Nasopharyngeal swab specimens and should not be used as a sole basis for treatment. Nasal washings and aspirates are unacceptable for Xpert Xpress SARS-CoV-2/FLU/RSV testing.  Fact Sheet for Patients: BloggerCourse.com  Fact Sheet for Healthcare Providers: SeriousBroker.it  This test is not yet approved or cleared by the [place] FDA and has been authorized for detection and/or diagnosis of SARS-CoV-2 by FDA under an Emergency Use Authorization (EUA). This EUA will remain in effect (meaning this test can be used) for the duration of the COVID-19 declaration under Section 564(b)(1) of the Act, 21 U.S.C. section 360bbb-3(b)(1), unless the authorization is terminated or revoked.     Resp Syncytial Virus by PCR NEGATIVE NEGATIVE Final    Comment: (NOTE) Fact Sheet for Patients: BloggerCourse.com  Fact Sheet for Healthcare Providers: SeriousBroker.it  This test is not yet approved or cleared by the [place] FDA and has been authorized for detection and/or diagnosis of SARS-CoV-2 by FDA under an Emergency Use Authorization (EUA). This EUA will remain in effect (meaning this test can be used) for the duration of the COVID-19 declaration under Section 564(b)(1) of the Act, 21 U.S.C. section 360bbb-3(b)(1), unless the authorization is terminated or revoked.  Performed at [facility], [address], [place], [place] [address]     RADIOLOGY STUDIES/RESULTS: DG Swallowing Func-Speech Pathology  Result Date: 07/08/2023 Table formatting from the original result was not included. Modified Barium Swallow Study Patient Details Name: Shawnda Dovel MRN: [id number] Date of Birth: January 22, 1933 Today's Date: 07/08/2023 HPI/PMH: HPI: Leasa Blaze is an 89 yo female presenting to ED 9/14 with R  sided weakness and speech difficulty. 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 Recently discharged from [facility] 9/13. Most recently seen by SLP 9/12 tolerating Dys 3 diet with thin liquids. 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 The study was limtied by pt's excessive movement and difficulty maintaining adequate positioning. Pt consistently initiating the swallow at the level of the pyriform sinuses across all boluses. Trials of thin liquids resulted in misitiming of laryngeal vestibule closure with trace, transient penetration from the pyriform sinuses as the swallow  in the last 72 hours.  Urine analysis:    Component Value Date/Time   COLORURINE STRAW (A) 06/10/2023 0742   APPEARANCEUR CLEAR 06/10/2023 0742   LABSPEC 1.033 (H) 06/10/2023 0742   PHURINE 7.0 06/10/2023 0742   GLUCOSEU 50 (A) 06/10/2023 0742   HGBUR NEGATIVE 06/10/2023 0742   HGBUR negative 08/04/2010 0943   BILIRUBINUR NEGATIVE 06/10/2023 0742   KETONESUR NEGATIVE 06/10/2023 0742   PROTEINUR NEGATIVE 06/10/2023 0742   UROBILINOGEN 0.2 01/01/2012 1259   NITRITE NEGATIVE 06/10/2023 0742   LEUKOCYTESUR NEGATIVE 06/10/2023 0742    Sepsis Labs: Lactic Acid, Venous No results found for: "LATICACIDVEN"  MICROBIOLOGY: Recent Results (from the past 240 hour(s))  Resp panel by RT-PCR (RSV, Flu A&B, Covid) Anterior Nasal Swab     Status: None   Collection Time: 07/06/23  4:52 PM   Specimen: Anterior Nasal Swab  Result Value Ref Range Status   SARS Coronavirus 2 by RT PCR NEGATIVE NEGATIVE Final   Influenza A by PCR NEGATIVE NEGATIVE Final   Influenza B by PCR NEGATIVE NEGATIVE Final    Comment: (NOTE) The Xpert  Xpress SARS-CoV-2/FLU/RSV plus assay is intended as an aid in the diagnosis of influenza from Nasopharyngeal swab specimens and should not be used as a sole basis for treatment. Nasal washings and aspirates are unacceptable for Xpert Xpress SARS-CoV-2/FLU/RSV testing.  Fact Sheet for Patients: BloggerCourse.com  Fact Sheet for Healthcare Providers: SeriousBroker.it  This test is not yet approved or cleared by the [place] FDA and has been authorized for detection and/or diagnosis of SARS-CoV-2 by FDA under an Emergency Use Authorization (EUA). This EUA will remain in effect (meaning this test can be used) for the duration of the COVID-19 declaration under Section 564(b)(1) of the Act, 21 U.S.C. section 360bbb-3(b)(1), unless the authorization is terminated or revoked.     Resp Syncytial Virus by PCR NEGATIVE NEGATIVE Final    Comment: (NOTE) Fact Sheet for Patients: BloggerCourse.com  Fact Sheet for Healthcare Providers: SeriousBroker.it  This test is not yet approved or cleared by the [place] FDA and has been authorized for detection and/or diagnosis of SARS-CoV-2 by FDA under an Emergency Use Authorization (EUA). This EUA will remain in effect (meaning this test can be used) for the duration of the COVID-19 declaration under Section 564(b)(1) of the Act, 21 U.S.C. section 360bbb-3(b)(1), unless the authorization is terminated or revoked.  Performed at [facility], [address], [place], [place] [address]     RADIOLOGY STUDIES/RESULTS: DG Swallowing Func-Speech Pathology  Result Date: 07/08/2023 Table formatting from the original result was not included. Modified Barium Swallow Study Patient Details Name: Shawnda Dovel MRN: [id number] Date of Birth: January 22, 1933 Today's Date: 07/08/2023 HPI/PMH: HPI: Leasa Blaze is an 89 yo female presenting to ED 9/14 with R  sided weakness and speech difficulty. 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 Recently discharged from [facility] 9/13. Most recently seen by SLP 9/12 tolerating Dys 3 diet with thin liquids. 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 The study was limtied by pt's excessive movement and difficulty maintaining adequate positioning. Pt consistently initiating the swallow at the level of the pyriform sinuses across all boluses. Trials of thin liquids resulted in misitiming of laryngeal vestibule closure with trace, transient penetration from the pyriform sinuses as the swallow  PROGRESS NOTE        PATIENT DETAILS Name: Yolanda [name] Age: 89 y.o. Sex: female Date of Birth: 1933/08/06 Admit Date: 07/06/2023 Admitting Physician Clydie Braun, MD PCP:Pcp, No  Brief Summary: Patient is a 89 y.o.  female with recent history of SAH-s/p left MCA aneurysm coiling-just discharged home from [facility] on 9/13-presented to the hospital on 9/14 with aphasia/right-sided weakness-she was found to have acute CVA and subsequently admitted to the hospitalist service.  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 9/15>> Spot EEG: No seizures  Significant microbiology data: 9/14>> COVID/influenza/RSV PCR: Negative  Procedures: None  Consults: Neurology Neurosurgery  Subjective: Still aphasic-more movement in RLE and RUE.  Tolerating dysphagia 1 diet-son was feeding at this morning without any major issues.  Objective: Vitals: Blood pressure 135/69, pulse 75, temperature 98.6 F (37 C), temperature source Oral, resp. rate 16, height 4\' 8"  (1.422 m), weight 56.6 kg, SpO2 97%.   Exam: Gen Exam:Alert awake-not in any distress HEENT:atraumatic, normocephalic Chest: B/L clear to auscultation anteriorly CVS:S1S2 regular Abdomen:soft non tender, non distended Extremities:no edema Neurology: Able to move RLE side-to-side-able to briefly hold RUE up.  Remains aphasic Skin: no rash  Pertinent Labs/Radiology:    Latest Ref Rng & Units 07/09/2023    3:02 AM 07/08/2023    3:46 AM 07/07/2023    7:06 AM  CBC  WBC 4.0 - 10.5 K/uL 6.6  6.1  7.6   Hemoglobin 12.0 - 15.0 g/dL 09.8  11.9  14.7    Hematocrit 36.0 - 46.0 % 35.7  38.6  39.6   Platelets 150 - 400 K/uL 158  184  215     Lab Results  Component Value Date   NA 136 07/09/2023   K 3.3 (L) 07/09/2023   CL 108 07/09/2023   CO2 19 (L) 07/09/2023      Assessment/Plan: Acute CVA Remains aphasic-improving right-sided hemiparesis Workup as above Neurology recommending aspirin/statin No further interventions recommended by neurosurgery.  Recent SAH-with call embolization of left MCA aneurysm CTA as above Appreciate neurosurgical input-continue to treat as ischemic stroke.  Dysphagia Likely secondary to acute CVA-superimposed on recent SAH Tolerating dysphagia 1 diet  SLP following  Acute metabolic encephalopathy likely combination of CVA-possible aspiration pneumonia Much awake and alert today compared to yesterday  Possible aspiration pneumonia/atelectasis Patchy airspace opacities noted on CTA-likely reflecting aspiration-patient asymptomatic-hold off on starting antibiotics at this point. SLP evaluation ongoing  Chronic HFpEF Euvolemic  Hypokalemia Replete/recheck  HTN Permissive hypertension Bisoprolol on hold  Mild intermittent asthma Bronchodilators  Migraine headaches As needed Tylenol for now Resume Topamax when able  History of urinary retention Flomax was started-during recent hospitalization Resume when oral intake permits.  Palliative care/goals of care Multiple questions with family yesterday and again this morning DNR Allow clinical outcomes for further goals of care discussion-as she seems much better compared to yesterday.   Family discussion ongoing regarding either home health versus SNF.  Do not feel hospice appropriate at this point.  BMI: Estimated body mass index is 27.98 kg/m as calculated

## 2023-07-09 NOTE — Progress Notes (Signed)
Physical Therapy Treatment Patient Details Name: Yolanda [name] MRN: [id number] DOB: 1933/02/16 Today's Date: 07/09/2023   History of Present Illness Pt is an 89 y.o. Nepali speaking female who presented 07/06/23 with AMS (drooling out of right side of mouth, not responding, eyes rolling, unable to speak or recognize family. 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 PMH: dementia, asthma, OA, HTN, 06/10/23 subarachnoid hemorrhage more prominent in her left sylvian fissure.  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 S/p coil embolization of L MCA aneurysm.D/C 9/5 to rehab, D/C home 9/13.    PT Comments  Pt received in supine, family present and agreeable to therapy session for instruction on hoyer lift pad placement. Pt needing up to +2 maxA for rolling to L/R sides and mod/maxA for bed chair posture to long sitting trial. After rolling and peri-care assist, BP reassessed and pt noted to have elevated SBP above goal of 160 and MAP (97) and with HOB elevated to 60*, MAP (142) so defer lifting pt OOB to chair for her safety. 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 Plan to work on further education for family on hoyer lift OOB to chair next session if VSS. Pt continues to benefit from PT services to progress toward functional mobility goals.    If plan is discharge home, recommend the following: Two people to help with walking and/or transfers;Two people to help with bathing/dressing/bathroom;Assistance with cooking/housework;Direct supervision/assist for medications management;Direct supervision/assist for financial management;Assistance with feeding;Assist for transportation;Help with stairs or ramp for entrance;Supervision due to cognitive  status   Can travel by private vehicle     No  Equipment Recommendations  Hoyer lift;Hospital bed;Wheelchair cushion (measurements PT)    Recommendations for Other Services       Precautions / Restrictions Precautions Precautions: Fall Precaution Comments: SBP goal <160; R Pusher Syndrome Restrictions Weight Bearing Restrictions: No     Mobility  Bed Mobility Overal bed mobility: Needs Assistance Bed Mobility: Rolling, Supine to Sit Rolling: Max assist, Used rails, +2 for physical assistance Sidelying to sit: +2 for physical assistance, +2 for safety/equipment Supine to sit: Mod assist, HOB elevated, Used rails     General bed mobility comments: 2 person physical assist for rolling to L/R sides, cues for pt to assist as she is able when rolling, pt has more difficulty rolling to her L side due to RUE/LE deficits/neglect. Pt needing modA for bed chair posture to long sitting briefly, defer EOB due to elevated BP and RN notified as it is slightly over goal from neuro. With [facility] elevated SBP drops ~14 points but DBP then elevated so continue to defer EOB/OOB for pt safety. Hoyer lift pad placed for family education then removed when BP found to be elevated to reduce risk of pressure injury since PTA defers OOB to chair at this time with unstable BP.    Transfers Overall transfer level: Needs assistance                 General transfer comment: PTA reviewed hoyer lift wtih verbal/visual demo of pad placement and under pt however defer OOB to chair given pt elevated BP this date. Also pt pushing strongly to her R side with bed in chair posture and OOB to chair may be unsafe if  family need to leave soon.    Ambulation/Gait                   Stairs             Wheelchair Mobility     Tilt Bed    Modified Rankin (Stroke Patients Only) Modified Rankin (Stroke Patients Only) Pre-Morbid Rankin Score: Moderately severe disability Modified Rankin: Severe  disability     Balance Overall balance assessment: Needs assistance Sitting-balance support: Single extremity supported, Feet unsupported Sitting balance-Leahy Scale: Poor Sitting balance - Comments: R pusher syndrome; mod/maxA for long sitting in bed and even with bed in chair posture and support under her R elbow via pillows, pt still pushing to her R side.       Standing balance comment: defer, BP elevated above goal in supine                            Cognition Arousal: Alert Behavior During Therapy: Flat affect Overall Cognitive Status: Difficult to assess (but has history of impairment at baseline) Area of Impairment: Attention, Following commands, Safety/judgement, Awareness, Problem solving                 Orientation Level:  (aphasia) Current Attention Level: Focused   Following Commands: Follows one step commands with increased time, Follows one step commands inconsistently Safety/Judgement: Decreased awareness of safety, Decreased awareness of deficits Awareness: Intellectual Problem Solving: Slow processing, Difficulty sequencing, Requires verbal cues, Requires tactile cues, Decreased initiation General Comments: Slow processing, benefits from multimodal cues; difficult to assess due to language barrier and pt with both expressive and receptive aphasia.        Exercises General Exercises - Lower Extremity Ankle Circles/Pumps: AROM, PROM, Both, 10 reps, Supine Heel Slides: AAROM, Both, 5 reps, Supine (instruction for pt/family on caregiver assisted ROMAT with hand placement to reduce risk of injury) Hip ABduction/ADduction: AAROM, Both, 5 reps, Supine    General Comments General comments (skin integrity, edema, etc.): PTA reviewed supine BLE HEP, defer today due to SBP elevated then when it drops slightly after repositioning, DBP elevated. Also pt/family given handout with pictures/written description on positioning for stroke in bed/chair postures,  pt granddaughter on phone and able to translate from handout for pt and her daughters.      Pertinent Vitals/Pain Pain Assessment Pain Assessment: PAINAD Breathing: normal Negative Vocalization: none Facial Expression: smiling or inexpressive Body Language: relaxed Consolability: no need to console PAINAD Score: 0 Pain Location: Per son she has Bil knee pain at baseline Pain Intervention(s): Monitored during session, Repositioned, Limited activity within patient's tolerance    Home Living                          Prior Function            PT Goals (current goals can now be found in the care plan section) Acute Rehab PT Goals Patient Stated Goal: Unable to state; per Dr. Andres Ege note, family does not desire aggressive care PT Goal Formulation: Patient unable to participate in goal setting Time For Goal Achievement: 07/21/23 Progress towards PT goals: Progressing toward goals (slowly)    Frequency    Min 1X/week      PT Plan      Co-evaluation              AM-PAC PT "6 Clicks" Mobility   Outcome Measure  Help needed turning from your back to your side while in a flat bed without using bedrails?: Total (+2 to roll) Help needed moving from lying on your back to sitting on the side of a flat bed without using bedrails?: Total Help needed moving to and from a bed to a chair (including a wheelchair)?: Total Help needed standing up from a chair using your arms (e.g., wheelchair or bedside chair)?: Total Help needed to walk in hospital room?: Total Help needed climbing 3-5 steps with a railing? : Total 6 Click Score: 6    End of Session Equipment Utilized During Treatment: Other (comment) (bed pad) Activity Tolerance: Patient tolerated treatment well;Treatment limited secondary to medical complications (Comment);Other (comment) (BP elevated; defer OOB for pt safety given SBP elevation) Patient left: with call bell/phone within reach;in bed;with bed alarm  set;with family/visitor present (bed in chair posture, food tray in front of her to work on eating, pillows to promote neutral trunk/seated posture) Nurse Communication: Mobility status;Need for lift equipment;Other (comment) (elevated BP above goal) PT Visit Diagnosis: Muscle weakness (generalized) (M62.81);Difficulty in walking, not elsewhere classified (R26.2);Other symptoms and signs involving the nervous system (R29.898);Unsteadiness on feet (R26.81);Other abnormalities of gait and mobility (R26.89)     Time: 1700-1727 PT Time Calculation (min) (ACUTE ONLY): 27 min  Charges:    $Therapeutic Activity: 23-37 mins PT General Charges $$ ACUTE PT VISIT: 1 Visit                     Deneice Wack P., PTA Acute Rehabilitation Services Secure Chat Preferred 9a-5:30pm Office: [phone]    [facility] 07/09/2023, 6:33 PM

## 2023-07-09 NOTE — Progress Notes (Signed)
Speech Language Pathology Treatment: Dysphagia  Patient Details Name: Yolanda [name] MRN: [id number] DOB: 10/03/1933 Today's Date: 07/09/2023 Time: 1048-1100 SLP Time Calculation (min) (ACUTE ONLY): 12 min  Assessment / Plan / Recommendation Clinical Impression  Teleinterpreter [phone]) utilized during session this date. Pt's daughter-in-law reports pt with some difficulty masticating pureed solids from meal trays. Observed pt with trials of thin liquids via cup with no overt s/s of aspiration. Provided one bite of grits via spoon with oral holding and pt only intermittently following commands to initiate mastication, requiring eventual expectoration of total amount of bolus from her oral cavity. Question if pt's dysphagia is related to distaste for this food and if baseline diet would be more beneficial for adequate PO intake, with which daughter-in-law verbalizes agreement. Recommend downgrading diet to full liquids due to suspected cognitive nature of pt's dysphagia with potential to improve as this improves. Will continue to follow.    HPI HPI: Yolanda [name] is an 89 yo female presenting to ED 9/14 with R sided weakness and speech difficulty. 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 Recently discharged from [facility] 9/13. Most recently seen by SLP 9/12 tolerating Dys 3 diet with thin liquids. PMH includes dementia, recent SAH secondary to L MCA aneurysmal rupture s/p coil embolization, multiple other aneurysms, developed encephalopathy and agitated delirium      SLP Plan  Continue with current plan of care      Recommendations for follow up therapy are one component of a multi-disciplinary discharge planning process, led by the attending physician.  Recommendations may be updated based on patient status, additional functional criteria and insurance authorization.    Recommendations  Diet recommendations: Dysphagia 1 (puree);Thin liquid Liquids provided via:  Cup;Teaspoon Medication Administration: Crushed with puree Supervision: Trained caregiver to feed patient;Intermittent supervision to cue for compensatory strategies                  Oral care QID;Staff/trained caregiver to provide oral care   Frequent or constant Supervision/Assistance Dysphagia, oropharyngeal phase (R13.12)     Continue with current plan of care     Gwynneth Aliment, M.A., CF-SLP Speech Language Pathology, Acute Rehabilitation Services  Secure Chat preferred [phone]   07/09/2023, 11:38 AM

## 2023-07-09 NOTE — TOC CM/SW Note (Addendum)
    Durable Medical Equipment  (From admission, onward)           Start     Ordered   07/09/23 1334  For home use only DME Hospital bed  Once       Comments: [employer]  Question Answer Comment  Length of Need Lifetime   Patient has (list medical condition): intracranial hemmorhage   The above medical condition requires: Patient requires the ability to reposition frequently   Head must be elevated greater than: 30 degrees   Bed type Semi-electric      07/09/23 1339

## 2023-07-10 ENCOUNTER — Other Ambulatory Visit (HOSPITAL_COMMUNITY): Payer: Self-pay

## 2023-07-10 DIAGNOSIS — I10 Essential (primary) hypertension: Secondary | ICD-10-CM | POA: Diagnosis not present

## 2023-07-10 DIAGNOSIS — I6381 Other cerebral infarction due to occlusion or stenosis of small artery: Secondary | ICD-10-CM | POA: Diagnosis not present

## 2023-07-10 DIAGNOSIS — G9340 Encephalopathy, unspecified: Secondary | ICD-10-CM | POA: Diagnosis not present

## 2023-07-10 DIAGNOSIS — R1310 Dysphagia, unspecified: Secondary | ICD-10-CM | POA: Diagnosis not present

## 2023-07-10 MED ORDER — ROSUVASTATIN CALCIUM 20 MG PO TABS: 20.0000 mg | ORAL_TABLET | Freq: Every day | ORAL | 2 refills | Status: AC

## 2023-07-10 MED ORDER — ASPIRIN 81 MG PO CHEW: 81.0000 mg | CHEWABLE_TABLET | Freq: Every day | ORAL | 2 refills | Status: AC

## 2023-07-10 NOTE — Care Management Important Message (Signed)
Important Message  Patient Details  Name: Yolanda [name] MRN: [id number] Date of Birth: 1933-08-28   Medicare Important Message Given:  Yes     Yolanda [name] 07/10/2023, 8:45 AM

## 2023-07-10 NOTE — Progress Notes (Signed)
Discharge instructions (including medications) discussed with and copy provided to patient and her son and daughter.  Patients DNR was returned to her and family is here waiting for [facility] to transport home.  Son stated that the bed and equip has been delivered to the home.

## 2023-07-10 NOTE — TOC Transition Note (Signed)
Transition of Care [facility]) - CM/SW Discharge Note   Patient Details  Name: Yolanda [name] MRN: [id number] Date of Birth: 1933-05-17  [facility] CM/SW Contact:  Gordy Clement, RN Phone Number: 07/10/2023, 11:03 AM   Clinical Narrative:     Patient to DC to home today (SNF declined) Hospital bed just delivered to home  Rollator will be delivered later- bo0th provided by [facility] has been called to transport patient home  Home Health wiill be provided by [facility]. AVS has been updated   No additional TOC needs    Final next level of care: Home w Home Health Services Barriers to Discharge: Continued Medical Work up, SNF Pending bed offer   Patient Goals and CMS Choice CMS Medicare.gov Compare Post Acute Care list provided to:: Patient Represenative (must comment) Choice offered to / list presented to : Adult Children (Son and daughter in Social worker)  Discharge Placement                         Discharge Plan and Services Additional resources added to the After Visit Summary for   In-house Referral: Clinical Social Work                                   Social Determinants of Health (SDOH) Interventions SDOH Screenings   Food Insecurity: No Food Insecurity (07/09/2023)  Housing: Low Risk  (07/09/2023)  Recent Concern: Housing - High Risk (06/19/2023)  Transportation Needs: No Transportation Needs (07/09/2023)  Utilities: Not At Risk (07/09/2023)  Tobacco Use: Unknown (07/06/2023)     Readmission Risk Interventions    06/26/2023   11:55 AM  Readmission Risk Prevention Plan  Transportation Screening Complete  Home Care Screening Complete  Medication Review (RN CM) Complete

## 2023-07-10 NOTE — Discharge Summary (Signed)
no headache,no chest abdominal pain,no new weakness tingling or numbness, feels much better wants to go home today.   Objective:   Blood pressure (!) 158/60, pulse 70, temperature 98.1 F (36.7 C), temperature source Oral, resp. rate 19, height 4\' 8"  (1.422 m), weight 56.6 kg, SpO2 96%.  Intake/Output Summary (Last 24 hours) at 07/10/2023 0857 Last data filed at 07/10/2023 0600 Gross per 24 hour  Intake --  Output 800 ml  Net -800 ml   Filed Weights   07/06/23 1424 07/09/23 0709  Weight: 54 kg 56.6 kg    Exam: Awake Alert, Oriented *3, No new F.N deficits, Normal affect [place].AT,PERRAL Supple Neck,No JVD, No cervical lymphadenopathy appriciated.  Symmetrical Chest wall movement, Good air movement bilaterally, CTAB RRR,No Gallops,Rubs or new Murmurs, No Parasternal Heave +ve B.Sounds, Abd Soft, Non tender, No organomegaly appriciated, No rebound -guarding or rigidity. No Cyanosis, Clubbing or edema, No new Rash or bruise   PERTINENT RADIOLOGIC STUDIES: DG Swallowing Func-Speech Pathology  Result Date: 07/08/2023 Table formatting from the original result was not included. Modified Barium Swallow Study Patient Details Name: Yolanda [name] MRN: [id number] Date of Birth: 1933-01-21 Today's Date: 07/08/2023 HPI/PMH: HPI: Yolanda [name] is an 89 yo female presenting to ED 9/14 with R sided weakness and speech difficulty. 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 Recently discharged from [facility] 9/13. Most recently seen by SLP 9/12 tolerating Dys 3 diet with thin liquids. 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 The study was limtied by pt's excessive movement and difficulty maintaining adequate positioning. Pt consistently initiating the swallow at the level of the pyriform sinuses across all boluses. Trials of thin liquids resulted in misitiming of laryngeal vestibule closure with trace, transient penetration from the pyriform sinuses as the swallow is initiated. No further penetration/aspiration noted with trials of nectar thick liquids or purees. Pt with reduced labial seal, likely due to R sided weakness and resulting in significant anterior spillage. When presented with trials of solids, pt made no attempt to masticate or initiate swallow given Max verbal and visual cueing. Suspect cognition is inhibitng her ability to safely initiate a regular texture solid diet at this time, but feel that she shows potential to progress as she d/c and is exposed to familiar foods with family support. Recommend initiating a diet of Dys 1 textured solids with thin liquids at this time. Meds should be given whole in puree. Discussed results of MBS and diet recommendation as well as potential to upgrade with her son, who was present for the entirety of the study. Will continue to follow. Factors that may increase risk of adverse event in presence of aspiration Yolanda [name] & Yolanda [name] 2021): Factors that may increase risk of adverse event in presence of aspiration Yolanda [name] & Yolanda [name] 2021): Poor general health and/or compromised immunity; Reduced cognitive function; Frail or deconditioned Recommendations/Plan: Swallowing Evaluation Recommendations Swallowing Evaluation Recommendations Recommendations: PO diet PO Diet Recommendation: Dysphagia 1 (Pureed); Thin liquids (Level 0) Liquid Administration via: Cup; Straw Medication Administration: Whole meds with puree Supervision: Staff to assist with self-feeding; Full supervision/cueing for  swallowing strategies Swallowing strategies  : Minimize environmental distractions; Slow rate; Small bites/sips Postural changes: Stay upright 30-60 min after meals Oral care recommendations: Oral care BID (2x/day) Treatment Plan Treatment Plan Treatment recommendations: Therapy as outlined in treatment plan  no headache,no chest abdominal pain,no new weakness tingling or numbness, feels much better wants to go home today.   Objective:   Blood pressure (!) 158/60, pulse 70, temperature 98.1 F (36.7 C), temperature source Oral, resp. rate 19, height 4\' 8"  (1.422 m), weight 56.6 kg, SpO2 96%.  Intake/Output Summary (Last 24 hours) at 07/10/2023 0857 Last data filed at 07/10/2023 0600 Gross per 24 hour  Intake --  Output 800 ml  Net -800 ml   Filed Weights   07/06/23 1424 07/09/23 0709  Weight: 54 kg 56.6 kg    Exam: Awake Alert, Oriented *3, No new F.N deficits, Normal affect [place].AT,PERRAL Supple Neck,No JVD, No cervical lymphadenopathy appriciated.  Symmetrical Chest wall movement, Good air movement bilaterally, CTAB RRR,No Gallops,Rubs or new Murmurs, No Parasternal Heave +ve B.Sounds, Abd Soft, Non tender, No organomegaly appriciated, No rebound -guarding or rigidity. No Cyanosis, Clubbing or edema, No new Rash or bruise   PERTINENT RADIOLOGIC STUDIES: DG Swallowing Func-Speech Pathology  Result Date: 07/08/2023 Table formatting from the original result was not included. Modified Barium Swallow Study Patient Details Name: Yolanda [name] MRN: [id number] Date of Birth: 1933-01-21 Today's Date: 07/08/2023 HPI/PMH: HPI: Yolanda [name] is an 89 yo female presenting to ED 9/14 with R sided weakness and speech difficulty. 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 Recently discharged from [facility] 9/13. Most recently seen by SLP 9/12 tolerating Dys 3 diet with thin liquids. 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 The study was limtied by pt's excessive movement and difficulty maintaining adequate positioning. Pt consistently initiating the swallow at the level of the pyriform sinuses across all boluses. Trials of thin liquids resulted in misitiming of laryngeal vestibule closure with trace, transient penetration from the pyriform sinuses as the swallow is initiated. No further penetration/aspiration noted with trials of nectar thick liquids or purees. Pt with reduced labial seal, likely due to R sided weakness and resulting in significant anterior spillage. When presented with trials of solids, pt made no attempt to masticate or initiate swallow given Max verbal and visual cueing. Suspect cognition is inhibitng her ability to safely initiate a regular texture solid diet at this time, but feel that she shows potential to progress as she d/c and is exposed to familiar foods with family support. Recommend initiating a diet of Dys 1 textured solids with thin liquids at this time. Meds should be given whole in puree. Discussed results of MBS and diet recommendation as well as potential to upgrade with her son, who was present for the entirety of the study. Will continue to follow. Factors that may increase risk of adverse event in presence of aspiration Yolanda [name] & Yolanda [name] 2021): Factors that may increase risk of adverse event in presence of aspiration Yolanda [name] & Yolanda [name] 2021): Poor general health and/or compromised immunity; Reduced cognitive function; Frail or deconditioned Recommendations/Plan: Swallowing Evaluation Recommendations Swallowing Evaluation Recommendations Recommendations: PO diet PO Diet Recommendation: Dysphagia 1 (Pureed); Thin liquids (Level 0) Liquid Administration via: Cup; Straw Medication Administration: Whole meds with puree Supervision: Staff to assist with self-feeding; Full supervision/cueing for  swallowing strategies Swallowing strategies  : Minimize environmental distractions; Slow rate; Small bites/sips Postural changes: Stay upright 30-60 min after meals Oral care recommendations: Oral care BID (2x/day) Treatment Plan Treatment Plan Treatment recommendations: Therapy as outlined in treatment plan  these medications    acetaminophen 325 MG tablet Commonly known as: TYLENOL Take 2 tablets (650 mg total) by mouth 3 (three) times daily as needed.   aspirin 81 MG chewable tablet Chew 1 tablet (81 mg total) by mouth daily.   bisoprolol 5 MG tablet Commonly known as: ZEBETA Take 1 tablet (5 mg total) by mouth daily.   butalbital-acetaminophen-caffeine 50-325-40 MG tablet Commonly known as: FIORICET Take 1 tablet by mouth every 6  (six) hours as needed for headache.   gabapentin 100 MG capsule Commonly known as: NEURONTIN Take 1 capsule (100 mg total) by mouth 3 (three) times daily.   ramelteon 8 MG tablet Commonly known as: ROZEREM Take 1 tablet (8 mg total) by mouth at bedtime.   rosuvastatin 20 MG tablet Commonly known as: CRESTOR Take 1 tablet (20 mg total) by mouth daily.   sertraline 25 MG tablet Commonly known as: ZOLOFT Take 1 tablet (25 mg total) by mouth daily.   tamsulosin 0.4 MG Caps capsule Commonly known as: FLOMAX Take 1 capsule (0.4 mg total) by mouth daily after supper.   topiramate 100 MG tablet Commonly known as: TOPAMAX Take 1 tablet (100 mg total) by mouth at bedtime.   traZODone 100 MG tablet Commonly known as: DESYREL Take 1 tablet (100 mg total) by mouth at bedtime.   Vitamin D (Ergocalciferol) 1.25 MG (50000 UNIT) Caps capsule Commonly known as: DRISDOL Take 1 capsule (50,000 Units total) by mouth every 7 (seven) days. Take each Friday until finished.               Durable Medical Equipment  (From admission, onward)           Start     Ordered   07/09/23 1334  For home use only DME Hospital bed  Once       Comments: [facility] [place]  Question Answer Comment  Length of Need Lifetime   Patient has (list medical condition): intracranial hemmorhage   The above medical condition requires: Patient requires the ability to reposition frequently   Head must be elevated greater than: 30 degrees   Bed type Semi-electric      07/09/23 1339            Follow-up Information     Micki Riley, MD. Schedule an appointment as soon as possible for a visit in 1 month(s).   Specialties: Neurology, Radiology Why: stroke clinic Contact information: [address] [place] [address] [phone]                Allergies  Allergen Reactions   Meat Extract Other (See Comments)    Pt vegetarian      Other Procedures/Studies: DG  Swallowing Func-Speech Pathology  Result Date: 07/08/2023 Table formatting from the original result was not included. Modified Barium Swallow Study Patient Details Name: Yolanda [name] MRN: [id number] Date of Birth: Nov 07, 1932 Today's Date: 07/08/2023 HPI/PMH: HPI: Yolanda [name] is an 89 yo female presenting to ED 9/14 with R sided weakness and speech difficulty. 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 Recently discharged from [facility] 9/13. Most recently seen by SLP 9/12 tolerating Dys 3 diet with thin liquids. 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 The study was limtied by pt's excessive movement and difficulty maintaining adequate positioning. Pt consistently  not insonated +----------+--------------+----------+-----------+-------------+ Term ICA        25                   1.54                  +----------+--------------+----------+-----------+-------------+ PCA P1          26                   1.57                  +----------+--------------+----------+-----------+-------------+ Opthalmic       16                   1.94                   +----------+--------------+----------+-----------+-------------+ ICA siphon      25                   1.69                  +----------+--------------+----------+-----------+-------------+ Vertebral      -15                   1.24                  +----------+--------------+----------+-----------+-------------+ Distal ICA     -19                   1.60                  +----------+--------------+----------+-----------+-------------+  +------------+-------+-------+             VM cm/sComment +------------+-------+-------+ Prox Basilar  -24   1.64   +------------+-------+-------+ Dist Basilar  -26   1.63   +------------+-------+-------+ +----------------------+----+ Right [place] Ratio2.40 +----------------------+----+ +---------------------+----+ Left [place] Ratio1.16 +---------------------+----+  Summary:  Low mean flow velocities in left middle cerebral artery suggestive of distal stenosis. Globally elevated pulsatility indices likely suggest diffuse intracranial atherosclerosis *See table(s) above for TCD measurements and observations.  Diagnosing physician: Delia Heady MD Electronically signed by Delia Heady MD on 06/20/2023 at 12:18:39 PM.    Final    VAS [place] TRANSCRANIAL DOPPLER  Result Date: 06/18/2023  Transcranial Doppler Patient Name:  Yolanda [name]  Date of Exam:   06/17/2023 Medical Rec #: [id number]     Accession #:    [id number] Date of Birth: 06/04/1933     Patient Gender: F Patient Age:   48 years Exam Location:  [facility] Procedure:      VAS [place] TRANSCRANIAL DOPPLER Referring Phys: Maralyn Sago GROCE --------------------------------------------------------------------------------  Indications: Subarachnoid hemorrhage. Limitations: Technically limited study due to patient frequent movement and              patient's position. Limitations for diagnostic windows: Unable to insonate right transtemporal window. Performing Technologist: Marilynne Halsted RDMS, RVT   Examination Guidelines: A complete evaluation includes B-mode imaging, spectral Doppler, color Doppler, and power Doppler as needed of all accessible portions of each vessel. Bilateral testing is considered an integral part of a complete examination. Limited examinations for reoccurring indications may be performed as noted.  +----------+---------------+----------+-----------+-------------+ RIGHT TCD Right VM (cm/s)Depth (cm)Pulsatility   Comment    +----------+---------------+----------+-----------+-------------+ MCA  not insonated +----------+--------------+----------+-----------+-------------+ Vertebral      -29                                         +----------+--------------+----------+-----------+-------------+ Distal ICA                                   not insonated +----------+--------------+----------+-----------+-------------+  +------------+-------+-------------+             VM cm/s   Comment    +------------+-------+-------------+ Prox Basilar       not insonated +------------+-------+-------------+ Summary:  Absent right temporal and poor suboccipital window limits evaluation.Normal mean flow velocities in identified vessels of anterior and posterior cerebral circulations without evidence of definite vasospasm noted. *See table(s) above for TCD measurements and observations.  Diagnosing physician: Delia Heady MD Electronically signed by Delia Heady MD on 06/18/2023 at 5:28:18 PM.    Final    VAS [place] TRANSCRANIAL DOPPLER  Result Date: 06/18/2023  Transcranial Doppler Patient Name:  Yolanda [name]  Date of Exam:   06/14/2023 Medical Rec #: [id number]     Accession #:    [id number] Date of Birth: 02-16-1933     Patient Gender: F Patient Age:   61 years Exam Location:  [facility] Procedure:      VAS [place] TRANSCRANIAL DOPPLER Referring Phys: Maralyn Sago GROCE --------------------------------------------------------------------------------  Indications: Subarachnoid hemorrhage.  Limitations for diagnostic windows: Unable to insonate occipital window. Comparison Study: 06/12/23 Performing Technologist: Shona Simpson  Examination Guidelines: A complete evaluation includes B-mode imaging, spectral Doppler, color Doppler, and power Doppler as needed of all accessible portions of each vessel. Bilateral testing is considered an integral part of a complete examination. Limited examinations for reoccurring indications may be performed as noted.  +----------+---------------+----------+-----------+-------+ RIGHT TCD Right VM (cm/s)Depth (cm)PulsatilityComment +----------+---------------+----------+-----------+-------+ MCA             74                     1.8            +----------+---------------+----------+-----------+-------+ ACA             48                     2.1            +----------+---------------+----------+-----------+-------+ Term ICA        45                     1.9            +----------+---------------+----------+-----------+-------+ PCA P1          24                     1.7            +----------+---------------+----------+-----------+-------+ Opthalmic       36                     1.9            +----------+---------------+----------+-----------+-------+ ICA siphon      26                     2.1            +----------+---------------+----------+-----------+-------+  Distal ICA      18                     1.6            +----------+---------------+----------+-----------+-------+  +----------+--------------+----------+-----------+-------+ LEFT TCD  Left VM (cm/s)Depth (cm)PulsatilityComment +----------+--------------+----------+-----------+-------+ MCA             32                    2.3            +----------+--------------+----------+-----------+-------+ ACA             70                                   +----------+--------------+----------+-----------+-------+ Term ICA        39                    2.0             +----------+--------------+----------+-----------+-------+ PCA P1          25                    1.8            +----------+--------------+----------+-----------+-------+ Opthalmic       24                    1.9            +----------+--------------+----------+-----------+-------+ ICA siphon      47                    1.6            +----------+--------------+----------+-----------+-------+ Distal ICA      19                    1.6            +----------+--------------+----------+-----------+-------+  +----------------------+---+ Right [place] Ratio4.1 +----------------------+---+ +---------------------+----+ Left [place] Ratio1.68 +---------------------+----+  Summary:  Mildly elevated right middle cerebral and left anterior cerebral artery mean flow velocitie sof unclear significance. globally elevate dpulsatility indices suggest diffuse increase in intracranial pressure versus increased intracranial atherosclerosis *See table(s) above for TCD measurements and observations.  Diagnosing physician: Delia Heady MD Electronically signed by Delia Heady MD on 06/18/2023 at 5:26:39 PM.    Final    VAS [place] TRANSCRANIAL DOPPLER  Result Date: 06/18/2023  Transcranial Doppler Patient Name:  Yolanda [name]  Date of Exam:   06/12/2023 Medical Rec #: [id number]     Accession #:    [id number] Date of Birth: Aug 07, 1933     Patient Gender: F Patient Age:   27 years Exam Location:  [facility] Procedure:      VAS [place] TRANSCRANIAL DOPPLER Referring Phys: Maralyn Sago GROCE --------------------------------------------------------------------------------  Indications: Subarachnoid hemorrhage. Limitations for diagnostic windows: Unable to insonate occipital window. Comparison Study: No prior study Performing Technologist: Shona Simpson  Examination Guidelines: A complete evaluation includes B-mode imaging, spectral Doppler, color Doppler, and power Doppler as needed of all accessible  portions of each vessel. Bilateral testing is considered an integral part of a complete examination. Limited examinations for reoccurring indications may be performed as noted.  +----------+---------------+----------+-----------+-------+ RIGHT TCD Right VM (cm/s)Depth (cm)PulsatilityComment +----------+---------------+----------+-----------+-------+ MCA             57  PERO    Full                                                               +--------+---------------+---------+-----------+----------+--------------------+     Summary: BILATERAL: - No evidence of deep vein thrombosis seen in the lower extremities, bilaterally. -No evidence of popliteal cyst, bilaterally.   *See table(s) above for measurements and observations. Electronically signed by Heath Lark on 06/30/2023 at 11:13:00 AM.    Final    VAS [place] TRANSCRANIAL DOPPLER  Result Date: 06/25/2023  Transcranial Doppler Patient Name:  Yolanda [name]  Date of Exam:   06/21/2023 Medical Rec #: [id number]     Accession #:    [id number] Date of Birth: November 21, 1932     Patient Gender: F Patient Age:   8 years Exam Location:  [facility] Procedure:      VAS [place] TRANSCRANIAL DOPPLER Referring Phys: Maralyn Sago GROCE --------------------------------------------------------------------------------  Indications: Subarachnoid hemorrhage. Limitations: Today's examination was technically difficult. Constant patient              movement/patient agitated. Performing Technologist: Jean Rosenthal RDMS, RVT  Examination Guidelines: A complete evaluation includes B-mode imaging, spectral Doppler, color Doppler, and power Doppler as needed of all accessible portions of each vessel. Bilateral testing is considered an integral part of a complete examination. Limited examinations for reoccurring indications may be performed as noted.  +----------+---------------+----------+-----------+------------------+ RIGHT TCD Right VM (cm/s)Depth (cm)Pulsatility     Comment       +----------+---------------+----------+-----------+------------------+ MCA             48                    1.44                       +----------+---------------+----------+-----------+------------------+ ACA             -28                   1.25                       +----------+---------------+----------+-----------+------------------+ Term ICA        23                     1.52                       +----------+---------------+----------+-----------+------------------+ PCA P1          17                    1.50                       +----------+---------------+----------+-----------+------------------+ Opthalmic       24                    1.68                       +----------+---------------+----------+-----------+------------------+ ICA siphon  no headache,no chest abdominal pain,no new weakness tingling or numbness, feels much better wants to go home today.   Objective:   Blood pressure (!) 158/60, pulse 70, temperature 98.1 F (36.7 C), temperature source Oral, resp. rate 19, height 4\' 8"  (1.422 m), weight 56.6 kg, SpO2 96%.  Intake/Output Summary (Last 24 hours) at 07/10/2023 0857 Last data filed at 07/10/2023 0600 Gross per 24 hour  Intake --  Output 800 ml  Net -800 ml   Filed Weights   07/06/23 1424 07/09/23 0709  Weight: 54 kg 56.6 kg    Exam: Awake Alert, Oriented *3, No new F.N deficits, Normal affect [place].AT,PERRAL Supple Neck,No JVD, No cervical lymphadenopathy appriciated.  Symmetrical Chest wall movement, Good air movement bilaterally, CTAB RRR,No Gallops,Rubs or new Murmurs, No Parasternal Heave +ve B.Sounds, Abd Soft, Non tender, No organomegaly appriciated, No rebound -guarding or rigidity. No Cyanosis, Clubbing or edema, No new Rash or bruise   PERTINENT RADIOLOGIC STUDIES: DG Swallowing Func-Speech Pathology  Result Date: 07/08/2023 Table formatting from the original result was not included. Modified Barium Swallow Study Patient Details Name: Yolanda [name] MRN: [id number] Date of Birth: 1933-01-21 Today's Date: 07/08/2023 HPI/PMH: HPI: Yolanda [name] is an 89 yo female presenting to ED 9/14 with R sided weakness and speech difficulty. 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 Recently discharged from [facility] 9/13. Most recently seen by SLP 9/12 tolerating Dys 3 diet with thin liquids. 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 The study was limtied by pt's excessive movement and difficulty maintaining adequate positioning. Pt consistently initiating the swallow at the level of the pyriform sinuses across all boluses. Trials of thin liquids resulted in misitiming of laryngeal vestibule closure with trace, transient penetration from the pyriform sinuses as the swallow is initiated. No further penetration/aspiration noted with trials of nectar thick liquids or purees. Pt with reduced labial seal, likely due to R sided weakness and resulting in significant anterior spillage. When presented with trials of solids, pt made no attempt to masticate or initiate swallow given Max verbal and visual cueing. Suspect cognition is inhibitng her ability to safely initiate a regular texture solid diet at this time, but feel that she shows potential to progress as she d/c and is exposed to familiar foods with family support. Recommend initiating a diet of Dys 1 textured solids with thin liquids at this time. Meds should be given whole in puree. Discussed results of MBS and diet recommendation as well as potential to upgrade with her son, who was present for the entirety of the study. Will continue to follow. Factors that may increase risk of adverse event in presence of aspiration Yolanda [name] & Yolanda [name] 2021): Factors that may increase risk of adverse event in presence of aspiration Yolanda [name] & Yolanda [name] 2021): Poor general health and/or compromised immunity; Reduced cognitive function; Frail or deconditioned Recommendations/Plan: Swallowing Evaluation Recommendations Swallowing Evaluation Recommendations Recommendations: PO diet PO Diet Recommendation: Dysphagia 1 (Pureed); Thin liquids (Level 0) Liquid Administration via: Cup; Straw Medication Administration: Whole meds with puree Supervision: Staff to assist with self-feeding; Full supervision/cueing for  swallowing strategies Swallowing strategies  : Minimize environmental distractions; Slow rate; Small bites/sips Postural changes: Stay upright 30-60 min after meals Oral care recommendations: Oral care BID (2x/day) Treatment Plan Treatment Plan Treatment recommendations: Therapy as outlined in treatment plan  PATIENT DETAILS Name: Yolanda [name] Age: 89 y.o. Sex: female Date of Birth: 1933-10-04 MRN: [id number]. Admitting Physician: Clydie Braun, MD PCP:Pcp, No  Admit Date: 07/06/2023 Discharge date: 07/10/2023  Recommendations for Outpatient Follow-up:  Follow up with PCP in 1-2 weeks Please obtain CMP/CBC in one week Please ensure follow up with Neurology  Admitted From:  Home  Disposition: Home health (family declined SNF)   Discharge Condition: good  CODE STATUS:   Code Status: Limited: Do not attempt resuscitation (DNR) -DNR-LIMITED -Do Not Intubate/DNI    Diet recommendation:  Diet Order             Diet - low sodium heart healthy           Diet full liquid Room service appropriate? Yes with Assist; Fluid consistency: Thin  Diet effective now                    Brief Summary: Patient is a 89 y.o.  female with recent history of SAH-s/p left MCA aneurysm coiling-just discharged home from [facility] on 9/13-presented to the hospital on 9/14 with aphasia/right-sided weakness-she was found to have acute CVA and subsequently admitted to the hospitalist service.   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 9/15>> Spot EEG: No seizures   Significant microbiology data: 9/14>> COVID/influenza/RSV PCR: Negative   Procedures: None   Consults: Neurology Neurosurgery  Brief Hospital Course: Acute CVA Either secondary to delayed vasospasm-or thrombotic in nature from underlying aneurysms with wide base  Neuroexam is gradually improved-she was initially aphasic-she is not able to say 1-2 words, right-sided  hemiparesis is better.   Workup as above No further recommendations from neurosurgery-no role for angiogram in this elderly patient. Neurology followed closely-recommendations are to discharge on aspirin/statin.  Please ensure outpatient follow-up with neurology.   Recent SAH-with call embolization of left MCA aneurysm CTA as above Appreciate neurosurgical input-continue to treat as ischemic stroke.   Dysphagia Likely secondary to acute CVA-superimposed on recent SAH Tolerating dysphagia 1 diet  SLP following close-Home health services including SLP ordered on discharge.   Acute metabolic encephalopathy likely combination of CVA-possible aspiration pneumonia Much awake and alert compared to how she was on admission.   Possible aspiration pneumonia/atelectasis Patchy airspace opacities noted on CTA-likely reflecting aspiration-patient asymptomatic-hold off on starting antibiotics at this point. SLP evaluation appreciate.   Chronic HFpEF Euvolemic   Hypokalemia Repleted.   HTN Permissive hypertension Bisoprolol initially held-will be resumed on discharge.   Mild intermittent asthma Bronchodilators   Migraine headaches As needed Tylenol for now Resume Topamax on discharge.   History of urinary retention Flomax was started-during recent hospitalization Resume Flomax-no evidence of urinary retention-had external female catheter during this hospitalization.   Palliative care/goals of care Multiple discussions with the family-DNR Plan was to allow for clinical outcomes-thankfully she has improved-with improving right-sided weakness-she is now able to order 1-2 words-and easily tolerating dysphagia 1 diet.   Initially when she first presented-hospice was contemplated-but given gradual improvement-family keen on taking her home and not to SNF.  Do not feel hospice is appropriate at this time given clinical improvement.     BMI: Estimated body mass index  PATIENT DETAILS Name: Yolanda [name] Age: 89 y.o. Sex: female Date of Birth: 1933-10-04 MRN: [id number]. Admitting Physician: Clydie Braun, MD PCP:Pcp, No  Admit Date: 07/06/2023 Discharge date: 07/10/2023  Recommendations for Outpatient Follow-up:  Follow up with PCP in 1-2 weeks Please obtain CMP/CBC in one week Please ensure follow up with Neurology  Admitted From:  Home  Disposition: Home health (family declined SNF)   Discharge Condition: good  CODE STATUS:   Code Status: Limited: Do not attempt resuscitation (DNR) -DNR-LIMITED -Do Not Intubate/DNI    Diet recommendation:  Diet Order             Diet - low sodium heart healthy           Diet full liquid Room service appropriate? Yes with Assist; Fluid consistency: Thin  Diet effective now                    Brief Summary: Patient is a 89 y.o.  female with recent history of SAH-s/p left MCA aneurysm coiling-just discharged home from [facility] on 9/13-presented to the hospital on 9/14 with aphasia/right-sided weakness-she was found to have acute CVA and subsequently admitted to the hospitalist service.   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 9/15>> Spot EEG: No seizures   Significant microbiology data: 9/14>> COVID/influenza/RSV PCR: Negative   Procedures: None   Consults: Neurology Neurosurgery  Brief Hospital Course: Acute CVA Either secondary to delayed vasospasm-or thrombotic in nature from underlying aneurysms with wide base  Neuroexam is gradually improved-she was initially aphasic-she is not able to say 1-2 words, right-sided  hemiparesis is better.   Workup as above No further recommendations from neurosurgery-no role for angiogram in this elderly patient. Neurology followed closely-recommendations are to discharge on aspirin/statin.  Please ensure outpatient follow-up with neurology.   Recent SAH-with call embolization of left MCA aneurysm CTA as above Appreciate neurosurgical input-continue to treat as ischemic stroke.   Dysphagia Likely secondary to acute CVA-superimposed on recent SAH Tolerating dysphagia 1 diet  SLP following close-Home health services including SLP ordered on discharge.   Acute metabolic encephalopathy likely combination of CVA-possible aspiration pneumonia Much awake and alert compared to how she was on admission.   Possible aspiration pneumonia/atelectasis Patchy airspace opacities noted on CTA-likely reflecting aspiration-patient asymptomatic-hold off on starting antibiotics at this point. SLP evaluation appreciate.   Chronic HFpEF Euvolemic   Hypokalemia Repleted.   HTN Permissive hypertension Bisoprolol initially held-will be resumed on discharge.   Mild intermittent asthma Bronchodilators   Migraine headaches As needed Tylenol for now Resume Topamax on discharge.   History of urinary retention Flomax was started-during recent hospitalization Resume Flomax-no evidence of urinary retention-had external female catheter during this hospitalization.   Palliative care/goals of care Multiple discussions with the family-DNR Plan was to allow for clinical outcomes-thankfully she has improved-with improving right-sided weakness-she is now able to order 1-2 words-and easily tolerating dysphagia 1 diet.   Initially when she first presented-hospice was contemplated-but given gradual improvement-family keen on taking her home and not to SNF.  Do not feel hospice is appropriate at this time given clinical improvement.     BMI: Estimated body mass index  Result Date: 06/20/2023  Transcranial Doppler Patient Name:  Yolanda [name]  Date of Exam:   06/19/2023 Medical Rec #: [id number]     Accession #:    [id number] Date of Birth: 01-Jul-1933     Patient Gender: F Patient Age:   50 years Exam Location:  [facility] Procedure:      VAS [place] TRANSCRANIAL DOPPLER Referring Phys: Maralyn Sago GROCE --------------------------------------------------------------------------------  Indications: Subarachnoid hemorrhage. Comparison Study: Previous exam 06/17/2023 Performing Technologist: Ernestene Mention RVT, RDMS  Examination Guidelines: A complete evaluation includes B-mode imaging, spectral Doppler, color Doppler, and power Doppler as needed of all accessible portions of each vessel. Bilateral testing is considered an integral part of a complete examination. Limited examinations for reoccurring indications may be performed as noted.  +----------+---------------+----------+-----------+-------------+ RIGHT TCD Right VM  (cm/s)Depth (cm)Pulsatility   Comment    +----------+---------------+----------+-----------+-------------+ MCA             48                    1.57                  +----------+---------------+----------+-----------+-------------+ ACA                                           not insonated +----------+---------------+----------+-----------+-------------+ Term ICA        27                    1.55                  +----------+---------------+----------+-----------+-------------+ PCA P1          21                    1.54                  +----------+---------------+----------+-----------+-------------+ Opthalmic       17                    2.00                  +----------+---------------+----------+-----------+-------------+ ICA siphon                                    not insonated +----------+---------------+----------+-----------+-------------+ Vertebral       -18                   1.47                  +----------+---------------+----------+-----------+-------------+ Distal ICA      -20                   1.74                  +----------+---------------+----------+-----------+-------------+  +----------+--------------+----------+-----------+-------------+ LEFT TCD  Left VM (cm/s)Depth (cm)Pulsatility   Comment    +----------+--------------+----------+-----------+-------------+ MCA             22                   1.54                  +----------+--------------+----------+-----------+-------------+ ACA  these medications    acetaminophen 325 MG tablet Commonly known as: TYLENOL Take 2 tablets (650 mg total) by mouth 3 (three) times daily as needed.   aspirin 81 MG chewable tablet Chew 1 tablet (81 mg total) by mouth daily.   bisoprolol 5 MG tablet Commonly known as: ZEBETA Take 1 tablet (5 mg total) by mouth daily.   butalbital-acetaminophen-caffeine 50-325-40 MG tablet Commonly known as: FIORICET Take 1 tablet by mouth every 6  (six) hours as needed for headache.   gabapentin 100 MG capsule Commonly known as: NEURONTIN Take 1 capsule (100 mg total) by mouth 3 (three) times daily.   ramelteon 8 MG tablet Commonly known as: ROZEREM Take 1 tablet (8 mg total) by mouth at bedtime.   rosuvastatin 20 MG tablet Commonly known as: CRESTOR Take 1 tablet (20 mg total) by mouth daily.   sertraline 25 MG tablet Commonly known as: ZOLOFT Take 1 tablet (25 mg total) by mouth daily.   tamsulosin 0.4 MG Caps capsule Commonly known as: FLOMAX Take 1 capsule (0.4 mg total) by mouth daily after supper.   topiramate 100 MG tablet Commonly known as: TOPAMAX Take 1 tablet (100 mg total) by mouth at bedtime.   traZODone 100 MG tablet Commonly known as: DESYREL Take 1 tablet (100 mg total) by mouth at bedtime.   Vitamin D (Ergocalciferol) 1.25 MG (50000 UNIT) Caps capsule Commonly known as: DRISDOL Take 1 capsule (50,000 Units total) by mouth every 7 (seven) days. Take each Friday until finished.               Durable Medical Equipment  (From admission, onward)           Start     Ordered   07/09/23 1334  For home use only DME Hospital bed  Once       Comments: [employer] [facility]  Question Answer Comment  Length of Need Lifetime   Patient has (list medical condition): intracranial hemmorhage   The above medical condition requires: Patient requires the ability to reposition frequently   Head must be elevated greater than: 30 degrees   Bed type Semi-electric      07/09/23 1339            Follow-up Information     Micki Riley, MD. Schedule an appointment as soon as possible for a visit in 1 month(s).   Specialties: Neurology, Radiology Why: stroke clinic Contact information: [address] [place] [address] [phone]                Allergies  Allergen Reactions   Meat Extract Other (See Comments)    Pt vegetarian      Other Procedures/Studies: DG  Swallowing Func-Speech Pathology  Result Date: 07/08/2023 Table formatting from the original result was not included. Modified Barium Swallow Study Patient Details Name: Yolanda [name] MRN: [id number] Date of Birth: Nov 07, 1932 Today's Date: 07/08/2023 HPI/PMH: HPI: Yolanda [name] is an 89 yo female presenting to ED 9/14 with R sided weakness and speech difficulty. 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 Recently discharged from [facility] 9/13. Most recently seen by SLP 9/12 tolerating Dys 3 diet with thin liquids. 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 The study was limtied by pt's excessive movement and difficulty maintaining adequate positioning. Pt consistently  these medications    acetaminophen 325 MG tablet Commonly known as: TYLENOL Take 2 tablets (650 mg total) by mouth 3 (three) times daily as needed.   aspirin 81 MG chewable tablet Chew 1 tablet (81 mg total) by mouth daily.   bisoprolol 5 MG tablet Commonly known as: ZEBETA Take 1 tablet (5 mg total) by mouth daily.   butalbital-acetaminophen-caffeine 50-325-40 MG tablet Commonly known as: FIORICET Take 1 tablet by mouth every 6  (six) hours as needed for headache.   gabapentin 100 MG capsule Commonly known as: NEURONTIN Take 1 capsule (100 mg total) by mouth 3 (three) times daily.   ramelteon 8 MG tablet Commonly known as: ROZEREM Take 1 tablet (8 mg total) by mouth at bedtime.   rosuvastatin 20 MG tablet Commonly known as: CRESTOR Take 1 tablet (20 mg total) by mouth daily.   sertraline 25 MG tablet Commonly known as: ZOLOFT Take 1 tablet (25 mg total) by mouth daily.   tamsulosin 0.4 MG Caps capsule Commonly known as: FLOMAX Take 1 capsule (0.4 mg total) by mouth daily after supper.   topiramate 100 MG tablet Commonly known as: TOPAMAX Take 1 tablet (100 mg total) by mouth at bedtime.   traZODone 100 MG tablet Commonly known as: DESYREL Take 1 tablet (100 mg total) by mouth at bedtime.   Vitamin D (Ergocalciferol) 1.25 MG (50000 UNIT) Caps capsule Commonly known as: DRISDOL Take 1 capsule (50,000 Units total) by mouth every 7 (seven) days. Take each Friday until finished.               Durable Medical Equipment  (From admission, onward)           Start     Ordered   07/09/23 1334  For home use only DME Hospital bed  Once       Comments: [employer] [facility]  Question Answer Comment  Length of Need Lifetime   Patient has (list medical condition): intracranial hemmorhage   The above medical condition requires: Patient requires the ability to reposition frequently   Head must be elevated greater than: 30 degrees   Bed type Semi-electric      07/09/23 1339            Follow-up Information     Micki Riley, MD. Schedule an appointment as soon as possible for a visit in 1 month(s).   Specialties: Neurology, Radiology Why: stroke clinic Contact information: [address] [place] [address] [phone]                Allergies  Allergen Reactions   Meat Extract Other (See Comments)    Pt vegetarian      Other Procedures/Studies: DG  Swallowing Func-Speech Pathology  Result Date: 07/08/2023 Table formatting from the original result was not included. Modified Barium Swallow Study Patient Details Name: Yolanda [name] MRN: [id number] Date of Birth: Nov 07, 1932 Today's Date: 07/08/2023 HPI/PMH: HPI: Yolanda [name] is an 89 yo female presenting to ED 9/14 with R sided weakness and speech difficulty. 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 Recently discharged from [facility] 9/13. Most recently seen by SLP 9/12 tolerating Dys 3 diet with thin liquids. 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 The study was limtied by pt's excessive movement and difficulty maintaining adequate positioning. Pt consistently  PERO    Full                                                               +--------+---------------+---------+-----------+----------+--------------------+     Summary: BILATERAL: - No evidence of deep vein thrombosis seen in the lower extremities, bilaterally. -No evidence of popliteal cyst, bilaterally.   *See table(s) above for measurements and observations. Electronically signed by Heath Lark on 06/30/2023 at 11:13:00 AM.    Final    VAS [place] TRANSCRANIAL DOPPLER  Result Date: 06/25/2023  Transcranial Doppler Patient Name:  Yolanda [name]  Date of Exam:   06/21/2023 Medical Rec #: [id number]     Accession #:    [id number] Date of Birth: November 21, 1932     Patient Gender: F Patient Age:   8 years Exam Location:  [facility] Procedure:      VAS [place] TRANSCRANIAL DOPPLER Referring Phys: Maralyn Sago GROCE --------------------------------------------------------------------------------  Indications: Subarachnoid hemorrhage. Limitations: Today's examination was technically difficult. Constant patient              movement/patient agitated. Performing Technologist: Jean Rosenthal RDMS, RVT  Examination Guidelines: A complete evaluation includes B-mode imaging, spectral Doppler, color Doppler, and power Doppler as needed of all accessible portions of each vessel. Bilateral testing is considered an integral part of a complete examination. Limited examinations for reoccurring indications may be performed as noted.  +----------+---------------+----------+-----------+------------------+ RIGHT TCD Right VM (cm/s)Depth (cm)Pulsatility     Comment       +----------+---------------+----------+-----------+------------------+ MCA             48                    1.44                       +----------+---------------+----------+-----------+------------------+ ACA             -28                   1.25                       +----------+---------------+----------+-----------+------------------+ Term ICA        23                     1.52                       +----------+---------------+----------+-----------+------------------+ PCA P1          17                    1.50                       +----------+---------------+----------+-----------+------------------+ Opthalmic       24                    1.68                       +----------+---------------+----------+-----------+------------------+ ICA siphon  not insonated +----------+--------------+----------+-----------+-------------+ Vertebral      -29                                         +----------+--------------+----------+-----------+-------------+ Distal ICA                                   not insonated +----------+--------------+----------+-----------+-------------+  +------------+-------+-------------+             VM cm/s   Comment    +------------+-------+-------------+ Prox Basilar       not insonated +------------+-------+-------------+ Summary:  Absent right temporal and poor suboccipital window limits evaluation.Normal mean flow velocities in identified vessels of anterior and posterior cerebral circulations without evidence of definite vasospasm noted. *See table(s) above for TCD measurements and observations.  Diagnosing physician: Delia Heady MD Electronically signed by Delia Heady MD on 06/18/2023 at 5:28:18 PM.    Final    VAS [place] TRANSCRANIAL DOPPLER  Result Date: 06/18/2023  Transcranial Doppler Patient Name:  Yolanda [name]  Date of Exam:   06/14/2023 Medical Rec #: [id number]     Accession #:    [id number] Date of Birth: 02-16-1933     Patient Gender: F Patient Age:   61 years Exam Location:  [facility] Procedure:      VAS [place] TRANSCRANIAL DOPPLER Referring Phys: Maralyn Sago GROCE --------------------------------------------------------------------------------  Indications: Subarachnoid hemorrhage.  Limitations for diagnostic windows: Unable to insonate occipital window. Comparison Study: 06/12/23 Performing Technologist: Shona Simpson  Examination Guidelines: A complete evaluation includes B-mode imaging, spectral Doppler, color Doppler, and power Doppler as needed of all accessible portions of each vessel. Bilateral testing is considered an integral part of a complete examination. Limited examinations for reoccurring indications may be performed as noted.  +----------+---------------+----------+-----------+-------+ RIGHT TCD Right VM (cm/s)Depth (cm)PulsatilityComment +----------+---------------+----------+-----------+-------+ MCA             74                     1.8            +----------+---------------+----------+-----------+-------+ ACA             48                     2.1            +----------+---------------+----------+-----------+-------+ Term ICA        45                     1.9            +----------+---------------+----------+-----------+-------+ PCA P1          24                     1.7            +----------+---------------+----------+-----------+-------+ Opthalmic       36                     1.9            +----------+---------------+----------+-----------+-------+ ICA siphon      26                     2.1            +----------+---------------+----------+-----------+-------+  no headache,no chest abdominal pain,no new weakness tingling or numbness, feels much better wants to go home today.   Objective:   Blood pressure (!) 158/60, pulse 70, temperature 98.1 F (36.7 C), temperature source Oral, resp. rate 19, height 4\' 8"  (1.422 m), weight 56.6 kg, SpO2 96%.  Intake/Output Summary (Last 24 hours) at 07/10/2023 0857 Last data filed at 07/10/2023 0600 Gross per 24 hour  Intake --  Output 800 ml  Net -800 ml   Filed Weights   07/06/23 1424 07/09/23 0709  Weight: 54 kg 56.6 kg    Exam: Awake Alert, Oriented *3, No new F.N deficits, Normal affect [place].AT,PERRAL Supple Neck,No JVD, No cervical lymphadenopathy appriciated.  Symmetrical Chest wall movement, Good air movement bilaterally, CTAB RRR,No Gallops,Rubs or new Murmurs, No Parasternal Heave +ve B.Sounds, Abd Soft, Non tender, No organomegaly appriciated, No rebound -guarding or rigidity. No Cyanosis, Clubbing or edema, No new Rash or bruise   PERTINENT RADIOLOGIC STUDIES: DG Swallowing Func-Speech Pathology  Result Date: 07/08/2023 Table formatting from the original result was not included. Modified Barium Swallow Study Patient Details Name: Yolanda [name] MRN: [id number] Date of Birth: 1933-01-21 Today's Date: 07/08/2023 HPI/PMH: HPI: Yolanda [name] is an 89 yo female presenting to ED 9/14 with R sided weakness and speech difficulty. 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 Recently discharged from [facility] 9/13. Most recently seen by SLP 9/12 tolerating Dys 3 diet with thin liquids. 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 The study was limtied by pt's excessive movement and difficulty maintaining adequate positioning. Pt consistently initiating the swallow at the level of the pyriform sinuses across all boluses. Trials of thin liquids resulted in misitiming of laryngeal vestibule closure with trace, transient penetration from the pyriform sinuses as the swallow is initiated. No further penetration/aspiration noted with trials of nectar thick liquids or purees. Pt with reduced labial seal, likely due to R sided weakness and resulting in significant anterior spillage. When presented with trials of solids, pt made no attempt to masticate or initiate swallow given Max verbal and visual cueing. Suspect cognition is inhibitng her ability to safely initiate a regular texture solid diet at this time, but feel that she shows potential to progress as she d/c and is exposed to familiar foods with family support. Recommend initiating a diet of Dys 1 textured solids with thin liquids at this time. Meds should be given whole in puree. Discussed results of MBS and diet recommendation as well as potential to upgrade with her son, who was present for the entirety of the study. Will continue to follow. Factors that may increase risk of adverse event in presence of aspiration Yolanda [name] & Yolanda [name] 2021): Factors that may increase risk of adverse event in presence of aspiration Yolanda [name] & Yolanda [name] 2021): Poor general health and/or compromised immunity; Reduced cognitive function; Frail or deconditioned Recommendations/Plan: Swallowing Evaluation Recommendations Swallowing Evaluation Recommendations Recommendations: PO diet PO Diet Recommendation: Dysphagia 1 (Pureed); Thin liquids (Level 0) Liquid Administration via: Cup; Straw Medication Administration: Whole meds with puree Supervision: Staff to assist with self-feeding; Full supervision/cueing for  swallowing strategies Swallowing strategies  : Minimize environmental distractions; Slow rate; Small bites/sips Postural changes: Stay upright 30-60 min after meals Oral care recommendations: Oral care BID (2x/day) Treatment Plan Treatment Plan Treatment recommendations: Therapy as outlined in treatment plan  Result Date: 07/06/2023 CLINICAL DATA:  Code stroke. Neuro deficit, acute, stroke suspected. Recent  subarachnoid hemorrhage treated with endovascular obliteration. EXAM: CT HEAD WITHOUT CONTRAST TECHNIQUE: Contiguous axial images were obtained from the base of the skull through the vertex without intravenous contrast. RADIATION DOSE REDUCTION: This exam was performed according to the departmental dose-optimization program which includes automated exposure control, adjustment of the mA and/or kV according to patient size and/or use of iterative reconstruction technique. COMPARISON:  CT head without contrast 07/03/2023 FINDINGS: Brain: Accounting for streak artifact in the left MCA territory, no acute infarct, hemorrhage or mass lesion is present. Mild atrophy and white matter changes are likely within normal limits for age. Deep brain nuclei are within normal limits. The ventricles are of normal size. No significant extraaxial fluid collection is present. Vascular: Coil mass is present in the left MCA bifurcation. No significant vascular calcifications are present. No hyperdense vessel is present. Skull: No significant extracranial soft tissue lesion is present. Calvarium is intact. No focal lytic or blastic lesions are present. Sinuses/Orbits: The paranasal sinuses and mastoid air cells are clear. The globes and orbits are within normal limits. ASPECTS [facility] Stroke Program Early CT Score) - Ganglionic level infarction (caudate, lentiform nuclei, internal capsule, insula, M1-M3 cortex): 7/7 - Supraganglionic infarction (M4-M6 cortex): 3/3 Total score (0-10 with 10 being normal): 10/10 IMPRESSION: 1. No acute intracranial abnormality. 2. Coil mass in the left MCA bifurcation. 3. Mild atrophy and white matter changes are likely within normal limits for age. 4. Aspects is 10/10. The above was relayed via text pager to Dr. Wilford Corner on 07/06/2023 at 14:33 . Electronically Signed   By: Marin Roberts M.D.   On: 07/06/2023 14:34   CT HEAD WO CONTRAST ( )  Result Date: 07/03/2023 CLINICAL DATA:  Headache,  increasing frequency or severity EXAM: CT HEAD WITHOUT CONTRAST TECHNIQUE: Contiguous axial images were obtained from the base of the skull through the vertex without intravenous contrast. RADIATION DOSE REDUCTION: This exam was performed according to the departmental dose-optimization program which includes automated exposure control, adjustment of the mA and/or kV according to patient size and/or use of iterative reconstruction technique. COMPARISON:  CT Head 06/12/23 FINDINGS: Brain: There is a likely small volume subarachnoid hemorrhage along the left cerebral convexity (series 3, image 23-25). No hydrocephalus. No extra-axial fluid collection. No evidence of intraventricular blood products. No CT evidence of an acute cortical infarct. Partially empty sella. 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 Skull: Normal. Negative for fracture or focal lesion. Sinuses/Orbits: 2 no middle ear or mastoid effusion. Paranasal sinuses are notable for polypoid mucosal thickening in the left maxillary sinus. Orbits are unremarkable. Other: None. IMPRESSION: 1. Small volume residual subarachnoid hemorrhage along the left cerebral convexity. This has decreased compared to 06/10/23 2. 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 Electronically Signed   By: Lorenza Cambridge M.D.   On: 07/03/2023 14:26   VAS [place] LOWER EXTREMITY VENOUS (DVT)  Result Date: 06/30/2023  Lower Venous DVT Study Patient Name:  Yolanda [name]  Date of Exam:   06/28/2023 Medical Rec #: [id number]     Accession #:    [id number] Date of Birth: April 03, 1933     Patient Gender: F Patient Age:   55 years Exam Location:  [facility] Procedure:      VAS [place] LOWER EXTREMITY VENOUS (DVT) Referring Phys: Wendi Maya --------------------------------------------------------------------------------  Indications: "edema". Other Indications: Rehab patient. Limitations: Difficulty imaging left lower extremity due  Result Date: 06/20/2023  Transcranial Doppler Patient Name:  Yolanda [name]  Date of Exam:   06/19/2023 Medical Rec #: [id number]     Accession #:    [id number] Date of Birth: 01-Jul-1933     Patient Gender: F Patient Age:   50 years Exam Location:  [facility] Procedure:      VAS [place] TRANSCRANIAL DOPPLER Referring Phys: Maralyn Sago GROCE --------------------------------------------------------------------------------  Indications: Subarachnoid hemorrhage. Comparison Study: Previous exam 06/17/2023 Performing Technologist: Ernestene Mention RVT, RDMS  Examination Guidelines: A complete evaluation includes B-mode imaging, spectral Doppler, color Doppler, and power Doppler as needed of all accessible portions of each vessel. Bilateral testing is considered an integral part of a complete examination. Limited examinations for reoccurring indications may be performed as noted.  +----------+---------------+----------+-----------+-------------+ RIGHT TCD Right VM  (cm/s)Depth (cm)Pulsatility   Comment    +----------+---------------+----------+-----------+-------------+ MCA             48                    1.57                  +----------+---------------+----------+-----------+-------------+ ACA                                           not insonated +----------+---------------+----------+-----------+-------------+ Term ICA        27                    1.55                  +----------+---------------+----------+-----------+-------------+ PCA P1          21                    1.54                  +----------+---------------+----------+-----------+-------------+ Opthalmic       17                    2.00                  +----------+---------------+----------+-----------+-------------+ ICA siphon                                    not insonated +----------+---------------+----------+-----------+-------------+ Vertebral       -18                   1.47                  +----------+---------------+----------+-----------+-------------+ Distal ICA      -20                   1.74                  +----------+---------------+----------+-----------+-------------+  +----------+--------------+----------+-----------+-------------+ LEFT TCD  Left VM (cm/s)Depth (cm)Pulsatility   Comment    +----------+--------------+----------+-----------+-------------+ MCA             22                   1.54                  +----------+--------------+----------+-----------+-------------+ ACA  not insonated +----------+--------------+----------+-----------+-------------+ Term ICA        25                   1.54                  +----------+--------------+----------+-----------+-------------+ PCA P1          26                   1.57                  +----------+--------------+----------+-----------+-------------+ Opthalmic       16                   1.94                   +----------+--------------+----------+-----------+-------------+ ICA siphon      25                   1.69                  +----------+--------------+----------+-----------+-------------+ Vertebral      -15                   1.24                  +----------+--------------+----------+-----------+-------------+ Distal ICA     -19                   1.60                  +----------+--------------+----------+-----------+-------------+  +------------+-------+-------+             VM cm/sComment +------------+-------+-------+ Prox Basilar  -24   1.64   +------------+-------+-------+ Dist Basilar  -26   1.63   +------------+-------+-------+ +----------------------+----+ Right Lindegaard Ratio2.40 +----------------------+----+ +---------------------+----+ Left Lind[place]ard Ratio1.16 +---------------------+----+  Summary:  Low mean flow velocities in left middle cerebral artery suggestive of distal stenosis. Globally elevated pulsatility indices likely suggest diffuse intracranial atherosclerosis *See table(s) above for TCD measurements and observations.  Diagnosing physician: Delia Heady MD Electronically signed by Delia Heady MD on 06/20/2023 at 12:18:39 PM.    Final    VAS [place] TRANSCRANIAL DOPPLER  Result Date: 06/18/2023  Transcranial Doppler Patient Name:  Yolanda [name]  Date of Exam:   06/17/2023 Medical Rec #: [id number]     Accession #:    [id number] Date of Birth: 06/04/1933     Patient Gender: F Patient Age:   48 years Exam Location:  [facility] Procedure:      VAS [place] TRANSCRANIAL DOPPLER Referring Phys: Maralyn Sago GROCE --------------------------------------------------------------------------------  Indications: Subarachnoid hemorrhage. Limitations: Technically limited study due to patient frequent movement and              patient's position. Limitations for diagnostic windows: Unable to insonate right transtemporal window. Performing Technologist: Marilynne Halsted RDMS, RVT   Examination Guidelines: A complete evaluation includes B-mode imaging, spectral Doppler, color Doppler, and power Doppler as needed of all accessible portions of each vessel. Bilateral testing is considered an integral part of a complete examination. Limited examinations for reoccurring indications may be performed as noted.  +----------+---------------+----------+-----------+-------------+ RIGHT TCD Right VM (cm/s)Depth (cm)Pulsatility   Comment    +----------+---------------+----------+-----------+-------------+ MCA  PATIENT DETAILS Name: Yolanda [name] Age: 89 y.o. Sex: female Date of Birth: 1933-10-04 MRN: [id number]. Admitting Physician: Clydie Braun, MD PCP:Pcp, No  Admit Date: 07/06/2023 Discharge date: 07/10/2023  Recommendations for Outpatient Follow-up:  Follow up with PCP in 1-2 weeks Please obtain CMP/CBC in one week Please ensure follow up with Neurology  Admitted From:  Home  Disposition: Home health (family declined SNF)   Discharge Condition: good  CODE STATUS:   Code Status: Limited: Do not attempt resuscitation (DNR) -DNR-LIMITED -Do Not Intubate/DNI    Diet recommendation:  Diet Order             Diet - low sodium heart healthy           Diet full liquid Room service appropriate? Yes with Assist; Fluid consistency: Thin  Diet effective now                    Brief Summary: Patient is a 89 y.o.  female with recent history of SAH-s/p left MCA aneurysm coiling-just discharged home from [facility] on 9/13-presented to the hospital on 9/14 with aphasia/right-sided weakness-she was found to have acute CVA and subsequently admitted to the hospitalist service.   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 9/15>> Spot EEG: No seizures   Significant microbiology data: 9/14>> COVID/influenza/RSV PCR: Negative   Procedures: None   Consults: Neurology Neurosurgery  Brief Hospital Course: Acute CVA Either secondary to delayed vasospasm-or thrombotic in nature from underlying aneurysms with wide base  Neuroexam is gradually improved-she was initially aphasic-she is not able to say 1-2 words, right-sided  hemiparesis is better.   Workup as above No further recommendations from neurosurgery-no role for angiogram in this elderly patient. Neurology followed closely-recommendations are to discharge on aspirin/statin.  Please ensure outpatient follow-up with neurology.   Recent SAH-with call embolization of left MCA aneurysm CTA as above Appreciate neurosurgical input-continue to treat as ischemic stroke.   Dysphagia Likely secondary to acute CVA-superimposed on recent SAH Tolerating dysphagia 1 diet  SLP following close-Home health services including SLP ordered on discharge.   Acute metabolic encephalopathy likely combination of CVA-possible aspiration pneumonia Much awake and alert compared to how she was on admission.   Possible aspiration pneumonia/atelectasis Patchy airspace opacities noted on CTA-likely reflecting aspiration-patient asymptomatic-hold off on starting antibiotics at this point. SLP evaluation appreciate.   Chronic HFpEF Euvolemic   Hypokalemia Repleted.   HTN Permissive hypertension Bisoprolol initially held-will be resumed on discharge.   Mild intermittent asthma Bronchodilators   Migraine headaches As needed Tylenol for now Resume Topamax on discharge.   History of urinary retention Flomax was started-during recent hospitalization Resume Flomax-no evidence of urinary retention-had external female catheter during this hospitalization.   Palliative care/goals of care Multiple discussions with the family-DNR Plan was to allow for clinical outcomes-thankfully she has improved-with improving right-sided weakness-she is now able to order 1-2 words-and easily tolerating dysphagia 1 diet.   Initially when she first presented-hospice was contemplated-but given gradual improvement-family keen on taking her home and not to SNF.  Do not feel hospice is appropriate at this time given clinical improvement.     BMI: Estimated body mass index  not insonated +----------+--------------+----------+-----------+-------------+ Vertebral      -29                                         +----------+--------------+----------+-----------+-------------+ Distal ICA                                   not insonated +----------+--------------+----------+-----------+-------------+  +------------+-------+-------------+             VM cm/s   Comment    +------------+-------+-------------+ Prox Basilar       not insonated +------------+-------+-------------+ Summary:  Absent right temporal and poor suboccipital window limits evaluation.Normal mean flow velocities in identified vessels of anterior and posterior cerebral circulations without evidence of definite vasospasm noted. *See table(s) above for TCD measurements and observations.  Diagnosing physician: Delia Heady MD Electronically signed by Delia Heady MD on 06/18/2023 at 5:28:18 PM.    Final    VAS [place] TRANSCRANIAL DOPPLER  Result Date: 06/18/2023  Transcranial Doppler Patient Name:  Yolanda [name]  Date of Exam:   06/14/2023 Medical Rec #: [id number]     Accession #:    [id number] Date of Birth: 02-16-1933     Patient Gender: F Patient Age:   61 years Exam Location:  [facility] Procedure:      VAS [place] TRANSCRANIAL DOPPLER Referring Phys: Maralyn Sago GROCE --------------------------------------------------------------------------------  Indications: Subarachnoid hemorrhage.  Limitations for diagnostic windows: Unable to insonate occipital window. Comparison Study: 06/12/23 Performing Technologist: Shona Simpson  Examination Guidelines: A complete evaluation includes B-mode imaging, spectral Doppler, color Doppler, and power Doppler as needed of all accessible portions of each vessel. Bilateral testing is considered an integral part of a complete examination. Limited examinations for reoccurring indications may be performed as noted.  +----------+---------------+----------+-----------+-------+ RIGHT TCD Right VM (cm/s)Depth (cm)PulsatilityComment +----------+---------------+----------+-----------+-------+ MCA             74                     1.8            +----------+---------------+----------+-----------+-------+ ACA             48                     2.1            +----------+---------------+----------+-----------+-------+ Term ICA        45                     1.9            +----------+---------------+----------+-----------+-------+ PCA P1          24                     1.7            +----------+---------------+----------+-----------+-------+ Opthalmic       36                     1.9            +----------+---------------+----------+-----------+-------+ ICA siphon      26                     2.1            +----------+---------------+----------+-----------+-------+  PATIENT DETAILS Name: Yolanda [name] Age: 89 y.o. Sex: female Date of Birth: 1933-10-04 MRN: [id number]. Admitting Physician: Clydie Braun, MD PCP:Pcp, No  Admit Date: 07/06/2023 Discharge date: 07/10/2023  Recommendations for Outpatient Follow-up:  Follow up with PCP in 1-2 weeks Please obtain CMP/CBC in one week Please ensure follow up with Neurology  Admitted From:  Home  Disposition: Home health (family declined SNF)   Discharge Condition: good  CODE STATUS:   Code Status: Limited: Do not attempt resuscitation (DNR) -DNR-LIMITED -Do Not Intubate/DNI    Diet recommendation:  Diet Order             Diet - low sodium heart healthy           Diet full liquid Room service appropriate? Yes with Assist; Fluid consistency: Thin  Diet effective now                    Brief Summary: Patient is a 89 y.o.  female with recent history of SAH-s/p left MCA aneurysm coiling-just discharged home from [facility] on 9/13-presented to the hospital on 9/14 with aphasia/right-sided weakness-she was found to have acute CVA and subsequently admitted to the hospitalist service.   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 9/15>> Spot EEG: No seizures   Significant microbiology data: 9/14>> COVID/influenza/RSV PCR: Negative   Procedures: None   Consults: Neurology Neurosurgery  Brief Hospital Course: Acute CVA Either secondary to delayed vasospasm-or thrombotic in nature from underlying aneurysms with wide base  Neuroexam is gradually improved-she was initially aphasic-she is not able to say 1-2 words, right-sided  hemiparesis is better.   Workup as above No further recommendations from neurosurgery-no role for angiogram in this elderly patient. Neurology followed closely-recommendations are to discharge on aspirin/statin.  Please ensure outpatient follow-up with neurology.   Recent SAH-with call embolization of left MCA aneurysm CTA as above Appreciate neurosurgical input-continue to treat as ischemic stroke.   Dysphagia Likely secondary to acute CVA-superimposed on recent SAH Tolerating dysphagia 1 diet  SLP following close-Home health services including SLP ordered on discharge.   Acute metabolic encephalopathy likely combination of CVA-possible aspiration pneumonia Much awake and alert compared to how she was on admission.   Possible aspiration pneumonia/atelectasis Patchy airspace opacities noted on CTA-likely reflecting aspiration-patient asymptomatic-hold off on starting antibiotics at this point. SLP evaluation appreciate.   Chronic HFpEF Euvolemic   Hypokalemia Repleted.   HTN Permissive hypertension Bisoprolol initially held-will be resumed on discharge.   Mild intermittent asthma Bronchodilators   Migraine headaches As needed Tylenol for now Resume Topamax on discharge.   History of urinary retention Flomax was started-during recent hospitalization Resume Flomax-no evidence of urinary retention-had external female catheter during this hospitalization.   Palliative care/goals of care Multiple discussions with the family-DNR Plan was to allow for clinical outcomes-thankfully she has improved-with improving right-sided weakness-she is now able to order 1-2 words-and easily tolerating dysphagia 1 diet.   Initially when she first presented-hospice was contemplated-but given gradual improvement-family keen on taking her home and not to SNF.  Do not feel hospice is appropriate at this time given clinical improvement.     BMI: Estimated body mass index  Result Date: 07/06/2023 CLINICAL DATA:  Code stroke. Neuro deficit, acute, stroke suspected. Recent  subarachnoid hemorrhage treated with endovascular obliteration. EXAM: CT HEAD WITHOUT CONTRAST TECHNIQUE: Contiguous axial images were obtained from the base of the skull through the vertex without intravenous contrast. RADIATION DOSE REDUCTION: This exam was performed according to the departmental dose-optimization program which includes automated exposure control, adjustment of the mA and/or kV according to patient size and/or use of iterative reconstruction technique. COMPARISON:  CT head without contrast 07/03/2023 FINDINGS: Brain: Accounting for streak artifact in the left MCA territory, no acute infarct, hemorrhage or mass lesion is present. Mild atrophy and white matter changes are likely within normal limits for age. Deep brain nuclei are within normal limits. The ventricles are of normal size. No significant extraaxial fluid collection is present. Vascular: Coil mass is present in the left MCA bifurcation. No significant vascular calcifications are present. No hyperdense vessel is present. Skull: No significant extracranial soft tissue lesion is present. Calvarium is intact. No focal lytic or blastic lesions are present. Sinuses/Orbits: The paranasal sinuses and mastoid air cells are clear. The globes and orbits are within normal limits. ASPECTS [facility] Stroke Program Early CT Score) - Ganglionic level infarction (caudate, lentiform nuclei, internal capsule, insula, M1-M3 cortex): 7/7 - Supraganglionic infarction (M4-M6 cortex): 3/3 Total score (0-10 with 10 being normal): 10/10 IMPRESSION: 1. No acute intracranial abnormality. 2. Coil mass in the left MCA bifurcation. 3. Mild atrophy and white matter changes are likely within normal limits for age. 4. Aspects is 10/10. The above was relayed via text pager to Dr. Wilford Corner on 07/06/2023 at 14:33 . Electronically Signed   By: Marin Roberts M.D.   On: 07/06/2023 14:34   CT HEAD WO CONTRAST ( )  Result Date: 07/03/2023 CLINICAL DATA:  Headache,  increasing frequency or severity EXAM: CT HEAD WITHOUT CONTRAST TECHNIQUE: Contiguous axial images were obtained from the base of the skull through the vertex without intravenous contrast. RADIATION DOSE REDUCTION: This exam was performed according to the departmental dose-optimization program which includes automated exposure control, adjustment of the mA and/or kV according to patient size and/or use of iterative reconstruction technique. COMPARISON:  CT Head 06/12/23 FINDINGS: Brain: There is a likely small volume subarachnoid hemorrhage along the left cerebral convexity (series 3, image 23-25). No hydrocephalus. No extra-axial fluid collection. No evidence of intraventricular blood products. No CT evidence of an acute cortical infarct. Partially empty sella. 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 Skull: Normal. Negative for fracture or focal lesion. Sinuses/Orbits: 2 no middle ear or mastoid effusion. Paranasal sinuses are notable for polypoid mucosal thickening in the left maxillary sinus. Orbits are unremarkable. Other: None. IMPRESSION: 1. Small volume residual subarachnoid hemorrhage along the left cerebral convexity. This has decreased compared to 06/10/23 2. 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 Electronically Signed   By: Lorenza Cambridge M.D.   On: 07/03/2023 14:26   VAS [place] LOWER EXTREMITY VENOUS (DVT)  Result Date: 06/30/2023  Lower Venous DVT Study Patient Name:  Yolanda [name]  Date of Exam:   06/28/2023 Medical Rec #: [id number]     Accession #:    [id number] Date of Birth: April 03, 1933     Patient Gender: F Patient Age:   55 years Exam Location:  [facility] Procedure:      VAS [place] LOWER EXTREMITY VENOUS (DVT) Referring Phys: Wendi Maya --------------------------------------------------------------------------------  Indications: "edema". Other Indications: Rehab patient. Limitations: Difficulty imaging left lower extremity due  no headache,no chest abdominal pain,no new weakness tingling or numbness, feels much better wants to go home today.   Objective:   Blood pressure (!) 158/60, pulse 70, temperature 98.1 F (36.7 C), temperature source Oral, resp. rate 19, height 4\' 8"  (1.422 m), weight 56.6 kg, SpO2 96%.  Intake/Output Summary (Last 24 hours) at 07/10/2023 0857 Last data filed at 07/10/2023 0600 Gross per 24 hour  Intake --  Output 800 ml  Net -800 ml   Filed Weights   07/06/23 1424 07/09/23 0709  Weight: 54 kg 56.6 kg    Exam: Awake Alert, Oriented *3, No new F.N deficits, Normal affect [place].AT,PERRAL Supple Neck,No JVD, No cervical lymphadenopathy appriciated.  Symmetrical Chest wall movement, Good air movement bilaterally, CTAB RRR,No Gallops,Rubs or new Murmurs, No Parasternal Heave +ve B.Sounds, Abd Soft, Non tender, No organomegaly appriciated, No rebound -guarding or rigidity. No Cyanosis, Clubbing or edema, No new Rash or bruise   PERTINENT RADIOLOGIC STUDIES: DG Swallowing Func-Speech Pathology  Result Date: 07/08/2023 Table formatting from the original result was not included. Modified Barium Swallow Study Patient Details Name: Yolanda [name] MRN: [id number] Date of Birth: 1933-01-21 Today's Date: 07/08/2023 HPI/PMH: HPI: Yolanda [name] is an 89 yo female presenting to ED 9/14 with R sided weakness and speech difficulty. 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 Recently discharged from [facility] 9/13. Most recently seen by SLP 9/12 tolerating Dys 3 diet with thin liquids. 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 The study was limtied by pt's excessive movement and difficulty maintaining adequate positioning. Pt consistently initiating the swallow at the level of the pyriform sinuses across all boluses. Trials of thin liquids resulted in misitiming of laryngeal vestibule closure with trace, transient penetration from the pyriform sinuses as the swallow is initiated. No further penetration/aspiration noted with trials of nectar thick liquids or purees. Pt with reduced labial seal, likely due to R sided weakness and resulting in significant anterior spillage. When presented with trials of solids, pt made no attempt to masticate or initiate swallow given Max verbal and visual cueing. Suspect cognition is inhibitng her ability to safely initiate a regular texture solid diet at this time, but feel that she shows potential to progress as she d/c and is exposed to familiar foods with family support. Recommend initiating a diet of Dys 1 textured solids with thin liquids at this time. Meds should be given whole in puree. Discussed results of MBS and diet recommendation as well as potential to upgrade with her son, who was present for the entirety of the study. Will continue to follow. Factors that may increase risk of adverse event in presence of aspiration Yolanda [name] & Yolanda [name] 2021): Factors that may increase risk of adverse event in presence of aspiration Yolanda [name] & Yolanda [name] 2021): Poor general health and/or compromised immunity; Reduced cognitive function; Frail or deconditioned Recommendations/Plan: Swallowing Evaluation Recommendations Swallowing Evaluation Recommendations Recommendations: PO diet PO Diet Recommendation: Dysphagia 1 (Pureed); Thin liquids (Level 0) Liquid Administration via: Cup; Straw Medication Administration: Whole meds with puree Supervision: Staff to assist with self-feeding; Full supervision/cueing for  swallowing strategies Swallowing strategies  : Minimize environmental distractions; Slow rate; Small bites/sips Postural changes: Stay upright 30-60 min after meals Oral care recommendations: Oral care BID (2x/day) Treatment Plan Treatment Plan Treatment recommendations: Therapy as outlined in treatment plan  not insonated +----------+--------------+----------+-----------+-------------+ Vertebral      -29                                         +----------+--------------+----------+-----------+-------------+ Distal ICA                                   not insonated +----------+--------------+----------+-----------+-------------+  +------------+-------+-------------+             VM cm/s   Comment    +------------+-------+-------------+ Prox Basilar       not insonated +------------+-------+-------------+ Summary:  Absent right temporal and poor suboccipital window limits evaluation.Normal mean flow velocities in identified vessels of anterior and posterior cerebral circulations without evidence of definite vasospasm noted. *See table(s) above for TCD measurements and observations.  Diagnosing physician: Delia Heady MD Electronically signed by Delia Heady MD on 06/18/2023 at 5:28:18 PM.    Final    VAS [place] TRANSCRANIAL DOPPLER  Result Date: 06/18/2023  Transcranial Doppler Patient Name:  Yolanda [name]  Date of Exam:   06/14/2023 Medical Rec #: [id number]     Accession #:    [id number] Date of Birth: 02-16-1933     Patient Gender: F Patient Age:   61 years Exam Location:  [facility] Procedure:      VAS [place] TRANSCRANIAL DOPPLER Referring Phys: Maralyn Sago GROCE --------------------------------------------------------------------------------  Indications: Subarachnoid hemorrhage.  Limitations for diagnostic windows: Unable to insonate occipital window. Comparison Study: 06/12/23 Performing Technologist: Shona Simpson  Examination Guidelines: A complete evaluation includes B-mode imaging, spectral Doppler, color Doppler, and power Doppler as needed of all accessible portions of each vessel. Bilateral testing is considered an integral part of a complete examination. Limited examinations for reoccurring indications may be performed as noted.  +----------+---------------+----------+-----------+-------+ RIGHT TCD Right VM (cm/s)Depth (cm)PulsatilityComment +----------+---------------+----------+-----------+-------+ MCA             74                     1.8            +----------+---------------+----------+-----------+-------+ ACA             48                     2.1            +----------+---------------+----------+-----------+-------+ Term ICA        45                     1.9            +----------+---------------+----------+-----------+-------+ PCA P1          24                     1.7            +----------+---------------+----------+-----------+-------+ Opthalmic       36                     1.9            +----------+---------------+----------+-----------+-------+ ICA siphon      26                     2.1            +----------+---------------+----------+-----------+-------+  not insonated +----------+--------------+----------+-----------+-------------+ Term ICA        25                   1.54                  +----------+--------------+----------+-----------+-------------+ PCA P1          26                   1.57                  +----------+--------------+----------+-----------+-------------+ Opthalmic       16                   1.94                   +----------+--------------+----------+-----------+-------------+ ICA siphon      25                   1.69                  +----------+--------------+----------+-----------+-------------+ Vertebral      -15                   1.24                  +----------+--------------+----------+-----------+-------------+ Distal ICA     -19                   1.60                  +----------+--------------+----------+-----------+-------------+  +------------+-------+-------+             VM cm/sComment +------------+-------+-------+ Prox Basilar  -24   1.64   +------------+-------+-------+ Dist Basilar  -26   1.63   +------------+-------+-------+ +----------------------+----+ Right Lindegaard Ratio2.40 +----------------------+----+ +---------------------+----+ Left Lindegaard Ratio1.16 +---------------------+----+  Summary:  Low mean flow velocities in left middle cerebral artery suggestive of distal stenosis. Globally elevated pulsatility indices likely suggest diffuse intracranial atherosclerosis *See table(s) above for TCD measurements and observations.  Diagnosing physician: Delia Heady MD Electronically signed by Delia Heady MD on 06/20/2023 at 12:18:39 PM.    Final    VAS [place] TRANSCRANIAL DOPPLER  Result Date: 06/18/2023  Transcranial Doppler Patient Name:  Yolanda [name]  Date of Exam:   06/17/2023 Medical Rec #: [id number]     Accession #:    [id number] Date of Birth: 06/04/1933     Patient Gender: F Patient Age:   48 years Exam Location:  [facility] Procedure:      VAS [place] TRANSCRANIAL DOPPLER Referring Phys: Maralyn Sago GROCE --------------------------------------------------------------------------------  Indications: Subarachnoid hemorrhage. Limitations: Technically limited study due to patient frequent movement and              patient's position. Limitations for diagnostic windows: Unable to insonate right transtemporal window. Performing Technologist: Marilynne Halsted RDMS, RVT   Examination Guidelines: A complete evaluation includes B-mode imaging, spectral Doppler, color Doppler, and power Doppler as needed of all accessible portions of each vessel. Bilateral testing is considered an integral part of a complete examination. Limited examinations for reoccurring indications may be performed as noted.  +----------+---------------+----------+-----------+-------------+ RIGHT TCD Right VM (cm/s)Depth (cm)Pulsatility   Comment    +----------+---------------+----------+-----------+-------------+ MCA  these medications    acetaminophen 325 MG tablet Commonly known as: TYLENOL Take 2 tablets (650 mg total) by mouth 3 (three) times daily as needed.   aspirin 81 MG chewable tablet Chew 1 tablet (81 mg total) by mouth daily.   bisoprolol 5 MG tablet Commonly known as: ZEBETA Take 1 tablet (5 mg total) by mouth daily.   butalbital-acetaminophen-caffeine 50-325-40 MG tablet Commonly known as: FIORICET Take 1 tablet by mouth every 6  (six) hours as needed for headache.   gabapentin 100 MG capsule Commonly known as: NEURONTIN Take 1 capsule (100 mg total) by mouth 3 (three) times daily.   ramelteon 8 MG tablet Commonly known as: ROZEREM Take 1 tablet (8 mg total) by mouth at bedtime.   rosuvastatin 20 MG tablet Commonly known as: CRESTOR Take 1 tablet (20 mg total) by mouth daily.   sertraline 25 MG tablet Commonly known as: ZOLOFT Take 1 tablet (25 mg total) by mouth daily.   tamsulosin 0.4 MG Caps capsule Commonly known as: FLOMAX Take 1 capsule (0.4 mg total) by mouth daily after supper.   topiramate 100 MG tablet Commonly known as: TOPAMAX Take 1 tablet (100 mg total) by mouth at bedtime.   traZODone 100 MG tablet Commonly known as: DESYREL Take 1 tablet (100 mg total) by mouth at bedtime.   Vitamin D (Ergocalciferol) 1.25 MG (50000 UNIT) Caps capsule Commonly known as: DRISDOL Take 1 capsule (50,000 Units total) by mouth every 7 (seven) days. Take each Friday until finished.               Durable Medical Equipment  (From admission, onward)           Start     Ordered   07/09/23 1334  For home use only DME Hospital bed  Once       Comments: [employer] [place]  Question Answer Comment  Length of Need Lifetime   Patient has (list medical condition): intracranial hemmorhage   The above medical condition requires: Patient requires the ability to reposition frequently   Head must be elevated greater than: 30 degrees   Bed type Semi-electric      07/09/23 1339            Follow-up Information     Micki Riley, MD. Schedule an appointment as soon as possible for a visit in 1 month(s).   Specialties: Neurology, Radiology Why: stroke clinic Contact information: [address] [place] [address] [phone]                Allergies  Allergen Reactions   Meat Extract Other (See Comments)    Pt vegetarian      Other Procedures/Studies: DG  Swallowing Func-Speech Pathology  Result Date: 07/08/2023 Table formatting from the original result was not included. Modified Barium Swallow Study Patient Details Name: Yolanda [name] MRN: [id number] Date of Birth: Nov 07, 1932 Today's Date: 07/08/2023 HPI/PMH: HPI: Yolanda [name] is an 89 yo female presenting to ED 9/14 with R sided weakness and speech difficulty. 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 Recently discharged from [facility] 9/13. Most recently seen by SLP 9/12 tolerating Dys 3 diet with thin liquids. 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 The study was limtied by pt's excessive movement and difficulty maintaining adequate positioning. Pt consistently  not insonated +----------+--------------+----------+-----------+-------------+ Vertebral      -29                                         +----------+--------------+----------+-----------+-------------+ Distal ICA                                   not insonated +----------+--------------+----------+-----------+-------------+  +------------+-------+-------------+             VM cm/s   Comment    +------------+-------+-------------+ Prox Basilar       not insonated +------------+-------+-------------+ Summary:  Absent right temporal and poor suboccipital window limits evaluation.Normal mean flow velocities in identified vessels of anterior and posterior cerebral circulations without evidence of definite vasospasm noted. *See table(s) above for TCD measurements and observations.  Diagnosing physician: Delia Heady MD Electronically signed by Delia Heady MD on 06/18/2023 at 5:28:18 PM.    Final    VAS [place] TRANSCRANIAL DOPPLER  Result Date: 06/18/2023  Transcranial Doppler Patient Name:  Yolanda [name]  Date of Exam:   06/14/2023 Medical Rec #: [id number]     Accession #:    [id number] Date of Birth: 02-16-1933     Patient Gender: F Patient Age:   61 years Exam Location:  [facility] Procedure:      VAS [place] TRANSCRANIAL DOPPLER Referring Phys: Maralyn Sago GROCE --------------------------------------------------------------------------------  Indications: Subarachnoid hemorrhage.  Limitations for diagnostic windows: Unable to insonate occipital window. Comparison Study: 06/12/23 Performing Technologist: Shona Simpson  Examination Guidelines: A complete evaluation includes B-mode imaging, spectral Doppler, color Doppler, and power Doppler as needed of all accessible portions of each vessel. Bilateral testing is considered an integral part of a complete examination. Limited examinations for reoccurring indications may be performed as noted.  +----------+---------------+----------+-----------+-------+ RIGHT TCD Right VM (cm/s)Depth (cm)PulsatilityComment +----------+---------------+----------+-----------+-------+ MCA             74                     1.8            +----------+---------------+----------+-----------+-------+ ACA             48                     2.1            +----------+---------------+----------+-----------+-------+ Term ICA        45                     1.9            +----------+---------------+----------+-----------+-------+ PCA P1          24                     1.7            +----------+---------------+----------+-----------+-------+ Opthalmic       36                     1.9            +----------+---------------+----------+-----------+-------+ ICA siphon      26                     2.1            +----------+---------------+----------+-----------+-------+  Result Date: 06/20/2023  Transcranial Doppler Patient Name:  Yolanda [name]  Date of Exam:   06/19/2023 Medical Rec #: [id number]     Accession #:    [id number] Date of Birth: 01-Jul-1933     Patient Gender: F Patient Age:   50 years Exam Location:  [facility] Procedure:      VAS [place] TRANSCRANIAL DOPPLER Referring Phys: Maralyn Sago GROCE --------------------------------------------------------------------------------  Indications: Subarachnoid hemorrhage. Comparison Study: Previous exam 06/17/2023 Performing Technologist: Ernestene Mention RVT, RDMS  Examination Guidelines: A complete evaluation includes B-mode imaging, spectral Doppler, color Doppler, and power Doppler as needed of all accessible portions of each vessel. Bilateral testing is considered an integral part of a complete examination. Limited examinations for reoccurring indications may be performed as noted.  +----------+---------------+----------+-----------+-------------+ RIGHT TCD Right VM  (cm/s)Depth (cm)Pulsatility   Comment    +----------+---------------+----------+-----------+-------------+ MCA             48                    1.57                  +----------+---------------+----------+-----------+-------------+ ACA                                           not insonated +----------+---------------+----------+-----------+-------------+ Term ICA        27                    1.55                  +----------+---------------+----------+-----------+-------------+ PCA P1          21                    1.54                  +----------+---------------+----------+-----------+-------------+ Opthalmic       17                    2.00                  +----------+---------------+----------+-----------+-------------+ ICA siphon                                    not insonated +----------+---------------+----------+-----------+-------------+ Vertebral       -18                   1.47                  +----------+---------------+----------+-----------+-------------+ Distal ICA      -20                   1.74                  +----------+---------------+----------+-----------+-------------+  +----------+--------------+----------+-----------+-------------+ LEFT TCD  Left VM (cm/s)Depth (cm)Pulsatility   Comment    +----------+--------------+----------+-----------+-------------+ MCA             22                   1.54                  +----------+--------------+----------+-----------+-------------+ ACA  not insonated +----------+--------------+----------+-----------+-------------+ Term ICA        25                   1.54                  +----------+--------------+----------+-----------+-------------+ PCA P1          26                   1.57                  +----------+--------------+----------+-----------+-------------+ Opthalmic       16                   1.94                   +----------+--------------+----------+-----------+-------------+ ICA siphon      25                   1.69                  +----------+--------------+----------+-----------+-------------+ Vertebral      -15                   1.24                  +----------+--------------+----------+-----------+-------------+ Distal ICA     -19                   1.60                  +----------+--------------+----------+-----------+-------------+  +------------+-------+-------+             VM cm/sComment +------------+-------+-------+ Prox Basilar  -24   1.64   +------------+-------+-------+ Dist Basilar  -26   1.63   +------------+-------+-------+ +----------------------+----+ Right Lindegaard Ratio2.40 +----------------------+----+ +---------------------+----+ Left Lindegaard Ratio1.16 +---------------------+----+  Summary:  Low mean flow velocities in left middle cerebral artery suggestive of distal stenosis. Globally elevated pulsatility indices likely suggest diffuse intracranial atherosclerosis *See table(s) above for TCD measurements and observations.  Diagnosing physician: Delia Heady MD Electronically signed by Delia Heady MD on 06/20/2023 at 12:18:39 PM.    Final    VAS [place] TRANSCRANIAL DOPPLER  Result Date: 06/18/2023  Transcranial Doppler Patient Name:  Yolanda [name]  Date of Exam:   06/17/2023 Medical Rec #: [id number]     Accession #:    [id number] Date of Birth: 06/04/1933     Patient Gender: F Patient Age:   48 years Exam Location:  [facility] Procedure:      VAS [place] TRANSCRANIAL DOPPLER Referring Phys: Maralyn Sago GROCE --------------------------------------------------------------------------------  Indications: Subarachnoid hemorrhage. Limitations: Technically limited study due to patient frequent movement and              patient's position. Limitations for diagnostic windows: Unable to insonate right transtemporal window. Performing Technologist: Marilynne Halsted RDMS, RVT   Examination Guidelines: A complete evaluation includes B-mode imaging, spectral Doppler, color Doppler, and power Doppler as needed of all accessible portions of each vessel. Bilateral testing is considered an integral part of a complete examination. Limited examinations for reoccurring indications may be performed as noted.  +----------+---------------+----------+-----------+-------------+ RIGHT TCD Right VM (cm/s)Depth (cm)Pulsatility   Comment    +----------+---------------+----------+-----------+-------------+ MCA  Distal ICA      18                     1.6            +----------+---------------+----------+-----------+-------+  +----------+--------------+----------+-----------+-------+ LEFT TCD  Left VM (cm/s)Depth (cm)PulsatilityComment +----------+--------------+----------+-----------+-------+ MCA             32                    2.3            +----------+--------------+----------+-----------+-------+ ACA             70                                   +----------+--------------+----------+-----------+-------+ Term ICA        39                    2.0             +----------+--------------+----------+-----------+-------+ PCA P1          25                    1.8            +----------+--------------+----------+-----------+-------+ Opthalmic       24                    1.9            +----------+--------------+----------+-----------+-------+ ICA siphon      47                    1.6            +----------+--------------+----------+-----------+-------+ Distal ICA      19                    1.6            +----------+--------------+----------+-----------+-------+  +----------------------+---+ Right Lindegaard Ratio4.1 +----------------------+---+ +---------------------+----+ Left Lindegaard Ratio1.68 +---------------------+----+  Summary:  Mildly elevated right middle cerebral and left anterior cerebral artery mean flow velocitie sof unclear significance. globally elevate dpulsatility indices suggest diffuse increase in intracranial pressure versus increased intracranial atherosclerosis *See table(s) above for TCD measurements and observations.  Diagnosing physician: Delia Heady MD Electronically signed by Delia Heady MD on 06/18/2023 at 5:26:39 PM.    Final    VAS [place] TRANSCRANIAL DOPPLER  Result Date: 06/18/2023  Transcranial Doppler Patient Name:  Yolanda [name]  Date of Exam:   06/12/2023 Medical Rec #: [id number]     Accession #:    [id number] Date of Birth: Aug 07, 1933     Patient Gender: F Patient Age:   27 years Exam Location:  [facility] Procedure:      VAS [place] TRANSCRANIAL DOPPLER Referring Phys: Maralyn Sago GROCE --------------------------------------------------------------------------------  Indications: Subarachnoid hemorrhage. Limitations for diagnostic windows: Unable to insonate occipital window. Comparison Study: No prior study Performing Technologist: Shona Simpson  Examination Guidelines: A complete evaluation includes B-mode imaging, spectral Doppler, color Doppler, and power Doppler as needed of all accessible  portions of each vessel. Bilateral testing is considered an integral part of a complete examination. Limited examinations for reoccurring indications may be performed as noted.  +----------+---------------+----------+-----------+-------+ RIGHT TCD Right VM (cm/s)Depth (cm)PulsatilityComment +----------+---------------+----------+-----------+-------+ MCA             57  not insonated +----------+--------------+----------+-----------+-------------+ Vertebral      -29                                         +----------+--------------+----------+-----------+-------------+ Distal ICA                                   not insonated +----------+--------------+----------+-----------+-------------+  +------------+-------+-------------+             VM cm/s   Comment    +------------+-------+-------------+ Prox Basilar       not insonated +------------+-------+-------------+ Summary:  Absent right temporal and poor suboccipital window limits evaluation.Normal mean flow velocities in identified vessels of anterior and posterior cerebral circulations without evidence of definite vasospasm noted. *See table(s) above for TCD measurements and observations.  Diagnosing physician: Delia Heady MD Electronically signed by Delia Heady MD on 06/18/2023 at 5:28:18 PM.    Final    VAS [place] TRANSCRANIAL DOPPLER  Result Date: 06/18/2023  Transcranial Doppler Patient Name:  Yolanda [name]  Date of Exam:   06/14/2023 Medical Rec #: [id number]     Accession #:    [id number] Date of Birth: 02-16-1933     Patient Gender: F Patient Age:   61 years Exam Location:  [facility] Procedure:      VAS [place] TRANSCRANIAL DOPPLER Referring Phys: Maralyn Sago GROCE --------------------------------------------------------------------------------  Indications: Subarachnoid hemorrhage.  Limitations for diagnostic windows: Unable to insonate occipital window. Comparison Study: 06/12/23 Performing Technologist: Shona Simpson  Examination Guidelines: A complete evaluation includes B-mode imaging, spectral Doppler, color Doppler, and power Doppler as needed of all accessible portions of each vessel. Bilateral testing is considered an integral part of a complete examination. Limited examinations for reoccurring indications may be performed as noted.  +----------+---------------+----------+-----------+-------+ RIGHT TCD Right VM (cm/s)Depth (cm)PulsatilityComment +----------+---------------+----------+-----------+-------+ MCA             74                     1.8            +----------+---------------+----------+-----------+-------+ ACA             48                     2.1            +----------+---------------+----------+-----------+-------+ Term ICA        45                     1.9            +----------+---------------+----------+-----------+-------+ PCA P1          24                     1.7            +----------+---------------+----------+-----------+-------+ Opthalmic       36                     1.9            +----------+---------------+----------+-----------+-------+ ICA siphon      26                     2.1            +----------+---------------+----------+-----------+-------+  Result Date: 07/06/2023 CLINICAL DATA:  Code stroke. Neuro deficit, acute, stroke suspected. Recent  subarachnoid hemorrhage treated with endovascular obliteration. EXAM: CT HEAD WITHOUT CONTRAST TECHNIQUE: Contiguous axial images were obtained from the base of the skull through the vertex without intravenous contrast. RADIATION DOSE REDUCTION: This exam was performed according to the departmental dose-optimization program which includes automated exposure control, adjustment of the mA and/or kV according to patient size and/or use of iterative reconstruction technique. COMPARISON:  CT head without contrast 07/03/2023 FINDINGS: Brain: Accounting for streak artifact in the left MCA territory, no acute infarct, hemorrhage or mass lesion is present. Mild atrophy and white matter changes are likely within normal limits for age. Deep brain nuclei are within normal limits. The ventricles are of normal size. No significant extraaxial fluid collection is present. Vascular: Coil mass is present in the left MCA bifurcation. No significant vascular calcifications are present. No hyperdense vessel is present. Skull: No significant extracranial soft tissue lesion is present. Calvarium is intact. No focal lytic or blastic lesions are present. Sinuses/Orbits: The paranasal sinuses and mastoid air cells are clear. The globes and orbits are within normal limits. ASPECTS [facility] Stroke Program Early CT Score) - Ganglionic level infarction (caudate, lentiform nuclei, internal capsule, insula, M1-M3 cortex): 7/7 - Supraganglionic infarction (M4-M6 cortex): 3/3 Total score (0-10 with 10 being normal): 10/10 IMPRESSION: 1. No acute intracranial abnormality. 2. Coil mass in the left MCA bifurcation. 3. Mild atrophy and white matter changes are likely within normal limits for age. 4. Aspects is 10/10. The above was relayed via text pager to Dr. Wilford Corner on 07/06/2023 at 14:33 . Electronically Signed   By: Marin Roberts M.D.   On: 07/06/2023 14:34   CT HEAD WO CONTRAST ( )  Result Date: 07/03/2023 CLINICAL DATA:  Headache,  increasing frequency or severity EXAM: CT HEAD WITHOUT CONTRAST TECHNIQUE: Contiguous axial images were obtained from the base of the skull through the vertex without intravenous contrast. RADIATION DOSE REDUCTION: This exam was performed according to the departmental dose-optimization program which includes automated exposure control, adjustment of the mA and/or kV according to patient size and/or use of iterative reconstruction technique. COMPARISON:  CT Head 06/12/23 FINDINGS: Brain: There is a likely small volume subarachnoid hemorrhage along the left cerebral convexity (series 3, image 23-25). No hydrocephalus. No extra-axial fluid collection. No evidence of intraventricular blood products. No CT evidence of an acute cortical infarct. Partially empty sella. 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 Skull: Normal. Negative for fracture or focal lesion. Sinuses/Orbits: 2 no middle ear or mastoid effusion. Paranasal sinuses are notable for polypoid mucosal thickening in the left maxillary sinus. Orbits are unremarkable. Other: None. IMPRESSION: 1. Small volume residual subarachnoid hemorrhage along the left cerebral convexity. This has decreased compared to 06/10/23 2. 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 Electronically Signed   By: Lorenza Cambridge M.D.   On: 07/03/2023 14:26   VAS [place] LOWER EXTREMITY VENOUS (DVT)  Result Date: 06/30/2023  Lower Venous DVT Study Patient Name:  Yolanda [name]  Date of Exam:   06/28/2023 Medical Rec #: [id number]     Accession #:    [id number] Date of Birth: April 03, 1933     Patient Gender: F Patient Age:   55 years Exam Location:  [facility] Procedure:      VAS [place] LOWER EXTREMITY VENOUS (DVT) Referring Phys: Wendi Maya --------------------------------------------------------------------------------  Indications: "edema". Other Indications: Rehab patient. Limitations: Difficulty imaging left lower extremity due  PATIENT DETAILS Name: Yolanda [name] Age: 89 y.o. Sex: female Date of Birth: 1933-10-04 MRN: [id number]. Admitting Physician: Clydie Braun, MD PCP:Pcp, No  Admit Date: 07/06/2023 Discharge date: 07/10/2023  Recommendations for Outpatient Follow-up:  Follow up with PCP in 1-2 weeks Please obtain CMP/CBC in one week Please ensure follow up with Neurology  Admitted From:  Home  Disposition: Home health (family declined SNF)   Discharge Condition: good  CODE STATUS:   Code Status: Limited: Do not attempt resuscitation (DNR) -DNR-LIMITED -Do Not Intubate/DNI    Diet recommendation:  Diet Order             Diet - low sodium heart healthy           Diet full liquid Room service appropriate? Yes with Assist; Fluid consistency: Thin  Diet effective now                    Brief Summary: Patient is a 89 y.o.  female with recent history of SAH-s/p left MCA aneurysm coiling-just discharged home from CIR on 9/13-presented to the hospital on 9/14 with aphasia/right-sided weakness-she was found to have acute CVA and subsequently admitted to the hospitalist service.   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 9/15>> Spot EEG: No seizures   Significant microbiology data: 9/14>> COVID/influenza/RSV PCR: Negative   Procedures: None   Consults: Neurology Neurosurgery  Brief Hospital Course: Acute CVA Either secondary to delayed vasospasm-or thrombotic in nature from underlying aneurysms with wide base  Neuroexam is gradually improved-she was initially aphasic-she is not able to say 1-2 words, right-sided  hemiparesis is better.   Workup as above No further recommendations from neurosurgery-no role for angiogram in this elderly patient. Neurology followed closely-recommendations are to discharge on aspirin/statin.  Please ensure outpatient follow-up with neurology.   Recent SAH-with call embolization of left MCA aneurysm CTA as above Appreciate neurosurgical input-continue to treat as ischemic stroke.   Dysphagia Likely secondary to acute CVA-superimposed on recent SAH Tolerating dysphagia 1 diet  SLP following close-Home health services including SLP ordered on discharge.   Acute metabolic encephalopathy likely combination of CVA-possible aspiration pneumonia Much awake and alert compared to how she was on admission.   Possible aspiration pneumonia/atelectasis Patchy airspace opacities noted on CTA-likely reflecting aspiration-patient asymptomatic-hold off on starting antibiotics at this point. SLP evaluation appreciate.   Chronic HFpEF Euvolemic   Hypokalemia Repleted.   HTN Permissive hypertension Bisoprolol initially held-will be resumed on discharge.   Mild intermittent asthma Bronchodilators   Migraine headaches As needed Tylenol for now Resume Topamax on discharge.   History of urinary retention Flomax was started-during recent hospitalization Resume Flomax-no evidence of urinary retention-had external female catheter during this hospitalization.   Palliative care/goals of care Multiple discussions with the family-DNR Plan was to allow for clinical outcomes-thankfully she has improved-with improving right-sided weakness-she is now able to order 1-2 words-and easily tolerating dysphagia 1 diet.   Initially when she first presented-hospice was contemplated-but given gradual improvement-family keen on taking her home and not to SNF.  Do not feel hospice is appropriate at this time given clinical improvement.     BMI: Estimated body mass index  not insonated +----------+--------------+----------+-----------+-------------+ Term ICA        25                   1.54                  +----------+--------------+----------+-----------+-------------+ PCA P1          26                   1.57                  +----------+--------------+----------+-----------+-------------+ Opthalmic       16                   1.94                   +----------+--------------+----------+-----------+-------------+ ICA siphon      25                   1.69                  +----------+--------------+----------+-----------+-------------+ Vertebral      -15                   1.24                  +----------+--------------+----------+-----------+-------------+ Distal ICA     -19                   1.60                  +----------+--------------+----------+-----------+-------------+  +------------+-------+-------+             VM cm/sComment +------------+-------+-------+ Prox Basilar  -24   1.64   +------------+-------+-------+ Dist Basilar  -26   1.63   +------------+-------+-------+ +----------------------+----+ Right Lindegaard Ratio2.40 +----------------------+----+ +---------------------+----+ Left Lindegaard Ratio1.16 +---------------------+----+  Summary:  Low mean flow velocities in left middle cerebral artery suggestive of distal stenosis. Globally elevated pulsatility indices likely suggest diffuse intracranial atherosclerosis *See table(s) above for TCD measurements and observations.  Diagnosing physician: Delia Heady MD Electronically signed by Delia Heady MD on 06/20/2023 at 12:18:39 PM.    Final    VAS [place] TRANSCRANIAL DOPPLER  Result Date: 06/18/2023  Transcranial Doppler Patient Name:  Yolanda [name]  Date of Exam:   06/17/2023 Medical Rec #: [id number]     Accession #:    [id number] Date of Birth: 06/04/1933     Patient Gender: F Patient Age:   48 years Exam Location:  [facility] Procedure:      VAS [place] TRANSCRANIAL DOPPLER Referring Phys: Maralyn Sago GROCE --------------------------------------------------------------------------------  Indications: Subarachnoid hemorrhage. Limitations: Technically limited study due to patient frequent movement and              patient's position. Limitations for diagnostic windows: Unable to insonate right transtemporal window. Performing Technologist: Marilynne Halsted RDMS, RVT   Examination Guidelines: A complete evaluation includes B-mode imaging, spectral Doppler, color Doppler, and power Doppler as needed of all accessible portions of each vessel. Bilateral testing is considered an integral part of a complete examination. Limited examinations for reoccurring indications may be performed as noted.  +----------+---------------+----------+-----------+-------------+ RIGHT TCD Right VM (cm/s)Depth (cm)Pulsatility   Comment    +----------+---------------+----------+-----------+-------------+ MCA

## 2023-07-10 NOTE — Progress Notes (Signed)
Occupational Therapy Treatment Patient Details Name: Yolanda [name] MRN: [id number] DOB: Mar 02, 1933 Today's Date: 07/10/2023   History of present illness Pt is an 89 y.o. Nepali speaking female who presented 07/06/23 with AMS (drooling out of right side of mouth, not responding, eyes rolling, unable to speak or recognize family. 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 PMH: dementia, asthma, OA, HTN, 06/10/23 subarachnoid hemorrhage more prominent in her left sylvian fissure.  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 S/p coil embolization of L MCA aneurysm.D/C 9/5 to rehab, D/C home 9/13.   OT comments  Patient seen by skilled OT to address family education addressing grooming, self feeding, bed mobility, and RUE ROM. Patient able to perform AAROM for RUE shoulder and elbow with gravity eliminated for elbow flexion and extension and education to perform. Patient able to perform self feeding with min assist for loading utensil and wash face with HOH to initiate. Patient is expected to discharge to home with [facility] per family request to further address family education and increasing mobility.       If plan is discharge home, recommend the following:  Two people to help with walking and/or transfers;Two people to help with bathing/dressing/bathroom;Assistance with cooking/housework;Assistance with feeding;Help with stairs or ramp for entrance;Assist for transportation;Direct supervision/assist for financial management;Direct supervision/assist for medications management   Equipment Recommendations  Hoyer lift;Hospital bed    Recommendations for Other Services      Precautions / Restrictions Precautions Precautions: Fall Precaution Comments: SBP goal <160; R Pusher Syndrome Restrictions Weight Bearing Restrictions: No       Mobility Bed  Mobility Overal bed mobility: Needs Assistance Bed Mobility: Rolling Rolling: Max assist         General bed mobility comments: rolled to right with mod assist and max assist to left    Transfers Overall transfer level: Needs assistance                 General transfer comment: patient seen at bed level     Balance                                           ADL either performed or assessed with clinical judgement   ADL Overall ADL's : Needs assistance/impaired Eating/Feeding: Minimal assistance;Bed level Eating/Feeding Details (indicate cue type and reason): HOB elevated and min assist to load utensil Grooming: Wash/dry face;Minimal assistance Grooming Details (indicate cue type and reason): HOH to initiate                               General ADL Comments: family education on allowing patient to assist with self feeding and grooming    Extremity/Trunk Assessment              Vision       Perception     Praxis      Cognition Arousal: Alert Behavior During Therapy: Flat affect Overall Cognitive Status: Difficult to assess (history of impairment at baseline) Area of Impairment: Attention, Following commands, Safety/judgement, Awareness, Problem solving                 Orientation Level: Disoriented to, Place, Time, Situation Current Attention Level: Focused Memory: Decreased  short-term memory Following Commands: Follows one step commands with increased time, Follows one step commands inconsistently Safety/Judgement: Decreased awareness of safety, Decreased awareness of deficits Awareness: Intellectual Problem Solving: Slow processing, Difficulty sequencing, Requires verbal cues, Requires tactile cues, Decreased initiation General Comments: frequent cues for follow directions        Exercises Exercises: General Upper Extremity General Exercises - Upper Extremity Shoulder Flexion: PROM, AAROM, Right, 10  reps Shoulder Extension: PROM, AAROM, Right Shoulder ABduction: PROM, Right, 10 reps Elbow Flexion: AAROM, PROM, Right, 10 reps (gravity elimanted) Elbow Extension: PROM, AAROM, Right (gravity elimanated) Wrist Flexion: PROM, Right, 10 reps Wrist Extension: PROM, Right, 10 reps    Shoulder Instructions       General Comments family education provided for rolling, self feeding, grooming, and LUE ROM    Pertinent Vitals/ Pain       Pain Assessment Pain Assessment: Faces Faces Pain Scale: No hurt Pain Intervention(s): Limited activity within patient's tolerance  Home Living                                          Prior Functioning/Environment              Frequency  Min 1X/week        Progress Toward Goals  OT Goals(current goals can now be found in the care plan section)  Progress towards OT goals: Progressing toward goals  Acute Rehab OT Goals OT Goal Formulation: Patient unable to participate in goal setting Time For Goal Achievement: 07/21/23 Potential to Achieve Goals: Fair ADL Goals Pt Will Perform Grooming: with supervision;with set-up;bed level Pt Will Perform Upper Body Bathing: with min assist;sitting Pt Will Transfer to Toilet: with mod assist;squat pivot transfer;bedside commode Pt/caregiver will Perform Home Exercise Program: Increased strength;Right Upper extremity;With written HEP provided Additional ADL Goal #1: Family will be aware of how to A pt with rolling in bed in order to be able to care for her at a bed level Additional ADL Goal #2: Pt will follow 2 out of 5 one step commands  Plan      Co-evaluation                 AM-PAC OT "6 Clicks" Daily Activity     Outcome Measure   Help from another person eating meals?: A Lot Help from another person taking care of personal grooming?: A Lot Help from another person toileting, which includes using toliet, bedpan, or urinal?: Total Help from another person bathing  (including washing, rinsing, drying)?: Total Help from another person to put on and taking off regular upper body clothing?: Total Help from another person to put on and taking off regular lower body clothing?: Total 6 Click Score: 8    End of Session    OT Visit Diagnosis: Unsteadiness on feet (R26.81);Other abnormalities of gait and mobility (R26.89);Muscle weakness (generalized) (M62.81);Low vision, both eyes (H54.2);Other symptoms and signs involving cognitive function;Cognitive communication deficit (R41.841);Hemiplegia and hemiparesis;Pain Symptoms and signs involving cognitive functions: Cerebral infarction Hemiplegia - Right/Left: Right Hemiplegia - dominant/non-dominant: Dominant Hemiplegia - caused by: Cerebral infarction   Activity Tolerance Patient tolerated treatment well   Patient Left in bed;with call bell/phone within reach;with bed alarm set;with family/visitor present   Nurse Communication Mobility status        Time: 1478-2956 OT Time Calculation (min): 30 min  Charges: OT General Charges $OT Visit: 1  Visit OT Treatments $Self Care/Home Management : 8-22 mins $Therapeutic Activity: 8-22 mins  Alfonse Flavors, OTA Acute Rehabilitation Services  Office [phone]   Dewain Penning 07/10/2023, 10:12 AM

## 2023-07-10 NOTE — Plan of Care (Signed)
  Problem: Acute Rehab OT Goals (only OT should resolve) Goal: Pt. Will Perform Grooming Outcome: Adequate for Discharge Goal: Pt/Caregiver Will Perform Home Exercise Program Outcome: Adequate for Discharge Goal: OT Additional ADL Goal #1 Outcome: Adequate for Discharge Goal: OT Additional ADL Goal #2 Outcome: Adequate for Discharge Goal: OT Additional ADL Goal #3 Outcome: Adequate for Discharge   Problem: Acute Rehab PT Goals(only PT should resolve) Goal: Pt will Roll Supine to Side Outcome: Adequate for Discharge Goal: Pt Will Go Sit To Supine/Side Outcome: Adequate for Discharge Goal: Patient Will Perform Sitting Balance Outcome: Adequate for Discharge Goal: Patient Will Transfer Sit To/From Stand Outcome: Adequate for Discharge Goal: Pt Will Transfer Bed To Chair/Chair To Bed Outcome: Adequate for Discharge Goal: Pt Will Perform Standing Balance Or Pre-Gait Outcome: Adequate for Discharge   Problem: Education: Goal: Knowledge of disease or condition will improve Outcome: Adequate for Discharge Goal: Knowledge of secondary prevention will improve (MUST DOCUMENT ALL) Outcome: Adequate for Discharge Goal: Knowledge of patient specific risk factors will improve Loraine Leriche N/A or DELETE if not current risk factor) Outcome: Adequate for Discharge   Problem: Spontaneous Subarachnoid Hemorrhage Tissue Perfusion: Goal: Complications of Spontaneous Subarachnoid Hemorrhage will be minimized Outcome: Adequate for Discharge   Problem: Coping: Goal: Will verbalize positive feelings about self Outcome: Adequate for Discharge Goal: Will identify appropriate support needs Outcome: Adequate for Discharge   Problem: Health Behavior/Discharge Planning: Goal: Ability to manage health-related needs will improve Outcome: Adequate for Discharge Goal: Goals will be collaboratively established with patient/family Outcome: Adequate for Discharge   Problem: Self-Care: Goal: Ability to  participate in self-care as condition permits will improve Outcome: Adequate for Discharge Goal: Verbalization of feelings and concerns over difficulty with self-care will improve Outcome: Adequate for Discharge Goal: Ability to communicate needs accurately will improve Outcome: Adequate for Discharge   Problem: Nutrition: Goal: Risk of aspiration will decrease Outcome: Adequate for Discharge Goal: Dietary intake will improve Outcome: Adequate for Discharge

## 2023-07-10 NOTE — Plan of Care (Signed)
Problem: Education: Goal: Knowledge of disease or condition will improve Outcome: Progressing   Problem: Self-Care: Goal: Ability to communicate needs accurately will improve Outcome: Progressing   Problem: Nutrition: Goal: Dietary intake will improve Outcome: Progressing

## 2023-07-11 ENCOUNTER — Ambulatory Visit: Payer: Medicare Other | Admitting: Student

## 2023-08-23 DIAGNOSIS — I69051 Hemiplegia and hemiparesis following nontraumatic subarachnoid hemorrhage affecting right dominant side: Secondary | ICD-10-CM | POA: Diagnosis not present

## 2023-08-23 DIAGNOSIS — F05 Delirium due to known physiological condition: Secondary | ICD-10-CM

## 2023-08-23 DIAGNOSIS — G9341 Metabolic encephalopathy: Secondary | ICD-10-CM

## 2023-08-23 DIAGNOSIS — I69091 Dysphagia following nontraumatic subarachnoid hemorrhage: Secondary | ICD-10-CM | POA: Diagnosis not present

## 2023-08-23 DIAGNOSIS — J4520 Mild intermittent asthma, uncomplicated: Secondary | ICD-10-CM

## 2023-08-23 DIAGNOSIS — I69020 Aphasia following nontraumatic subarachnoid hemorrhage: Secondary | ICD-10-CM | POA: Diagnosis not present

## 2023-08-23 DIAGNOSIS — I110 Hypertensive heart disease with heart failure: Secondary | ICD-10-CM

## 2023-08-23 DIAGNOSIS — G319 Degenerative disease of nervous system, unspecified: Secondary | ICD-10-CM

## 2023-08-23 DIAGNOSIS — G43909 Migraine, unspecified, not intractable, without status migrainosus: Secondary | ICD-10-CM

## 2023-08-23 DIAGNOSIS — F02818 Dementia in other diseases classified elsewhere, unspecified severity, with other behavioral disturbance: Secondary | ICD-10-CM

## 2023-08-23 DIAGNOSIS — R1311 Dysphagia, oral phase: Secondary | ICD-10-CM | POA: Diagnosis not present

## 2023-08-23 DIAGNOSIS — I5033 Acute on chronic diastolic (congestive) heart failure: Secondary | ICD-10-CM

## 2023-09-03 ENCOUNTER — Encounter: Payer: Self-pay | Admitting: Neurology

## 2023-09-03 ENCOUNTER — Ambulatory Visit (INDEPENDENT_AMBULATORY_CARE_PROVIDER_SITE_OTHER): Payer: Medicare Other | Admitting: Neurology

## 2023-09-03 VITALS — BP 121/74 | HR 81 | Ht <= 58 in | Wt 114.0 lb

## 2023-09-03 DIAGNOSIS — I69359 Hemiplegia and hemiparesis following cerebral infarction affecting unspecified side: Secondary | ICD-10-CM

## 2023-09-03 DIAGNOSIS — I6602 Occlusion and stenosis of left middle cerebral artery: Secondary | ICD-10-CM

## 2023-09-03 DIAGNOSIS — I69320 Aphasia following cerebral infarction: Secondary | ICD-10-CM

## 2023-09-03 MED ORDER — SERTRALINE HCL 25 MG PO TABS: 25.0000 mg | ORAL_TABLET | Freq: Every day | ORAL | 0 refills | Status: AC

## 2023-09-03 NOTE — Patient Instructions (Signed)
I had a long d/w patient and son about her recent stroke, subarachnoid hemorrhage and aneurysm embolization and residual aphasia and right hemiparesis risk for recurrent stroke/TIAs, personally independently reviewed imaging studies and stroke evaluation results and answered questions.Continue aspirin 81 mg daily  for secondary stroke prevention and maintain strict control of hypertension with blood pressure goal below 130/90, diabetes with hemoglobin A1c goal below 6.5% and lipids with LDL cholesterol goal below 70 mg/dL. I also advised the patient to eat a healthy diet with plenty of whole grains, cereals, fruits and vegetables, exercise regularly and maintain ideal body weight .recommend outpatient speech therapy for language and aphasia.  Follow-up with Dr. Conchita Paris for aneurysm follow-up.  Followup in the future with my nurse practitioner in 6 months or call earlier if necessary.

## 2023-09-03 NOTE — Progress Notes (Signed)
[facility] [address] [place]. [place] [address] [phone]       OFFICE CONSULT NOTE  [name] Date of Birth:  Aug 15, 1933 Medical Record Number:  [id number]   Referring MD: Ghimire  Reason for Referral: Stroke  HPI: Yolanda [name] is a 89 year old [place] lady seen today for initial office consultation visit for stroke.  She is accompanied by her son who translates and provides most of the history.  I have also reviewed electronic medical records and pertinent available imaging films in PACS.  She has past medical history of dementia and osteoarthritis.  She initially presented on 05/31/2023 and was fine the night prior to admission.  She woke up the next day with a splitting headache right facial droop.  She started throwing up and EMS were called.  Systolic blood pressure was in the 180s.  She was brought in as a code stroke.  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  Neurosurgery was consulted and patient was admitted to the neurological ICU on the critical care service.  Patient underwent endovascular coiling of the left MCA aneurysm by Dr. Conchita Paris. CT repeat stable SAH. LDL 132, A1c 5.8. 2D echo EF 70 to 75%.  Transcranial Doppler studies did not show any vasospasm.  She was eventually extubated and developed stridor requiring reintubation.  He gradually improved.  Family did not want PEG tube and declined palliative care patient made gradual improvement.  Hospital course of complicated by mild aspiration pneumonia which was treated with Unasyn.  She was transferred to inpatient rehab where she spent 4 weeks and was discharged home.  She presented the next day on 07/06/2023 with agitated delirium along with right-sided weakness and increased drooling.  CT angiogram showed successful coiling of the aneurysm and  CT scan showed acute to subacute left frontal operculum and basal ganglia infarct.  EEG was negative for seizures.  Etiology of left MCA hypodensity was felt to be related to vasospasm from the recent subarachnoid hemorrhage.  MRI scan confirmed acute to subacute left frontal operculum and basal ganglia infarct.  Cerebral catheter angiogram was suggested to look for vasospasm which may need endovascular treatment but family declined and did not want any aggressive or interventional treatment.  Patient was started on aspirin for stroke prevention..  Patient was eventually discharged home.  She had home physical and occupational speech therapy which has since discontinued.  She continues to have significant trouble expressing herself and speaking and understanding also seems to be impaired to some degree but she also has baseline dementia.  Right-sided strength is improving and she is able to ambulate now slowly independently.  She still has limited use of her right hand which is still weak.  She does have excessive drooling at times on the right side as well.  She is tolerating aspirin well without any side effects and Crestor as well.  The family has been reluctant to consider outpatient therapies due to logistical issues of transportation.  ROS:   14 system review of systems is positive for aphasia, speech difficulty, weakness, difficulty understanding, difficulty walking all other systems negative  PMH:  Past Medical History:  Diagnosis Date   Closed fracture of left proximal humerus 03/27/2021   Closed fracture of lower end of left radius with routine healing 10/31/2020   Dementia (HCC)    Mild intermittent asthma  without complication 04/04/2019   OSTEOARTHRITIS 08/04/2010   Annotation: Bilateral knees and ankles Qualifier: Diagnosis of  By: Delrae Alfred MD, Lanora Manis      Social History:  Social History   Socioeconomic History   Marital status: Married    Spouse name: Not on file   Number of  children: Not on file   Years of education: Not on file   Highest education level: Not on file  Occupational History   Not on file  Tobacco Use   Smoking status: Never   Smokeless tobacco: Not on file  Substance and Sexual Activity   Alcohol use: No   Drug use: No   Sexual activity: Not on file  Other Topics Concern   Not on file  Social History Narrative   Not on file   Social Determinants of Health   Financial Resource Strain: Not on file  Food Insecurity: No Food Insecurity (07/09/2023)   Hunger Vital Sign    Worried About Running Out of Food in the Last Year: Never true    Ran Out of Food in the Last Year: Never true  Transportation Needs: No Transportation Needs (07/09/2023)   PRAPARE - Administrator, Civil Service (Medical): No    Lack of Transportation (Non-Medical): No  Physical Activity: Not on file  Stress: Not on file  Social Connections: Not on file  Intimate Partner Violence: Not At Risk (07/09/2023)   Humiliation, Afraid, Rape, and Kick questionnaire    Fear of Current or Ex-Partner: No    Emotionally Abused: No    Physically Abused: No    Sexually Abused: No    Medications:   Current Outpatient Medications on File Prior to Visit  Medication Sig Dispense Refill   acetaminophen (TYLENOL) 325 MG tablet Take 2 tablets (650 mg total) by mouth 3 (three) times daily as needed.     aspirin 81 MG chewable tablet Chew 1 tablet (81 mg total) by mouth daily. 30 tablet 2   bisoprolol (ZEBETA) 5 MG tablet Take 1 tablet (5 mg total) by mouth daily. 30 tablet 0   butalbital-acetaminophen-caffeine (FIORICET) 50-325-40 MG tablet Take 1 tablet by mouth every 6 (six) hours as needed for headache. 21 tablet 0   gabapentin (NEURONTIN) 100 MG capsule Take 1 capsule (100 mg total) by mouth 3 (three) times daily. 90 capsule 0   ramelteon (ROZEREM) 8 MG tablet Take 1 tablet (8 mg total) by mouth at bedtime. 30 tablet 0   rosuvastatin (CRESTOR) 20 MG tablet Take 1 tablet  (20 mg total) by mouth daily. 30 tablet 2   sertraline (ZOLOFT) 25 MG tablet Take 1 tablet (25 mg total) by mouth daily. 30 tablet 0   tamsulosin (FLOMAX) 0.4 MG CAPS capsule Take 1 capsule (0.4 mg total) by mouth daily after supper. 30 capsule 0   topiramate (TOPAMAX) 100 MG tablet Take 1 tablet (100 mg total) by mouth at bedtime. 30 tablet 0   traZODone (DESYREL) 100 MG tablet Take 1 tablet (100 mg total) by mouth at bedtime. 30 tablet 0   Vitamin D, Ergocalciferol, (DRISDOL) 1.25 MG (50000 UNIT) CAPS capsule Take 1 capsule (50,000 Units total) by mouth every 7 (seven) days. Take each Friday until finished. 5 capsule 0   No current facility-administered medications on file prior to visit.    Allergies:   Allergies  Allergen Reactions   Meat Extract Other (See Comments)    Pt vegetarian     Physical Exam General: well developed, well  nourished, seated, in no evident distress Head: head normocephalic and atraumatic.   Neck: supple with no carotid or supraclavicular bruits Cardiovascular: regular rate and rhythm, no murmurs Musculoskeletal: no deformity Skin:  no rash/petichiae Vascular:  Normal pulses all extremities  Neurologic Exam Mental Status: Awake and fully alert.  Severe expressive greater than receptive aphasia.  Able to follow simple midline and one-step commands only.  Significant word finding difficulty and paraphasic errors and unable to speak sentences.. Mood and affect appropriate.  Cranial Nerves: Fundoscopic exam reveals sharp disc margins. Pupils equal, briskly reactive to light. Extraocular movements full without nystagmus. Visual fields full to confrontation. Hearing intact. Facial sensation intact.  Mild right lower facial asymmetry., tongue, palate moves normally and symmetrically.  Motor: Mild right hemiparesis 4/5 strength with weakness of right grip and intrinsic hand muscles.  Orbits left or right upper extremity.  Mild weakness of right hip flexor and ankle  dorsiflexor.  Tone is increased in the right leg with mild spasticity.   Sensory.: intact to touch , pinprick , position and vibratory sensation.  Coordination: Rapid alternating movements normal in all extremities. Finger-to-nose and heel-to-shin performed accurately bilaterally. Gait and Station: Arises from chair without difficulty. Stance is normal.  Mild hemiparetic gait with slight stiffness in dragging of the right leg.   Reflexes: 2+ and asymmetric and brisker on the right.. Toes downgoing.   NIHSS  9 Modified Rankin  4   ASSESSMENT: 89 year old [place] lady with subarachnoid hemorrhage in August 2024 due to left MCA aneurysm status post endovascular coiling delayed left middle cerebral artery infarct etiology unclear as to delayed vasospasm versus coil embolism with significant residual expressive greater than receptive aphasia and mild right hemiparesis.     PLAN:I had a long d/w patient and son about her recent stroke, subarachnoid hemorrhage and aneurysm embolization and residual aphasia and right hemiparesis risk for recurrent stroke/TIAs, personally independently reviewed imaging studies and stroke evaluation results and answered questions.Continue aspirin 81 mg daily  for secondary stroke prevention and maintain strict control of hypertension with blood pressure goal below 130/90, diabetes with hemoglobin A1c goal below 6.5% and lipids with LDL cholesterol goal below 70 mg/dL. I also advised the patient to eat a healthy diet with plenty of whole grains, cereals, fruits and vegetables, exercise regularly and maintain ideal body weight .recommend outpatient speech therapy for language and aphasia.  Follow-up with Dr. Conchita Paris for aneurysm follow-up.  Followup in the future with my nurse practitioner in 6 months or call earlier if necessary.  Greater than 50% time during this 45-minute consultation visit was spent in counseling and coordination of care about her aphasia and hemiparesis  from left MCA infarct following aneurysm coiling for ruptured subarachnoid hemorrhage and answering questions.  Delia Heady, MD Note: This document was prepared with digital dictation and possible smart phrase technology. Any transcriptional errors that result from this process are unintentional.

## 2023-09-04 ENCOUNTER — Telehealth: Payer: Self-pay | Admitting: Neurology

## 2023-09-04 NOTE — Telephone Encounter (Signed)
Referral for speech therapy sent through EPIC to [facility]. Phone: [phone]

## 2023-10-02 ENCOUNTER — Ambulatory Visit: Payer: Medicare Other | Attending: Neurology

## 2023-10-02 DIAGNOSIS — I69320 Aphasia following cerebral infarction: Secondary | ICD-10-CM | POA: Insufficient documentation

## 2023-10-02 DIAGNOSIS — R4701 Aphasia: Secondary | ICD-10-CM | POA: Insufficient documentation

## 2023-10-02 NOTE — Therapy (Signed)
OUTPATIENT SPEECH LANGUAGE PATHOLOGY APHASIA EVALUATION   Patient Name: Yolanda [name] MRN: [id number] DOB:[birth year], 89 y.o., female Today's Date: 10/02/2023  PCP: None REFERRING PROVIDER: Micki Riley, MD  END OF SESSION:  End of Session - 10/02/23 1503     Visit Number 1    Number of Visits 5    Date for SLP Re-Evaluation 11/27/23   extended d/t delay in scheduling   Authorization Type Medicare    SLP Start Time 1405   delayed for interpretation   SLP Stop Time  1445    SLP Time Calculation (min) 40 min    Activity Tolerance Patient tolerated treatment well             Past Medical History:  Diagnosis Date   Closed fracture of left proximal humerus 03/27/2021   Closed fracture of lower end of left radius with routine healing 10/31/2020   Dementia (HCC)    Mild intermittent asthma without complication 04/04/2019   OSTEOARTHRITIS 08/04/2010   Annotation: Bilateral knees and ankles Qualifier: Diagnosis of  By: Delrae Alfred MD, Lanora Manis     Past Surgical History:  Procedure Laterality Date   IR 3D INDEPENDENT WKST  06/10/2023   IR ANGIO INTRA EXTRACRAN SEL INTERNAL CAROTID UNI L MOD SED  06/11/2023   IR ANGIOGRAM FOLLOW UP STUDY  06/10/2023   IR ANGIOGRAM FOLLOW UP STUDY  06/10/2023   IR NEURO EACH ADD'L AFTER BASIC UNI LEFT (MS)  06/10/2023   IR TRANSCATH/EMBOLIZ  06/10/2023   RADIOLOGY WITH ANESTHESIA N/A 06/10/2023   Procedure: IR WITH ANESTHESIA;  Surgeon: Lisbeth Renshaw, MD;  Location: [facility] OR;  Service: Radiology;  Laterality: N/A;   Patient Active Problem List   Diagnosis Date Noted   Acute encephalopathy 07/06/2023   CVA (cerebral vascular accident) (HCC) 07/06/2023   Essential hypertension 07/06/2023   History of subarachnoid hemorrhage 07/06/2023   Status post coil embolization of cerebral aneurysm 07/06/2023   Hyperkalemia 07/06/2023   Dysphagia 07/06/2023   Chronic diastolic CHF (congestive heart failure) (HCC) 07/06/2023   Acute respiratory failure  with hypoxia (HCC) 06/11/2023   Subarachnoid hemorrhage (HCC) 06/10/2023   Dementia (HCC) 06/29/2022   Closed fracture of left proximal humerus 03/27/2021   Closed fracture of lower end of left radius with routine healing 10/31/2020   Dry cough 04/04/2019   Mild intermittent asthma without complication 04/04/2019   Osteoarthritis 08/04/2010    ONSET DATE: 09/03/23 (referral date)   REFERRING DIAG: I69.320 (ICD-10-CM) - Aphasia as late effect of cerebrovascular accident  THERAPY DIAG: Aphasia  Rationale for Evaluation and Treatment: Rehabilitation  SUBJECTIVE:   SUBJECTIVE STATEMENT:  Son reported usual speech errors impacting listener comprehension. Relying on gestures, context, and prior knowledge of patient for communication at home. Pt accompanied by: family member and interpreter  PERTINENT HISTORY: "89 year old [place] lady with subarachnoid hemorrhage in August 2024 due to left MCA aneurysm status post endovascular coiling delayed left middle cerebral artery infarct etiology unclear as to delayed vasospasm versus coil embolism with significant residual expressive greater than receptive aphasia and mild right hemiparesis"   PAIN:  Are you having pain? No  FALLS: Has patient fallen in last 6 months?  No  LIVING ENVIRONMENT: Lives with: lives with their son Lives in: House/apartment  PLOF:  Level of assistance: Needed assistance with ADLs, Needed assistance with IADLS Employment: Retired  PATIENT GOALS: improve communication   OBJECTIVE:  Note: Objective measures were completed at Evaluation unless otherwise noted.  COGNITION: Overall cognitive status: History of  cognitive impairments - at baseline (dementia)  AUDITORY COMPREHENSION: Overall auditory comprehension: Appears intact for modified assessment today; son denied auditory comprehension concerns or challenges  READING COMPREHENSION: Impaired: unable to read at baseline  EXPRESSION: verbal and nonverbal-  gestures  VERBAL EXPRESSION: Level of generative/spontaneous verbalization: conversation Automatic speech: name: intact and social response: impaired  Repetition: Appears intact for single words Naming: Confrontation: 0-25% Interfering components: speech intelligibility and dementia Effective technique: semantic cues and articulatory cues Non-verbal means of communication: gestures Comments: per son and interpreter, speech c/b intermittent neologisms and perseverations. Difficult to understand patient. Reduced verbal output since stroke.    WRITTEN EXPRESSION:  unable to write at baseline  ORAL MOTOR EXAMINATION: Overall status: Did not formally assess Comments: Seems intact  STANDARDIZED ASSESSMENTS: Unable to fully complete d/t language difference; modified subtests of QAB: Picture naming: 1/6 & Word comprehension: 3/6  PATIENT REPORTED OUTCOME MEASURES (PROM): Not completed d/t language difference   TODAY'S TREATMENT:                                                                                                                                         10-02-23: Provided recommendations and instructions to gather salient information for generation of external communication aid/support. Also suggested use of repetition, given relative strength, to aid lexical connections for errored functional words. Son verbalized understanding. Handout provided.    PATIENT EDUCATION: Education details: ST recs, POC Person educated: Patient, Child(ren), and interpreter Education method: Explanation, Demonstration, and Handouts Education comprehension: verbalized understanding, returned demonstration, and needs further education   GOALS: Goals reviewed with patient? Yes  LONG TERM GOALS: Target date: 11/27/2023 (STG=LTGs)  Pt will utilize external communication aid to communicate wants/needs with 80% accuracy given occasional min A Baseline:  Goal status: INITIAL  2.  Caregiver will prompt  use of communication support to augment verbal expression for 2/3 opportunities in therapy  Baseline:  Goal status: INITIAL  3.  Caregiver will carryover SLP recommendations and external communication support at home > 1 week  Baseline:  Goal status: INITIAL   ASSESSMENT:  CLINICAL IMPRESSION: Patient is a 89 y.o. F who was seen today for aphasia s/p CVA in September 2024. PMHX significant for dementia, asthma, and HTN. Lives with son, who provided assistance with all iADLs/ADLs. Inconsistent verbal expression indicated today via son and intrepreter. Some spoken language was reportedly unintelligible, seemingly related to neologism and/or paraphasias. Some spoken language was reportedly intelligible and understood with occasional cued repetition required. Modified standardized aphasia assessment d/t language difference. Convergent naming was significantly impaired. Picture comprehension of common objects was largely accurate. Recommended ST intervention to generate and train use of external communication aid to optimize communication at home.   OBJECTIVE IMPAIRMENTS: include aphasia. These impairments are limiting patient from effectively communicating at home and in community. Factors affecting potential to achieve goals and functional outcome are ability to learn/carryover information, co-morbidities,  cooperation/participation level, previous level of function, severity of impairments, and family/community support. Patient will benefit from skilled SLP services to address above impairments and improve overall function.  REHAB POTENTIAL: Fair - dementia  PLAN:  SLP FREQUENCY: 1x/week  SLP DURATION: 8 weeks (extended d/t delay in scheduling)  PLANNED INTERVENTIONS: [phone]- [id number] Artic, Phon, Eval Compre, Express, [phone] Treatment of speech (30 or 45 min) , Re-evaluation, Language facilitation, Environmental controls, Cueing hierachy, Internal/external aids, Functional tasks,  Multimodal communication approach, SLP instruction and feedback, and Compensatory strategies    Gracy Racer, CCC-SLP 10/02/2023, 3:18 PM

## 2023-10-02 NOTE — Patient Instructions (Signed)
For communication support we want to create, please start to think of ideas/things she needs/wants to communicate:  Examples:  Foods/drinks Television shows  Gardening items  Clothing items  Family Members   If you have pictures of things above, please bring them to next speech therapy session

## 2023-10-30 ENCOUNTER — Ambulatory Visit: Payer: Medicare Other | Attending: Neurology

## 2023-10-30 DIAGNOSIS — R4701 Aphasia: Secondary | ICD-10-CM

## 2023-10-30 NOTE — Therapy (Signed)
 OUTPATIENT SPEECH LANGUAGE PATHOLOGY APHASIA EVALUATION   Patient Name: Yolanda [name] MRN: [id number] DOB:Dec 11, 1932, 89 y.o., female Today's Date: 10/30/2023  PCP: None REFERRING PROVIDER: Rosemarie Eather RAMAN, MD  END OF SESSION:  End of Session - 10/30/23 1426     Visit Number 2    Date for SLP Re-Evaluation 11/27/23    Authorization Type Medicare    SLP Start Time 1402    SLP Stop Time  1443    SLP Time Calculation (min) 41 min    Activity Tolerance Patient tolerated treatment well             Past Medical History:  Diagnosis Date   Closed fracture of left proximal humerus 03/27/2021   Closed fracture of lower end of left radius with routine healing 10/31/2020   Dementia (HCC)    Mild intermittent asthma without complication 04/04/2019   OSTEOARTHRITIS 08/04/2010   Annotation: Bilateral knees and ankles Qualifier: Diagnosis of  By: Adella MD, Almarie     Past Surgical History:  Procedure Laterality Date   IR 3D INDEPENDENT WKST  06/10/2023   IR ANGIO INTRA EXTRACRAN SEL INTERNAL CAROTID UNI L MOD SED  06/11/2023   IR ANGIOGRAM FOLLOW UP STUDY  06/10/2023   IR ANGIOGRAM FOLLOW UP STUDY  06/10/2023   IR NEURO EACH ADD'L AFTER BASIC UNI LEFT (MS)  06/10/2023   IR TRANSCATH/EMBOLIZ  06/10/2023   RADIOLOGY WITH ANESTHESIA N/A 06/10/2023   Procedure: IR WITH ANESTHESIA;  Surgeon: Lanis Pupa, MD;  Location: [facility] OR;  Service: Radiology;  Laterality: N/A;   Patient Active Problem List   Diagnosis Date Noted   Acute encephalopathy 07/06/2023   CVA (cerebral vascular accident) (HCC) 07/06/2023   Essential hypertension 07/06/2023   History of subarachnoid hemorrhage 07/06/2023   Status post coil embolization of cerebral aneurysm 07/06/2023   Hyperkalemia 07/06/2023   Dysphagia 07/06/2023   Chronic diastolic CHF (congestive heart failure) (HCC) 07/06/2023   Acute respiratory failure with hypoxia (HCC) 06/11/2023   Subarachnoid hemorrhage (HCC) 06/10/2023   Dementia  (HCC) 06/29/2022   Closed fracture of left proximal humerus 03/27/2021   Closed fracture of lower end of left radius with routine healing 10/31/2020   Dry cough 04/04/2019   Mild intermittent asthma without complication 04/04/2019   Osteoarthritis 08/04/2010    ONSET DATE: 09/03/23 (referral date)   REFERRING DIAG: I69.320 (ICD-10-CM) - Aphasia as late effect of cerebrovascular accident  THERAPY DIAG: Aphasia  Rationale for Evaluation and Treatment: Rehabilitation  SUBJECTIVE:   SUBJECTIVE STATEMENT: Son reported using repetition at home with pt demonstrating increased accuracy  Pt accompanied by: family member   PERTINENT HISTORY: 89 year old [place]e lady with subarachnoid hemorrhage in August 2024 due to left MCA aneurysm status post endovascular coiling delayed left middle cerebral artery infarct etiology unclear as to delayed vasospasm versus coil embolism with significant residual expressive greater than receptive aphasia and mild right hemiparesis   PAIN:  Are you having pain? No  FALLS: Has patient fallen in last 6 months?  No  LIVING ENVIRONMENT: Lives with: lives with their son Lives in: House/apartment  PLOF:  Level of assistance: Needed assistance with ADLs, Needed assistance with IADLS Employment: Retired  PATIENT GOALS: improve communication   OBJECTIVE:  Note: Objective measures were completed at Evaluation unless otherwise noted.  TODAY'S TREATMENT:  10-30-23: Reviewed ST goals to establish communication support given severity and inconsistency of communication impairment. Son verbalized agreement as pt with minimal spontaneous recovery of verbal expression in last month. SLP created and trained 2 low tech communication boards of 4-6 personally relevant icons per collaboration with son. With moderate fading to minimal  prompting, pt able to ID targeted icon with 80% accuracy. Accuracy improved to 95% accuracy across 3 trials. Provide two communication boards (4 vs. 6 icons) to trial at home.  10-02-23: Provided recommendations and instructions to gather salient information for generation of external communication aid/support. Also suggested use of repetition, given relative strength, to aid lexical connections for errored functional words. Son verbalized understanding. Handout provided.    PATIENT EDUCATION: Education details: ST recs, POC Person educated: Patient, Child(ren), and interpreter Education method: Explanation, Demonstration, and Handouts Education comprehension: verbalized understanding, returned demonstration, and needs further education   GOALS: Goals reviewed with patient? Yes  LONG TERM GOALS: Target date: 11/27/2023 (STG=LTGs)  Pt will utilize external communication aid to communicate wants/needs with 80% accuracy given occasional min A Baseline:  Goal status: IN PROGRESS  2.  Caregiver will prompt use of communication support to augment verbal expression for 2/3 opportunities in therapy  Baseline:  Goal status: IN PROGRESS  3.  Caregiver will carryover SLP recommendations and external communication support at home > 1 week  Baseline:  Goal status: IN PROGRESS   ASSESSMENT:  CLINICAL IMPRESSION: Patient is a 89 y.o. F who was seen today for aphasia s/p CVA in September 2024. PMHX significant for dementia, asthma, and HTN. Lives with son, who provided assistance with all iADLs/ADLs. Initiated development of personally relevant communication supports, with good comprehension and usage demonstrated in ST session. Plan to trial at home. Recommended ST intervention to generate and train use of external communication aid to optimize communication at home.   OBJECTIVE IMPAIRMENTS: include aphasia. These impairments are limiting patient from effectively communicating at home and in  community. Factors affecting potential to achieve goals and functional outcome are ability to learn/carryover information, co-morbidities, cooperation/participation level, previous level of function, severity of impairments, and family/community support. Patient will benefit from skilled SLP services to address above impairments and improve overall function.  REHAB POTENTIAL: Fair - dementia  PLAN:  SLP FREQUENCY: 1x/week  SLP DURATION: 8 weeks (extended d/t delay in scheduling)  PLANNED INTERVENTIONS: [phone]- [address] Artic, Phon, Eval Compre, Express, [phone] Treatment of speech (30 or 45 min) , Re-evaluation, Language facilitation, Environmental controls, Cueing hierachy, Internal/external aids, Functional tasks, Multimodal communication approach, SLP instruction and feedback, and Compensatory strategies    Comer LILLETTE Louder, CCC-SLP 10/30/2023, 2:46 PM

## 2023-11-05 NOTE — Therapy (Signed)
 OUTPATIENT SPEECH LANGUAGE PATHOLOGY APHASIA TREATMENT   Patient Name: Yolanda [name] MRN: [id number] DOB:January 27, 1933, 89 y.o., female Today's Date: 11/06/2023  PCP: None REFERRING PROVIDER: Lisabeth Rider, MD  END OF SESSION:  End of Session - 11/06/23 1448     Visit Number 3    Number of Visits 5    Date for SLP Re-Evaluation 11/27/23    Authorization Type Medicare    SLP Start Time 1400    SLP Stop Time  1440    SLP Time Calculation (min) 40 min    Activity Tolerance Patient tolerated treatment well              Past Medical History:  Diagnosis Date   Closed fracture of left proximal humerus 03/27/2021   Closed fracture of lower end of left radius with routine healing 10/31/2020   Dementia (HCC)    Mild intermittent asthma without complication 04/04/2019   OSTEOARTHRITIS 08/04/2010   Annotation: Bilateral knees and ankles Qualifier: Diagnosis of  By: Jayne Mews MD, Nellie Banas     Past Surgical History:  Procedure Laterality Date   IR 3D INDEPENDENT WKST  06/10/2023   IR ANGIO INTRA EXTRACRAN SEL INTERNAL CAROTID UNI L MOD SED  06/11/2023   IR ANGIOGRAM FOLLOW UP STUDY  06/10/2023   IR ANGIOGRAM FOLLOW UP STUDY  06/10/2023   IR NEURO EACH ADD'L AFTER BASIC UNI LEFT (MS)  06/10/2023   IR TRANSCATH/EMBOLIZ  06/10/2023   RADIOLOGY WITH ANESTHESIA N/A 06/10/2023   Procedure: IR WITH ANESTHESIA;  Surgeon: Augusto Blonder, MD;  Location: [facility] OR;  Service: Radiology;  Laterality: N/A;   Patient Active Problem List   Diagnosis Date Noted   Acute encephalopathy 07/06/2023   CVA (cerebral vascular accident) (HCC) 07/06/2023   Essential hypertension 07/06/2023   History of subarachnoid hemorrhage 07/06/2023   Status post coil embolization of cerebral aneurysm 07/06/2023   Hyperkalemia 07/06/2023   Dysphagia 07/06/2023   Chronic diastolic CHF (congestive heart failure) (HCC) 07/06/2023   Acute respiratory failure with hypoxia (HCC) 06/11/2023   Subarachnoid hemorrhage (HCC)  06/10/2023   Dementia (HCC) 06/29/2022   Closed fracture of left proximal humerus 03/27/2021   Closed fracture of lower end of left radius with routine healing 10/31/2020   Dry cough 04/04/2019   Mild intermittent asthma without complication 04/04/2019   Osteoarthritis 08/04/2010    ONSET DATE: 09/03/23 (referral date)   REFERRING DIAG: I69.320 (ICD-10-CM) - Aphasia as late effect of cerebrovascular accident  THERAPY DIAG: Aphasia  Rationale for Evaluation and Treatment: Rehabilitation  SUBJECTIVE:   SUBJECTIVE STATEMENT: Son reported compliance with HEP implementing low tech communication board at home. Pt accompanied by: family member   PERTINENT HISTORY: "89 year old Yolanda [name] lady with subarachnoid hemorrhage in August 2024 due to left MCA aneurysm status post endovascular coiling delayed left middle cerebral artery infarct etiology unclear as to delayed vasospasm versus coil embolism with significant residual expressive greater than receptive aphasia and mild right hemiparesis"   PAIN:  Are you having pain? No  FALLS: Has patient fallen in last 6 months?  No  LIVING ENVIRONMENT: Lives with: lives with their son Lives in: House/apartment  PLOF:  Level of assistance: Needed assistance with ADLs, Needed assistance with IADLS Employment: Retired  PATIENT GOALS: improve communication   OBJECTIVE:  Note: Objective measures were completed at Evaluation unless otherwise noted.  TODAY'S TREATMENT:  11-06-23: Son reported use of communication board at home and ST facilitated discussion regarding routines/topics in which alternate methods of communication (picture cards, communication boards, etc.) would/have improved QoL and functional language needs. ST reviewed icons on previously generated communication board with the pt. Pt demonstrated  decreased accuracy (50%) pointing to picture icons when described. SLP retrained both pt and family member on low tech communication boards and developed two additional communication boards for relevant daily routines/tasks. ST implemented additional practice accessing alternate communication via color board and pt able to identify targeted color with 30% accuracy. ST plans to complete additional education and training of communication supports due to potential underlying cognitive component.   10-30-23: Reviewed ST goals to establish communication support given severity and inconsistency of communication impairment. Son verbalized agreement as pt with minimal spontaneous recovery of verbal expression in last month. SLP created and trained 2 low tech communication boards of 4-6 personally relevant icons per collaboration with son. With moderate fading to minimal prompting, pt able to ID targeted icon with 80% accuracy. Accuracy improved to 95% accuracy across 3 trials. Provide two communication boards (4 vs. 6 icons) to trial at home.  10-02-23: Provided recommendations and instructions to gather salient information for generation of external communication aid/support. Also suggested use of repetition, given relative strength, to aid lexical connections for errored functional words. Son verbalized understanding. Handout provided.    PATIENT EDUCATION: Education details: ST recs, POC Person educated: Patient, Child(ren), and interpreter Education method: Explanation, Demonstration, and Handouts Education comprehension: verbalized understanding, returned demonstration, and needs further education   GOALS: Goals reviewed with patient? Yes  LONG TERM GOALS: Target date: 11/27/2023 (STG=LTGs)  Pt will utilize external communication aid to communicate wants/needs with 80% accuracy given occasional min A Baseline:  Goal status: IN PROGRESS  2.  Caregiver will prompt use of communication support to  augment verbal expression for 2/3 opportunities in therapy  Baseline:  Goal status: IN PROGRESS  3.  Caregiver will carryover SLP recommendations and external communication support at home > 1 [name]  Baseline:  Goal status: IN PROGRESS   ASSESSMENT:  CLINICAL IMPRESSION: Patient is a 89 y.o. F who was seen today for aphasia s/p CVA in September 2024. PMHX significant for dementia, asthma, and HTN. Lives with son, who provided assistance with all iADLs/ADLs. Continued development of personally relevant communication supports, inconsistent performance utilizing board in ST session. Plan to continue education and training in upcoming sessions. Recommended ST intervention to continue training and educating pt and family on use of external communication aids to optimize communication at home.   OBJECTIVE IMPAIRMENTS: include aphasia. These impairments are limiting patient from effectively communicating at home and in community. Factors affecting potential to achieve goals and functional outcome are ability to learn/carryover information, co-morbidities, cooperation/participation level, previous level of function, severity of impairments, and family/community support. Patient will benefit from skilled SLP services to address above impairments and improve overall function.  REHAB POTENTIAL: Fair - dementia  PLAN:  SLP FREQUENCY: 1x/[name]  SLP DURATION: 8 weeks (extended d/t delay in scheduling)  PLANNED INTERVENTIONS: [phone]- [address] Artic, Phon, Eval Compre, Express, [phone] Treatment of speech (30 or 45 min) , Re-evaluation, Language facilitation, Environmental controls, Cueing hierachy, Internal/external aids, Functional tasks, Multimodal communication approach, SLP instruction and feedback, and Compensatory strategies    Tamar Fairly, CCC-SLP 11/06/2023, 2:49 PM

## 2023-11-06 ENCOUNTER — Ambulatory Visit: Payer: Medicare Other

## 2023-11-06 DIAGNOSIS — R4701 Aphasia: Secondary | ICD-10-CM

## 2023-11-06 NOTE — Patient Instructions (Signed)
 Recommendations: Practice using picture boards constantly at home. It will take lots of practice to make these a habit!   Ask her to point to the bathroom picture before she goes. Practice this when you are both sitting together (ex: at night; before bedtime).  Point to the picture then say the word "bathroom" or "toilet"  Then you can help her to the restroom and get her lined up in the right spot

## 2023-11-13 ENCOUNTER — Ambulatory Visit: Payer: Medicare Other | Admitting: Speech Pathology

## 2023-11-20 ENCOUNTER — Encounter: Payer: Self-pay | Admitting: Speech Pathology

## 2023-11-20 ENCOUNTER — Ambulatory Visit: Payer: Medicare Other | Admitting: Speech Pathology

## 2023-11-20 DIAGNOSIS — R4701 Aphasia: Secondary | ICD-10-CM

## 2023-11-20 NOTE — Therapy (Signed)
OUTPATIENT SPEECH LANGUAGE PATHOLOGY APHASIA TREATMENT & DISCHARGE SUMMARY   Patient Name: Yolanda [name] MRN: [id number] DOB:1933-05-31, 89 y.o., female Today's Date: 11/20/2023  PCP: None REFERRING PROVIDER: Micki Riley, MD  END OF SESSION:  End of Session - 11/20/23 1317     Visit Number 4    Number of Visits 5    Date for SLP Re-Evaluation 11/27/23    Authorization Type Medicare    SLP Start Time 1315    SLP Stop Time  1400    SLP Time Calculation (min) 45 min    Activity Tolerance Patient tolerated treatment well              Past Medical History:  Diagnosis Date   Closed fracture of left proximal humerus 03/27/2021   Closed fracture of lower end of left radius with routine healing 10/31/2020   Dementia (HCC)    Mild intermittent asthma without complication 04/04/2019   OSTEOARTHRITIS 08/04/2010   Annotation: Bilateral knees and ankles Qualifier: Diagnosis of  By: Yolanda Alfred MD, Yolanda [name]     Past Surgical History:  Procedure Laterality Date   IR 3D INDEPENDENT WKST  06/10/2023   IR ANGIO INTRA EXTRACRAN SEL INTERNAL CAROTID UNI L MOD SED  06/11/2023   IR ANGIOGRAM FOLLOW UP STUDY  06/10/2023   IR ANGIOGRAM FOLLOW UP STUDY  06/10/2023   IR NEURO EACH ADD'L AFTER BASIC UNI LEFT (MS)  06/10/2023   IR TRANSCATH/EMBOLIZ  06/10/2023   RADIOLOGY WITH ANESTHESIA N/A 06/10/2023   Procedure: IR WITH ANESTHESIA;  Surgeon: Yolanda Renshaw, MD;  Location: [facility] OR;  Service: Radiology;  Laterality: N/A;   Patient Active Problem List   Diagnosis Date Noted   Acute encephalopathy 07/06/2023   CVA (cerebral vascular accident) (HCC) 07/06/2023   Essential hypertension 07/06/2023   History of subarachnoid hemorrhage 07/06/2023   Status post coil embolization of cerebral aneurysm 07/06/2023   Hyperkalemia 07/06/2023   Dysphagia 07/06/2023   Chronic diastolic CHF (congestive heart failure) (HCC) 07/06/2023   Acute respiratory failure with hypoxia (HCC) 06/11/2023    Subarachnoid hemorrhage (HCC) 06/10/2023   Dementia (HCC) 06/29/2022   Closed fracture of left proximal humerus 03/27/2021   Closed fracture of lower end of left radius with routine healing 10/31/2020   Dry cough 04/04/2019   Mild intermittent asthma without complication 04/04/2019   Osteoarthritis 08/04/2010    ONSET DATE: 09/03/23 (referral date)   REFERRING DIAG: I69.320 (ICD-10-CM) - Aphasia as late effect of cerebrovascular accident  THERAPY DIAG: Aphasia  Rationale for Evaluation and Treatment: Rehabilitation  SUBJECTIVE:   SUBJECTIVE STATEMENT: Son reported compliance with HEP implementing low tech communication board at home. Pt accompanied by: family member   PERTINENT HISTORY: "89 year old [place] lady with subarachnoid hemorrhage in August 2024 due to left MCA aneurysm status post endovascular coiling delayed left middle cerebral artery infarct etiology unclear as to delayed vasospasm versus coil embolism with significant residual expressive greater than receptive aphasia and mild right hemiparesis"   PAIN:  Are you having pain? No  FALLS: Has patient fallen in last 6 months?  No  LIVING ENVIRONMENT: Lives with: lives with their son Lives in: House/apartment  PLOF:  Level of assistance: Needed assistance with ADLs, Needed assistance with IADLS Employment: Retired  PATIENT GOALS: improve communication   OBJECTIVE:  Note: Objective measures were completed at Evaluation unless otherwise noted.  TODAY'S TREATMENT:  1-29/25: Son reports inconsistent success with low tech picture communication boards. The family is using them with Yolanda [name]. Son continues to report difficulty with his mom remembering to wash her hands after the bathroom. We generated communication board/memory support of hand washing and using towels. He also continues  to report missing the toilet due to Yolanda [name] standing rather than sitting. We generated communication/memory support with cartoon of girl sitting on the toilet. Provided 2, as she stands both facing out and facing the toilet. With questioning cues, Yolanda [name] washing hands and using towel photos 4/5 trials. She verbalized "sitting on the toilet" and endorsed that she does sit on the toilet. Educated son to show her the picture, instruct her to sit like the picture and have the picture visible while toileting. Also generated communication support of favorite activity (bird watching) with 3 of her common backyard birds. She pointed to them by color, ones who fed on the ground and [name] her favorite type of bird from the 3. Generated weather/seasons communication board as well - Yolanda [name] pointed to the rain, sunny, and snow 3/5 with semantic cues for snow. She communicated that she likes to be in the sunroom watching birds in the rain. Son aware that family will have to initiate use of communication and memory boards each time due to pt's poor initiation and dementia. Son demonstrated adequate semantic and questioning cues to facilitate use of communication boards during this session with mod I.   11-06-23: Son reported use of communication board at home and ST facilitated discussion regarding routines/topics in which alternate methods of communication (picture cards, communication boards, etc.) would/have improved QoL and functional language needs. ST reviewed icons on previously generated communication board with the pt. Pt demonstrated decreased accuracy (50%) pointing to picture icons when described. SLP retrained both pt and family member on low tech communication boards and developed two additional communication boards for relevant daily routines/tasks. ST implemented additional practice accessing alternate communication via color board and pt able to identify targeted color with 30% accuracy. ST plans to complete  additional education and training of communication supports due to potential underlying cognitive component.   10-30-23: Reviewed ST goals to establish communication support given severity and inconsistency of communication impairment. Son verbalized agreement as pt with minimal spontaneous recovery of verbal expression in last month. SLP created and trained 2 low tech communication boards of 4-6 personally relevant icons per collaboration with son. With moderate fading to minimal prompting, pt able to ID targeted icon with 80% accuracy. Accuracy improved to 95% accuracy across 3 trials. Provide two communication boards (4 vs. 6 icons) to trial at home.  10-02-23: Provided recommendations and instructions to gather salient information for generation of external communication aid/support. Also suggested use of repetition, given relative strength, to aid lexical connections for errored functional words. Son verbalized understanding. Handout provided.    PATIENT EDUCATION: Education details: ST recs, POC Person educated: Patient, Child(ren), and interpreter Education method: Explanation, Demonstration, and Handouts Education comprehension: verbalized understanding, returned demonstration, and needs further education   GOALS: Goals reviewed with patient? Yes  LONG TERM GOALS: Target date: 11/27/2023 (STG=LTGs)  Pt will utilize external communication aid to communicate wants/needs with 80% accuracy given occasional min A Baseline:  Goal status: NOT MET  2.  Caregiver will prompt use of communication support to augment verbal expression for 2/3 opportunities in therapy  Baseline:  Goal status: MET  3.  Caregiver will carryover SLP recommendations and external communication support at home >  1 week  Baseline:  Goal status: MET   ASSESSMENT:  CLINICAL IMPRESSION: Patient is a 89 y.o. F who was seen today for aphasia s/p CVA in September 2024. PMHX significant for dementia, asthma, and HTN.  Lives with son, who provided assistance with all iADLs/ADLs. Continued development of personally relevant communication supports, inconsistent performance utilizing board in ST session. Ongoing education and training completed for family use of communication boards and visual memory supports. Son had no additional concerns or requests for other topics of communication boards. He does not identify other needs for visual memory supports at home. He is aware to use supports consistently and each time due to pt's dementia, D/C ST at this time.   OBJECTIVE IMPAIRMENTS: include aphasia. These impairments are limiting patient from effectively communicating at home and in community. Factors affecting potential to achieve goals and functional outcome are ability to learn/carryover information, co-morbidities, cooperation/participation level, previous level of function, severity of impairments, and family/community support. Patient will benefit from skilled SLP services to address above impairments and improve overall function.  REHAB POTENTIAL: Fair - dementia  PLAN:  SLP FREQUENCY: 1x/week  SLP DURATION: 8 weeks (extended d/t delay in scheduling)  PLANNED INTERVENTIONS: [phone]- Speech [address] Artic, Phon, Eval Compre, Express, [phone] Treatment of speech (30 or 45 min) , Re-evaluation, Language facilitation, Environmental controls, Cueing hierachy, Internal/external aids, Functional tasks, Multimodal communication approach, SLP instruction and feedback, and Compensatory strategies  SPEECH THERAPY DISCHARGE SUMMARY  Visits from Start of Care: 4  Current functional level related to goals / functional outcomes: See goals below   Remaining deficits: Aphasia, dementia   Education / Equipment: Low tech [employer] and memory supports; cueing/family use of AAC and memory supports   Patient agrees to discharge. Patient goals were met. Patient is being discharged due to meeting the stated rehab  goals.Dara Hoyer, CCC-SLP 11/20/2023, 2:19 PM

## 2023-11-20 NOTE — Patient Instructions (Signed)
   Sometimes she may have issues with discomfort that she can't communicate, for example, constipation, UTI or hard toilet seat - you may have to ask her questions to make sure she is doing OK   The stop sign can be used on a door or cabinet if she starts to wander to get into things she shouldn't

## 2024-03-11 NOTE — Progress Notes (Signed)
 [facility] [address] [place]. [place] [address] [phone]       OFFICE FOLLOW UP NOTE  [name] Date of Birth:  July 05, 1933 Medical Record Number:  [id number]   Primary neurologist: Dr. Janett [name] Reason for visit: Stroke follow-up   Chief Complaint  Patient presents with   Cerebrovascular Accident    Rm 3 with son and cone interpreter  Pt is well, son reports she is still having some speech impairment  No other concerns.      HPI:   Update 03/12/2024 JM: Patient returns for 47-month follow-up visit accompanied by her son who provides majority of history assisted by [facility] interpreter. Overall stable since prior visit. Continued speech and language difficulties, denies any improvement since prior visit but denies any worsening. Completed 4 sessions of SLP back in January, sessions limited d/t baseline dementia. No significant residual weakness.  Ambulates with a cane, no recent falls.  Lives with her son who assists with ADLs more so for safety reasons and baseline dementia.  Son assists with medications, reports compliance on aspirin  and Crestor .  Blood pressure well-controlled.  Per son, PCP is Dr. Basilia [name] in [facility] and follows with him routinely.  Son reports she did have follow-up with neurosurgery Dr. Nat [name] after hospitalization, is unsure when f/u visit is.  No questions or concerns at this time.    Initial visit 09/03/2023 Dr. Janett [name]: [name] is a 89 year old [place] lady seen today for initial office consultation visit for stroke.  She is accompanied by her son who translates and provides most of the history.  I have also reviewed electronic medical records and pertinent available imaging films in PACS.  She has past medical history of dementia and osteoarthritis.  She initially presented on 05/31/2023 and was fine the night prior to admission.  She woke up the next day with a splitting headache right facial droop.  She started throwing  up and EMS were called.  Systolic blood pressure was in the 180s.  She was brought in as a code stroke.  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  Neurosurgery was consulted and patient was admitted to the neurological ICU on the critical care service.  Patient underwent endovascular coiling of the left MCA aneurysm by Dr. Nat [name]. CT repeat stable SAH. LDL 132, A1c 5.8. 2D echo EF 70 to 75%.  Transcranial Doppler studies did not show any vasospasm.  She was eventually extubated and developed stridor requiring reintubation.  He gradually improved.  Family did not want PEG tube and declined palliative care patient made gradual improvement.  Hospital course of complicated by mild aspiration pneumonia which was treated with Unasyn .  She was transferred to inpatient rehab where she spent 4 weeks and was discharged home.  She presented the next day on 07/06/2023 with agitated delirium along with right-sided weakness and increased drooling.  CT angiogram showed successful coiling of the aneurysm and CT scan showed acute to subacute left frontal operculum and basal ganglia infarct.  EEG was negative for seizures.  Etiology of left MCA hypodensity was felt to be related to vasospasm from the recent subarachnoid hemorrhage.  MRI scan confirmed acute to subacute left frontal operculum and basal ganglia infarct.  Cerebral catheter angiogram was suggested to look for vasospasm which may need endovascular treatment but family declined and did not want any  aggressive or interventional treatment.  Patient was started on aspirin  for stroke prevention..  Patient was eventually discharged home.  She had home physical and occupational speech therapy which has since discontinued.  She continues to have significant trouble expressing herself and speaking and understanding also seems  to be impaired to some degree but she also has baseline dementia.  Right-sided strength is improving and she is able to ambulate now slowly independently.  She still has limited use of her right hand which is still weak.  She does have excessive drooling at times on the right side as well.  She is tolerating aspirin  well without any side effects and Crestor  as well.  The family has been reluctant to consider outpatient therapies due to logistical issues of transportation.    ROS:   14 system review of systems is positive for those listed in HPI and all other systems negative  PMH:  Past Medical History:  Diagnosis Date   Closed fracture of left proximal humerus 03/27/2021   Closed fracture of lower end of left radius with routine healing 10/31/2020   Dementia (HCC)    Mild intermittent asthma without complication 04/04/2019   OSTEOARTHRITIS 08/04/2010   Annotation: Bilateral knees and ankles Qualifier: Diagnosis of  By: Yolanda Mews MD, Yolanda [name]      Social History:  Social History   Socioeconomic History   Marital status: Married    Spouse name: Not on file   Number of children: Not on file   Years of education: Not on file   Highest education level: Not on file  Occupational History   Not on file  Tobacco Use   Smoking status: Never   Smokeless tobacco: Not on file  Substance and Sexual Activity   Alcohol use: No   Drug use: No   Sexual activity: Not on file  Other Topics Concern   Not on file  Social History Narrative   Not on file   Social Drivers of Health   Financial Resource Strain: Not on file  Food Insecurity: No Food Insecurity (07/09/2023)   Hunger Vital Sign    Worried About Running Out of Food in the Last Year: Never true    Ran Out of Food in the Last Year: Never true  Transportation Needs: No Transportation Needs (07/09/2023)   PRAPARE - Administrator, Civil Service (Medical): No    Lack of Transportation (Non-Medical): No  Physical Activity: Not  on file  Stress: Not on file  Social Connections: Not on file  Intimate Partner Violence: Not At Risk (07/09/2023)   Humiliation, Afraid, Rape, and Kick questionnaire    Fear of Current or Ex-Partner: No    Emotionally Abused: No    Physically Abused: No    Sexually Abused: No    Medications:   Current Outpatient Medications on File Prior to Visit  Medication Sig Dispense Refill   acetaminophen  (TYLENOL ) 325 MG tablet Take 2 tablets (650 mg total) by mouth 3 (three) times daily as needed.     aspirin  81 MG chewable tablet Chew 1 tablet (81 mg total) by mouth daily. 30 tablet 2   bisoprolol  (ZEBETA ) 5 MG tablet Take 1 tablet (5 mg total) by mouth daily. 30 tablet 0   butalbital -acetaminophen -caffeine  (FIORICET ) 50-325-40 MG tablet Take 1 tablet by mouth every 6 (six) hours as needed for headache. 21 tablet 0   gabapentin  (NEURONTIN ) 100 MG capsule Take 1 capsule (100 mg total) by mouth 3 (three) times daily. 90 capsule  0   ramelteon  (ROZEREM ) 8 MG tablet Take 1 tablet (8 mg total) by mouth at bedtime. 30 tablet 0   rosuvastatin  (CRESTOR ) 20 MG tablet Take 1 tablet (20 mg total) by mouth daily. 30 tablet 2   sertraline  (ZOLOFT ) 25 MG tablet Take 1 tablet (25 mg total) by mouth daily. 30 tablet 0   tamsulosin  (FLOMAX ) 0.4 MG CAPS capsule Take 1 capsule (0.4 mg total) by mouth daily after supper. 30 capsule 0   topiramate  (TOPAMAX ) 100 MG tablet Take 1 tablet (100 mg total) by mouth at bedtime. 30 tablet 0   traZODone  (DESYREL ) 100 MG tablet Take 1 tablet (100 mg total) by mouth at bedtime. 30 tablet 0   Vitamin D , Ergocalciferol , (DRISDOL ) 1.25 MG (50000 UNIT) CAPS capsule Take 1 capsule (50,000 Units total) by mouth every 7 (seven) days. Take each Friday until finished. 5 capsule 0   No current facility-administered medications on file prior to visit.    Allergies:   Allergies  Allergen Reactions   Meat Extract Other (See Comments)    Pt vegetarian     Physical Exam Today's Vitals    03/12/24 0843  BP: 137/73  Pulse: 89  Weight: 122 lb (55.3 kg)  Height: 4\' 9"  (1.448 m)   Body mass index is 26.4 kg/m.  General: well developed, well nourished, seated, in no evident distress  Neurologic Exam Mental Status: Awake and fully alert.  Severe expressive greater than receptive aphasia.  Able to follow simple midline and one-step commands only.  Significant word finding difficulty and paraphasic errors and unable to speak sentences per son. Mood and affect appropriate.  Cranial Nerves: Pupils equal, briskly reactive to light. Extraocular movements full without nystagmus. Visual fields full to confrontation. Hearing intact. Facial sensation intact. Face, tongue, palate moves normally and symmetrically.  Motor: Unable to appreciate any significant weakness although difficulty fully testing as she had difficulty following commands Sensory.: intact to touch , pinprick , position and vibratory sensation.  Coordination: Rapid alternating movements normal in all extremities. Finger-to-nose and heel-to-shin difficulty performing Gait and Station: Arises from chair without difficulty. Stance is normal.  Gait demonstrates antalgic gait, unsteadiness, carrying cane, some difficulty with turns, tandem walk and heel toe not attempted Reflexes: 2+ and asymmetric and brisker on the right.. Toes downgoing.         ASSESSMENT: 89 year old [place] lady with subarachnoid hemorrhage in August 2024 due to left MCA aneurysm status post endovascular coiling delayed L MCA infarct etiology unclear as to delayed vasospasm versus coil embolism with significant residual expressive greater than receptive aphasia.     PLAN:  -Encouraged continued exercises at home for residual aphasia.  Recommend use of cane at all times for fall prevention - continue aspirin  81mg  daily and Crestor  20mg  daily for secondary stroke prevention managed/prescribed by PCP - continue close PCP follow up for aggressive  stroke risk factor management  - continue to follow with Dr. Nat [name] for aneurysm follow up, advised to contact office to see when f/u visit was recommended     No further recommendations from stroke standpoint and is routinely followed by PCP, Dr. Ervin.  Patient can follow-up on an as-needed basis but advised to call with any questions or concerns in the future      I spent 30 minutes of face-to-face and non-face-to-face time with patient and son assisted by interpreter.  This included previsit chart review, lab review, study review, electronic health record documentation, patient and son education and discussion  regarding above diagnoses and treatment plan and answered all other questions to patient and son's satisfaction  Johny Nap, [facility]  [facility] [address] [place], [place] [address]  Phone [phone] Fax [phone] Note: This document was prepared with digital dictation and possible smart phrase technology. Any transcriptional errors that result from this process are unintentional.

## 2024-03-12 ENCOUNTER — Ambulatory Visit (INDEPENDENT_AMBULATORY_CARE_PROVIDER_SITE_OTHER): Payer: Medicare Other | Admitting: Adult Health

## 2024-03-12 ENCOUNTER — Encounter: Payer: Self-pay | Admitting: Adult Health

## 2024-03-12 VITALS — BP 137/73 | HR 89 | Ht <= 58 in | Wt 122.0 lb

## 2024-03-12 DIAGNOSIS — I69320 Aphasia following cerebral infarction: Secondary | ICD-10-CM | POA: Diagnosis not present

## 2024-03-12 DIAGNOSIS — Z9889 Other specified postprocedural states: Secondary | ICD-10-CM | POA: Diagnosis not present

## 2024-03-12 DIAGNOSIS — I63512 Cerebral infarction due to unspecified occlusion or stenosis of left middle cerebral artery: Secondary | ICD-10-CM | POA: Diagnosis not present

## 2024-03-12 NOTE — Patient Instructions (Signed)
Continue aspirin 81 mg daily  and Crestor  for secondary stroke prevention  Continue to follow up with PCP regarding blood pressure and cholesterol management  Maintain strict control of hypertension with blood pressure goal below 130/90 and cholesterol with LDL cholesterol (bad cholesterol) goal below 70 mg/dL.   Signs of a Stroke? Follow the BEFAST method:  Balance Watch for a sudden loss of balance, trouble with coordination or vertigo Eyes Is there a sudden loss of vision in one or both eyes? Or double vision?  Face: Ask the person to smile. Does one side of the face droop or is it numb?  Arms: Ask the person to raise both arms. Does one arm drift downward? Is there weakness or numbness of a leg? Speech: Ask the person to repeat a simple phrase. Does the speech sound slurred/strange? Is the person confused ? Time: If you observe any of these signs, call [phone].       Thank you for coming to see us at [facility]. I hope we have been able to provide you high quality care today.  You may receive a patient satisfaction survey over the next few weeks. We would appreciate your feedback and comments so that we may continue to improve ourselves and the health of our patients.  

## 2024-06-21 ENCOUNTER — Encounter (HOSPITAL_BASED_OUTPATIENT_CLINIC_OR_DEPARTMENT_OTHER): Payer: Self-pay | Admitting: Emergency Medicine

## 2024-06-21 ENCOUNTER — Emergency Department (HOSPITAL_BASED_OUTPATIENT_CLINIC_OR_DEPARTMENT_OTHER)

## 2024-06-21 ENCOUNTER — Emergency Department (HOSPITAL_BASED_OUTPATIENT_CLINIC_OR_DEPARTMENT_OTHER)
Admission: EM | Admit: 2024-06-21 | Discharge: 2024-06-21 | Disposition: A | Discharge status: Home / Self Care | Attending: Emergency Medicine | Admitting: Emergency Medicine

## 2024-06-21 ENCOUNTER — Other Ambulatory Visit: Payer: Self-pay

## 2024-06-21 DIAGNOSIS — J45909 Unspecified asthma, uncomplicated: Secondary | ICD-10-CM | POA: Insufficient documentation

## 2024-06-21 DIAGNOSIS — S0990XA Unspecified injury of head, initial encounter: Secondary | ICD-10-CM | POA: Diagnosis not present

## 2024-06-21 DIAGNOSIS — S299XXA Unspecified injury of thorax, initial encounter: Secondary | ICD-10-CM | POA: Diagnosis present

## 2024-06-21 DIAGNOSIS — S2232XA Fracture of one rib, left side, initial encounter for closed fracture: Secondary | ICD-10-CM | POA: Diagnosis not present

## 2024-06-21 DIAGNOSIS — S2242XA Multiple fractures of ribs, left side, initial encounter for closed fracture: Secondary | ICD-10-CM

## 2024-06-21 DIAGNOSIS — W108XXA Fall (on) (from) other stairs and steps, initial encounter: Secondary | ICD-10-CM | POA: Insufficient documentation

## 2024-06-21 DIAGNOSIS — R109 Unspecified abdominal pain: Secondary | ICD-10-CM | POA: Insufficient documentation

## 2024-06-21 DIAGNOSIS — F0390 Unspecified dementia without behavioral disturbance: Secondary | ICD-10-CM | POA: Diagnosis not present

## 2024-06-21 DIAGNOSIS — W19XXXA Unspecified fall, initial encounter: Secondary | ICD-10-CM

## 2024-06-21 HISTORY — DX: Cerebral infarction, unspecified: I63.9

## 2024-06-21 LAB — CBC WITH DIFFERENTIAL/PLATELET
Abs Immature Granulocytes: 0.15 K/uL — ABNORMAL HIGH (ref 0.00–0.07)
Basophils Absolute: 0.1 K/uL (ref 0.0–0.1)
Basophils Relative: 1 %
Eosinophils Absolute: 0.3 K/uL (ref 0.0–0.5)
Eosinophils Relative: 4 %
HCT: 41.8 % (ref 36.0–46.0)
Hemoglobin: 13.5 g/dL (ref 12.0–15.0)
Immature Granulocytes: 2 %
Lymphocytes Relative: 20 %
Lymphs Abs: 1.9 K/uL (ref 0.7–4.0)
MCH: 27.6 pg (ref 26.0–34.0)
MCHC: 32.3 g/dL (ref 30.0–36.0)
MCV: 85.3 fL (ref 80.0–100.0)
Monocytes Absolute: 0.6 K/uL (ref 0.1–1.0)
Monocytes Relative: 7 %
Neutro Abs: 6.1 K/uL (ref 1.7–7.7)
Neutrophils Relative %: 66 %
Platelets: 229 K/uL (ref 150–400)
RBC: 4.9 MIL/uL (ref 3.87–5.11)
RDW: 14.4 % (ref 11.5–15.5)
WBC: 9.1 K/uL (ref 4.0–10.5)
nRBC: 0 % (ref 0.0–0.2)

## 2024-06-21 LAB — COMPREHENSIVE METABOLIC PANEL WITH GFR
ALT: 16 U/L (ref 0–44)
AST: 15 U/L (ref 15–41)
Albumin: 4 g/dL (ref 3.5–5.0)
Alkaline Phosphatase: 92 U/L (ref 38–126)
Anion gap: 10 (ref 5–15)
BUN: 11 mg/dL (ref 8–23)
CO2: 28 mmol/L (ref 22–32)
Calcium: 9.7 mg/dL (ref 8.9–10.3)
Chloride: 105 mmol/L (ref 98–111)
Creatinine, Ser: 0.7 mg/dL (ref 0.44–1.00)
GFR, Estimated: >60 mL/min (ref >60)
Glucose, Bld: 119 mg/dL — ABNORMAL HIGH (ref 70–99)
Potassium: 4.1 mmol/L (ref 3.5–5.1)
Sodium: 142 mmol/L (ref 135–145)
Total Bilirubin: 0.6 mg/dL (ref 0.0–1.2)
Total Protein: 7.1 g/dL (ref 6.5–8.1)

## 2024-06-21 LAB — TROPONIN T, HIGH SENSITIVITY: Troponin T High Sensitivity: <15 ng/L (ref 0–19)

## 2024-06-21 MED ORDER — SENNOSIDES-DOCUSATE SODIUM 8.6-50 MG PO TABS: 1.0000 | ORAL_TABLET | Freq: Every evening | ORAL | 0 refills | Status: AC | PRN

## 2024-06-21 MED ORDER — FENTANYL CITRATE PF 50 MCG/ML IJ SOSY
25.0000 ug | PREFILLED_SYRINGE | Freq: Once | INTRAMUSCULAR | Status: AC
  Administered 2024-06-21: 25 ug via INTRAVENOUS
  Filled 2024-06-21: qty 1

## 2024-06-21 MED ORDER — IOHEXOL 300 MG/ML  SOLN
60.0000 mL | Freq: Once | INTRAMUSCULAR | Status: AC | PRN
  Administered 2024-06-21: 60 mL via INTRAVENOUS

## 2024-06-21 MED ORDER — ONDANSETRON HCL 4 MG/2ML IJ SOLN
4.0000 mg | Freq: Once | INTRAMUSCULAR | Status: AC
  Administered 2024-06-21: 4 mg via INTRAVENOUS
  Filled 2024-06-21: qty 2

## 2024-06-21 MED ORDER — DICLOFENAC SODIUM 1 % EX GEL: 2.0000 g | Freq: Four times a day (QID) | CUTANEOUS | 0 refills | Status: AC | PRN

## 2024-06-21 MED ORDER — IOHEXOL 300 MG/ML  SOLN
100.0000 mL | Freq: Once | INTRAMUSCULAR | Status: AC | PRN
  Administered 2024-06-21: 75 mL via INTRAVENOUS

## 2024-06-21 MED ORDER — OXYCODONE-ACETAMINOPHEN 5-325 MG PO TABS
1.0000 | ORAL_TABLET | Freq: Four times a day (QID) | ORAL | 0 refills | Status: AC | PRN
Start: 2024-06-21 — End: ?

## 2024-06-21 NOTE — ED Provider Notes (Signed)
 Emergency Department Provider Note   I have reviewed the triage vital signs and the nursing notes.   HISTORY  Chief Complaint Fall (Rib pain)  Nepali audio interpreter used for this encounter.  HPI Yolanda [name] is a 89 y.o. female with past history reviewed below including prior CVA presents to the emergency department with left-sided rib and abdominal pain after a fall down 3 steps 2 days ago.  Fall was mechanical.  Patient describes some head contact but denies severe head injury or loss of consciousness.  She is mainly having left chest wall and rib pain.  No numbness or weakness.  No shortness of breath.  No cough or congestion.  Past Medical History:  Diagnosis Date   Closed fracture of left proximal humerus 03/27/2021   Closed fracture of lower end of left radius with routine healing 10/31/2020   Dementia (HCC)    Mild intermittent asthma without complication 04/04/2019   OSTEOARTHRITIS 08/04/2010   Annotation: Bilateral knees and ankles Qualifier: Diagnosis of  By: Adella MD, Elizabeth     Stroke [facility])     Review of Systems  Constitutional: No fever/chills Cardiovascular: Positive left chest pain. Respiratory: Denies shortness of breath. Gastrointestinal: Positive left abdominal pain.  No nausea, no vomiting.   Musculoskeletal: Negative for back pain. Skin: Negative for rash. Neurological: Negative for headaches.   ____________________________________________   PHYSICAL EXAM:  VITAL SIGNS: ED Triage Vitals  Encounter Vitals Group     BP 06/21/24 0921 (!) 147/93     Pulse Rate 06/21/24 0921 79     Resp 06/21/24 0921 20     Temp 06/21/24 0921 97.8 F (36.6 C)     Temp Source 06/21/24 0921 Oral     SpO2 06/21/24 0921 97 %     Weight 06/21/24 0922 130 lb (59 kg)   Constitutional: Alert and oriented. Well appearing and in no acute distress. Eyes: Conjunctivae are normal.  Head: Atraumatic. Nose: No congestion/rhinnorhea. Mouth/Throat: Mucous  membranes are moist.  Neck: No stridor.  Cardiovascular: Normal rate, regular rhythm. Good peripheral circulation. Grossly normal heart sounds.   Respiratory: Normal respiratory effort.  No retractions. Lungs CTAB. Gastrointestinal: Soft with point tenderness to the left upper and mid abdomen.  Nontender to palpation on the right. no distention.  Musculoskeletal: No lower extremity tenderness nor edema. No gross deformities of extremities.  Tenderness to palpation over the left chest wall. Neurologic:  Normal speech and language. No gross focal neurologic deficits are appreciated.  Skin:  Skin is warm, dry and intact. No rash noted.   ____________________________________________   LABS (all labs ordered are listed, but only abnormal results are displayed)  Labs Reviewed  COMPREHENSIVE METABOLIC PANEL WITH GFR - Abnormal; Notable for the following components:      Result Value   Glucose, Bld 119 (*)    All other components within normal limits  CBC WITH DIFFERENTIAL/PLATELET - Abnormal; Notable for the following components:   Abs Immature Granulocytes 0.15 (*)    All other components within normal limits  TROPONIN T, HIGH SENSITIVITY   ____________________________________________  EKG   EKG Interpretation Date/Time:  Sunday June 21 2024 09:23:29 EDT Ventricular Rate:  81 PR Interval:  153 QRS Duration:  94 QT Interval:  398 QTC Calculation: 462 R Axis:   72  Text Interpretation: Sinus rhythm  Nonspecific ST changes Confirmed by Darra Chew [phone]) on 06/21/2024 9:56:01 AM        ____________________________________________  RADIOLOGY  CT Chest W  Contrast Result Date: 06/21/2024 CLINICAL DATA:  Blunt chest trauma, fall, left-sided rib pain. EXAM: CT CHEST WITH CONTRAST TECHNIQUE: Multidetector CT imaging of the chest was performed during intravenous contrast administration. RADIATION DOSE REDUCTION: This exam was performed according to the departmental  dose-optimization program which includes automated exposure control, adjustment of the mA and/or kV according to patient size and/or use of iterative reconstruction technique. CONTRAST:  60mL OMNIPAQUE  IOHEXOL  300 MG/ML SOLN, 75mL OMNIPAQUE  IOHEXOL  300 MG/ML SOLN COMPARISON:  08/07/2020. FINDINGS: Cardiovascular: Atherosclerotic calcification of the aorta. Heart is enlarged. No pericardial effusion. Mediastinum/Nodes: Thyroid nodules measure up to 1.4 cm. No follow-up recommended. (Ref: J Am Coll Radiol. 2015 Feb;12(2): 143-50).No pathologically enlarged mediastinal, hilar or axillary lymph nodes. Esophagus is grossly unremarkable. Lungs/Pleura: Image quality is degraded by expiratory phase imaging and respiratory motion. Chronic atelectasis in the right middle lobe, adjacent to an elevated right hemidiaphragm. No pleural fluid. Airway is unremarkable. Upper Abdomen: Liver may be slightly decreased in attenuation diffusely. Cholecystectomy. Subcentimeter low-attenuation lesion in the right kidney, too small to characterize. No specific follow-up necessary. Visualized portions of the liver, adrenal glands, kidneys, spleen, pancreas, stomach and bowel are otherwise grossly unremarkable. No upper abdominal adenopathy. Musculoskeletal: Nondisplaced fractures of the lateral left third, fifth, seventh, eighth and posterior tenth ribs. Multiple osteoporotic compression fractures. IMPRESSION: 1. At least 5 nondisplaced left rib fractures. 2. Multiple osteoporotic compression fractures. 3. Liver may be mildly steatotic. 4.  Aortic atherosclerosis (ICD10-I70.0). Electronically Signed   By: Newell Eke M.D.   On: 06/21/2024 13:05   CT ABDOMEN PELVIS W CONTRAST Result Date: 06/21/2024 EXAM: CT ABDOMEN AND PELVIS WITH CONTRAST 06/21/2024 12:16:14 PM TECHNIQUE: CT of the abdomen and pelvis was performed with the administration of 75mL of intravenous iohexol  (OMNIPAQUE ) 300 MG/ML solution. Multiplanar reformatted images are  provided for review. Automated exposure control, iterative reconstruction, and/or weight-based adjustment of the mA/kV was utilized to reduce the radiation dose to as low as reasonably achievable. COMPARISON: 04/04/2022 CLINICAL HISTORY: Polytrauma, blunt. FINDINGS: LOWER CHEST: Atelectasis noted within the right middle lobe and lingula. Mildly displaced acute fractures involving the lateral aspect of the left fifth and seventh ribs and posterior left 11th rib. LIVER: No focal liver abnormality. GALLBLADDER AND BILE DUCTS: Status post cholecystectomy. Increased caliber of the common bile duct measures 9 mm and is unchanged from previous exam. No calcified stones noted within the common bile duct. SPLEEN: Normal size. No focal lesion. PANCREAS: No mass. No ductal dilatation. ADRENAL GLANDS: Normal adrenal glands. 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 Bosniak class 1 cyst arises off the inferior pole of the right kidney measuring 7.7 cm, image 42/2. No follow-up imaging recommended No stones in the kidneys or ureters. No hydronephrosis. No perinephric or periureteral stranding. Urinary bladder is unremarkable. GI AND BOWEL: Normal appendix. Stomach demonstrates no acute abnormality. There is no bowel obstruction. No bowel wall thickening. PERITONEUM AND RETROPERITONEUM: No ascites. No free air. VASCULATURE: Aortic atherosclerosis. LYMPH NODES: No abdominal or pelvic lymph node enlargement. REPRODUCTIVE ORGANS: Uterus and adnexal structures appear normal. BONES AND SOFT TISSUES: Chronic thoracic compression deformities are identified. Compared with the previous exam there has been interval progression of the T7 and T8 compression deformities. Mild multilevel compression deformities involving the lumbar spine appear unchanged. No focal soft tissue abnormality. IMPRESSION: 1. Mildly displaced fractures involving the lateral aspect of the left fifth and seventh ribs and posterior left  11th rib. 2. Chronic multilevel thoracic and lumbar spine compression deformities.  When compared with the exam from 04/04/2022. There has been interval progression of the T7 and T8 compression fractures. Electronically signed by: Waddell Calk MD 06/21/2024 12:33 PM EDT RP Workstation: [id number]   CT Cervical Spine Wo Contrast Result Date: 06/21/2024 CLINICAL DATA:  89 year old female status post fall down stairs. Pain. On blood thinners. 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 EXAM: CT CERVICAL SPINE WITHOUT CONTRAST TECHNIQUE: Multidetector CT imaging of the cervical spine was performed without intravenous contrast. Multiplanar CT image reconstructions were also generated. RADIATION DOSE REDUCTION: This exam was performed according to the departmental dose-optimization program which includes automated exposure control, adjustment of the mA and/or kV according to patient size and/or use of iterative reconstruction technique. COMPARISON:  Head CT today reported separately. Cervical spine CT 03/20/2021. CTA head and neck 07/06/2023. FINDINGS: Alignment: Chronic straightening of cervical lordosis. Cervicothoracic junction alignment is within normal limits. Bilateral posterior element alignment is within normal limits. Skull base and vertebrae: Osteopenia. Visualized skull base is intact. No atlanto-occipital dissociation. C1 and C2 appear intact and aligned. No acute osseous abnormality identified. Soft tissues and spinal canal: No prevertebral fluid or swelling. No visible canal hematoma. Partially retropharyngeal carotids, mild for age carotid calcified atherosclerosis. Disc levels:  Normal for age. Upper chest: Calcified aortic atherosclerosis. Negative visible lung apices. Osteopenia. Visible upper thoracic vertebrae appear stable since last year, including T3 vertebral body hemangioma (normal variant) IMPRESSION: 1. Osteopenia. No acute traumatic injury identified in the cervical  spine. 2.  Aortic Atherosclerosis (ICD10-I70.0). Electronically Signed   By: VEAR Hurst M.D.   On: 06/21/2024 12:33   CT Head Wo Contrast Result Date: 06/21/2024 CLINICAL DATA:  89 year old female status post fall down stairs. Pain. On blood thinners. History of giant (2 cm) MCA aneurysm treated with endovascular embolization. EXAM: CT HEAD WITHOUT CONTRAST TECHNIQUE: Contiguous axial images were obtained from the base of the skull through the vertex without intravenous contrast. RADIATION DOSE REDUCTION: This exam was performed according to the departmental dose-optimization program which includes automated exposure control, adjustment of the mA and/or kV according to patient size and/or use of iterative reconstruction technique. COMPARISON:  Head CT and  Brain MRI 07/06/2023. FINDINGS: Brain: Large area of left MCA endovascular coil pack with extensive streak artifact, stable. Progressive encephalomalacia in the left MCA anterior and middle division territory since 2024, appears chronic with associated ex vacuo enlargement of the left lateral ventricle since that time. No superimposed midline shift, mass effect, evidence of mass lesion, acute intracranial hemorrhage or evidence of cortically based acute infarction. Vascular: Large left MCA region endovascular coil pack. Calcified atherosclerosis at the skull base. Skull: Stable and intact. Sinuses/Orbits: Visualized paranasal sinuses and mastoids are stable and well aerated. Other: No acute orbit or scalp soft tissue injury identified. IMPRESSION: 1. No acute intracranial abnormality or acute traumatic injury identified. 2. Treated Giant Left MCA aneurysm with progressive encephalomalacia in that vascular territories since last year. Electronically Signed   By: VEAR Hurst M.D.   On: 06/21/2024 12:30   DG Ribs Unilateral W/Chest Left Result Date: 06/21/2024 CLINICAL DATA:  89 year old female status post fall 2 days ago with left side with rib and chest pain. EXAM:  LEFT RIBS AND CHEST - 3+ VIEW COMPARISON:  Portable chest 07/08/2023 and earlier. FINDINGS: AP view 1006 hours. Chronic cardiomegaly, tortuous thoracic aorta. Calcified aortic atherosclerosis. Stable cardiac size and mediastinal contours. Visualized tracheal air column is within normal limits. Improved lung volumes from last year. No pneumothorax or pleural  effusion. Chronic elevation of the right hemidiaphragm anteriorly. Stable cholecystectomy clips. Negative visible bowel gas. Two oblique views of the left ribs with mild motion artifact. Anterior left 8th or 9th rib marked as the area of concern. Osteopenia. Some chronic anterior left rib fractures were demonstrated on CT in 2021. Chronic thoracic compression fractures also again evident. And there is a left lateral 8th rib fracture visible on image #2. This appears mildly angulated. No other acute rib fracture identified. IMPRESSION: 1. Acute left lateral 8th rib fracture, mildly angulated. Underlying osteopenia and chronic left rib fractures, chronic thoracic compression fractures. 2. No acute cardiopulmonary abnormality. Chronic cardiomegaly and tortuous thoracic aorta. Electronically Signed   By: VEAR Hurst M.D.   On: 06/21/2024 10:23    ____________________________________________   PROCEDURES  Procedure(s) performed:   Procedures  None  ____________________________________________   INITIAL IMPRESSION / ASSESSMENT AND PLAN / ED COURSE  Pertinent labs & imaging results that were available during my care of the patient were reviewed by me and considered in my medical decision making (see chart for details).   This patient is Presenting for Evaluation of CP/fall, which does require a range of treatment options, and is a complaint that involves a high risk of morbidity and mortality.  The Differential Diagnoses includes but is not exclusive to acute coronary syndrome, aortic dissection, pulmonary embolism, cardiac tamponade,  community-acquired pneumonia, pericarditis, musculoskeletal chest wall pain, etc.   Critical Interventions-    Medications  fentaNYL  (SUBLIMAZE ) injection 25 mcg (25 mcg Intravenous Given 06/21/24 1027)  ondansetron  (ZOFRAN ) injection 4 mg (4 mg Intravenous Given 06/21/24 1027)  iohexol  (OMNIPAQUE ) 300 MG/ML solution 100 mL (75 mLs Intravenous Contrast Given 06/21/24 1216)  iohexol  (OMNIPAQUE ) 300 MG/ML solution 60 mL (60 mLs Intravenous Contrast Given 06/21/24 1233)    Reassessment after intervention:  pain improved.    I did obtain Additional Historical Information from son at bedside.    Clinical Laboratory Tests Ordered, included CBC without anemia. CMP without AKI. Troponin negative.   Radiologic Tests Ordered, included CT head, c spine, chest, abdomen, pelvis. I independently interpreted the images and agree with radiology interpretation.   Cardiac Monitor Tracing which shows NSR.    Social Determinants of Health Risk patient is a non-smoker.   Medical Decision Making: Summary:  Patient presents emergency department for evaluation after fall down multiple stairs 2 days ago.  Med list, reviewed with son shows she is on a baby aspirin .  I do not see other anticoagulant.  She does have tenderness to the left lateral ribs and more focal tenderness in her left abdomen.  Plan for CT imaging with language barrier and patient's age.   Reevaluation with update and discussion with patient and family at bedside using the Nepali interpreter by phone.  We discussed the multiple rib fractures but no internal injuries or bleeding.  Patient states that she has a lot of family support including members at bedside.  We discussed being admitted for pain management and PT/OT but patient declines.  She would like to return home with incentive spirometry, pain management, and PCP follow-up plan.   Considered admission but patient declined admit.   Patient's presentation is most consistent with acute  presentation with potential threat to life or bodily function.   Disposition: discharge  ____________________________________________  FINAL CLINICAL IMPRESSION(S) / ED DIAGNOSES  Final diagnoses:  Fall, initial encounter  Closed fracture of multiple ribs of left side, initial encounter     NEW OUTPATIENT MEDICATIONS STARTED DURING  THIS VISIT:  Discharge Medication List as of 06/21/2024  1:46 PM     START taking these medications   Details  diclofenac  Sodium (VOLTAREN ) 1 % GEL Apply 2 g topically 4 (four) times daily as needed., Starting Sun 06/21/2024, Normal    oxyCODONE -acetaminophen  (PERCOCET/ROXICET) 5-325 MG tablet Take 1 tablet by mouth every 6 (six) hours as needed for severe pain (pain score 7-10)., Starting Sun 06/21/2024, Normal    senna-docusate (SENOKOT-S) 8.6-50 MG tablet Take 1 tablet by mouth at bedtime as needed for mild constipation., Starting Sun 06/21/2024, Normal        Note:  This document was prepared using Dragon voice recognition software and may include unintentional dictation errors.  Fonda Law, MD, [facility]    Jacobb Alen, Fonda MATSU, MD 06/22/24 [phone]

## 2024-06-21 NOTE — ED Notes (Signed)
 Unable to coordinate IS maneuver, breathing through nose.  Given nose clip to help with incentive at home.  Family instructed and expressed understanding.

## 2024-06-21 NOTE — ED Notes (Signed)
Patient transported to CT, XR 

## 2024-06-21 NOTE — ED Triage Notes (Signed)
 Nepali speaking  Son reports slipping down stairs (3 steps) 2 days ago. Reports L sided rib pain. Reports SHOB when breathing. Denies chest pain   Son reports pt is on blood thinners. Denies head injury or LOC. No signs of respiratory distress upon assessment

## 2024-06-21 NOTE — Discharge Instructions (Signed)
 Return with any new or suddenly worsening symptoms.

## 2024-06-21 NOTE — ED Notes (Signed)
 Patient transported to CT
